# Patient Record
Sex: Female | Born: 1955
Health system: Southern US, Community
[De-identification: ages and names within clinical notes are randomized; demographics above are authoritative.]

## PROBLEM LIST (undated history)

## (undated) DIAGNOSIS — E079 Disorder of thyroid, unspecified: Secondary | ICD-10-CM

## (undated) DIAGNOSIS — D649 Anemia, unspecified: Secondary | ICD-10-CM

## (undated) DIAGNOSIS — Z9889 Other specified postprocedural states: Secondary | ICD-10-CM

## (undated) DIAGNOSIS — I493 Ventricular premature depolarization: Secondary | ICD-10-CM

## (undated) DIAGNOSIS — M1990 Unspecified osteoarthritis, unspecified site: Secondary | ICD-10-CM

## (undated) DIAGNOSIS — R112 Nausea with vomiting, unspecified: Secondary | ICD-10-CM

## (undated) DIAGNOSIS — M797 Fibromyalgia: Secondary | ICD-10-CM

## (undated) DIAGNOSIS — L309 Dermatitis, unspecified: Secondary | ICD-10-CM

## (undated) DIAGNOSIS — J45909 Unspecified asthma, uncomplicated: Secondary | ICD-10-CM

## (undated) DIAGNOSIS — E039 Hypothyroidism, unspecified: Secondary | ICD-10-CM

## (undated) DIAGNOSIS — C801 Malignant (primary) neoplasm, unspecified: Secondary | ICD-10-CM

## (undated) DIAGNOSIS — M810 Age-related osteoporosis without current pathological fracture: Secondary | ICD-10-CM

## (undated) DIAGNOSIS — G709 Myoneural disorder, unspecified: Secondary | ICD-10-CM

## (undated) DIAGNOSIS — E785 Hyperlipidemia, unspecified: Secondary | ICD-10-CM

## (undated) HISTORY — PX: APPENDECTOMY: SHX54

## (undated) HISTORY — DX: Unspecified asthma, uncomplicated: J45.909

## (undated) HISTORY — DX: Anemia, unspecified: D64.9

## (undated) HISTORY — DX: Unspecified osteoarthritis, unspecified site: M19.90

## (undated) HISTORY — PX: FOOT SURGERY: SHX648

## (undated) HISTORY — DX: Age-related osteoporosis without current pathological fracture: M81.0

## (undated) HISTORY — PX: BREAST SURGERY: SHX581

## (undated) HISTORY — DX: Myoneural disorder, unspecified: G70.9

## (undated) HISTORY — DX: Disorder of thyroid, unspecified: E07.9

## (undated) HISTORY — DX: Malignant (primary) neoplasm, unspecified: C80.1

## (undated) HISTORY — PX: OTHER SURGICAL HISTORY: SHX169

## (undated) HISTORY — DX: Hyperlipidemia, unspecified: E78.5

## (undated) HISTORY — DX: Dermatitis, unspecified: L30.9

## (undated) HISTORY — DX: Ventricular premature depolarization: I49.3

---

## 1988-02-03 DIAGNOSIS — C801 Malignant (primary) neoplasm, unspecified: Secondary | ICD-10-CM

## 1988-02-03 HISTORY — PX: BREAST SURGERY: SHX581

## 1988-02-03 HISTORY — DX: Malignant (primary) neoplasm, unspecified: C80.1

## 1997-11-14 ENCOUNTER — Other Ambulatory Visit: Admission: RE | Admit: 1997-11-14 | Discharge: 1997-11-14 | Payer: Self-pay | Admitting: Obstetrics and Gynecology

## 1998-10-28 ENCOUNTER — Other Ambulatory Visit: Admission: RE | Admit: 1998-10-28 | Discharge: 1998-10-28 | Payer: Self-pay | Admitting: Obstetrics and Gynecology

## 1999-03-24 ENCOUNTER — Ambulatory Visit (HOSPITAL_COMMUNITY): Admission: RE | Admit: 1999-03-24 | Discharge: 1999-03-24 | Payer: Self-pay | Admitting: Gastroenterology

## 2000-12-07 ENCOUNTER — Other Ambulatory Visit: Admission: RE | Admit: 2000-12-07 | Discharge: 2000-12-07 | Payer: Self-pay | Admitting: Obstetrics and Gynecology

## 2001-04-18 ENCOUNTER — Encounter: Admission: RE | Admit: 2001-04-18 | Discharge: 2001-04-18 | Payer: Self-pay | Admitting: Oncology

## 2001-10-28 ENCOUNTER — Ambulatory Visit (HOSPITAL_COMMUNITY): Admission: RE | Admit: 2001-10-28 | Discharge: 2001-10-28 | Payer: Self-pay | Admitting: Surgery

## 2001-10-28 ENCOUNTER — Encounter: Payer: Self-pay | Admitting: Surgery

## 2002-03-20 ENCOUNTER — Other Ambulatory Visit: Admission: RE | Admit: 2002-03-20 | Discharge: 2002-03-20 | Payer: Self-pay | Admitting: Obstetrics and Gynecology

## 2002-04-19 ENCOUNTER — Encounter: Admission: RE | Admit: 2002-04-19 | Discharge: 2002-04-19 | Payer: Self-pay | Admitting: Oncology

## 2002-11-21 ENCOUNTER — Encounter: Admission: RE | Admit: 2002-11-21 | Discharge: 2002-11-21 | Payer: Self-pay | Admitting: Obstetrics and Gynecology

## 2002-11-21 ENCOUNTER — Encounter: Payer: Self-pay | Admitting: Obstetrics and Gynecology

## 2003-04-18 ENCOUNTER — Encounter: Admission: RE | Admit: 2003-04-18 | Discharge: 2003-04-18 | Payer: Self-pay | Admitting: Oncology

## 2003-05-24 ENCOUNTER — Other Ambulatory Visit: Admission: RE | Admit: 2003-05-24 | Discharge: 2003-05-24 | Payer: Self-pay | Admitting: Obstetrics and Gynecology

## 2003-10-24 ENCOUNTER — Ambulatory Visit (HOSPITAL_COMMUNITY): Admission: RE | Admit: 2003-10-24 | Discharge: 2003-10-24 | Payer: Self-pay | Admitting: Family Medicine

## 2003-11-16 ENCOUNTER — Ambulatory Visit (HOSPITAL_COMMUNITY): Admission: RE | Admit: 2003-11-16 | Discharge: 2003-11-16 | Payer: Self-pay | Admitting: Family Medicine

## 2003-12-12 ENCOUNTER — Ambulatory Visit: Payer: Self-pay | Admitting: Internal Medicine

## 2004-03-14 ENCOUNTER — Encounter: Admission: RE | Admit: 2004-03-14 | Discharge: 2004-03-14 | Payer: Self-pay | Admitting: Obstetrics and Gynecology

## 2004-03-14 ENCOUNTER — Encounter: Admission: RE | Admit: 2004-03-14 | Discharge: 2004-03-14 | Payer: Self-pay | Admitting: Specialist

## 2005-02-03 ENCOUNTER — Encounter: Admission: RE | Admit: 2005-02-03 | Discharge: 2005-02-03 | Payer: Self-pay | Admitting: Oncology

## 2005-02-03 ENCOUNTER — Ambulatory Visit (HOSPITAL_COMMUNITY): Payer: Self-pay | Admitting: Oncology

## 2005-09-23 ENCOUNTER — Encounter: Admission: RE | Admit: 2005-09-23 | Discharge: 2005-09-23 | Payer: Self-pay | Admitting: Orthopedic Surgery

## 2006-01-28 ENCOUNTER — Encounter: Admission: RE | Admit: 2006-01-28 | Discharge: 2006-01-28 | Payer: Self-pay | Admitting: Rheumatology

## 2006-07-20 ENCOUNTER — Encounter: Admission: RE | Admit: 2006-07-20 | Discharge: 2006-07-20 | Payer: Self-pay | Admitting: Family Medicine

## 2006-07-28 ENCOUNTER — Encounter: Payer: Self-pay | Admitting: Obstetrics and Gynecology

## 2006-07-28 ENCOUNTER — Encounter (INDEPENDENT_AMBULATORY_CARE_PROVIDER_SITE_OTHER): Payer: Self-pay | Admitting: Surgery

## 2006-07-28 ENCOUNTER — Inpatient Hospital Stay (HOSPITAL_COMMUNITY): Admission: EM | Admit: 2006-07-28 | Discharge: 2006-07-30 | Payer: Self-pay | Admitting: Emergency Medicine

## 2007-04-02 ENCOUNTER — Encounter: Admission: RE | Admit: 2007-04-02 | Discharge: 2007-04-02 | Payer: Self-pay | Admitting: Orthopedic Surgery

## 2008-01-04 ENCOUNTER — Encounter: Admission: RE | Admit: 2008-01-04 | Discharge: 2008-01-04 | Payer: Self-pay | Admitting: Orthopedic Surgery

## 2010-02-23 ENCOUNTER — Encounter: Payer: Self-pay | Admitting: Rheumatology

## 2010-02-23 ENCOUNTER — Encounter: Payer: Self-pay | Admitting: Surgery

## 2010-06-17 NOTE — Op Note (Signed)
[name], [name]              ACCOUNT NO.:  [id number]   MEDICAL RECORD NO.:  [id number]          PATIENT TYPE:  INP   LOCATION:  5729                         FACILITY:  [facility]   PHYSICIAN:  Thornton Park. Daphine Deutscher, MD  DATE OF BIRTH:  January 13, 1956   DATE OF PROCEDURE:  07/28/2006  DATE OF DISCHARGE:                               OPERATIVE REPORT   PREOPERATIVE DIAGNOSIS:  Acute appendicitis   POSTOPERATIVE DIAGNOSIS:  Acute retrocecal appendicitis with perforation  in tip.   SURGEON:  Thornton Park. Daphine Deutscher, M.D.   ANESTHESIA:  General endotracheal.   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  I pulled this up on the screen in room  16 where we were operating and could see a fecalith and rim enhancement  of a thickened wall.  In retrospect, this was a retrocecal appendix.  I  entered the abdomen using Hassan technique through the umbilicus which  previously had scar from a laparoscopy.  An oblique 11 was placed  laterally, avoiding the tattoo in the left lower quadrant.  In the site  of the right upper quadrant trocar, there was a little mole that had  some irregularities so I went ahead and excised a little ellipse and  used that as my site for my 5 mm trocar.  The abdomen was entered and  insufflated.  In the cecal region, there was a bulging yellow density  extra retroperitoneal.  I found the base of the appendix with some  difficulty because of the degree of inflammation but went ahead and did  that and mobilized it and finally got an angled stapler across the base  and fired that.  I then elevated this, identifying the mesentery which I  transected with the harmonic scalpel.  As I teased it away, you could  see pus dripping out of the tip and I used the suction to control that  and aspirated so that it did not spill.  Once detached and freed up, I  placed it in a bag and brought it out through the umbilicus.  I  irrigated the base very well and did not see any evidence of any further  bleeding.  I used a couple of liters of saline to irrigate and removed  the irrigant from the pelvis as well as from around the liver.  I then  repaired the umbilical defect with two sutures of 0 zero Vicryl using  the 30 degree scope from the left lower quadrant port and repaired with  two sutures of 0 Vicryl.  I then surveyed the abdomen.  I deflated the  abdomen, removed the ports, and closed all wounds with 4-0 Vicryl  subcutaneously and subcuticularly.  Benzoin and Steri-Strips used the  skin.  The patient seemed to tolerate the procedure well  and was taken  to the recovery room in satisfactory condition.  She will be admitted  for observation.      Thornton Park Daphine Deutscher, MD  Electronically Signed  MBM/MEDQ  D:  07/28/2006  T:  07/29/2006  Job:  [id number]

## 2010-06-17 NOTE — H&P (Signed)
NAME:  Michelle Gray, Michelle Gray NO.:  [id number]   MEDICAL RECORD NO.:  [id number]          PATIENT TYPE:  INP   LOCATION:  2550                         FACILITY:  [facility]   PHYSICIAN:  Daphine Deutscher, M.D.           DATE OF BIRTH:  04/29/1955   DATE OF ADMISSION:  07/28/2006  DATE OF DISCHARGE:                              HISTORY & PHYSICAL   CHIEF COMPLAINT/REASON FOR ADMISSION:  Michelle Gray is a 55 year old female  patient complaining of right lower quadrant pain.   HISTORY OF PRESENT ILLNESS:  She presented to [facility] with the  abrupt onset of right lower quadrant pain, constant with colicky  features.  No fever.  No nausea and vomiting.  She had some Darvocet,  which she took that had no relief in her pain.  She had 2 bowel  movements this morning, which were normal.  She presented to the ER.  Evaluation there revealed a normal white count and a normal neutrophil  count, but because of her clinical presentation consistent with  appendicitis, a CT of the abdomen and pelvis was ordered and this was  consistent with acute appendicitis without evidence of abscess.  The  patient was subsequently transferred to [facility] ER with acceptance by Dr.  Daphine Deutscher for a diagnosis of appendicitis.   PAST MEDICAL HISTORY:  Right breast cancer.   PAST SURGICAL HISTORY:  A diagnostic laparoscopy 25 years ago and then a  right mastectomy with a subsequent implant 18 years ago, Dr. Daphine Deutscher did  the mastectomy.   FAMILY HISTORY:  Noncontributory.   SOCIAL HISTORY:  No tobacco.  Occasional social alcohol.  She works as a  Scientific laboratory technician.   DRUG ALLERGIES:  COMPAZINE, THIS CAUSED EXTRAPYRAMIDAL SYMPTOMS AND THIS  WAS NOTICED WHILE PATIENT WAS RECEIVING HIGH DOSES OF COMPAZINE DURING  HER CHEMOTHERAPY.   CURRENT MEDICATIONS:  Include:  1. Darvocet 1 every 6 hours as needed.  2. Ambien 10 mg as needed at bedtime.  3. Celebrex 200 mg daily as needed for arthritis  pain.  4. Multivitamins daily.   REVIEW OF SYSTEMS:  As per the history of present illness.   PHYSICAL EXAMINATION:  GENERAL:  She is a pleasant female patient,  currently complaining of significant right lower quadrant abdominal  pain.  VITAL SIGNS:  Temp 97.8.  BP 108/63.  Pulse 76.  Respirations 20.  NEURO:  Alert and oriented x3.  Moving all extremities x4 with no focal  deficits.  HEENT:  Head normocephalic.  Sclerae noninjected.  NECK:  Supple.  No adenopathy.  CHEST:  Bilateral lung fields clear to auscultation.  Respiratory effort  is nonlabored.  She is on room air.  CARDIAC:  S1, S2 without rubs, murmurs, thrills or gallops.  Pulse is  regular.  BREASTS EXAM:  Deferred.  ABDOMEN:  Soft, slightly distended.  Nontender.  Emergency room exam  stated that abdomen is soft, slightly distended, bowel sounds are  present.  She is tender in the right lower quadrant.  Both McBurney's  point with noted guarding and minimal rebounding.  EXTREMITIES:  Symmetrical in appearance without edema, cyanosis or  clubbing.  Pulses are palpable.   LAB AND X-RAY:  White count 9700, neutrophils 74%, hemoglobin 13.6,  platelets 272,000.  CT of abdomen and pelvis reveals acute appendicitis  without evidence of abscess/   IMPRESSION:  Acute appendicitis.   PLAN:  1. Admit to [facility].  2. Operative intervention this afternoon when operating room suite and      surgeon available.  Hopefully to follow the patient current in      progress per Dr. Daphine Deutscher.  She will undergo a laparoscopic      appendectomy, possible open procedure if indicated.  3. We will keep her n.p.o., begin IV fluid hydration, morphine for      pain, Zofran for nausea.  Begin empiric Zosyn antibiotic therapy.  4. Because the patient's age we will get a preop EKG and chest x-ray      and if she has not had a BMET done, we will check one of those.  5. Any additional recommendations per Dr. Daphine Deutscher.  Risks and benefits       have been further explained by Dr. Daphine Deutscher upon his arrival.      Revonda Standard L. Rolene Course.    ______________________________  Daphine Deutscher, M.D.    ALE/MEDQ  D:  07/28/2006  T:  07/28/2006  Job:  [id number]

## 2010-06-20 NOTE — Procedures (Signed)
[place]  Patient:    Michelle Gray, Michelle Gray                     MRN: [id number] Proc. Date: 03/24/99 Adm. Date:  [id number] Attending:  Rich Brave CC:         Elvina Sidle, M.D.             Charlaine Dalton. Sherene Sires, M.D. [facility]             Eric S. Mariel Sleet, M.D.                           Procedure Report  PROCEDURE PERFORMED:  Colonoscopy with biopsies.  ENDOSCOPIST:  Florencia Reasons, M.D.  INDICATIONS:  The patient is a 55 year old with family history of colon polyps nd personal history of breast cancer, who has had occasional rectal bleeding with bowel movements.  FINDINGS:  Diminutive cecal polyp.  DESCRIPTION OF PROCEDURE:  The nature, purpose and risks of the procedure were familiar to the patient from prior examinations and she provided written consent. This procedure was performed immediately following her upper endoscopy and total sedation for the two procedures was fentanyl 90 mcg and Versed 8 mg IV without arrhythmias or desaturation.  Digital exam and perianal inspection were unremarkable.  The Olympus adjustable  tension pediatric video colonoscope was advanced very easily around the colon to the cecum, using the tension device on the scope and a little bit of external abdominal compression to facilitate reaching the cecum.  Pullback was then performed.  The cecum was identified by clear visualization of the appendiceal orifice.  The quality of the prep was excellent and it is felt that all areas were adequately seen.  There was a 2 to 3 mm sessile polyp removed by a couple of cold biopsies in the  cecal region but no other polyps were seen and there was no evidence of cancer,  colitis, vascular malformations or diverticular disease.  Retroflexion in the rectum was attempted but could not readily be accomplished due to what appeared to be overlying valves of [place].  Antegrade viewing of the distal rectum  and anal canal, however, was unremarkable, with just mild to moderate hemorrhoidal columns being present.  The patient tolerated the procedure well.  There were no apparent complications.  IMPRESSION:  Diminutive cecal polyp, pathology pending.  PLAN:  Consider follow-up colonoscopy in five years. DD:  03/24/99 TD:  03/24/99 Job: [id number]

## 2010-06-20 NOTE — Procedures (Signed)
[place]  Patient:    Michelle Gray, Michelle Gray                     MRN: [id number] Proc. Date: 03/24/99 Adm. Date:  [id number] Attending:  Rich Brave CC:         Charlaine Dalton. Sherene Sires, M.D. [facility]             Elvina Sidle, M.D., [facility]             Ladona Horns. Mariel Sleet, M.D.                           Procedure Report  PROCEDURE:  Upper endoscopy.  INDICATION:  [age]-year-old with presumed laryngopharyngeal reflux as a complication of GERD, manifested by recurrent throat clearing.  No heartburn.  FINDINGS:  Essentially normal exam.  Intermittent hiatal hernia.  PROCEDURE:  The nature, purpose, and risks of the procedure had been discussed ith the patient, who provided written consent.  Sedation was fentanyl 90 mcg and versed 8 mg IV, without arrhythmias or desaturation.  The Olympus small-caliber adult videoendoscope was passed easily under direct vision.  The vocal cords looked normal.  I did not see any obvious laryngeal inflammatory changes grossly.  The  esophagus was easily entered, and had entirely normal mucosa, specifically, without evidence of reflux esophagitis, Barretts esophagus, varices, impaction, or neoplasia.  No gastroesophageal reflux was noted during the exam.  There was a spontaneously-reducing 1-2 cm hiatal hernia intermittently present during her exam.  The stomach was entered.  It contained no significant residual and had normal mucosa without evidence of gastritis, erosions, ulcers, polyps, or masses, including a ______ of the proximal stomach, which showed just a slight gap around the scope consistent with a small hiatal hernia.  The pylorus, duodenal bulb and sac, and duodenum looked normal.  The scope was removed from the patient.  No biopsies were obtained.  The patient tolerated the procedure well.  There were no apparent complications.  IMPRESSION:  Small hiatal hernia, otherwise  normal endoscopy, without obvious markers of reflux disease.  PLAN:  Follow-up per Dr. Sherene Sires for possible laryngopharyngeal reflux (LPR). DD:  03/24/99 TD:  03/24/99 Job: [id number]

## 2010-06-20 NOTE — Discharge Summary (Signed)
Michelle Gray, Michelle Gray              ACCOUNT NO.:  [id number]   MEDICAL RECORD NO.:  [id number]          PATIENT TYPE:  INP   LOCATION:  5729                         FACILITY:  [facility]   PHYSICIAN:  Thornton Park. Daphine Deutscher, MD  DATE OF BIRTH:  November 08, 1955   DATE OF ADMISSION:  07/28/2006  DATE OF DISCHARGE:  07/30/2006                               DISCHARGE SUMMARY   CHIEF COMPLAINT/REASON FOR ADMISSION:  [name] is a 50-year female  patient who developed right lower quadrant pain who presented initially  to [facility].  Pain has been constant with colicky features.  In  the ER at [facility], her white count was normal.  She had a  normal neutrophil count, but a CT scan revealed acute appendicitis  without evidence of abscess.  The patient was transferred over to [facility] and accepted by Dr. Daphine Deutscher. On clinical exam, the patient  was afebrile, vital signs were stable.  Her abdomen was soft, slightly  distended but nontender except for tenderness in the right lower  quadrant.  The patient was admitted with a diagnosis of acute  appendicitis.   HOSPITAL COURSE:  The patient was admitted and taken to the OR.  She had  been placed on empiric Zosyn preoperatively, and this was continued in  the postoperative period.  She subsequently underwent laparoscopic  appendectomy for perforated appendicitis.  Review of the OP notes and  the intraoperative radiographs reveal a small perforation at the base of  the appendix.   By postoperative day #2, the patient was febrile.  Vital signs were  stable.  Her most recent white count 24 hours before had been 13,100.  She was tolerating a solid diet.  Her pain was controlled, and the  patient reported that she wanted to go home.  Bowel sounds were present,  and she was only tender over the incision sites without any drainage.  She was deemed appropriate for discharge home on antibiotics but  recommended that she follow with Dr. Daphine Deutscher in  1 week.   DISCHARGE DIAGNOSIS:  Acute perforated appendicitis status post  laparoscopic appendectomy.   DISCHARGE MEDICATIONS:  1. Ambien 10 mg at hour of sleep.  2. Celebrex; hold while taking any ibuprofen.  3. Asthma inhaler.  4. Other medications:  Augmentin 875 mg b.i.d. for 7 days.  5. Refill on the albuterol metered-dose inhaler.  6. Ibuprofen 800 mg t.i.d. p.r.n. pain with food.  7. Darvocet-N 100 1-2 tablets every 4-6 hours as needed for pain.  8. Protonix or Prilosec over-the-counter while taking ibuprofen.   ADDITIONAL INSTRUCTIONS:  Increase activity slowly.  May walk up steps.  Shower for the next 2 weeks.  No lifting greater than 15 pounds for the  next 2 weeks.  No driving while taking any Percocet or Darvocet.  Return  to work no sooner than 2 weeks; note attached.  Diet:  No restrictions.  Wound care:  Allow Steri-Strips to fall off.  Followup:  You need to  call Dr. Ermalene Searing office to be seen in the next 7-10 days.   Additional instructions:  You  are to call the surgeon if you have fever  by mouth greater than 101 degrees Fahrenheit, new or increased belly  pain, nausea, vomiting, diarrhea or redness or drainage from your  wounds.      Allison L. Gwyneth Sprout Daphine Deutscher, MD  Electronically Signed    ALE/MEDQ  D:  10/12/2006  T:  10/13/2006  Job:  [id number]

## 2010-11-19 LAB — URINALYSIS, ROUTINE W REFLEX MICROSCOPIC
Hgb urine dipstick: NEGATIVE
Ketones, ur: NEGATIVE
Nitrite: NEGATIVE
Protein, ur: NEGATIVE
Specific Gravity, Urine: <1.005 — ABNORMAL LOW
pH: 5.5

## 2010-11-19 LAB — CBC
HCT: 40
Hemoglobin: 12.2
Hemoglobin: 13.6
MCHC: 34
Platelets: 272
RDW: 12.7
WBC: 9.7

## 2010-11-19 LAB — DIFFERENTIAL
Eosinophils Absolute: 0
Monocytes Absolute: 1.1 — ABNORMAL HIGH
Neutrophils Relative %: 74

## 2010-12-11 ENCOUNTER — Encounter: Payer: Self-pay | Admitting: Sports Medicine

## 2010-12-11 ENCOUNTER — Ambulatory Visit (INDEPENDENT_AMBULATORY_CARE_PROVIDER_SITE_OTHER): Payer: BC Managed Care – PPO | Admitting: Sports Medicine

## 2010-12-11 VITALS — BP 113/78 | HR 76 | Ht 67.5 in | Wt 170.0 lb

## 2010-12-11 DIAGNOSIS — M722 Plantar fascial fibromatosis: Secondary | ICD-10-CM

## 2010-12-11 DIAGNOSIS — M76899 Other specified enthesopathies of unspecified lower limb, excluding foot: Secondary | ICD-10-CM

## 2010-12-11 DIAGNOSIS — M7060 Trochanteric bursitis, unspecified hip: Secondary | ICD-10-CM

## 2010-12-11 DIAGNOSIS — G5700 Lesion of sciatic nerve, unspecified lower limb: Secondary | ICD-10-CM

## 2010-12-11 NOTE — Patient Instructions (Signed)
Please try increasing your gabapentin to 300 mg in the morning and 600 mg qhs for the next 3 days, then increase to 300 mg in the morning, 300 mg midday, and 600 mg qhs.  Try arch straps and using your orthotics   Start exercises for plantar fasciitis and hip  Please follow up in 1 month

## 2010-12-11 NOTE — Progress Notes (Signed)
Subjective:    Patient ID: Michelle Gray, female    DOB: 1956-02-03, 55 y.o.   MRN: [id number]  HPI  Ms. Fassler is a pleasant 55 yo female patient complaining of B/l plantar fascitis and L hip pain. She has a long Hx of B/L plantar fascitis for at least 7 year. She has had different modalities of treatment ,stretches, wave shock therapy, several cortisone injection in both feet, orthotics, surgical right gastroc release. Her pain is at the plantar aspect of the heel, B/L worse in the right heel, on and off, worse at evening time after she has been on her feet the whole day. She has tried different types of shoes and she uses the ones that are more comfortable for her. She is mobic and tylenol.  Also complaining of L hip pain for at least 2 years, she denies any injuries to her hip, she has seen a chiropractor in th past that helped her with her hip pain. Her pain is located in the posterior hip, in her left gluteal area, deep pain, dull, 3/10, on and off, worse with activities, no radiated, no numbness or tingling distally. No LBP. Also lateral hip pain in the great trochanter area this is a more recent pain.  There is no problem list on file for this patient.  No current outpatient prescriptions on file prior to visit.   Allergies  Allergen Reactions  . Compazine      Review of Systems  Constitutional: Negative for chills, diaphoresis and fatigue.  Musculoskeletal: Positive for gait problem. Negative for myalgias, back pain and joint swelling.  Skin: Negative for color change, pallor, rash and wound.  Neurological: Negative for weakness and numbness.       Objective:   Physical Exam  Constitutional: She is oriented to person, place, and time. She appears well-developed and well-nourished.       BP 113/78  Pulse 76  Ht 5' 7.5" (1.715 m)  Wt 170 lb (77.111 kg)  BMI 26.23 kg/m2   Pulmonary/Chest: Effort normal.  Musculoskeletal:       Low back with intact skin. No swelling, no  hematomas. FROM for flexion, extension, rotation and lateralization.   No Tenderness to palpation on SI joint area B/L. Faber test  Negative for SI joint pain. Straight leg raise negative B/L. Strength 5/5 for hip flexion and extension, 5/5 for knee flexion and extension, 5/5 for ankle plantar and dorsal flexion B/L. DTR patellar and achilles II/IV B/L Sensation intact distally B/L. No leg discrepancy.  Left hip with FROM . TTP in the posterior deep gluteal area. TTP on the trochanteric bursa. No limping. Sensation intact distally  No pain with resisted abduction.   Neurological: She is alert and oriented to person, place, and time.  Skin: Skin is warm. No rash noted. No erythema. No pallor.  Psychiatric: She has a normal mood and affect.     MSK U/S: with a swelling of PF B/L 0.6 cm B/L, bone spur in the right, calcification in the left.      Assessment & Plan:   1. Plantar fasciitis   2. Pyriformis syndrome   3. Trochanteric bursitis    Start gabapentin to 300 mg in the morning and 600 mg qhs for the next 3 days, then increase to 300 mg in the morning, 300 mg midday, and 600 mg qhs. Start arch straps and using your orthotics.  Start exercises for plantar fasciitis and hip. Ice massage in the plantar fascia. Deep  massage in the right gluteal area. Abductor strengthening exercises. We will can try trochanteric cortisone injection on her next visit if her symptoms do not improve.  Please follow up in 1 month

## 2011-01-12 ENCOUNTER — Ambulatory Visit (INDEPENDENT_AMBULATORY_CARE_PROVIDER_SITE_OTHER): Payer: BC Managed Care – PPO | Admitting: Sports Medicine

## 2011-01-12 ENCOUNTER — Other Ambulatory Visit: Payer: Self-pay | Admitting: *Deleted

## 2011-01-12 VITALS — BP 121/70

## 2011-01-12 DIAGNOSIS — M722 Plantar fascial fibromatosis: Secondary | ICD-10-CM | POA: Insufficient documentation

## 2011-01-12 DIAGNOSIS — M25559 Pain in unspecified hip: Secondary | ICD-10-CM

## 2011-01-12 DIAGNOSIS — M25552 Pain in left hip: Secondary | ICD-10-CM | POA: Insufficient documentation

## 2011-01-12 NOTE — Progress Notes (Signed)
  Subjective:    Patient ID: Michelle Gray, female    DOB: [birth year], 55 y.o.   MRN: [id number]  HPI  Pt presents to clinic for f/u of L hip pain which is 15-20% improved, and and bilat PF which is about the same.  On gabapentin and working on increasing to 600 mg during the day and 600 qhs.  Seeing improvement after doing suggested hip exercises.  Bilat PF- has not improved with HEP, icing, arch strap.  She gets relief with Birkie sandals that have a built in orthotic  We suggested a trial of orthotics in work shoes if not getting enough relief She does find arch strap helps Review of Systems     Objective:   Physical Exam  Lt hip exam: Lt hip in lying position- 70 deg ER, 30 deg IR Nml flexion lt hip  Hip flexion strong Abduction strong Hip rotation strong TTP over piriformis and greater troch TTP over upper glut medius  Rt great toe motion good No nodules TTP at PF insertion on medial side  Not tender over os calsus   Lt great toe motion good No nodules TTP at PF insertion on medial side Not tender over os calsus         Assessment & Plan:

## 2011-01-12 NOTE — Assessment & Plan Note (Signed)
Tough chronic problem Keep up stretches and exercises Wear new orthotics as much as tolerated  Patient was fitted for a standard, cushioned, semi-rigid orthotic.  The orthotic was heated and the patient stood on the orthotic blank positioned on the orthotic stand. The patient was positioned in subtalar neutral position and 10 degrees of ankle dorsiflexion in a weight bearing stance. After molding, a stable Fast-Tech EVA base was applied to the orthotic blank.   The blank was ground to a stable position for weight bearing. Size: 8 blue swirl base: Blue EVA posting: left first ray additional orthotic padding: none Time 40 mins  Reck P 3 mos

## 2011-01-12 NOTE — Assessment & Plan Note (Signed)
Keep up exercises  Try to cont on higher dose of gabapentin and we can increase if pain does not respond Walking gait improved on orthotic but watch turnout of RT foot

## 2011-06-18 ENCOUNTER — Encounter (INDEPENDENT_AMBULATORY_CARE_PROVIDER_SITE_OTHER): Payer: Self-pay

## 2011-10-15 ENCOUNTER — Other Ambulatory Visit: Payer: Self-pay | Admitting: Gastroenterology

## 2012-04-14 ENCOUNTER — Encounter (INDEPENDENT_AMBULATORY_CARE_PROVIDER_SITE_OTHER): Payer: Self-pay

## 2012-06-23 ENCOUNTER — Other Ambulatory Visit: Payer: Self-pay | Admitting: Dermatology

## 2013-04-17 ENCOUNTER — Telehealth: Payer: Self-pay | Admitting: Genetic Counselor

## 2013-04-17 NOTE — Telephone Encounter (Signed)
Patient left VM message that she wanted to cancel appointment.

## 2013-04-19 ENCOUNTER — Encounter: Payer: BC Managed Care – PPO | Admitting: Genetic Counselor

## 2013-04-19 ENCOUNTER — Other Ambulatory Visit: Payer: BC Managed Care – PPO

## 2013-05-24 ENCOUNTER — Encounter (INDEPENDENT_AMBULATORY_CARE_PROVIDER_SITE_OTHER): Payer: Self-pay

## 2013-05-24 ENCOUNTER — Telehealth: Payer: Self-pay | Admitting: Genetic Counselor

## 2013-05-24 NOTE — Telephone Encounter (Signed)
PER PATIENT WILL CALL BACK TO SCHEDULE GENETIC APPT

## 2013-05-24 NOTE — Telephone Encounter (Signed)
LEFT MESSAGE FOR PATIENT TO RETURN CALL TO SCHEDULE GENETIC APPT.  °

## 2013-07-19 ENCOUNTER — Other Ambulatory Visit: Payer: Self-pay | Admitting: Dermatology

## 2013-08-03 ENCOUNTER — Ambulatory Visit (INDEPENDENT_AMBULATORY_CARE_PROVIDER_SITE_OTHER): Payer: BC Managed Care – PPO | Admitting: Internal Medicine

## 2013-08-03 VITALS — BP 119/75 | HR 128 | Temp 98.8°F | Resp 18 | Ht 67.5 in | Wt 172.0 lb

## 2013-08-03 DIAGNOSIS — R509 Fever, unspecified: Secondary | ICD-10-CM

## 2013-08-03 LAB — POCT INFLUENZA A/B
INFLUENZA A, POC: NEGATIVE
Influenza B, POC: NEGATIVE

## 2013-08-03 MED ORDER — AZITHROMYCIN 250 MG PO TABS: ORAL_TABLET | ORAL | Status: DC

## 2013-08-03 MED ORDER — PREDNISONE 20 MG PO TABS: ORAL_TABLET | ORAL | Status: DC

## 2013-08-03 NOTE — Progress Notes (Signed)
   Subjective:    Patient ID: Loraine Grip, female    DOB: October 09, 1955, 58 y.o.   MRN: [id number]  Fever  Associated symptoms include coughing and headaches. Pertinent negatives include no abdominal pain, chest pain, ear pain, nausea or sore throat.  Cough Associated symptoms include a fever and headaches. Pertinent negatives include no chest pain, ear pain or sore throat.  Headache  Associated symptoms include coughing and a fever. Pertinent negatives include no abdominal pain, back pain, ear pain, nausea or sore throat.   Chief Complaint  Patient presents with  . Fever    x2 days   . Nasal Congestion  . Cough  . Headache  . Generalized Body Aches   She has underlying asthma and has started wheezing with this illness Fever to 102 She only has albuterol and has needed this  Review of Systems  Constitutional: Positive for fever. Negative for appetite change.  HENT: Negative for ear pain, mouth sores and sore throat.   Respiratory: Positive for cough.   Cardiovascular: Negative for chest pain and palpitations.  Gastrointestinal: Negative for nausea and abdominal pain.  Genitourinary: Negative for dysuria.  Musculoskeletal: Negative for back pain.  Skin:       No history of tick bite  Neurological: Positive for headaches.       Objective:   Physical Exam BP 119/75  Pulse 128  Temp(Src) 98.8 F (37.1 C) (Oral)  Resp 18  Ht 5' 7.5" (1.715 m)  Wt 172 lb (78.019 kg)  BMI 26.53 kg/m2  SpO2 95% Conjunctiva clear/TMs clear/nares with slight clear discharge Throat not injected No nodes Chest with wheezing on forced expiration bilaterally/no rales or rhonchi   Flu Swab negative    Assessment & Plan:   lower respiratory infection with reactive airway  Meds ordered this encounter  Medications  . azithromycin (ZITHROMAX) 250 MG tablet    Sig: As packaged    Dispense:  6 tablet    Refill:  0  . predniSONE (DELTASONE) 20 MG tablet    Sig: 3/3/2/2/1/1 single daily  dose for 6 days    Dispense:  12 tablet    Refill:  0   She has questionable association of glaucoma changes with Advair or Symbicort which resolved after discontinuing the inhaler/she will followup with ophthalmology while on steroids

## 2013-08-06 ENCOUNTER — Ambulatory Visit (HOSPITAL_COMMUNITY): Payer: BC Managed Care – PPO

## 2013-08-06 ENCOUNTER — Ambulatory Visit (INDEPENDENT_AMBULATORY_CARE_PROVIDER_SITE_OTHER): Payer: BC Managed Care – PPO

## 2013-08-06 ENCOUNTER — Ambulatory Visit (INDEPENDENT_AMBULATORY_CARE_PROVIDER_SITE_OTHER): Payer: BC Managed Care – PPO | Admitting: Internal Medicine

## 2013-08-06 VITALS — BP 128/72 | HR 114 | Temp 98.5°F | Resp 20 | Ht 67.0 in | Wt 174.4 lb

## 2013-08-06 DIAGNOSIS — J45909 Unspecified asthma, uncomplicated: Secondary | ICD-10-CM | POA: Insufficient documentation

## 2013-08-06 DIAGNOSIS — R071 Chest pain on breathing: Secondary | ICD-10-CM

## 2013-08-06 DIAGNOSIS — E785 Hyperlipidemia, unspecified: Secondary | ICD-10-CM

## 2013-08-06 DIAGNOSIS — R0602 Shortness of breath: Secondary | ICD-10-CM

## 2013-08-06 DIAGNOSIS — E039 Hypothyroidism, unspecified: Secondary | ICD-10-CM | POA: Insufficient documentation

## 2013-08-06 DIAGNOSIS — G4700 Insomnia, unspecified: Secondary | ICD-10-CM

## 2013-08-06 DIAGNOSIS — J4520 Mild intermittent asthma, uncomplicated: Secondary | ICD-10-CM

## 2013-08-06 LAB — CBC WITH DIFFERENTIAL/PLATELET
Basophils Absolute: 0 10*3/uL (ref 0.0–0.1)
Basophils Relative: 0 % (ref 0–1)
EOS ABS: 0 10*3/uL (ref 0.0–0.7)
EOS PCT: 0 % (ref 0–5)
HCT: 37.7 % (ref 36.0–46.0)
HEMOGLOBIN: 13.4 g/dL (ref 12.0–15.0)
Lymphocytes Relative: 8 % — ABNORMAL LOW (ref 12–46)
Lymphs Abs: 1.3 10*3/uL (ref 0.7–4.0)
MCH: 33.9 pg (ref 26.0–34.0)
MCHC: 35.5 g/dL (ref 30.0–36.0)
MCV: 95.4 fL (ref 78.0–100.0)
MONOS PCT: 11 % (ref 3–12)
Monocytes Absolute: 1.8 10*3/uL — ABNORMAL HIGH (ref 0.1–1.0)
NEUTROS PCT: 81 % — AB (ref 43–77)
Neutro Abs: 13.6 10*3/uL — ABNORMAL HIGH (ref 1.7–7.7)
PLATELETS: 391 10*3/uL (ref 150–400)
RBC: 3.95 MIL/uL (ref 3.87–5.11)
RDW: 13 % (ref 11.5–15.5)
WBC: 16.8 10*3/uL — ABNORMAL HIGH (ref 4.0–10.5)

## 2013-08-06 LAB — D-DIMER, QUANTITATIVE (NOT AT ARMC): D-Dimer, Quant: 0.46 ug/mL-FEU (ref 0.00–0.48)

## 2013-08-06 NOTE — Progress Notes (Signed)
Subjective:    Patient ID: Michelle Gray, female    DOB: October 28, 1955, 58 y.o.   MRN: [id number]  HPI This chart was scribed for Tami Lin, MD by Irene Pap, ED Scribe. This patient was seen in room 1 and the patient's care was started at 12:42 PM.  HPI Comments: Michelle Gray is a 58 y.o. female who presents to the [facility] complaining of cough and constant chest pain onset one day ago. Has previously been seen for the cough, but deep chest pain and tightness started last night.  She states that she would wake up due to the chest pain and start coughing after she rolls over in her sleep The pain is worsened by deep breathing and cough Reports some sputum with cough Last used Symbicort at 7, albuterol at 8 Reports SOB in last 24 hours Reports low grade fever at 99.8 F this morning Denies diaphoresis after sleep Denies SOB when walking Can tell when there's active wheezing after the inhalers wear off Reports some tachycardia, which she believes may be from albuterol Denies history of heart problems and HTN Has a history of GERD for which she has taken Nexium Reports having an EKG last fall that came back negative   Patient Active Problem List   Diagnosis Date Noted  . Unspecified hypothyroidism 08/06/2013  . Other and unspecified hyperlipidemia 08/06/2013  . Insomnia 08/06/2013  . Intrinsic asthma 08/06/2013  . Hip pain, left 01/12/2011  . Plantar fasciitis 01/12/2011     Past Medical History  Diagnosis Date  . Anemia   . Asthma   . Arthritis   . Cancer   . Hyperlipidemia   . Neuromuscular disorder   . Thyroid disease    Past Surgical History  Procedure Laterality Date  . Appendectomy    . Breast surgery     Allergies  Allergen Reactions  . Compazine    Prior to Admission medications   Medication Sig Start Date End Date Taking? Authorizing Provider  albuterol (PROVENTIL HFA;VENTOLIN HFA) 108 (90 BASE) MCG/ACT inhaler  Inhale 2 puffs into the lungs every 6 (six) hours as needed for wheezing or shortness of breath.   Yes Historical Provider, MD  atorvastatin (LIPITOR) 10 MG tablet Take 10 mg by mouth daily. 01/09/11  Yes Historical Provider, MD  azithromycin (ZITHROMAX) 250 MG tablet As packaged 08/03/13  Yes Leandrew Koyanagi, MD  Biotin 5000 MCG CAPS Take 5,000 mcg by mouth daily.   Yes Historical Provider, MD  Coenzyme Q10 (COQ10 PO) Take 1 tablet by mouth daily.     Yes Historical Provider, MD  folic acid (FOLVITE) 093 MCG tablet Take 400 mcg by mouth daily.   Yes Historical Provider, MD  gabapentin (NEURONTIN) 300 MG capsule Take 600 mg by mouth At bedtime. 11/15/10  Yes Historical Provider, MD  Glucosamine 500 MG TABS Take 1,500 mg by mouth daily.     Yes Historical Provider, MD  levothyroxine (SYNTHROID, LEVOTHROID) 50 MCG tablet Take 50 mcg by mouth every other day.     Yes Historical Provider, MD  levothyroxine (SYNTHROID, LEVOTHROID) 75 MCG tablet Take 75 mcg by mouth every other day.     Yes Historical Provider, MD  Magnesium 500 MG CAPS Take 500 mg by mouth daily.     Yes Historical Provider, MD  meloxicam (MOBIC) 15 MG tablet Take 15 mg by mouth At bedtime. 11/15/10  Yes Historical Provider, MD  methocarbamol (ROBAXIN) 500 MG tablet Take 500  mg by mouth as needed. 09/04/10  Yes Historical Provider, MD  Misc Natural Products (TART CHERRY ADVANCED PO) Take by mouth.   Yes Historical Provider, MD  montelukast (SINGULAIR) 10 MG tablet Take 10 mg by mouth at bedtime.   Yes Historical Provider, MD  Omega-3 Fatty Acids (OMEGA 3 PO) Take 300 mg by mouth daily.     Yes Historical Provider, MD  predniSONE (DELTASONE) 20 MG tablet 3/3/2/2/1/1 single daily dose for 6 days 08/03/13  Yes Leandrew Koyanagi, MD  rosuvastatin (CRESTOR) 10 MG tablet Take 10 mg by mouth 2 (two) times a week.   Yes Historical Provider, MD  zolpidem (AMBIEN) 10 MG tablet Take 10 mg by mouth At bedtime. 11/24/10  Yes Historical Provider, MD    History   Social History  . Marital Status: Single    Spouse Name: N/A    Number of Children: N/A  . Years of Education: N/A   Occupational History  . Not on file.   Social History Main Topics  . Smoking status: Never Smoker   . Smokeless tobacco: Never Used  . Alcohol Use: 1.5 - 2.0 oz/week    3-4 drink(s) per week  . Drug Use: No  . Sexual Activity: Not on file   Other Topics Concern  . Not on file   Social History Narrative  . No narrative on file      Review of Systems  Respiratory: Positive for cough, shortness of breath and wheezing.   Cardiovascular: Positive for chest pain.       Objective:   Physical Exam  Constitutional: She appears well-developed and well-nourished. No distress.  Neck: No thyromegaly present.  Cardiovascular: Normal rate, regular rhythm and normal heart sounds.  Exam reveals no gallop.   No murmur heard. Pulmonary/Chest: Breath sounds normal. She has no wheezes. She has no rales. She exhibits no tenderness.  Appears apprehensive when taking a deep breath and deep breathing produces deep chest pain anteriorly.   Musculoskeletal: She exhibits no edema.  Lymphadenopathy:    She has no cervical adenopathy.  BP 128/72  Pulse 114  Temp(Src) 98.5 F (36.9 C) (Oral)  Resp 20  Ht 5\' 7"  (1.702 m)  Wt 174 lb 6.4 oz (79.107 kg)  BMI 27.31 kg/m2  SpO2 98% Rate 75 on exam and 79 on EKG EKG-WNL   [facility] reading (PRIMARY) by  Dr. Moosa Bueche=R sided infiltr obscures R Ht Border=RML infiltr  Clips from node dissection 25 yrs ago  ?LLL sl infiltr as well       Assessment & Plan:  I have completed the patient encounter in its entirety as documented by the scribe, with editing by me where necessary. Jewell Haught P. Laney Pastor, M.D. Chest pain on breathing =pleuritic pain due to infection  Intrinsic asthma, mild intermittent, uncomplicated-w/ exacerbation due to illn  SOB (shortness of breath) - secondary   Will do cbc/d-dimer to assess  further Continue meds/add ibuprof    addend2130hrs D-dimer neg WBC 16,000- Called pt--no chg in plan/call me tues if not better

## 2016-01-03 ENCOUNTER — Ambulatory Visit: Payer: Self-pay | Admitting: Rheumatology

## 2016-02-06 DIAGNOSIS — L821 Other seborrheic keratosis: Secondary | ICD-10-CM | POA: Diagnosis not present

## 2016-02-06 DIAGNOSIS — D2262 Melanocytic nevi of left upper limb, including shoulder: Secondary | ICD-10-CM | POA: Diagnosis not present

## 2016-02-06 DIAGNOSIS — Z85828 Personal history of other malignant neoplasm of skin: Secondary | ICD-10-CM | POA: Diagnosis not present

## 2016-02-21 ENCOUNTER — Telehealth: Payer: Self-pay | Admitting: Rheumatology

## 2016-02-21 ENCOUNTER — Ambulatory Visit (INDEPENDENT_AMBULATORY_CARE_PROVIDER_SITE_OTHER): Payer: 59 | Admitting: Rheumatology

## 2016-02-21 ENCOUNTER — Encounter: Payer: Self-pay | Admitting: Rheumatology

## 2016-02-21 VITALS — BP 108/60 | HR 76 | Resp 12 | Ht 67.5 in | Wt 149.0 lb

## 2016-02-21 DIAGNOSIS — M797 Fibromyalgia: Secondary | ICD-10-CM

## 2016-02-21 DIAGNOSIS — M25561 Pain in right knee: Secondary | ICD-10-CM | POA: Diagnosis not present

## 2016-02-21 DIAGNOSIS — M19041 Primary osteoarthritis, right hand: Secondary | ICD-10-CM

## 2016-02-21 DIAGNOSIS — E785 Hyperlipidemia, unspecified: Secondary | ICD-10-CM | POA: Insufficient documentation

## 2016-02-21 DIAGNOSIS — M19042 Primary osteoarthritis, left hand: Secondary | ICD-10-CM

## 2016-02-21 DIAGNOSIS — F5101 Primary insomnia: Secondary | ICD-10-CM

## 2016-02-21 DIAGNOSIS — M25512 Pain in left shoulder: Secondary | ICD-10-CM | POA: Diagnosis not present

## 2016-02-21 DIAGNOSIS — J45909 Unspecified asthma, uncomplicated: Secondary | ICD-10-CM

## 2016-02-21 DIAGNOSIS — M810 Age-related osteoporosis without current pathological fracture: Secondary | ICD-10-CM | POA: Diagnosis not present

## 2016-02-21 DIAGNOSIS — Z8639 Personal history of other endocrine, nutritional and metabolic disease: Secondary | ICD-10-CM

## 2016-02-21 DIAGNOSIS — Z853 Personal history of malignant neoplasm of breast: Secondary | ICD-10-CM

## 2016-02-21 DIAGNOSIS — G8929 Other chronic pain: Secondary | ICD-10-CM

## 2016-02-21 DIAGNOSIS — M722 Plantar fascial fibromatosis: Secondary | ICD-10-CM

## 2016-02-21 MED ORDER — GABAPENTIN 300 MG PO CAPS: 600.0000 mg | ORAL_CAPSULE | Freq: Every day | ORAL | 1 refills | Status: DC

## 2016-02-21 MED ORDER — MELOXICAM 15 MG PO TABS: 15.0000 mg | ORAL_TABLET | Freq: Every day | ORAL | 1 refills | Status: DC

## 2016-02-21 MED ORDER — ZOLPIDEM TARTRATE 10 MG PO TABS: 10.0000 mg | ORAL_TABLET | Freq: Every evening | ORAL | 1 refills | Status: DC | PRN

## 2016-02-21 MED ORDER — METHOCARBAMOL 500 MG PO TABS: 500.0000 mg | ORAL_TABLET | Freq: Two times a day (BID) | ORAL | 1 refills | Status: DC | PRN

## 2016-02-21 NOTE — Telephone Encounter (Signed)
I have called in for her to [facility]

## 2016-02-21 NOTE — Progress Notes (Signed)
Office Visit Note  Patient: Michelle Gray             Date of Birth: 1955/04/23           MRN: [id number]             PCP: Cari Caraway, MD Referring: Cari Caraway, MD Visit Date: 02/21/2016 Occupation: @GUAROCC @    Subjective: left shoulder pain   History of Present Illness: Michelle Gray is a 61 y.o. female with history of fibromyalgia syndrome and osteoarthritis. She states she well off the stairs in October. After that she acquired left shoulder joint discomfort and had a cortisone injection by Dr. Lynann Bologna which is not better so far. She's been also having some discomfort in her right knee joint. She had a prednisone Dosepak which did not help her she is planning to get a cortisone injection to her right knee. The plantar fasciitis is the same. She's been diagnosed with osteoporosis recently and has a started taking Fosamax for that.  Activities of Daily Living:  Patient reports morning stiffness for 5 minutes.   Patient Reports nocturnal pain.  Difficulty dressing/grooming: Denies Difficulty climbing stairs: Reports Difficulty getting out of chair: Reports Difficulty using hands for taps, buttons, cutlery, and/or writing: Denies   Review of Systems  Constitutional: Negative for fatigue, night sweats, weight gain, weight loss and weakness.  HENT: Positive for mouth dryness and nose dryness. Negative for mouth sores, trouble swallowing and trouble swallowing.   Eyes: Positive for dryness. Negative for pain, redness and visual disturbance.  Respiratory: Negative for cough, shortness of breath and difficulty breathing.   Cardiovascular: Negative for chest pain, palpitations, hypertension, irregular heartbeat and swelling in legs/feet.  Gastrointestinal: Negative for blood in stool, constipation and diarrhea.  Endocrine: Negative for increased urination.  Genitourinary: Negative for vaginal dryness.  Musculoskeletal: Positive for arthralgias, joint pain, myalgias, morning  stiffness and myalgias. Negative for joint swelling, muscle weakness and muscle tenderness.  Skin: Negative for color change, rash, hair loss, skin tightness, ulcers and sensitivity to sunlight.  Allergic/Immunologic: Negative for susceptible to infections.  Neurological: Negative for dizziness, memory loss and night sweats.  Hematological: Negative for swollen glands.  Psychiatric/Behavioral: Positive for sleep disturbance. Negative for depressed mood. The patient is not nervous/anxious.     PMFS History:  Patient Active Problem List   Diagnosis Date Noted  . Primary osteoarthritis of both hands 02/21/2016  . Fibromyalgia 02/21/2016  . History of hypothyroidism 02/21/2016  . Dyslipidemia 02/21/2016  . History of breast cancer 02/21/2016  . Age-related osteoporosis without current pathological fracture 02/21/2016  . Unspecified hypothyroidism 08/06/2013  . Other and unspecified hyperlipidemia 08/06/2013  . Insomnia 08/06/2013  . Intrinsic asthma 08/06/2013  . Hip pain, left 01/12/2011  . Plantar fasciitis 01/12/2011    Past Medical History:  Diagnosis Date  . Anemia   . Arthritis   . Asthma   . Cancer ([place])   . Hyperlipidemia   . Neuromuscular disorder ([place])   . Thyroid disease     Family History  Problem Relation Age of Onset  . Cancer Mother   . Hyperlipidemia Mother   . Hypertension Mother   . Stroke Mother   . Hyperlipidemia Father   . Hypertension Brother    Past Surgical History:  Procedure Laterality Date  . APPENDECTOMY    . BREAST SURGERY     Social History   Social History Narrative  . No narrative on file     Objective: Vital Signs: BP  108/60 (BP Location: Left Arm, Patient Position: Sitting, Cuff Size: Normal)   Pulse 76   Resp 12   Ht 5' 7.5" (1.715 m)   Wt 149 lb (67.6 kg)   BMI 22.99 kg/m    Physical Exam  Constitutional: She is oriented to person, place, and time. She appears well-developed and well-nourished.  HENT:  Head:  Normocephalic and atraumatic.  Eyes: Conjunctivae and EOM are normal.  Neck: Normal range of motion.  Cardiovascular: Normal rate, regular rhythm, normal heart sounds and intact distal pulses.   Pulmonary/Chest: Effort normal and breath sounds normal.  Abdominal: Soft. Bowel sounds are normal.  Lymphadenopathy:    She has no cervical adenopathy.  Neurological: She is alert and oriented to person, place, and time.  Skin: Skin is warm and dry. Capillary refill takes less than 2 seconds.  Psychiatric: She has a normal mood and affect. Her behavior is normal.  Nursing note and vitals reviewed.    Musculoskeletal Exam: C-spine and thoracic lumbar spine good range of motion. She had discomfort with external rotation of her left shoulder. Elbow joints wrist joint MCPs PIPs DIPs with good range of motion with no synovitis. Hip joints knee joints ankles MTPs PIPs with good range of motion with no synovitis. She complains of some discomfort in her right knee. No synovitis was noted on exam. Fibromyalgia tender points were then out of 18 positive.  CDAI Exam: No CDAI exam completed.    Investigation: No additional findings.   Imaging: No results found.  Speciality Comments: No specialty comments available.    Procedures:  No procedures performed Allergies: Compazine   Assessment / Plan:     Visit Diagnoses: Fibromyalgia: She does have some generalized pain and discomfort from fibromyalgia. I will refill Neurontin and Robaxin which is been helpful.  Plantar fasciitis: Doing better with proper fitting shoes  Primary insomnia: She does have chronic insomnia which is better with Ambien.  Primary osteoarthritis of both hands: She is some discomfort in her bilateral hands.  Left shoulder joint tendinopathy: She had inadequate response to cortisone injection. She had some exercises from her previous physical therapy of advised her to do those exercises at home.  Right knee joint  discomfort: She gives history of some instability. She's been seeing Dr. Lynann Bologna for this. I've given her some knee joint exercises to try. She also takes Mobic for which she requests refills I will refill that today. She will get some labs to her PCPs office which include CBC and CMP to monitor for long-term medication use.  History of hypothyroidism  Dyslipidemia  Intrinsic asthma  History of breast cancer - 1990, status post right mastectomy and chemotherapy  Age-related osteoporosis without current pathological fracture - Fosamax by Dr. Benjie Karvonen    Orders: No orders of the defined types were placed in this encounter.  Meds ordered this encounter  Medications  . zolpidem (AMBIEN) 10 MG tablet    Sig: Take 1 tablet (10 mg total) by mouth at bedtime as needed for sleep.    Dispense:  90 tablet    Refill:  1  . methocarbamol (ROBAXIN) 500 MG tablet    Sig: Take 1 tablet (500 mg total) by mouth 2 (two) times daily as needed.    Dispense:  180 tablet    Refill:  1  . meloxicam (MOBIC) 15 MG tablet    Sig: Take 1 tablet (15 mg total) by mouth daily.    Dispense:  90 tablet  Refill:  1  . gabapentin (NEURONTIN) 300 MG capsule    Sig: Take 2 capsules (600 mg total) by mouth at bedtime.    Dispense:  180 capsule    Refill:  1    Face-to-face time spent with patient was 30 minutes. 50% of time was spent in counseling and coordination of care.  Follow-Up Instructions: Return in about 6 months (around 08/20/2016) for fibromyalgia, Osteoarthritis.   Bo Merino, MD  Note - This record has been created using Editor, commissioning.  Chart creation errors have been sought, but may not always  have been located. Such creation errors do not reflect on  the standard of medical care.

## 2016-02-21 NOTE — Patient Instructions (Signed)
Knee Exercises Ask your health care provider which exercises are safe for you. Do exercises exactly as told by your health care provider and adjust them as directed. It is normal to feel mild stretching, pulling, tightness, or discomfort as you do these exercises, but you should stop right away if you feel sudden pain or your pain gets worse.Do not begin these exercises until told by your health care provider. STRETCHING AND RANGE OF MOTION EXERCISES  These exercises warm up your muscles and joints and improve the movement and flexibility of your knee. These exercises also help to relieve pain, numbness, and tingling. Exercise A: Knee Extension, Prone 1. Lie on your abdomen on a bed. 2. Place your left / right knee just beyond the edge of the surface so your knee is not on the bed. You can put a towel under your left / right thigh just above your knee for comfort. 3. Relax your leg muscles and allow gravity to straighten your knee. You should feel a stretch behind your left / right knee. 4. Hold this position for __________ seconds. 5. Scoot up so your knee is supported between repetitions. Repeat __________ times. Complete this stretch __________ times a day. Exercise B: Knee Flexion, Active  1. Lie on your back with both knees straight. If this causes back discomfort, bend your left / right knee so your foot is flat on the floor. 2. Slowly slide your left / right heel back toward your buttocks until you feel a gentle stretch in the front of your knee or thigh. 3. Hold this position for __________ seconds. 4. Slowly slide your left / right heel back to the starting position. Repeat __________ times. Complete this exercise __________ times a day. Exercise C: Quadriceps, Prone  1. Lie on your abdomen on a firm surface, such as a bed or padded floor. 2. Bend your left / right knee and hold your ankle. If you cannot reach your ankle or pant leg, loop a belt around your foot and grab the belt  instead. 3. Gently pull your heel toward your buttocks. Your knee should not slide out to the side. You should feel a stretch in the front of your thigh and knee. 4. Hold this position for __________ seconds. Repeat __________ times. Complete this stretch __________ times a day. Exercise D: Hamstring, Supine 1. Lie on your back. 2. Loop a belt or towel over the ball of your left / right foot. The ball of your foot is on the walking surface, right under your toes. 3. Straighten your left / right knee and slowly pull on the belt to raise your leg until you feel a gentle stretch behind your knee.  Do not let your left / right knee bend while you do this.  Keep your other leg flat on the floor. 4. Hold this position for __________ seconds. Repeat __________ times. Complete this stretch __________ times a day. STRENGTHENING EXERCISES  These exercises build strength and endurance in your knee. Endurance is the ability to use your muscles for a long time, even after they get tired. Exercise E: Quadriceps, Isometric  1. Lie on your back with your left / right leg extended and your other knee bent. Put a rolled towel or small pillow under your knee if told by your health care provider. 2. Slowly tense the muscles in the front of your left / right thigh. You should see your kneecap slide up toward your hip or see increased dimpling just above the  knee. This motion will push the back of the knee toward the floor. 3. For __________ seconds, keep the muscle as tight as you can without increasing your pain. 4. Relax the muscles slowly and completely. Repeat __________ times. Complete this exercise __________ times a day. Exercise F: Straight Leg Raises - Quadriceps 1. Lie on your back with your left / right leg extended and your other knee bent. 2. Tense the muscles in the front of your left / right thigh. You should see your kneecap slide up or see increased dimpling just above the knee. Your thigh may  even shake a bit. 3. Keep these muscles tight as you raise your leg 4-6 inches (10-15 cm) off the floor. Do not let your knee bend. 4. Hold this position for __________ seconds. 5. Keep these muscles tense as you lower your leg. 6. Relax your muscles slowly and completely after each repetition. Repeat __________ times. Complete this exercise __________ times a day. Exercise G: Hamstring, Isometric 1. Lie on your back on a firm surface. 2. Bend your left / right knee approximately __________ degrees. 3. Dig your left / right heel into the surface as if you are trying to pull it toward your buttocks. Tighten the muscles in the back of your thighs to dig as hard as you can without increasing any pain. 4. Hold this position for __________ seconds. 5. Release the tension gradually and allow your muscles to relax completely for __________ seconds after each repetition. Repeat __________ times. Complete this exercise __________ times a day. Exercise H: Hamstring Curls  If told by your health care provider, do this exercise while wearing ankle weights. Begin with __________ weights. Then increase the weight by 1 lb (0.5 kg) increments. Do not wear ankle weights that are more than __________. 1. Lie on your abdomen with your legs straight. 2. Bend your left / right knee as far as you can without feeling pain. Keep your hips flat against the floor. 3. Hold this position for __________ seconds. 4. Slowly lower your leg to the starting position. Repeat __________ times. Complete this exercise __________ times a day. Exercise I: Squats (Quadriceps) 1. Stand in front of a table, with your feet and knees pointing straight ahead. You may rest your hands on the table for balance but not for support. 2. Slowly bend your knees and lower your hips like you are going to sit in a chair.  Keep your weight over your heels, not over your toes.  Keep your lower legs upright so they are parallel with the table  legs.  Do not let your hips go lower than your knees.  Do not bend lower than told by your health care provider.  If your knee pain increases, do not bend as low. 3. Hold the squat position for __________ seconds. 4. Slowly push with your legs to return to standing. Do not use your hands to pull yourself to standing. Repeat __________ times. Complete this exercise __________ times a day. Exercise J: Wall Slides (Quadriceps)  1. Lean your back against a smooth wall or door while you walk your feet out 18-24 inches (46-61 cm) from it. 2. Place your feet hip-width apart. 3. Slowly slide down the wall or door until your knees bend __________ degrees. Keep your knees over your heels, not over your toes. Keep your knees in line with your hips. 4. Hold for __________ seconds. Repeat __________ times. Complete this exercise __________ times a day. Exercise K: Straight Leg Raises -  Hip Abductors 1. Lie on your side with your left / right leg in the top position. Lie so your head, shoulder, knee, and hip line up. You may bend your bottom knee to help you keep your balance. 2. Roll your hips slightly forward so your hips are stacked directly over each other and your left / right knee is facing forward. 3. Leading with your heel, lift your top leg 4-6 inches (10-15 cm). You should feel the muscles in your outer hip lifting.  Do not let your foot drift forward.  Do not let your knee roll toward the ceiling. 4. Hold this position for __________ seconds. 5. Slowly return your leg to the starting position. 6. Let your muscles relax completely after each repetition. Repeat __________ times. Complete this exercise __________ times a day. Exercise L: Straight Leg Raises - Hip Extensors 1. Lie on your abdomen on a firm surface. You can put a pillow under your hips if that is more comfortable. 2. Tense the muscles in your buttocks and lift your left / right leg about 4-6 inches (10-15 cm). Keep your knee  straight as you lift your leg. 3. Hold this position for __________ seconds. 4. Slowly lower your leg to the starting position. 5. Let your leg relax completely after each repetition. Repeat __________ times. Complete this exercise __________ times a day. This information is not intended to replace advice given to you by your health care provider. Make sure you discuss any questions you have with your health care provider. Document Released: 12/03/2004 Document Revised: 10/14/2015 Document Reviewed: 11/25/2014 [employer] Interactive Patient Education  2017 [employer] Shoulder Exercises Ask your health care provider which exercises are safe for you. Do exercises exactly as told by your health care provider and adjust them as directed. It is normal to feel mild stretching, pulling, tightness, or discomfort as you do these exercises, but you should stop right away if you feel sudden pain or your pain gets worse.Do not begin these exercises until told by your health care provider. RANGE OF MOTION EXERCISES  These exercises warm up your muscles and joints and improve the movement and flexibility of your shoulder. These exercises also help to relieve pain, numbness, and tingling. These exercises involve stretching your injured shoulder directly. Exercise A: Pendulum  1. Stand near a wall or a surface that you can hold onto for balance. 2. Bend at the waist and let your left / right arm hang straight down. Use your other arm to support you. Keep your back straight and do not lock your knees. 3. Relax your left / right arm and shoulder muscles, and move your hips and your trunk so your left / right arm swings freely. Your arm should swing because of the motion of your body, not because you are using your arm or shoulder muscles. 4. Keep moving your body so your arm swings in the following directions, as told by your health care provider:  Side to side.  Forward and backward.  In clockwise and  counterclockwise circles. 5. Continue each motion for __________ seconds, or for as long as told by your health care provider. 6. Slowly return to the starting position. Repeat __________ times. Complete this exercise __________ times a day. Exercise B:Flexion, Standing  1. Stand and hold a broomstick, a cane, or a similar object. Place your hands a little more than shoulder-width apart on the object. Your left / right hand should be palm-up, and your other hand should be palm-down.  2. Keep your elbow straight and keep your shoulder muscles relaxed. Push the stick down with your healthy arm to raise your left / right arm in front of your body, and then over your head until you feel a stretch in your shoulder.  Avoid shrugging your shoulder while you raise your arm. Keep your shoulder blade tucked down toward the middle of your back. 3. Hold for __________ seconds. 4. Slowly return to the starting position. Repeat __________ times. Complete this exercise __________ times a day. Exercise C: Abduction, Standing 1. Stand and hold a broomstick, a cane, or a similar object. Place your hands a little more than shoulder-width apart on the object. Your left / right hand should be palm-up, and your other hand should be palm-down. 2. While keeping your elbow straight and your shoulder muscles relaxed, push the stick across your body toward your left / right side. Raise your left / right arm to the side of your body and then over your head until you feel a stretch in your shoulder.  Do not raise your arm above shoulder height, unless your health care provider tells you to do that.  Avoid shrugging your shoulder while you raise your arm. Keep your shoulder blade tucked down toward the middle of your back. 3. Hold for __________ seconds. 4. Slowly return to the starting position. Repeat __________ times. Complete this exercise __________ times a day. Exercise D:Internal Rotation  5. Place your left /  right hand behind your back, palm-up. 6. Use your other hand to dangle an exercise band, a towel, or a similar object over your shoulder. Grasp the band with your left / right hand so you are holding onto both ends. 7. Gently pull up on the band until you feel a stretch in the front of your left / right shoulder.  Avoid shrugging your shoulder while you raise your arm. Keep your shoulder blade tucked down toward the middle of your back. 8. Hold for __________ seconds. 9. Release the stretch by letting go of the band and lowering your hands. Repeat __________ times. Complete this exercise __________ times a day. STRETCHING EXERCISES  These exercises warm up your muscles and joints and improve the movement and flexibility of your shoulder. These exercises also help to relieve pain, numbness, and tingling. These exercises are done using your healthy shoulder to help stretch the muscles of your injured shoulder. Exercise E: Warehouse manager (External Rotation and Abduction)  1. Stand in a doorway with one of your feet slightly in front of the other. This is called a staggered stance. If you cannot reach your forearms to the door frame, stand facing a corner of a room. 2. Choose one of the following positions as told by your health care provider:  Place your hands and forearms on the door frame above your head.  Place your hands and forearms on the door frame at the height of your head.  Place your hands on the door frame at the height of your elbows. 3. Slowly move your weight onto your front foot until you feel a stretch across your chest and in the front of your shoulders. Keep your head and chest upright and keep your abdominal muscles tight. 4. Hold for __________ seconds. 5. To release the stretch, shift your weight to your back foot. Repeat __________ times. Complete this stretch __________ times a day. Exercise F:Extension, Standing 1. Stand and hold a broomstick, a cane, or a similar  object behind your back.  Your hands should be a little wider than shoulder-width apart.  Your palms should face away from your back. 2. Keeping your elbows straight and keeping your shoulder muscles relaxed, move the stick away from your body until you feel a stretch in your shoulder.  Avoid shrugging your shoulders while you move the stick. Keep your shoulder blade tucked down toward the middle of your back. 3. Hold for __________ seconds. 4. Slowly return to the starting position. Repeat __________ times. Complete this exercise __________ times a day. STRENGTHENING EXERCISES  These exercises build strength and endurance in your shoulder. Endurance is the ability to use your muscles for a long time, even after they get tired. Exercise G:External Rotation  6. Sit in a stable chair without armrests. 7. Secure an exercise band at elbow height on your left / right side. 8. Place a soft object, such as a folded towel or a small pillow, between your left / right upper arm and your body to move your elbow a few inches away (about 10 cm) from your side. 9. Hold the end of the band so it is tight and there is no slack. 10. Keeping your elbow pressed against the soft object, move your left / right forearm out, away from your abdomen. Keep your body steady so only your forearm moves. 11. Hold for __________ seconds. 12. Slowly return to the starting position. Repeat __________ times. Complete this exercise __________ times a day. Exercise H:Shoulder Abduction  1. Sit in a stable chair without armrests, or stand. 2. Hold a __________ weight in your left / right hand, or hold an exercise band with both hands. 3. Start with your arms straight down and your left / right palm facing in, toward your body. 4. Slowly lift your left / right hand out to your side. Do not lift your hand above shoulder height unless your health care provider tells you that this is safe.  Keep your arms straight.  Avoid  shrugging your shoulder while you do this movement. Keep your shoulder blade tucked down toward the middle of your back. 5. Hold for __________ seconds. 6. Slowly lower your arm, and return to the starting position. Repeat __________ times. Complete this exercise __________ times a day. Exercise I:Shoulder Extension 1. Sit in a stable chair without armrests, or stand. 2. Secure an exercise band to a stable object in front of you where it is at shoulder height. 3. Hold one end of the exercise band in each hand. Your palms should face each other. 4. Straighten your elbows and lift your hands up to shoulder height. 5. Step back, away from the secured end of the exercise band, until the band is tight and there is no slack. 6. Squeeze your shoulder blades together as you pull your hands down to the sides of your thighs. Stop when your hands are straight down by your sides. Do not let your hands go behind your body. 7. Hold for __________ seconds. 8. Slowly return to the starting position. Repeat __________ times. Complete this exercise __________ times a day. Exercise J:Standing Shoulder Row 5. Sit in a stable chair without armrests, or stand. 6. Secure an exercise band to a stable object in front of you so it is at waist height. 7. Hold one end of the exercise band in each hand. Your palms should be in a thumbs-up position. 8. Bend each of your elbows to an "L" shape (about 90 degrees) and keep your upper arms at your sides.  9. Step back until the band is tight and there is no slack. 10. Slowly pull your elbows back behind you. 11. Hold for __________ seconds. 12. Slowly return to the starting position. Repeat __________ times. Complete this exercise __________ times a day. Exercise K:Shoulder Press-Ups  7. Sit in a stable chair that has armrests. Sit upright, with your feet flat on the floor. 8. Put your hands on the armrests so your elbows are bent and your fingers are pointing forward.  Your hands should be about even with the sides of your body. 9. Push down on the armrests and use your arms to lift yourself off of the chair. Straighten your elbows and lift yourself up as much as you comfortably can.  Move your shoulder blades down, and avoid letting your shoulders move up toward your ears.  Keep your feet on the ground. As you get stronger, your feet should support less of your body weight as you lift yourself up. 10. Hold for __________ seconds. 11. Slowly lower yourself back into the chair. Repeat __________ times. Complete this exercise __________ times a day. Exercise L: Wall Push-Ups  6. Stand so you are facing a stable wall. Your feet should be about one arm-length away from the wall. 7. Lean forward and place your palms on the wall at shoulder height. 8. Keep your feet flat on the floor as you bend your elbows and lean forward toward the wall. 9. Hold for __________ seconds. 10. Straighten your elbows to push yourself back to the starting position. Repeat __________ times. Complete this exercise __________ times a day. This information is not intended to replace advice given to you by your health care provider. Make sure you discuss any questions you have with your health care provider. Document Released: 12/03/2004 Document Revised: 10/14/2015 Document Reviewed: 09/30/2014 Elsevier Interactive Patient Education  2017 [employer].

## 2016-02-21 NOTE — Telephone Encounter (Signed)
Patient would like to ensure all of the prescriptions from this morning were sent to [facility] (90 day supply of each). She received a call from [facility] stating the Ambien was ready for pick up and it was supposed to be sent to [facility] along with the other 3. Please resend.

## 2016-02-25 DIAGNOSIS — E7800 Pure hypercholesterolemia, unspecified: Secondary | ICD-10-CM | POA: Diagnosis not present

## 2016-02-25 DIAGNOSIS — Z79899 Other long term (current) drug therapy: Secondary | ICD-10-CM | POA: Diagnosis not present

## 2016-03-09 DIAGNOSIS — E782 Mixed hyperlipidemia: Secondary | ICD-10-CM | POA: Diagnosis not present

## 2016-03-09 DIAGNOSIS — E039 Hypothyroidism, unspecified: Secondary | ICD-10-CM | POA: Diagnosis not present

## 2016-03-09 DIAGNOSIS — Z23 Encounter for immunization: Secondary | ICD-10-CM | POA: Diagnosis not present

## 2016-03-09 DIAGNOSIS — J45909 Unspecified asthma, uncomplicated: Secondary | ICD-10-CM | POA: Diagnosis not present

## 2016-05-13 DIAGNOSIS — Z853 Personal history of malignant neoplasm of breast: Secondary | ICD-10-CM | POA: Diagnosis not present

## 2016-05-13 DIAGNOSIS — Z1231 Encounter for screening mammogram for malignant neoplasm of breast: Secondary | ICD-10-CM | POA: Diagnosis not present

## 2016-07-20 DIAGNOSIS — Z79899 Other long term (current) drug therapy: Secondary | ICD-10-CM | POA: Diagnosis not present

## 2016-07-20 DIAGNOSIS — Z1321 Encounter for screening for nutritional disorder: Secondary | ICD-10-CM | POA: Diagnosis not present

## 2016-07-20 DIAGNOSIS — E7800 Pure hypercholesterolemia, unspecified: Secondary | ICD-10-CM | POA: Diagnosis not present

## 2016-08-18 NOTE — Progress Notes (Signed)
Office Visit Note  Patient: Michelle Gray             Date of Birth: Jun 04, 1955           MRN: [id number]             PCP: Cari Caraway, MD Referring: Cari Caraway, MD Visit Date: 08/20/2016 Occupation: @GUAROCC @    Subjective:  Fibromyalgia (Doing good, follow up)   History of Present Illness: Michelle Gray is a 61 y.o. female with history of fibromyalgia and osteoarthritis. She states she has had a very stressful last few months. She lost her mother in March and her father had a stroke who is recovering from that. She continues to have some problems with plantar fasciitis she's been doing his stretches and wearing proper fitting shoes. Her left shoulder is doing better. Her right knee joint is improved also. She continues to have discomfort in her hands especially her bilateral thumbs. Which is related to her work.  Activities of Daily Living:  Patient reports morning stiffness for 2 minutes.   Patient Reports nocturnal pain.  Difficulty dressing/grooming: Denies Difficulty climbing stairs: Reports Difficulty getting out of chair: Denies Difficulty using hands for taps, buttons, cutlery, and/or writing: Denies   Review of Systems  Constitutional: Positive for fatigue. Negative for night sweats, weight gain, weight loss and weakness.  HENT: Positive for mouth dryness. Negative for mouth sores, trouble swallowing, trouble swallowing and nose dryness.   Eyes: Positive for dryness. Negative for pain, redness and visual disturbance.  Respiratory: Positive for shortness of breath. Negative for cough and difficulty breathing.        H/o asthma  Cardiovascular: Negative for chest pain, palpitations, hypertension, irregular heartbeat and swelling in legs/feet.  Gastrointestinal: Negative for blood in stool, constipation and diarrhea.  Endocrine: Negative for increased urination.  Genitourinary: Negative for vaginal dryness.  Musculoskeletal: Positive for arthralgias, joint  pain, myalgias, morning stiffness and myalgias. Negative for joint swelling, muscle weakness and muscle tenderness.  Skin: Negative for color change, rash, hair loss, skin tightness, ulcers and sensitivity to sunlight.  Allergic/Immunologic: Negative for susceptible to infections.  Neurological: Negative for dizziness, memory loss and night sweats.  Hematological: Negative for swollen glands.  Psychiatric/Behavioral: Positive for sleep disturbance. Negative for depressed mood. The patient is not nervous/anxious.     PMFS History:  Patient Active Problem List   Diagnosis Date Noted  . Primary osteoarthritis of both hands 02/21/2016  . Fibromyalgia 02/21/2016  . History of hypothyroidism 02/21/2016  . Dyslipidemia 02/21/2016  . History of breast cancer 02/21/2016  . Age-related osteoporosis without current pathological fracture 02/21/2016  . Unspecified hypothyroidism 08/06/2013  . Other and unspecified hyperlipidemia 08/06/2013  . Insomnia 08/06/2013  . Intrinsic asthma 08/06/2013  . Hip pain, left 01/12/2011  . Plantar fasciitis 01/12/2011    Past Medical History:  Diagnosis Date  . Anemia   . Arthritis   . Asthma   . Cancer ([place])   . Hyperlipidemia   . Neuromuscular disorder ([place])   . Osteoporosis   . Thyroid disease     Family History  Problem Relation Age of Onset  . Cancer Mother   . Hyperlipidemia Mother   . Hypertension Mother   . Stroke Mother   . Dementia Mother   . Hyperlipidemia Father   . Stroke Father   . Hypertension Brother    Past Surgical History:  Procedure Laterality Date  . APPENDECTOMY    . BREAST SURGERY    .  FOOT SURGERY    . LAPROSCOPIC    . WISDOM TEETH REMOVAL     Social History   Social History Narrative  . No narrative on file     Objective: Vital Signs: BP (!) 105/57 (BP Location: Left Arm, Patient Position: Sitting, Cuff Size: Normal)   Pulse 73   Resp 14   Ht 5' 7.5" (1.715 m)   Wt 149 lb (67.6 kg)   BMI 22.99 kg/m     Physical Exam  Constitutional: She is oriented to person, place, and time. She appears well-developed and well-nourished.  HENT:  Head: Normocephalic and atraumatic.  Eyes: Conjunctivae and EOM are normal.  Neck: Normal range of motion.  Cardiovascular: Normal rate, regular rhythm, normal heart sounds and intact distal pulses.   Pulmonary/Chest: Effort normal and breath sounds normal.  Abdominal: Soft. Bowel sounds are normal.  Lymphadenopathy:    She has no cervical adenopathy.  Neurological: She is alert and oriented to person, place, and time.  Skin: Skin is warm and dry. Capillary refill takes less than 2 seconds.  Psychiatric: She has a normal mood and affect. Her behavior is normal.  Nursing note and vitals reviewed.    Musculoskeletal Exam: C-spine and thoracic lumbar spine good range of motion. Shoulder joints elbow joints wrist joints are good range of motion. She has some some DIP thickening with discomfort in her bilateral CMC joints. Hip joints knee joints ankles MTPs PIPs DIPs with good range of motion. She is some tenderness over bilateral trochanteric bursa area she also has tenderness over plantar fascia consistent with plantar fasciitis.  CDAI Exam: No CDAI exam completed.    Investigation: No additional findings.  07/20/2016 CMP normal, TSH normal, CBC normal, vitamin D 71.2, lipid panel LDL 107 Imaging: No results found.  Speciality Comments: No specialty comments available.    Procedures:  No procedures performed Allergies: Compazine   Assessment / Plan:     Visit Diagnoses: Fibromyalgia: She continues to have some generalized pain although she did not have any tender points.  Primary insomnia: She does have chronic insomnia which is controlled with Ambien and Neurontin combination.  Other fatigue: She's been having increased fatigue due to increased stress lately. She lost her mother in March and her father had a stroke.   Plantar fasciitis:  Plantar fasciitis continues to be a problem for which exercise which are constricted office. Her pain symptoms are better with the Mobic and we will continue Mobic right now. She got her labs with her today her labs have been stable.  Trochanteric bursitis of both hips: She continues to have some problems with trochanteric bursitis which causes nocturnal pain. ITB and exercise were demonstrated in the office today.  Shoulder tendonitis, left: Symptoms have improved.  Primary osteoarthritis of both hands: She does have increased discomfort which is partly work-related.  Chronic pain of right knee: Doing better  Age-related osteoporosis without current pathological fracture. She has a new diagnosis of osteoporosis and started on Boniva after trying Fosamax. She had GI side effects from Fosamax. - Boniva by Dr. Benjie Karvonen.  Dyslipidemia: She is on a statin drugs.  History of breast cancer  History of hypothyroidism  History of hyperlipidemia    Orders: No orders of the defined types were placed in this encounter.  Meds ordered this encounter  Medications  . zolpidem (AMBIEN) 10 MG tablet    Sig: Take 1 tablet (10 mg total) by mouth at bedtime as needed for sleep.  Dispense:  90 tablet    Refill:  1  . meloxicam (MOBIC) 15 MG tablet    Sig: Take 1 tablet (15 mg total) by mouth daily.    Dispense:  90 tablet    Refill:  1  . gabapentin (NEURONTIN) 300 MG capsule    Sig: Take 2 capsules (600 mg total) by mouth at bedtime.    Dispense:  180 capsule    Refill:  1    .  Follow-Up Instructions: Return in about 6 months (around 02/20/2017) for FMS.   Bo Merino, MD  Note - This record has been created using Editor, commissioning.  Chart creation errors have been sought, but may not always  have been located. Such creation errors do not reflect on  the standard of medical care.

## 2016-08-19 DIAGNOSIS — R51 Headache: Secondary | ICD-10-CM | POA: Diagnosis not present

## 2016-08-20 ENCOUNTER — Ambulatory Visit (INDEPENDENT_AMBULATORY_CARE_PROVIDER_SITE_OTHER): Payer: 59 | Admitting: Rheumatology

## 2016-08-20 ENCOUNTER — Encounter: Payer: Self-pay | Admitting: Rheumatology

## 2016-08-20 VITALS — BP 105/57 | HR 73 | Resp 14 | Ht 67.5 in | Wt 149.0 lb

## 2016-08-20 DIAGNOSIS — Z8639 Personal history of other endocrine, nutritional and metabolic disease: Secondary | ICD-10-CM | POA: Diagnosis not present

## 2016-08-20 DIAGNOSIS — M810 Age-related osteoporosis without current pathological fracture: Secondary | ICD-10-CM

## 2016-08-20 DIAGNOSIS — Z853 Personal history of malignant neoplasm of breast: Secondary | ICD-10-CM

## 2016-08-20 DIAGNOSIS — E785 Hyperlipidemia, unspecified: Secondary | ICD-10-CM

## 2016-08-20 DIAGNOSIS — F5101 Primary insomnia: Secondary | ICD-10-CM | POA: Diagnosis not present

## 2016-08-20 DIAGNOSIS — M7061 Trochanteric bursitis, right hip: Secondary | ICD-10-CM

## 2016-08-20 DIAGNOSIS — M25561 Pain in right knee: Secondary | ICD-10-CM | POA: Diagnosis not present

## 2016-08-20 DIAGNOSIS — M7062 Trochanteric bursitis, left hip: Secondary | ICD-10-CM

## 2016-08-20 DIAGNOSIS — G8929 Other chronic pain: Secondary | ICD-10-CM

## 2016-08-20 DIAGNOSIS — M7582 Other shoulder lesions, left shoulder: Secondary | ICD-10-CM

## 2016-08-20 DIAGNOSIS — M722 Plantar fascial fibromatosis: Secondary | ICD-10-CM | POA: Diagnosis not present

## 2016-08-20 DIAGNOSIS — M19042 Primary osteoarthritis, left hand: Secondary | ICD-10-CM

## 2016-08-20 DIAGNOSIS — M797 Fibromyalgia: Secondary | ICD-10-CM

## 2016-08-20 DIAGNOSIS — R5383 Other fatigue: Secondary | ICD-10-CM

## 2016-08-20 DIAGNOSIS — M19041 Primary osteoarthritis, right hand: Secondary | ICD-10-CM | POA: Diagnosis not present

## 2016-08-20 DIAGNOSIS — M778 Other enthesopathies, not elsewhere classified: Secondary | ICD-10-CM

## 2016-08-20 MED ORDER — MELOXICAM 15 MG PO TABS: 15.0000 mg | ORAL_TABLET | Freq: Every day | ORAL | 1 refills | Status: DC

## 2016-08-20 MED ORDER — GABAPENTIN 300 MG PO CAPS
600.0000 mg | ORAL_CAPSULE | Freq: Every day | ORAL | 1 refills | Status: DC
Start: 2016-08-20 — End: 2017-04-26

## 2016-08-20 MED ORDER — ZOLPIDEM TARTRATE 10 MG PO TABS: 10.0000 mg | ORAL_TABLET | Freq: Every evening | ORAL | 1 refills | Status: DC | PRN

## 2016-09-25 DIAGNOSIS — Z23 Encounter for immunization: Secondary | ICD-10-CM | POA: Diagnosis not present

## 2016-09-25 DIAGNOSIS — J45909 Unspecified asthma, uncomplicated: Secondary | ICD-10-CM | POA: Diagnosis not present

## 2016-09-25 DIAGNOSIS — E782 Mixed hyperlipidemia: Secondary | ICD-10-CM | POA: Diagnosis not present

## 2016-09-25 DIAGNOSIS — E039 Hypothyroidism, unspecified: Secondary | ICD-10-CM | POA: Diagnosis not present

## 2016-09-29 ENCOUNTER — Telehealth: Payer: Self-pay | Admitting: *Deleted

## 2016-09-29 NOTE — Telephone Encounter (Signed)
Labs received from [place] [facility] drawn on 07/20/16  CBC/CMP WNL  Vitamin D 71.2

## 2016-11-12 DIAGNOSIS — N6324 Unspecified lump in the left breast, lower inner quadrant: Secondary | ICD-10-CM | POA: Diagnosis not present

## 2016-11-12 DIAGNOSIS — Z853 Personal history of malignant neoplasm of breast: Secondary | ICD-10-CM | POA: Diagnosis not present

## 2016-11-12 DIAGNOSIS — N6322 Unspecified lump in the left breast, upper inner quadrant: Secondary | ICD-10-CM | POA: Diagnosis not present

## 2017-02-01 ENCOUNTER — Other Ambulatory Visit: Payer: Self-pay | Admitting: Rheumatology

## 2017-02-01 NOTE — Telephone Encounter (Addendum)
Last Visit: 08/20/16 Next Visit: 02/23/17 Labs: 02/03/17 Potassium elevated   Okay to refill per Dr. Estanislado Pandy

## 2017-02-03 DIAGNOSIS — E7800 Pure hypercholesterolemia, unspecified: Secondary | ICD-10-CM | POA: Diagnosis not present

## 2017-02-03 DIAGNOSIS — Z79899 Other long term (current) drug therapy: Secondary | ICD-10-CM | POA: Diagnosis not present

## 2017-02-03 DIAGNOSIS — E039 Hypothyroidism, unspecified: Secondary | ICD-10-CM | POA: Diagnosis not present

## 2017-02-03 DIAGNOSIS — Z131 Encounter for screening for diabetes mellitus: Secondary | ICD-10-CM | POA: Diagnosis not present

## 2017-02-03 DIAGNOSIS — Z130 Encounter for screening for diseases of the blood and blood-forming organs and certain disorders involving the immune mechanism: Secondary | ICD-10-CM | POA: Diagnosis not present

## 2017-02-05 ENCOUNTER — Telehealth: Payer: Self-pay | Admitting: Rheumatology

## 2017-02-05 DIAGNOSIS — Z01419 Encounter for gynecological examination (general) (routine) without abnormal findings: Secondary | ICD-10-CM | POA: Diagnosis not present

## 2017-02-05 DIAGNOSIS — Z6823 Body mass index (BMI) 23.0-23.9, adult: Secondary | ICD-10-CM | POA: Diagnosis not present

## 2017-02-05 NOTE — Telephone Encounter (Signed)
Labs received and left message to advise patient of lab results and that prescription has been sent to pharmacy.

## 2017-02-05 NOTE — Telephone Encounter (Signed)
Patient left a message to let us know that she completed her labs.  She would like a prescription refill of Mobic.  Her pharmacy is [facility].  CB#  [phone]

## 2017-02-09 NOTE — Progress Notes (Signed)
Office Visit Note  Patient: Michelle Gray             Date of Birth: [birth year]           MRN: [id number]             PCP: Cari Caraway, MD Referring: Cari Caraway, MD Visit Date: 02/23/2017 Occupation: @GUAROCC @    Subjective:  Fibromyalgia (doing good )   History of Present Illness: Michelle Gray is a 62 y.o. female with history of fibromyalgia, osteoarthritis. She states she continues to have some generalized discomfort from fibromyalgia but is not increased. She still has lot of discomfort in her feet from plantar fasciitis. She's been taking Mobic for relief. She has some stiffness in her hands from underlying osteoarthritis and she has discomfort in her hands. She also has some lower back discomfort. She does have some discomfort in her right knee specialty with kneeling. She denies any joint swelling.  Activities of Daily Living:  Patient reports morning stiffness for 0  minute.   Patient Denies nocturnal pain.  Difficulty dressing/grooming: Denies Difficulty climbing stairs: Denies Difficulty getting out of chair: Denies Difficulty using hands for taps, buttons, cutlery, and/or writing: Reports   Review of Systems  Constitutional: Negative for fatigue, night sweats, weight gain, weight loss and weakness.  HENT: Positive for mouth dryness. Negative for mouth sores, trouble swallowing, trouble swallowing and nose dryness.   Eyes: Positive for dryness. Negative for pain, redness and visual disturbance.  Respiratory: Negative for cough, shortness of breath and difficulty breathing.   Cardiovascular: Negative for chest pain, palpitations, hypertension, irregular heartbeat and swelling in legs/feet.  Gastrointestinal: Negative for blood in stool, constipation and diarrhea.  Endocrine: Negative for increased urination.  Genitourinary: Negative for vaginal dryness.  Musculoskeletal: Positive for arthralgias, joint pain, myalgias, morning stiffness and myalgias. Negative  for joint swelling, muscle weakness and muscle tenderness.  Skin: Negative for color change, rash, hair loss, skin tightness, ulcers and sensitivity to sunlight.  Allergic/Immunologic: Negative for susceptible to infections.  Neurological: Negative for dizziness, memory loss and night sweats.  Hematological: Negative for swollen glands.  Psychiatric/Behavioral: Positive for sleep disturbance. Negative for depressed mood. The patient is not nervous/anxious.     PMFS History:  Patient Active Problem List   Diagnosis Date Noted  . Primary osteoarthritis of both hands 02/21/2016  . Fibromyalgia 02/21/2016  . History of hypothyroidism 02/21/2016  . Dyslipidemia 02/21/2016  . History of breast cancer 02/21/2016  . Age-related osteoporosis without current pathological fracture 02/21/2016  . Unspecified hypothyroidism 08/06/2013  . Other and unspecified hyperlipidemia 08/06/2013  . Insomnia 08/06/2013  . Intrinsic asthma 08/06/2013  . Hip pain, left 01/12/2011  . Plantar fasciitis 01/12/2011    Past Medical History:  Diagnosis Date  . Anemia   . Arthritis   . Asthma   . Cancer ([place])   . Hyperlipidemia   . Neuromuscular disorder ([place])   . Osteoporosis   . Thyroid disease     Family History  Problem Relation Age of Onset  . Cancer Mother   . Hyperlipidemia Mother   . Hypertension Mother   . Stroke Mother   . Dementia Mother   . Hyperlipidemia Father   . Stroke Father   . Hypertension Brother    Past Surgical History:  Procedure Laterality Date  . APPENDECTOMY    . BREAST SURGERY    . FOOT SURGERY    . LAPROSCOPIC    . WISDOM TEETH REMOVAL  Social History   Social History Narrative  . Not on file     Objective: Vital Signs: BP 103/62 (BP Location: Left Arm, Patient Position: Sitting, Cuff Size: Normal)   Pulse 74   Resp 15   Ht 5' 7.5" (1.715 m)   Wt 147 lb (66.7 kg)   BMI 22.68 kg/m    Physical Exam  Constitutional: She is oriented to person, place, and  time. She appears well-developed and well-nourished.  HENT:  Head: Normocephalic and atraumatic.  Eyes: Conjunctivae and EOM are normal.  Neck: Normal range of motion.  Cardiovascular: Normal rate, regular rhythm, normal heart sounds and intact distal pulses.  Pulmonary/Chest: Effort normal and breath sounds normal.  Abdominal: Soft. Bowel sounds are normal.  Lymphadenopathy:    She has no cervical adenopathy.  Neurological: She is alert and oriented to person, place, and time.  Skin: Skin is warm and dry. Capillary refill takes less than 2 seconds.  Psychiatric: She has a normal mood and affect. Her behavior is normal.  Nursing note and vitals reviewed.    Musculoskeletal Exam: C-spine and thoracic lumbar spine good range of motion. Shoulder joints elbow joints wrist joints are good range of motion. She has mild DIP joint changes in her hands. She has thickening of bilateral first MCP joint due to osteoarthritis. Mild subluxation of left first MCP was noted. Hip joints are good range of motion. She has tenderness over bilateral trochanteric area. She had no crepitus warmth or swelling in her knee joints. She has some osteoarthritic changes in her feet discomfort in the left first MTP joint.  CDAI Exam: No CDAI exam completed.    Investigation: No additional findings.   Imaging: No results found.  Speciality Comments: No specialty comments available.    Procedures:  No procedures performed Allergies: Compazine   Assessment / Plan:     Visit Diagnoses: Fibromyalgia: She continues to have some generalized pain and discomfort. She is trying regular exercise and good sleep hygiene.  Primary insomnia: She continues to have insomnia and is controlled with Ambien. We discussed decreasing dose of Ambien to 5 mg. She states she's tried it in the past and it did not work. She'll try to future again.  Other fatigue: Related to chronic insomnia.  Primary osteoarthritis of both hands:  Joint protection and muscle strengthening was discussed.  DDD (degenerative disc disease), lumbar - L5-S1 disc disease on MRI 2008, facet joint arthropathy. She has chronic back pain. Core strengthening exercises were demonstrated and discussed in office today.  Plantar fasciitis - Her pain symptoms are better with the Mobic and we will continue Mobic right now. Proper fitting shoes with arch support were discussed. She had labs in January 2019 which were reviewed. Her CBC and CMP was within normal limits.  Age-related osteoporosis without current pathological fracture -  She has a new diagnosis of osteoporosis and started on Boniva after trying Fosamax. She had GI side effects from Fosamax. - Boniva by Dr. Benjie Karvonen  Other medical problems are listed as follows:  History of breast cancer  Dyslipidemia - She is on a statin drugs.  History of hypothyroidism  History of hyperlipidemia    Orders: No orders of the defined types were placed in this encounter.  Meds ordered this encounter  Medications  . zolpidem (AMBIEN) 10 MG tablet    Sig: Take 1 tablet (10 mg total) by mouth at bedtime.    Dispense:  90 tablet    Refill:  1  Face-to-face time spent with patient was 30  minutes.  greater than50% of time was spent in counseling and coordination of care.  Follow-Up Instructions: Return in about 6 months (around 08/23/2017) for FMS, DDD, , Osteoarthritis.   Bo Merino, MD  Note - This record has been created using Editor, commissioning.  Chart creation errors have been sought, but may not always  have been located. Such creation errors do not reflect on  the standard of medical care.

## 2017-02-23 ENCOUNTER — Encounter: Payer: Self-pay | Admitting: Rheumatology

## 2017-02-23 ENCOUNTER — Ambulatory Visit: Payer: 59 | Admitting: Rheumatology

## 2017-02-23 VITALS — BP 103/62 | HR 74 | Resp 15 | Ht 67.5 in | Wt 147.0 lb

## 2017-02-23 DIAGNOSIS — M19042 Primary osteoarthritis, left hand: Secondary | ICD-10-CM | POA: Diagnosis not present

## 2017-02-23 DIAGNOSIS — M19041 Primary osteoarthritis, right hand: Secondary | ICD-10-CM

## 2017-02-23 DIAGNOSIS — M5136 Other intervertebral disc degeneration, lumbar region: Secondary | ICD-10-CM | POA: Diagnosis not present

## 2017-02-23 DIAGNOSIS — M810 Age-related osteoporosis without current pathological fracture: Secondary | ICD-10-CM

## 2017-02-23 DIAGNOSIS — Z8639 Personal history of other endocrine, nutritional and metabolic disease: Secondary | ICD-10-CM

## 2017-02-23 DIAGNOSIS — M797 Fibromyalgia: Secondary | ICD-10-CM

## 2017-02-23 DIAGNOSIS — Z853 Personal history of malignant neoplasm of breast: Secondary | ICD-10-CM

## 2017-02-23 DIAGNOSIS — R5383 Other fatigue: Secondary | ICD-10-CM

## 2017-02-23 DIAGNOSIS — M722 Plantar fascial fibromatosis: Secondary | ICD-10-CM

## 2017-02-23 DIAGNOSIS — E785 Hyperlipidemia, unspecified: Secondary | ICD-10-CM | POA: Diagnosis not present

## 2017-02-23 DIAGNOSIS — F5101 Primary insomnia: Secondary | ICD-10-CM

## 2017-02-23 DIAGNOSIS — M51369 Other intervertebral disc degeneration, lumbar region without mention of lumbar back pain or lower extremity pain: Secondary | ICD-10-CM

## 2017-02-23 MED ORDER — ZOLPIDEM TARTRATE 10 MG PO TABS: 10.0000 mg | ORAL_TABLET | Freq: Every day | ORAL | 1 refills | Status: DC

## 2017-03-25 DIAGNOSIS — D2262 Melanocytic nevi of left upper limb, including shoulder: Secondary | ICD-10-CM | POA: Diagnosis not present

## 2017-03-25 DIAGNOSIS — Z85828 Personal history of other malignant neoplasm of skin: Secondary | ICD-10-CM | POA: Diagnosis not present

## 2017-03-25 DIAGNOSIS — D225 Melanocytic nevi of trunk: Secondary | ICD-10-CM | POA: Diagnosis not present

## 2017-03-25 DIAGNOSIS — D485 Neoplasm of uncertain behavior of skin: Secondary | ICD-10-CM | POA: Diagnosis not present

## 2017-03-25 DIAGNOSIS — D2261 Melanocytic nevi of right upper limb, including shoulder: Secondary | ICD-10-CM | POA: Diagnosis not present

## 2017-03-29 DIAGNOSIS — E039 Hypothyroidism, unspecified: Secondary | ICD-10-CM | POA: Diagnosis not present

## 2017-03-29 DIAGNOSIS — E782 Mixed hyperlipidemia: Secondary | ICD-10-CM | POA: Diagnosis not present

## 2017-03-29 DIAGNOSIS — J45909 Unspecified asthma, uncomplicated: Secondary | ICD-10-CM | POA: Diagnosis not present

## 2017-04-13 DIAGNOSIS — H40013 Open angle with borderline findings, low risk, bilateral: Secondary | ICD-10-CM | POA: Diagnosis not present

## 2017-04-23 DIAGNOSIS — I788 Other diseases of capillaries: Secondary | ICD-10-CM | POA: Diagnosis not present

## 2017-04-26 ENCOUNTER — Other Ambulatory Visit: Payer: Self-pay | Admitting: Rheumatology

## 2017-04-26 ENCOUNTER — Telehealth: Payer: Self-pay | Admitting: Rheumatology

## 2017-04-26 MED ORDER — MELOXICAM 15 MG PO TABS: 15.0000 mg | ORAL_TABLET | Freq: Every day | ORAL | 0 refills | Status: DC

## 2017-04-26 NOTE — Telephone Encounter (Signed)
Last Visit: 02/23/17 Next Visit: 08/26/17 Labs: 02/03/17 WNL  Okay to refill per Dr. Estanislado Pandy

## 2017-04-26 NOTE — Telephone Encounter (Signed)
Last Visit: 02/23/17 Next Visit: 08/26/17  Okay to refill per Dr. Deveshwar  

## 2017-04-26 NOTE — Telephone Encounter (Signed)
Patient calling requesting a refill on Mobic, and Gabapentin for 90 day supply to [facility] in [place].

## 2017-07-05 ENCOUNTER — Encounter: Payer: Self-pay | Admitting: Rheumatology

## 2017-07-05 DIAGNOSIS — E039 Hypothyroidism, unspecified: Secondary | ICD-10-CM | POA: Diagnosis not present

## 2017-07-05 DIAGNOSIS — E7800 Pure hypercholesterolemia, unspecified: Secondary | ICD-10-CM | POA: Diagnosis not present

## 2017-07-05 DIAGNOSIS — Z130 Encounter for screening for diseases of the blood and blood-forming organs and certain disorders involving the immune mechanism: Secondary | ICD-10-CM | POA: Diagnosis not present

## 2017-07-05 DIAGNOSIS — Z79899 Other long term (current) drug therapy: Secondary | ICD-10-CM | POA: Diagnosis not present

## 2017-08-03 ENCOUNTER — Other Ambulatory Visit: Payer: Self-pay | Admitting: Rheumatology

## 2017-08-04 ENCOUNTER — Telehealth: Payer: Self-pay | Admitting: Rheumatology

## 2017-08-04 NOTE — Telephone Encounter (Signed)
Patient called requesting prescription refill of Neurontin to be sent to [facility] in [place].  Patient is requesting a 3 month supply due to being less expensive to order.

## 2017-08-04 NOTE — Telephone Encounter (Signed)
Patient advised prescription has been sent to the pharmacy.  

## 2017-08-04 NOTE — Telephone Encounter (Signed)
Last Visit: 02/23/17 Next Visit: 08/26/17  Okay to refill per Dr. Estanislado Pandy

## 2017-08-13 NOTE — Progress Notes (Signed)
Office Visit Note  Patient: Michelle Gray             Date of Birth: 04/08/1955           MRN: [id number]             PCP: Cari Caraway, MD Referring: Cari Caraway, MD Visit Date: 08/26/2017 Occupation: @GUAROCC @  Subjective:  Right elbow pain.   History of Present Illness: Michelle Gray is a 62 y.o. female with history of fibromyalgia osteoarthritis and osteoporosis.  She states she has been lifting weights and has been having some discomfort over the right epicondyle area.  She continues to have some discomfort with plantar fasciitis.  The lower back pain persist.  Activities of Daily Living:  Patient reports morning stiffness for a couple of  minutes.   Patient Reports nocturnal pain.  Difficulty dressing/grooming: Denies Difficulty climbing stairs: Denies Difficulty getting out of chair: Denies Difficulty using hands for taps, buttons, cutlery, and/or writing: Reports  Review of Systems  Constitutional: Positive for fatigue. Negative for night sweats, weight gain and weight loss.  HENT: Positive for mouth dryness. Negative for mouth sores, trouble swallowing, trouble swallowing and nose dryness.   Eyes: Negative for pain, redness, visual disturbance and dryness.  Respiratory: Negative for cough, shortness of breath and difficulty breathing.   Cardiovascular: Negative for chest pain, palpitations, hypertension, irregular heartbeat and swelling in legs/feet.  Gastrointestinal: Negative for abdominal pain, blood in stool, constipation and diarrhea.  Endocrine: Negative for increased urination.  Genitourinary: Negative for pelvic pain and vaginal dryness.  Musculoskeletal: Positive for arthralgias and joint pain. Negative for joint swelling, myalgias, muscle weakness, morning stiffness, muscle tenderness and myalgias.  Skin: Negative for color change, rash, hair loss, skin tightness, ulcers and sensitivity to sunlight.  Allergic/Immunologic: Negative for susceptible to  infections.  Neurological: Negative for dizziness, light-headedness, headaches, memory loss, night sweats and weakness.  Hematological: Negative for bruising/bleeding tendency and swollen glands.  Psychiatric/Behavioral: Negative for depressed mood, confusion and sleep disturbance. The patient is not nervous/anxious.     PMFS History:  Patient Active Problem List   Diagnosis Date Noted  . Primary osteoarthritis of both hands 02/21/2016  . Fibromyalgia 02/21/2016  . History of hypothyroidism 02/21/2016  . Dyslipidemia 02/21/2016  . History of breast cancer 02/21/2016  . Age-related osteoporosis without current pathological fracture 02/21/2016  . Unspecified hypothyroidism 08/06/2013  . Other and unspecified hyperlipidemia 08/06/2013  . Insomnia 08/06/2013  . Intrinsic asthma 08/06/2013  . Hip pain, left 01/12/2011  . Plantar fasciitis 01/12/2011    Past Medical History:  Diagnosis Date  . Anemia   . Arthritis   . Asthma   . Cancer ([place])   . Hyperlipidemia   . Neuromuscular disorder ([place])   . Osteoporosis   . Thyroid disease     Family History  Problem Relation Age of Onset  . Cancer Mother   . Hyperlipidemia Mother   . Hypertension Mother   . Stroke Mother   . Dementia Mother   . Hyperlipidemia Father   . Stroke Father   . Hypertension Brother    Past Surgical History:  Procedure Laterality Date  . APPENDECTOMY    . BREAST SURGERY    . FOOT SURGERY    . LAPROSCOPIC    . WISDOM TEETH REMOVAL     Social History   Social History Narrative  . Not on file    Objective: Vital Signs: BP 109/71 (BP Location: Left Arm, Patient Position: Sitting,  Cuff Size: Normal)   Pulse 82   Resp 12   Ht 5' 7.5" (1.715 m)   Wt 141 lb (64 kg)   BMI 21.76 kg/m    Physical Exam  Constitutional: She is oriented to person, place, and time. She appears well-developed and well-nourished.  HENT:  Head: Normocephalic and atraumatic.  Eyes: Conjunctivae and EOM are normal.  Neck:  Normal range of motion.  Cardiovascular: Normal rate, regular rhythm, normal heart sounds and intact distal pulses.  Pulmonary/Chest: Effort normal and breath sounds normal.  Abdominal: Soft. Bowel sounds are normal.  Lymphadenopathy:    She has no cervical adenopathy.  Neurological: She is alert and oriented to person, place, and time.  Skin: Skin is warm and dry. Capillary refill takes less than 2 seconds.  Psychiatric: She has a normal mood and affect. Her behavior is normal.  Nursing note and vitals reviewed.    Musculoskeletal Exam: C-spine thoracic lumbar spine good range of motion.  She has some discomfort range of motion of the lumbar spine.  Shoulder joints elbow joints wrist joint MCPs PIPs DIPs were in good range of motion.  She has tenderness on palpation of her right lateral epicondyle.  Hip joints knee joints ankles MTPs PIPs were in good range of motion.  CDAI Exam: No CDAI exam completed.   Investigation: No additional findings.  Imaging: No results found.  Recent Labs: Lab Results  Component Value Date   WBC 16.8 (H) 08/06/2013   HGB 13.4 08/06/2013   PLT 391 08/06/2013    Speciality Comments: No specialty comments available.  Procedures:  No procedures performed Allergies: Compazine   Assessment / Plan:     Visit Diagnoses: Fibromyalgia-her fibromyalgia symptoms are fairly well controlled currently with medications and exercise.  Other fatigue-she continues to have some fatigue.  Primary insomnia-she has been on Ambien for insomnia.  Lateral epicondylitis of right elbow-she has been having increased discomfort over the right lateral epicondyle.  It is related to weight bearing exercises she has been doing.  A prescription for Voltaren gel was called in which she can use topically.  Side effects were reviewed.  Right forearm exercises were given.  Primary osteoarthritis of both hands-joint protection muscle strengthening was discussed.  DDD  (degenerative disc disease), lumbar - L5-S1 disc disease on MRI 2008, facet joint arthropathy.  She has chronic pain in her lower back.  Chronic NSAID use-patient states that she had CBC and CMP recently which was within normal limits.  Age-related osteoporosis without current pathological fracture - She has a new diagnosis of osteoporosis and started on Boniva after trying Fosamax. She had GI side effects from Fosamax and Boniva by Dr. Benjie Karvonen  Plantar fasciitis-she does have chronic discomfort in her feet from plantar fasciitis.  History of hyperlipidemia  Dyslipidemia - She is on a statin drugs.  History of breast cancer  History of hypothyroidism   Orders: No orders of the defined types were placed in this encounter.  Meds ordered this encounter  Medications  . diclofenac sodium (VOLTAREN) 1 % GEL    Sig: Apply 3 gm to 3 large joints up to 3 times a day.Dispense 3 tubes with 3 refills.    Dispense:  3 Tube    Refill:  0  . zolpidem (AMBIEN) 10 MG tablet    Sig: Take 1 tablet (10 mg total) by mouth at bedtime.    Dispense:  90 tablet    Refill:  1  . meloxicam (MOBIC) 15 MG  tablet    Sig: Take 1 tablet (15 mg total) by mouth daily.    Dispense:  90 tablet    Refill:  1    Face-to-face time spent with patient was 30 minutes. Greater than 50% of time was spent in counseling and coordination of care.  Follow-Up Instructions: Return in about 6 months (around 02/26/2018) for FMS, Osteoarthritis, Osteoporosis.   Bo Merino, MD  Note - This record has been created using Editor, commissioning.  Chart creation errors have been sought, but may not always  have been located. Such creation errors do not reflect on  the standard of medical care.

## 2017-08-26 ENCOUNTER — Ambulatory Visit: Payer: 59 | Admitting: Rheumatology

## 2017-08-26 ENCOUNTER — Encounter: Payer: Self-pay | Admitting: Rheumatology

## 2017-08-26 VITALS — BP 109/71 | HR 82 | Resp 12 | Ht 67.5 in | Wt 141.0 lb

## 2017-08-26 DIAGNOSIS — F5101 Primary insomnia: Secondary | ICD-10-CM

## 2017-08-26 DIAGNOSIS — M7711 Lateral epicondylitis, right elbow: Secondary | ICD-10-CM

## 2017-08-26 DIAGNOSIS — Z853 Personal history of malignant neoplasm of breast: Secondary | ICD-10-CM

## 2017-08-26 DIAGNOSIS — R5383 Other fatigue: Secondary | ICD-10-CM | POA: Diagnosis not present

## 2017-08-26 DIAGNOSIS — M810 Age-related osteoporosis without current pathological fracture: Secondary | ICD-10-CM

## 2017-08-26 DIAGNOSIS — M797 Fibromyalgia: Secondary | ICD-10-CM

## 2017-08-26 DIAGNOSIS — M5136 Other intervertebral disc degeneration, lumbar region: Secondary | ICD-10-CM

## 2017-08-26 DIAGNOSIS — M722 Plantar fascial fibromatosis: Secondary | ICD-10-CM

## 2017-08-26 DIAGNOSIS — M19041 Primary osteoarthritis, right hand: Secondary | ICD-10-CM

## 2017-08-26 DIAGNOSIS — M51369 Other intervertebral disc degeneration, lumbar region without mention of lumbar back pain or lower extremity pain: Secondary | ICD-10-CM

## 2017-08-26 DIAGNOSIS — E785 Hyperlipidemia, unspecified: Secondary | ICD-10-CM

## 2017-08-26 DIAGNOSIS — Z8639 Personal history of other endocrine, nutritional and metabolic disease: Secondary | ICD-10-CM

## 2017-08-26 DIAGNOSIS — M19042 Primary osteoarthritis, left hand: Secondary | ICD-10-CM

## 2017-08-26 MED ORDER — DICLOFENAC SODIUM 1 % TD GEL: TRANSDERMAL | 0 refills | Status: DC

## 2017-08-26 MED ORDER — MELOXICAM 15 MG PO TABS: 15.0000 mg | ORAL_TABLET | Freq: Every day | ORAL | 1 refills | Status: DC

## 2017-08-26 MED ORDER — ZOLPIDEM TARTRATE 10 MG PO TABS: 10.0000 mg | ORAL_TABLET | Freq: Every day | ORAL | 1 refills | Status: DC

## 2017-08-26 NOTE — Patient Instructions (Signed)
Elbow and Forearm Exercises Ask your health care provider which exercises are safe for you. Do exercises exactly as told by your health care provider and adjust them as directed. It is normal to feel mild stretching, pulling, tightness, or discomfort as you do these exercises, but you should stop right away if you feel sudden pain or your pain gets worse.Do not begin these exercises until told by your health care provider. RANGE OF MOTION EXERCISES These exercises warm up your muscles and joints and improve the movement and flexibility of your injured elbow and forearm. These exercises also help to relieve pain, numbness, and tingling.These exercises are done using the muscles in your injured elbow and forearm. Exercise A: Elbow Flexion, Active 1. Hold your left / right arm at your side, and bend your elbow as far as you can using your left / right arm muscles. 2. Hold this position for __________ seconds. 3. Slowly return to the starting position. Repeat __________ times. Complete this exercise __________ times a day. Exercise B: Elbow Extension, Active 1. Hold your left / right arm at your side, and straighten your elbow as much as you can using your left / right arm muscles. 2. Hold this position for __________ seconds. 3. Slowly return to the starting position. Repeat __________ times. Complete this exercise __________ times a day. Exercise C: Forearm Rotation, Supination, Active 1. Stand or sit with your elbows at your sides. 2. Bend your left / right elbow to an "L" shape (90 degrees). 3. Turn your palm upward until you feel a gentle stretch on the inside of your forearm. 4. Hold this position for __________ seconds. 5. Slowly release and return to the starting position. Repeat __________ times. Complete this exercise __________ times a day. Exercise D: Forearm Rotation, Pronation, Active 1. Stand or sit with your elbows at your side. 2. Bend your left / right elbow to an "L" shape (90  degrees). 3. Turn your left / right palm downward until you feel a gentle stretch on the top of your forearm. 4. Hold this position for __________ seconds. 5. Slowly release and return to the starting position. Repeat__________ times. Complete this exercise __________ times a day. STRETCHING EXERCISES These exercises warm up your muscles and joints and improve the movement and flexibility of your injured elbow and forearm. These exercises also help to relieve pain, numbness, and tingling.These exercises are done using your healthy elbow and forearm to help stretch the muscles in your injured elbow and forearm. Exercise E: Elbow Flexion, Active-Assisted  1. Hold your left / right arm at your side, and bend your elbow as much as you can using your left / right arm muscles. 2. Use your other hand to bend your left / right elbow farther. To do this, gently push up on your forearm until you feel a gentle stretch on the back of your elbow. 3. Hold this position for __________ seconds. 4. Slowly return to the starting position. Repeat __________ times. Complete this exercise __________ times a day. Exercise F: Elbow Extension, Active-Assisted  1. Hold your left / right arm at your side, and straighten your elbow as much as you can using your left / right arm muscles. 2. Use your other hand to straighten the left / right elbow farther. To do this, gently push down on your forearm until you feel a gentle stretch on the inside of your elbow. 3. Hold this position for __________ seconds. 4. Slowly return to the starting position. Repeat __________  times. Complete this exercise __________ times a day. Exercise G: Forearm Rotation, Supination, Active-Assisted  1. Sit with your left / right elbow bent in an "L" shape (90 degrees) with your forearm resting on a table. 2. Keeping your upper body and shoulder still, rotate your forearm so your left / right palm faces upward. 3. Use your other hand to help  rotate your forearm further until you feel a gentle to moderate stretch. 4. Hold this position for __________ seconds. 5. Slowly release the stretch and return to the starting position. Repeat __________ times. Complete this exercise __________ times a day. Exercise H: Forearm Rotation, Pronation, Active-Assisted  1. Sit with your left / right elbow bent in an "L" shape (90 degrees) with your forearm resting on a table. 2. Keeping your upper body and shoulder still, rotate your forearm so your palm faces the tabletop. 3. Use your other hand to help rotate your forearm further until you feel a gentle to moderate stretch. 4. Hold this position for __________ seconds. 5. Slowly release the stretch and return to the starting position. Repeat __________ times. Complete this exercise __________ times a day. Exercise I: Elbow Flexion, Supine, Passive 1. Lie on your back. 2. Extend your left / right arm up in the air, bracing it with your other hand. 3. Let your left / right your hand slowly lower toward your shoulder, while your elbow stays pointed toward the ceiling. You should feel a gentle stretch along the back of your upper arm and elbow. 4. If instructed by your health care provider, you may increase the intensity of your stretch by adding a small wrist weight or hand weight. 5. Hold this position for __________ seconds. 6. Slowly return to the starting position. Repeat __________ times. Complete this exercise __________ times a day. Exercise J: Elbow Extension, Supine, Passive  1. Lie on your back. Make sure that you are in a comfortable position that lets you relax your arm muscles. 2. Place a folded towel under your left / right upper arm so your elbow and shoulder are at the same height. Straighten your left / right arm so your elbow does not rest on the bed or towel. 3. Let the weight of your hand stretch your elbow. Keep your arm and chest muscles relaxed. You should feel a stretch on  the inside of your elbow. 4. If told by your health care provider, you may increase the intensity of your stretch by adding a small wrist weight or hand weight. 5. Hold this position for__________ seconds. 6. Slowly release the stretch. Repeat __________ times. Complete this exercise __________ times a day. STRENGTHENING EXERCISES These exercises build strength and endurance in your elbow and forearm. Endurance is the ability to use your muscles for a long time, even after they get tired. Exercise K: Elbow Flexion, Isometric  1. Stand or sit up straight. 2. Bend your left / right elbow in an "L" shape (90 degrees) and turn your palm up so your forearm is at the height of your waist. 3. Place your other hand on top of your forearm. Gently push down as your left / right arm resists. Push as hard as you can with both arms without causing any pain or movement at your left / right elbow. 4. Hold this position for __________ seconds. 5. Slowly release the tension in both arms. Let your muscles relax completely before repeating. Repeat __________ times. Complete this exercise __________ times a day. Exercise L: Elbow Extensors, Isometric  1. Stand or sit up straight. 2. Place your left / right arm so your palm faces your abdomen and it is at the height of your waist. 3. Place your other hand on the underside of your forearm. Gently push up as your left / right arm resists. Push as hard as you can with both arms, without causing any pain or movement at your left / right elbow. 4. Hold this position for __________ seconds. 5. Slowly release the tension in both arms. Let your muscles relax completely before repeating. Repeat __________ times. Complete this exercise __________ times a day. Exercise M: Elbow Flexion With Forearm Palm Up  1. Sit upright on a firm chair without armrests, or stand. 2. Place your left / right arm at your side with your palm facing forward. 3. Holding a __________weight  or gripping a rubber exercise band or tubing, bend your elbow to bring your hand toward your shoulder. 4. Hold this position for __________ seconds. 5. Slowly return to the starting position. Repeat __________times. Complete this exercise __________times a day. Exercise N: Elbow Extension  1. Sit on a firm chair without armrests, or stand. 2. Keeping your upper arms at your sides, bring both hands up toward your left / right shoulder while you grip a rubber exercise band or tubing. Your left / right hand should be just below the other hand. 3. Straighten your left / right elbow. 4. Hold this position for __________ seconds. 5. Control the resistance of the band or tubing as your hand returns to your side. Repeat __________times. Complete this exercise __________times a day. Exercise O: Forearm Rotation, Supination  1. Sit with your left / right forearm supported on a table. Keep your elbow at waist height. 2. Rest your hand over the edge of the table with your palm facing down. 3. Gently hold a lightweight hammer. 4. Without moving your elbow, slowly rotate your forearm to turn your palm and hand upward to a "thumbs-up" position. 5. Hold this position for __________ seconds. 6. Slowly return to the starting position. Repeat __________times. Complete this exercise __________times a day. Exercise P: Forearm Rotation, Pronation  1. Sit with your left / right forearm supported on a table. Keep your elbow below shoulder height. 2. Rest your hand over the edge of the table with your palm facing up. 3. Gently hold a lightweight hammer. 4. Without moving your elbow, slowly rotate your forearm to turn your palm and hand upward to a "thumbs-up" position. 5. Hold this position for __________seconds. 6. Slowly return to the starting position. Repeat __________times. Complete this exercise __________times a day. This information is not intended to replace advice given to you by your health care  provider. Make sure you discuss any questions you have with your health care provider. Document Released: 12/03/2004 Document Revised: 05/30/2015 Document Reviewed: 10/14/2014 Elsevier Interactive Patient Education  [employer].

## 2017-09-21 ENCOUNTER — Encounter: Payer: Self-pay | Admitting: Rheumatology

## 2017-09-21 DIAGNOSIS — M810 Age-related osteoporosis without current pathological fracture: Secondary | ICD-10-CM | POA: Diagnosis not present

## 2017-09-21 DIAGNOSIS — M8588 Other specified disorders of bone density and structure, other site: Secondary | ICD-10-CM | POA: Diagnosis not present

## 2017-09-22 ENCOUNTER — Telehealth: Payer: Self-pay | Admitting: *Deleted

## 2017-09-22 NOTE — Telephone Encounter (Signed)
Bone Density Scan shows Osteopenia  T-score -1.7. Patient on Boniva. Per Dr. Estanislado Pandy patient may discontinue. Repeat Dexa in 2 years.  Patient advised of results and recommendations. Patient states she has already discontinued Boniva.

## 2017-09-28 DIAGNOSIS — E039 Hypothyroidism, unspecified: Secondary | ICD-10-CM | POA: Diagnosis not present

## 2017-09-28 DIAGNOSIS — E782 Mixed hyperlipidemia: Secondary | ICD-10-CM | POA: Diagnosis not present

## 2017-09-28 DIAGNOSIS — J45909 Unspecified asthma, uncomplicated: Secondary | ICD-10-CM | POA: Diagnosis not present

## 2017-10-13 DIAGNOSIS — S93492A Sprain of other ligament of left ankle, initial encounter: Secondary | ICD-10-CM | POA: Diagnosis not present

## 2017-10-13 DIAGNOSIS — M7711 Lateral epicondylitis, right elbow: Secondary | ICD-10-CM | POA: Diagnosis not present

## 2017-11-02 ENCOUNTER — Telehealth: Payer: Self-pay | Admitting: Rheumatology

## 2017-11-02 MED ORDER — GABAPENTIN 300 MG PO CAPS: 600.0000 mg | ORAL_CAPSULE | Freq: Every day | ORAL | 0 refills | Status: DC

## 2017-11-02 NOTE — Telephone Encounter (Signed)
Patient called requesting prescription refill of Neurontin (3 month supply) to be sent to [facility] on [address].

## 2017-11-02 NOTE — Telephone Encounter (Signed)
Last Visit: 08/26/17 Next Visit: 03/01/18  Okay to refill per Dr. Estanislado Pandy

## 2017-11-18 ENCOUNTER — Encounter: Payer: Self-pay | Admitting: Rheumatology

## 2017-11-18 DIAGNOSIS — Z853 Personal history of malignant neoplasm of breast: Secondary | ICD-10-CM | POA: Diagnosis not present

## 2017-11-18 DIAGNOSIS — Z1231 Encounter for screening mammogram for malignant neoplasm of breast: Secondary | ICD-10-CM | POA: Diagnosis not present

## 2018-02-04 ENCOUNTER — Telehealth: Payer: Self-pay | Admitting: Rheumatology

## 2018-02-04 MED ORDER — GABAPENTIN 300 MG PO CAPS: 600.0000 mg | ORAL_CAPSULE | Freq: Every day | ORAL | 0 refills | Status: DC

## 2018-02-04 MED ORDER — ZOLPIDEM TARTRATE 10 MG PO TABS: 10.0000 mg | ORAL_TABLET | Freq: Every day | ORAL | 1 refills | Status: DC

## 2018-02-04 NOTE — Telephone Encounter (Addendum)
Last Visit: 08/26/17 Next Visit: 03/23/18 Labs: 07/05/17 potassium 5.8  Patient will update labs 02/07/18.  Okay to refill Ambien and Gabapentin?   Patient would like for [place] to wait until we have her updated labs to send in Mobic prescription as it is cheaper for a 90 day supply than 30 day.

## 2018-02-04 NOTE — Telephone Encounter (Signed)
Patient requesting a refill of Mobic, Ambien, and Gabapentin sent to [facility] in [place].

## 2018-02-05 ENCOUNTER — Other Ambulatory Visit: Payer: Self-pay | Admitting: Rheumatology

## 2018-02-07 NOTE — Telephone Encounter (Signed)
Last Visit: 08/26/17 Next Visit: 03/23/18  Okay to refill per Dr. Estanislado Pandy

## 2018-02-09 ENCOUNTER — Other Ambulatory Visit: Payer: Self-pay | Admitting: Rheumatology

## 2018-02-09 DIAGNOSIS — E7800 Pure hypercholesterolemia, unspecified: Secondary | ICD-10-CM | POA: Diagnosis not present

## 2018-02-09 DIAGNOSIS — Z79899 Other long term (current) drug therapy: Secondary | ICD-10-CM | POA: Diagnosis not present

## 2018-02-09 DIAGNOSIS — Z130 Encounter for screening for diseases of the blood and blood-forming organs and certain disorders involving the immune mechanism: Secondary | ICD-10-CM | POA: Diagnosis not present

## 2018-02-09 DIAGNOSIS — M8580 Other specified disorders of bone density and structure, unspecified site: Secondary | ICD-10-CM | POA: Diagnosis not present

## 2018-02-09 DIAGNOSIS — E039 Hypothyroidism, unspecified: Secondary | ICD-10-CM | POA: Diagnosis not present

## 2018-02-09 NOTE — Telephone Encounter (Signed)
Refilled on 02/04/18

## 2018-02-11 ENCOUNTER — Telehealth: Payer: Self-pay | Admitting: Rheumatology

## 2018-02-11 MED ORDER — ZOLPIDEM TARTRATE 10 MG PO TABS: 10.0000 mg | ORAL_TABLET | Freq: Every day | ORAL | 1 refills | Status: DC

## 2018-02-11 NOTE — Telephone Encounter (Signed)
Last Visit: 08/26/17 Next Visit: 03/23/18  Can you resend the Ambien to [facility], it was sent to [facility] instead. THanks1

## 2018-02-11 NOTE — Telephone Encounter (Signed)
Patient requested a refill on Zolpadem because she did not know it was sent in. Rx was sent to incorrect pharmacy. Please resend to [facility] in [place].

## 2018-02-22 ENCOUNTER — Other Ambulatory Visit: Payer: Self-pay | Admitting: *Deleted

## 2018-02-22 MED ORDER — MELOXICAM 15 MG PO TABS: 15.0000 mg | ORAL_TABLET | Freq: Every day | ORAL | 1 refills | Status: DC

## 2018-02-22 NOTE — Telephone Encounter (Signed)
Refill for Mobic  Last Visit: 08/26/17 Next Visit: 03/23/18 Labs: 02/09/18 cbc/cmp WNL  Okay to refill per Dr. Estanislado Pandy

## 2018-03-01 ENCOUNTER — Ambulatory Visit: Payer: 59 | Admitting: Physician Assistant

## 2018-03-09 DIAGNOSIS — R350 Frequency of micturition: Secondary | ICD-10-CM | POA: Diagnosis not present

## 2018-03-09 NOTE — Progress Notes (Signed)
Office Visit Note  Patient: Michelle Gray             Date of Birth: 02-05-1955           MRN: [id number]             PCP: Cari Caraway, MD Referring: Cari Caraway, MD Visit Date: 03/23/2018 Occupation: @GUAROCC @  Subjective:  Pain in right elbow and trochanteric area.   History of Present Illness: Michelle Gray is a 63 y.o. female with history of fibromyalgia and osteoarthritis.  According to [facility] overall her symptoms are stable.  She states she had some right lateral epicondylitis which responded to diclofenac gel.  She has been having discomfort over medial epicondylitis of the right elbow now.  She also complains of some bilateral trochanteric bursitis.  Her plantar fasciitis is improved.  She continues to have some discomfort in the lower back.  Her insomnia is manageable with Ambien.  Gabapentin has been helpful with the neurologist.  She states she had repeat bone density which was in the osteopenia range.  She is currently not taking any bisphosphonates.  Activities of Daily Living:  Patient reports morning stiffness for several hours.   Patient Reports nocturnal pain.  Difficulty dressing/grooming: Denies Difficulty climbing stairs: Denies Difficulty getting out of chair: Denies Difficulty using hands for taps, buttons, cutlery, and/or writing: Reports  Review of Systems  Constitutional: Positive for fatigue. Negative for night sweats, weight gain and weight loss.  HENT: Positive for mouth dryness. Negative for mouth sores, trouble swallowing, trouble swallowing and nose dryness.   Eyes: Positive for dryness. Negative for pain, redness, itching and visual disturbance.  Respiratory: Positive for wheezing. Negative for cough, shortness of breath and difficulty breathing.        Due to asthma   Cardiovascular: Negative for chest pain, palpitations, hypertension, irregular heartbeat and swelling in legs/feet.  Gastrointestinal: Negative for abdominal pain, blood in  stool, constipation and diarrhea.  Endocrine: Negative for increased urination.  Genitourinary: Negative for painful urination, nocturia, pelvic pain and vaginal dryness.  Musculoskeletal: Positive for arthralgias, joint pain and morning stiffness. Negative for joint swelling, myalgias, muscle weakness, muscle tenderness and myalgias.  Skin: Negative for color change, rash, hair loss, redness, skin tightness, ulcers and sensitivity to sunlight.  Allergic/Immunologic: Negative for susceptible to infections.  Neurological: Negative for dizziness, light-headedness, headaches, memory loss, night sweats and weakness.  Hematological: Negative for swollen glands.  Psychiatric/Behavioral: Positive for sleep disturbance. Negative for depressed mood and confusion. The patient is not nervous/anxious.     PMFS History:  Patient Active Problem List   Diagnosis Date Noted  . Primary osteoarthritis of both hands 02/21/2016  . Fibromyalgia 02/21/2016  . History of hypothyroidism 02/21/2016  . Dyslipidemia 02/21/2016  . History of breast cancer 02/21/2016  . Age-related osteoporosis without current pathological fracture 02/21/2016  . Unspecified hypothyroidism 08/06/2013  . Other and unspecified hyperlipidemia 08/06/2013  . Insomnia 08/06/2013  . Intrinsic asthma 08/06/2013  . Hip pain, left 01/12/2011  . Plantar fasciitis 01/12/2011    Past Medical History:  Diagnosis Date  . Anemia   . Arthritis   . Asthma   . Cancer ([place])   . Hyperlipidemia   . Neuromuscular disorder ([place])   . Osteoporosis   . Thyroid disease     Family History  Problem Relation Age of Onset  . Cancer Mother   . Hyperlipidemia Mother   . Hypertension Mother   . Stroke Mother   . Dementia  Mother   . Hyperlipidemia Father   . Stroke Father   . Prostate cancer Father   . Hypertension Brother    Past Surgical History:  Procedure Laterality Date  . APPENDECTOMY    . BREAST SURGERY    . FOOT SURGERY    .  LAPROSCOPIC    . WISDOM TEETH REMOVAL     Social History   Social History Narrative  . Not on file    There is no immunization history on file for this patient.   Objective: Vital Signs: BP 121/66 (BP Location: Left Arm, Patient Position: Sitting, Cuff Size: Normal)   Pulse 68   Resp 12   Ht 5' 7.5" (1.715 m)   Wt 143 lb 12.8 oz (65.2 kg)   BMI 22.19 kg/m    Physical Exam Vitals signs and nursing note reviewed.  Constitutional:      Appearance: She is well-developed.  HENT:     Head: Normocephalic and atraumatic.  Eyes:     Conjunctiva/sclera: Conjunctivae normal.  Neck:     Musculoskeletal: Normal range of motion.  Cardiovascular:     Rate and Rhythm: Normal rate and regular rhythm.     Heart sounds: Normal heart sounds.  Pulmonary:     Effort: Pulmonary effort is normal.     Breath sounds: Normal breath sounds.  Abdominal:     General: Bowel sounds are normal.     Palpations: Abdomen is soft.  Lymphadenopathy:     Cervical: No cervical adenopathy.  Skin:    General: Skin is warm and dry.     Capillary Refill: Capillary refill takes less than 2 seconds.  Neurological:     Mental Status: She is alert and oriented to person, place, and time.  Psychiatric:        Behavior: Behavior normal.      Musculoskeletal Exam: Spine thoracic, lumbar spine good range of motion.  Shoulder joints elbow joints wrist joint MCPs.  Good range of motion.  She has mild DIP thickening in her right hand consistent with osteoarthritis.  Hip joints knee joints ankles MTPs PIPs been good range of motion.  She had tenderness over bilateral trochanteric bursa.  She also had tenderness of her medial epicondyle area.  CDAI Exam: CDAI Score: Not documented Patient Global Assessment: Not documented; Provider Global Assessment: Not documented Swollen: Not documented; Tender: Not documented Joint Exam   Not documented   There is currently no information documented on the homunculus. Go to  the Rheumatology activity and complete the homunculus joint exam.  Investigation: No additional findings.  Imaging: No results found.  Recent Labs: Lab Results  Component Value Date   WBC 16.8 (H) 08/06/2013   HGB 13.4 08/06/2013   PLT 391 08/06/2013  February 10, 2018 CBC normal, CMP normal, vitamin D 58.9, LDL 157, HDL 98, TSH normal   Speciality Comments: No specialty comments available.  Procedures:  No procedures performed Allergies: Compazine   Assessment / Plan:     Visit Diagnoses: Fibromyalgia-her symptoms are stable.  She has intermittent pain and discomfort.  Other fatigue-related to insomnia.  Primary insomnia-her sleep is manageable with Ambien.  Medial epicondylitis of right elbow-we discussed use of diclofenac gel.  Trochanteric bursitis of both hips-she has been doing stretching exercises which has been helpful.  Primary osteoarthritis of both hands-she has mild DIP thickening.  DDD (degenerative disc disease), lumbar - L5-S1 disc disease on MRI 2008, facet joint arthropathy.  She has been taking meloxicam which she  is tolerating well.  She brought some labs with her which were within normal limits.  Osteopenia of multiple sites -according to patient the most recent bone density was in the osteopenia range.  She had GI intolerance to bisphosphonates in the past.  Plantar fasciitis-she has intermittent plantar fasciitis which is not active currently.  Other medical problems are listed as follows:  History of hyperlipidemia  History of breast cancer  History of hypothyroidism   Orders: No orders of the defined types were placed in this encounter.  No orders of the defined types were placed in this encounter.     Follow-Up Instructions: Return in about 6 months (around 09/21/2018).   Bo Merino, MD  Note - This record has been created using Editor, commissioning.  Chart creation errors have been sought, but may not always  have been located.  Such creation errors do not reflect on  the standard of medical care.

## 2018-03-23 ENCOUNTER — Encounter: Payer: Self-pay | Admitting: Physician Assistant

## 2018-03-23 ENCOUNTER — Ambulatory Visit: Payer: 59 | Admitting: Rheumatology

## 2018-03-23 VITALS — BP 121/66 | HR 68 | Resp 12 | Ht 67.5 in | Wt 143.8 lb

## 2018-03-23 DIAGNOSIS — M7701 Medial epicondylitis, right elbow: Secondary | ICD-10-CM

## 2018-03-23 DIAGNOSIS — M8589 Other specified disorders of bone density and structure, multiple sites: Secondary | ICD-10-CM

## 2018-03-23 DIAGNOSIS — M797 Fibromyalgia: Secondary | ICD-10-CM | POA: Diagnosis not present

## 2018-03-23 DIAGNOSIS — F5101 Primary insomnia: Secondary | ICD-10-CM | POA: Diagnosis not present

## 2018-03-23 DIAGNOSIS — M5136 Other intervertebral disc degeneration, lumbar region: Secondary | ICD-10-CM

## 2018-03-23 DIAGNOSIS — R5383 Other fatigue: Secondary | ICD-10-CM

## 2018-03-23 DIAGNOSIS — M19042 Primary osteoarthritis, left hand: Secondary | ICD-10-CM

## 2018-03-23 DIAGNOSIS — M722 Plantar fascial fibromatosis: Secondary | ICD-10-CM

## 2018-03-23 DIAGNOSIS — M7061 Trochanteric bursitis, right hip: Secondary | ICD-10-CM

## 2018-03-23 DIAGNOSIS — M7062 Trochanteric bursitis, left hip: Secondary | ICD-10-CM

## 2018-03-23 DIAGNOSIS — M19041 Primary osteoarthritis, right hand: Secondary | ICD-10-CM

## 2018-03-23 DIAGNOSIS — Z8639 Personal history of other endocrine, nutritional and metabolic disease: Secondary | ICD-10-CM

## 2018-03-23 DIAGNOSIS — Z853 Personal history of malignant neoplasm of breast: Secondary | ICD-10-CM

## 2018-04-04 ENCOUNTER — Telehealth: Payer: Self-pay | Admitting: Rheumatology

## 2018-04-04 DIAGNOSIS — J45909 Unspecified asthma, uncomplicated: Secondary | ICD-10-CM | POA: Diagnosis not present

## 2018-04-04 DIAGNOSIS — E039 Hypothyroidism, unspecified: Secondary | ICD-10-CM | POA: Diagnosis not present

## 2018-04-04 DIAGNOSIS — E782 Mixed hyperlipidemia: Secondary | ICD-10-CM | POA: Diagnosis not present

## 2018-04-04 NOTE — Telephone Encounter (Signed)
Patient advised it is too early for refills. Patient verbalized understanding.

## 2018-04-04 NOTE — Telephone Encounter (Signed)
Patient called requesting prescription refills of Gabapentin, Mobic, and Ambien (although it might be too soon for the Ambien) to be sent to [facility] on [address].

## 2018-04-18 ENCOUNTER — Telehealth: Payer: Self-pay | Admitting: Rheumatology

## 2018-04-18 MED ORDER — MELOXICAM 15 MG PO TABS: 15.0000 mg | ORAL_TABLET | Freq: Every day | ORAL | 1 refills | Status: DC

## 2018-04-18 MED ORDER — GABAPENTIN 300 MG PO CAPS: ORAL_CAPSULE | ORAL | 0 refills | Status: DC

## 2018-04-18 NOTE — Telephone Encounter (Signed)
Patient called requesting prescription refill of Mobic and Neurontin to be sent to [place] at [facility].  Patient states she will be going out of town to help with her dad's radiation treatment.

## 2018-04-18 NOTE — Telephone Encounter (Addendum)
Last Visit: 03/23/18 Next visit: 09/21/18 Labs: 02/09/18 cbc/cmp WNL  Spoke with [facility] and they do not have refills of Mobic on file for patient.   Okay to refill per Dr. Estanislado Pandy

## 2018-05-25 DIAGNOSIS — D2262 Melanocytic nevi of left upper limb, including shoulder: Secondary | ICD-10-CM | POA: Diagnosis not present

## 2018-05-25 DIAGNOSIS — D2261 Melanocytic nevi of right upper limb, including shoulder: Secondary | ICD-10-CM | POA: Diagnosis not present

## 2018-05-25 DIAGNOSIS — L820 Inflamed seborrheic keratosis: Secondary | ICD-10-CM | POA: Diagnosis not present

## 2018-05-25 DIAGNOSIS — Z85828 Personal history of other malignant neoplasm of skin: Secondary | ICD-10-CM | POA: Diagnosis not present

## 2018-07-26 ENCOUNTER — Telehealth: Payer: Self-pay | Admitting: Rheumatology

## 2018-07-26 MED ORDER — ZOLPIDEM TARTRATE 10 MG PO TABS: 10.0000 mg | ORAL_TABLET | Freq: Every day | ORAL | 0 refills | Status: DC

## 2018-07-26 MED ORDER — GABAPENTIN 300 MG PO CAPS: ORAL_CAPSULE | ORAL | 0 refills | Status: DC

## 2018-07-26 NOTE — Telephone Encounter (Signed)
Patient requested prescription refill of Ambien and Neurontin 3 month supply to be sent to [facility].

## 2018-07-26 NOTE — Telephone Encounter (Signed)
Last Visit: 03/23/18 Next Visit: 09/21/18  Okay to refill Ambien and Gabapentin?

## 2018-09-07 NOTE — Progress Notes (Signed)
Office Visit Note  Patient: Michelle Gray             Date of Birth: May 14, 1955           MRN: [id number]             PCP: Cari Caraway, MD Referring: Cari Caraway, MD Visit Date: 09/21/2018 Occupation: @GUAROCC @  Subjective:  Fatigue and joint stiffness.   History of Present Illness: Michelle Gray is a 63 y.o. female with history of fibromyalgia, osteoarthritis and degenerative disc disease.  She states she is doing fairly well.  She has been through a lot of stress in the last few months due to her father's health.  She states she continues to have some trochanteric bursitis and piriformis syndrome.  She has not had any recent flare of plantar fasciitis.  She continues to have some lower back pain and some nocturnal pain in her lower back.  She has been exercising on regular basis.  She denies any joint swelling.  She states she is not able to sleep much despite taking gabapentin and Ambien at nighttime.  She takes Robaxin on as needed basis.  She has been taking meloxicam on a regular basis for joint pain and stiffness.  Activities of Daily Living:  Patient reports morning stiffness for 2 minutes.   Patient Reports nocturnal pain.  Difficulty dressing/grooming: Denies Difficulty climbing stairs: Denies Difficulty getting out of chair: Denies Difficulty using hands for taps, buttons, cutlery, and/or writing: Denies  Review of Systems  Constitutional: Positive for fatigue. Negative for night sweats, weight gain and weight loss.  HENT: Negative for mouth sores, trouble swallowing, trouble swallowing, mouth dryness and nose dryness.   Eyes: Negative for pain, redness, visual disturbance and dryness.  Respiratory: Negative for cough, shortness of breath and difficulty breathing.   Cardiovascular: Negative for chest pain, palpitations, hypertension, irregular heartbeat and swelling in legs/feet.  Gastrointestinal: Negative for blood in stool, constipation and diarrhea.   Endocrine: Negative for increased urination.  Genitourinary: Negative for vaginal dryness.  Musculoskeletal: Positive for myalgias, morning stiffness and myalgias. Negative for arthralgias, joint pain, joint swelling, muscle weakness and muscle tenderness.  Skin: Negative for color change, rash, hair loss, skin tightness, ulcers and sensitivity to sunlight.  Allergic/Immunologic: Negative for susceptible to infections.  Neurological: Negative for dizziness, memory loss, night sweats and weakness.  Hematological: Negative for swollen glands.  Psychiatric/Behavioral: Positive for sleep disturbance. Negative for depressed mood. The patient is nervous/anxious.     PMFS History:  Patient Active Problem List   Diagnosis Date Noted  . Primary osteoarthritis of both hands 02/21/2016  . Fibromyalgia 02/21/2016  . History of hypothyroidism 02/21/2016  . Dyslipidemia 02/21/2016  . History of breast cancer 02/21/2016  . Age-related osteoporosis without current pathological fracture 02/21/2016  . Unspecified hypothyroidism 08/06/2013  . Other and unspecified hyperlipidemia 08/06/2013  . Insomnia 08/06/2013  . Intrinsic asthma 08/06/2013  . Hip pain, left 01/12/2011  . Plantar fasciitis 01/12/2011    Past Medical History:  Diagnosis Date  . Anemia   . Arthritis   . Asthma   . Cancer ([place])   . Hyperlipidemia   . Neuromuscular disorder ([place])   . Osteoporosis   . Thyroid disease     Family History  Problem Relation Age of Onset  . Cancer Mother   . Hyperlipidemia Mother   . Hypertension Mother   . Stroke Mother   . Dementia Mother   . Hyperlipidemia Father   . Stroke  Father   . Prostate cancer Father   . Cancer Father 31       Prostate  . Hypertension Brother    Past Surgical History:  Procedure Laterality Date  . APPENDECTOMY    . BREAST SURGERY    . FOOT SURGERY    . LAPROSCOPIC    . WISDOM TEETH REMOVAL     Social History   Social History Narrative  . Not on file     There is no immunization history on file for this patient.   Objective: Vital Signs: BP 129/73 (BP Location: Left Arm, Patient Position: Sitting, Cuff Size: Small)   Pulse 70   Resp 12   Ht 5\' 7"  (1.702 m)   Wt 147 lb 12.8 oz (67 kg)   BMI 23.15 kg/m    Physical Exam Vitals signs and nursing note reviewed.  Constitutional:      Appearance: She is well-developed.  HENT:     Head: Normocephalic and atraumatic.  Eyes:     Conjunctiva/sclera: Conjunctivae normal.  Neck:     Musculoskeletal: Normal range of motion.  Cardiovascular:     Rate and Rhythm: Normal rate and regular rhythm.     Heart sounds: Normal heart sounds.  Pulmonary:     Effort: Pulmonary effort is normal.     Breath sounds: Normal breath sounds.  Abdominal:     General: Bowel sounds are normal.     Palpations: Abdomen is soft.  Lymphadenopathy:     Cervical: No cervical adenopathy.  Skin:    General: Skin is warm and dry.     Capillary Refill: Capillary refill takes less than 2 seconds.  Neurological:     Mental Status: She is alert and oriented to person, place, and time.  Psychiatric:        Behavior: Behavior normal.      Musculoskeletal Exam: She had good range of motion of cervical spine.  She has a stiffness and discomfort with lumbar spine range of motion although her range of motion was excellent.  She had no SI joint tenderness.  She had tenderness over piriformis area and bilateral trochanteric bursa.  Shoulder joints, elbow joints, wrist joints, MCPs PIPs and DIPs with good range of motion with no synovitis.  Hip joints, knee joints, ankles, MTPs and PIPs with good range of motion with no synovitis.  CDAI Exam: CDAI Score: - Patient Global: -; Provider Global: - Swollen: -; Tender: - Joint Exam   No joint exam has been documented for this visit   There is currently no information documented on the homunculus. Go to the Rheumatology activity and complete the homunculus joint exam.   Investigation: No additional findings.  Imaging: No results found.  Recent Labs: Lab Results  Component Value Date   WBC 16.8 (H) 08/06/2013   HGB 13.4 08/06/2013   PLT 391 08/06/2013  September 19, 2018 labs from [facility] CBC normal, CMP normal, TSH normal, LDL 102  Speciality Comments: No specialty comments available.  Procedures:  No procedures performed Allergies: Compazine   Assessment / Plan:     Visit Diagnoses: Fibromyalgia -she continues to have some generalized pain and discomfort.  She has positive tender points.    Primary insomnia -she has been experiencing some insomnia despite being on gabapentin and Ambien.  She needs refills which will be sent today.  Other fatigue -due to increased workload, insomnia and fibromyalgia.  Medial epicondylitis of right elbow -improved.  Trochanteric bursitis of both hips -she  had bilateral trochanteric bursitis.  Stretching exercises were discussed.  Primary osteoarthritis of both hands -not having much discomfort.  DDD (degenerative disc disease), lumbar - L5-S1 disc disease on MRI 2008, facet joint arthropathy. meloxicam 50 mg p.o. daily has been helpful.  Her most recent labs done at her office were within normal limits.  Osteopenia of multiple sites - according to patient the most recent bone density was in the osteopenia range.  She had GI intolerance to bisphosphonates in the past.  Plantar fasciitis -she has intermittent flares only.  History of hypothyroidism   History of hyperlipidemia   History of breast cancer   Orders: No orders of the defined types were placed in this encounter.  Meds ordered this encounter  Medications  . meloxicam (MOBIC) 15 MG tablet    Sig: Take 1 tablet (15 mg total) by mouth daily.    Dispense:  90 tablet    Refill:  0     Follow-Up Instructions: Return in about 6 months (around 03/24/2019) for Osteoarthritis.   Bo Merino, MD  Note - This record has been created  using Editor, commissioning.  Chart creation errors have been sought, but may not always  have been located. Such creation errors do not reflect on  the standard of medical care.

## 2018-09-21 ENCOUNTER — Encounter: Payer: Self-pay | Admitting: Rheumatology

## 2018-09-21 ENCOUNTER — Encounter: Payer: Self-pay | Admitting: *Deleted

## 2018-09-21 ENCOUNTER — Other Ambulatory Visit: Payer: Self-pay

## 2018-09-21 ENCOUNTER — Ambulatory Visit: Payer: BC Managed Care – PPO | Admitting: Rheumatology

## 2018-09-21 VITALS — BP 129/73 | HR 70 | Resp 12 | Ht 67.0 in | Wt 147.8 lb

## 2018-09-21 DIAGNOSIS — M7701 Medial epicondylitis, right elbow: Secondary | ICD-10-CM | POA: Diagnosis not present

## 2018-09-21 DIAGNOSIS — Z8639 Personal history of other endocrine, nutritional and metabolic disease: Secondary | ICD-10-CM

## 2018-09-21 DIAGNOSIS — R5383 Other fatigue: Secondary | ICD-10-CM

## 2018-09-21 DIAGNOSIS — M8589 Other specified disorders of bone density and structure, multiple sites: Secondary | ICD-10-CM

## 2018-09-21 DIAGNOSIS — M5136 Other intervertebral disc degeneration, lumbar region: Secondary | ICD-10-CM

## 2018-09-21 DIAGNOSIS — F5101 Primary insomnia: Secondary | ICD-10-CM

## 2018-09-21 DIAGNOSIS — Z853 Personal history of malignant neoplasm of breast: Secondary | ICD-10-CM

## 2018-09-21 DIAGNOSIS — M7061 Trochanteric bursitis, right hip: Secondary | ICD-10-CM

## 2018-09-21 DIAGNOSIS — M19041 Primary osteoarthritis, right hand: Secondary | ICD-10-CM

## 2018-09-21 DIAGNOSIS — M722 Plantar fascial fibromatosis: Secondary | ICD-10-CM

## 2018-09-21 DIAGNOSIS — M7062 Trochanteric bursitis, left hip: Secondary | ICD-10-CM

## 2018-09-21 DIAGNOSIS — M797 Fibromyalgia: Secondary | ICD-10-CM

## 2018-09-21 DIAGNOSIS — M19042 Primary osteoarthritis, left hand: Secondary | ICD-10-CM

## 2018-09-21 MED ORDER — MELOXICAM 15 MG PO TABS: 15.0000 mg | ORAL_TABLET | Freq: Every day | ORAL | 0 refills | Status: DC

## 2018-10-26 ENCOUNTER — Other Ambulatory Visit: Payer: Self-pay | Admitting: Rheumatology

## 2018-10-26 NOTE — Telephone Encounter (Signed)
Patient request refill on Ambien 10mg  90 day supply, Gabapentin 90 day supply, Robaxin #90 to [facility] in [place].

## 2018-10-26 NOTE — Telephone Encounter (Signed)
Ok to refill Gabapentin and Ambien.    Robaxin has not been refilled in 2 years.  Is she still taking Robaxin? We do not recommend long-term use of robaxin

## 2018-10-26 NOTE — Telephone Encounter (Signed)
Last Visit: 09/21/18 Next Visit: 03/22/19  Okay to refill Ambien, gabapentin, Robaxin?

## 2018-10-27 MED ORDER — METHOCARBAMOL 500 MG PO TABS: 500.0000 mg | ORAL_TABLET | Freq: Two times a day (BID) | ORAL | 0 refills | Status: DC | PRN

## 2018-10-27 MED ORDER — ZOLPIDEM TARTRATE 10 MG PO TABS: 10.0000 mg | ORAL_TABLET | Freq: Every day | ORAL | 0 refills | Status: DC

## 2018-10-27 MED ORDER — GABAPENTIN 300 MG PO CAPS: ORAL_CAPSULE | ORAL | 0 refills | Status: DC

## 2018-10-27 NOTE — Telephone Encounter (Signed)
Patient states she does still use the Robaxin but only on a PRN basis. Patient states she hurt her back last week which has caused her to need it. Patient has requested the 90 days supply of it because it cheaper than a 30 day supply.

## 2018-10-27 NOTE — Telephone Encounter (Signed)
Ok to send in 33 supply with no refills.

## 2018-11-15 ENCOUNTER — Encounter: Payer: Self-pay | Admitting: Rheumatology

## 2018-11-29 ENCOUNTER — Other Ambulatory Visit: Payer: Self-pay

## 2018-11-29 DIAGNOSIS — Z20822 Contact with and (suspected) exposure to covid-19: Secondary | ICD-10-CM

## 2018-12-02 ENCOUNTER — Other Ambulatory Visit: Payer: Self-pay

## 2018-12-02 DIAGNOSIS — Z20822 Contact with and (suspected) exposure to covid-19: Secondary | ICD-10-CM

## 2018-12-02 LAB — NOVEL CORONAVIRUS, NAA

## 2018-12-05 LAB — NOVEL CORONAVIRUS, NAA: SARS-CoV-2, NAA: NOT DETECTED

## 2018-12-08 ENCOUNTER — Other Ambulatory Visit: Payer: Self-pay

## 2018-12-08 ENCOUNTER — Encounter: Payer: Self-pay | Admitting: Cardiology

## 2018-12-08 ENCOUNTER — Ambulatory Visit: Payer: BC Managed Care – PPO | Admitting: Cardiology

## 2018-12-08 VITALS — BP 122/77 | HR 68 | Ht 67.0 in | Wt 145.0 lb

## 2018-12-08 DIAGNOSIS — R072 Precordial pain: Secondary | ICD-10-CM

## 2018-12-08 DIAGNOSIS — E785 Hyperlipidemia, unspecified: Secondary | ICD-10-CM | POA: Diagnosis not present

## 2018-12-08 DIAGNOSIS — R002 Palpitations: Secondary | ICD-10-CM

## 2018-12-08 MED ORDER — DILTIAZEM HCL 60 MG PO TABS: ORAL_TABLET | ORAL | 0 refills | Status: DC

## 2018-12-08 NOTE — Patient Instructions (Addendum)
Medication Instructions:  Continue same medications   Lab Work: Bmet to be done 1 week before Coronary CT   Lab order enclosed   Testing/Procedures:  Schedule Echo  Schedule 30 day Event Monitor  Schedule Coronary CT   Scheduler will call back with appointment    Follow instructions below  Follow-Up: At [facility], you and your health needs are our priority.  As part of our continuing mission to provide you with exceptional heart care, we have created designated Provider Care Teams.  These Care Teams include your primary Cardiologist (physician) and Advanced Practice Providers (APPs -  Physician Assistants and Nurse Practitioners) who all work together to provide you with the care you need, when you need it.  Your next appointment:  3 months   The format for your next appointment:  Office   Provider:  Dr.Schumann   Your cardiac CT will be scheduled at one of the below locations:   [facility] [address] [place], [place] [address] [phone]  [place] [address] [place], [place] [address] [phone]  If scheduled at [facility], please arrive at the [facility] main entrance of [facility] 30-45 minutes prior to test start time. Proceed to the [facility] Radiology Department (first floor) to check-in and test prep.  If scheduled at [facility], please arrive 15 mins early for check-in and test prep.  Please follow these instructions carefully (unless otherwise directed):   On the Night Before the Test: . Be sure to Drink plenty of water. . Do not consume any caffeinated/decaffeinated beverages or chocolate 12 hours prior to your test. . Do not take any antihistamines 12 hours prior to your test.   On the Day of the Test: . Drink plenty of water. Do not drink any water within one hour of the test. . Do not eat any food 4 hours prior to the  test. . You may take your regular medications prior to the test.  . Take Diltiazem 60 mg. two hours prior to test. . FEMALES- please wear underwire-free bra if available          After the Test: . Drink plenty of water. . After receiving IV contrast, you may experience a mild flushed feeling. This is normal. . On occasion, you may experience a mild rash up to 24 hours after the test. This is not dangerous. If this occurs, you can take Benadryl 25 mg and increase your fluid intake. . If you experience trouble breathing, this can be serious. If it is severe call [phone] IMMEDIATELY. If it is mild, please call our office.   Once we have confirmed authorization from your insurance company, we will call you to set up a date and time for your test.   For non-scheduling related questions, please contact the cardiac imaging nurse navigator should you have any questions/concerns: Marchia Bond, RN Navigator Cardiac Imaging [facility] [phone] Office

## 2018-12-08 NOTE — Progress Notes (Signed)
Cardiology Office Note:    Date:  12/08/2018   ID:  Michelle Gray, DOB 1955/05/13, MRN [id number]  PCP:  Cari Caraway, MD  Cardiologist:  No primary care provider on file.  Electrophysiologist:  None   Referring MD: Cari Caraway, MD   Chief Complaint  Patient presents with  . Chest Pain  . Palpitations    History of Present Illness:    Michelle Gray is a 63 y.o. female with a hx of fibromyalgia, asthma, hyperlipidemia, breast cancer, hypothyroidism who is referred by Dr. Addison Lank for initial evaluation of palpitations and chest pain.  She reports that she has had intermittent palpitations over the last 2 weeks.  States that during the episode she does not feel that her heart is racing but feels that her heart beat is irregular.  Has had 5 episodes, the longest lasted about an hour and a half.  No lightheadedness or syncope during episodes.  She also reports that she has been having chest pressure over the last 2 weeks.  Describes as pressure in center of her chest.  Has not noted relationship with exertion.  She has no smoking history.  Mother had atrial fibrillation.  No other family history of heart disease.  TSH was 0.93 on 10/23.   Past Medical History:  Diagnosis Date  . Anemia   . Arthritis   . Asthma   . Cancer ([place])   . Hyperlipidemia   . Neuromuscular disorder ([place])   . Osteoporosis   . Thyroid disease     Past Surgical History:  Procedure Laterality Date  . APPENDECTOMY    . BREAST SURGERY    . FOOT SURGERY    . LAPROSCOPIC    . WISDOM TEETH REMOVAL      Current Medications: Current Meds  Medication Sig  . 5-HTP CAPS Take 2 tablets by mouth at bedtime.  Marland Kitchen albuterol (PROVENTIL HFA;VENTOLIN HFA) 108 (90 BASE) MCG/ACT inhaler Inhale 2 puffs into the lungs every 6 (six) hours as needed for wheezing or shortness of breath.  . Biotin 5000 MCG CAPS Take 5,000 mcg by mouth daily.  . Cholecalciferol (VITAMIN D3) 5000 units CAPS Take 10,000 Units by mouth  once a week.  . folic acid (FOLVITE) Q000111Q MCG tablet Take 400 mcg by mouth daily.  Marland Kitchen gabapentin (NEURONTIN) 300 MG capsule TAKE TWO CAPSULES BY MOUTH DAILY AT BEDTIME  . Glucosamine 500 MG TABS Take 1,500 mg by mouth daily.    Marland Kitchen levothyroxine (SYNTHROID, LEVOTHROID) 50 MCG tablet Take 50 mcg by mouth every other day.    . levothyroxine (SYNTHROID, LEVOTHROID) 75 MCG tablet Take 75 mcg by mouth every other day.    . Magnesium 500 MG CAPS Take 500 mg by mouth daily.    . meloxicam (MOBIC) 15 MG tablet Take 1 tablet (15 mg total) by mouth daily.  . methocarbamol (ROBAXIN) 500 MG tablet Take 1 tablet (500 mg total) by mouth 2 (two) times daily as needed.  . Misc Natural Products (TART CHERRY ADVANCED PO) Take by mouth.  . rosuvastatin (CRESTOR) 10 MG tablet Take 20 mg by mouth daily.   . TURMERIC PO Take 1 tablet by mouth daily.  Merril Abbe 10 MCG TABS vaginal tablet INSERT 1 TABLET VAGINALLY TWO TIMES A WEEK  . zolpidem (AMBIEN) 10 MG tablet Take 1 tablet (10 mg total) by mouth at bedtime.  . [DISCONTINUED] SYMBICORT 80-4.5 MCG/ACT inhaler Inhale 2 puffs into the lungs 2 (two) times daily.  Allergies:   Compazine   Social History   Socioeconomic History  . Marital status: Married    Spouse name: Not on file  . Number of children: Not on file  . Years of education: Not on file  . Highest education level: Not on file  Occupational History  . Not on file  Social Needs  . Financial resource strain: Not on file  . Food insecurity    Worry: Not on file    Inability: Not on file  . Transportation needs    Medical: Not on file    Non-medical: Not on file  Tobacco Use  . Smoking status: Never Smoker  . Smokeless tobacco: Never Used  Substance and Sexual Activity  . Alcohol use: Yes    Comment: occ  . Drug use: Never  . Sexual activity: Not on file  Lifestyle  . Physical activity    Days per week: Not on file    Minutes per session: Not on file  . Stress: Not on file   Relationships  . Social Herbalist on phone: Not on file    Gets together: Not on file    Attends religious service: Not on file    Active member of club or organization: Not on file    Attends meetings of clubs or organizations: Not on file    Relationship status: Not on file  Other Topics Concern  . Not on file  Social History Narrative  . Not on file     Family History: The patient's family history includes Cancer in her mother; Cancer (age of onset: 84) in her father; Dementia in her mother; Hyperlipidemia in her father and mother; Hypertension in her brother and mother; Prostate cancer in her father; Stroke in her father and mother.  ROS:   Please see the history of present illness.    All other systems reviewed and are negative.  EKGs/Labs/Other Studies Reviewed:    The following studies were reviewed today:   EKG:  EKG is ordered today.  The ekg ordered today demonstrates normal sinus rhythm, first-degree AV block, left axis deviation, Q waves in V1-3  Recent Labs: No results found for requested labs within last 8760 hours.  Recent Lipid Panel No results found for: CHOL, TRIG, HDL, CHOLHDL, VLDL, LDLCALC, LDLDIRECT  Physical Exam:    VS:  BP 122/77   Pulse 68   Ht 5\' 7"  (1.702 m)   Wt 145 lb (65.8 kg)   SpO2 98%   BMI 22.71 kg/m     Wt Readings from Last 3 Encounters:  12/08/18 145 lb (65.8 kg)  09/21/18 147 lb 12.8 oz (67 kg)  03/23/18 143 lb 12.8 oz (65.2 kg)     GEN:  Well nourished, well developed in no acute distress HEENT: Normal NECK: No JVD; No carotid bruits LYMPHATICS: No lymphadenopathy CARDIAC: RRR, no murmurs, rubs, gallops RESPIRATORY:  Expiratory wheezing ABDOMEN: Soft, non-tender, non-distended MUSCULOSKELETAL:  No edema; No deformity  SKIN: Warm and dry NEUROLOGIC:  Alert and oriented x 3 PSYCHIATRIC:  Normal affect   ASSESSMENT:    1. Palpitations   2. Precordial pain   3. Hyperlipidemia, unspecified hyperlipidemia  type    PLAN:     Chest pain: Atypical in description, as describes substernal chest pressure but not related to exertion.  Given age and risk factors (hyperlipidemia), would classify as intermediate risk for obstructive coronary disease and warrants further evaluation -Coronary CTA.. Given asthma history with wheezing on  exam today, will avoid metoprolol and give diltiazem 60 mg prior to scan -TTE  Palpitations: Description concerning for arrhythmia.  Will check 30-day event monitor  Hyperlipidemia: On Crestor 20 mg daily.  LDL 102 on 09/19/18  RTC in 3 months   Medication Adjustments/Labs and Tests Ordered: Current medicines are reviewed at length with the patient today.  Concerns regarding medicines are outlined above.  Orders Placed This Encounter  Procedures  . CT CORONARY MORPH W/CTA COR W/SCORE W/CA W/CM &/OR WO/CM  . CT CORONARY FRACTIONAL FLOW RESERVE DATA PREP  . CT CORONARY FRACTIONAL FLOW RESERVE FLUID ANALYSIS  . Basic metabolic panel  . Cardiac event monitor  . EKG 12-Lead  . ECHOCARDIOGRAM COMPLETE   Meds ordered this encounter  Medications  . diltiazem (CARDIZEM) 60 MG tablet    Sig: Take 60 mg 2 hours before Coronary CT    Dispense:  1 tablet    Refill:  0    There are no Patient Instructions on file for this visit.   Signed, Donato Heinz, MD  12/08/2018 10:53 AM    [place]

## 2018-12-15 ENCOUNTER — Telehealth: Payer: Self-pay | Admitting: Cardiology

## 2018-12-15 NOTE — Telephone Encounter (Signed)
Patient calling to check up on the status of her heart monitor. Please Advise.

## 2018-12-15 NOTE — Telephone Encounter (Signed)
[employer] to ship a 30 day cardiac event monitor to her home.  Instructions reviewed briefly as they are included in the monitor kit.

## 2018-12-20 ENCOUNTER — Other Ambulatory Visit: Payer: Self-pay

## 2018-12-20 ENCOUNTER — Ambulatory Visit (HOSPITAL_COMMUNITY): Payer: BC Managed Care – PPO | Attending: Cardiology

## 2018-12-20 DIAGNOSIS — R072 Precordial pain: Secondary | ICD-10-CM

## 2018-12-22 ENCOUNTER — Other Ambulatory Visit: Payer: Self-pay

## 2018-12-22 ENCOUNTER — Ambulatory Visit (INDEPENDENT_AMBULATORY_CARE_PROVIDER_SITE_OTHER): Payer: BC Managed Care – PPO | Admitting: Internal Medicine

## 2018-12-22 ENCOUNTER — Encounter: Payer: Self-pay | Admitting: Internal Medicine

## 2018-12-22 DIAGNOSIS — J45991 Cough variant asthma: Secondary | ICD-10-CM | POA: Diagnosis not present

## 2018-12-22 MED ORDER — PANTOPRAZOLE SODIUM 40 MG PO TBEC: 40.0000 mg | DELAYED_RELEASE_TABLET | Freq: Every day | ORAL | 2 refills | Status: DC

## 2018-12-22 MED ORDER — FAMOTIDINE 20 MG PO TABS: ORAL_TABLET | ORAL | 11 refills | Status: DC

## 2018-12-22 NOTE — Progress Notes (Signed)
Michelle Gray, female    DOB: Jul 06, 1955,     MRN: [id number]   Brief patient profile:  72 yowf never smoker/NP for [facility]  with onset asthma around 1990 eval here around 2005 on prn saba rarely needed but esp in extremes of heat /cold but  not spring / fall and no assoc cough or need for maint  then around 2016 developed chest tightness and wheezing more of a chronic pattern improved p rx symb 80 2bid improved and rare saba then early 2020 noted more doe so around late Oct 2020 changed symb 160 and some better but still uncomfortable with deep breath so self referred back to pulmonary 12/22/2018 .     History of Present Illness  12/22/2018  Pulmonary/ 1st office eval/[facility]  Chief Complaint  Patient presents with  . Pulmonary Consult    Self referral- asthma- trouble taking a deep breath and wheezing. She is using her albuterol inhaler daily.   Dyspnea:  Ex bike not using and limited by fibromyalgia/ plantar fasciitis/ ok with yard work and steps / some chest discomfort midline with deep breath but not with ex Cough: not a lot but finds herself throat clearing x 30 years  " just her like her dad" no mucus production and only happens while awake  Sleep: no resp problem SABA use: p symb 160 x 2 6 am  Typically feels needs saba w/in a few hours  No nasal symptoms   No obvious day to day or daytime variability or assoc excess/ purulent sputum or mucus plugs or hemoptysis   or overt sinus or hb symptoms.   Sleeping  without nocturnal  or early am exacerbation  of respiratory  c/o's or need for noct saba. Also denies any obvious fluctuation of symptoms with weather or environmental changes or other aggravating or alleviating factors except as outlined above   No unusual exposure hx or h/o childhood pna/ asthma or knowledge of premature birth.  Current Allergies, Complete Past Medical History, Past Surgical History, Family History, and Social History were reviewed in [facility] record.  ROS  The following are not active complaints unless bolded Hoarseness, sore throat, dysphagia, dental problems, itching, sneezing,  nasal congestion or discharge of excess mucus or purulent secretions, ear ache,   fever, chills, sweats, unintended wt loss or wt gain, classically pleuritic or exertional cp,  orthopnea pnd or arm/hand swelling  or leg swelling, presyncope, palpitations, abdominal pain, anorexia, nausea, vomiting, diarrhea  or change in bowel habits or change in bladder habits, change in stools or change in urine, dysuria, hematuria,  rash, arthralgias/plantar fasciitis/ ? Fibromyalgia per rheum visual complaints, headache, numbness, weakness or ataxia or problems with walking or coordination,  change in mood or  memory.           Past Medical History:  Diagnosis Date  . Anemia   . Arthritis   . Asthma   . Cancer ([place])   . Hyperlipidemia   . Neuromuscular disorder ([place])   . Osteoporosis   . Thyroid disease     Outpatient Medications Prior to Visit  Medication Sig Dispense Refill  . 5-HTP CAPS Take 2 tablets by mouth at bedtime.    Marland Kitchen albuterol (PROVENTIL HFA;VENTOLIN HFA) 108 (90 BASE) MCG/ACT inhaler Inhale 2 puffs into the lungs every 6 (six) hours as needed for wheezing or shortness of breath.    . Biotin 5000 MCG CAPS Take 5,000 mcg by mouth daily.    Marland Kitchen  Cholecalciferol (VITAMIN D3) 5000 units CAPS Take 10,000 Units by mouth once a week.    . folic acid (FOLVITE) [id number] MCG tablet Take 400 mcg by mouth daily.    Marland Kitchen gabapentin (NEURONTIN) 300 MG capsule TAKE TWO CAPSULES BY MOUTH DAILY AT BEDTIME 180 capsule 0  . Glucosamine 500 MG TABS Take 1,500 mg by mouth daily.      Marland Kitchen levothyroxine (SYNTHROID, LEVOTHROID) 50 MCG tablet Take 50 mcg by mouth every other day.      . levothyroxine (SYNTHROID, LEVOTHROID) 75 MCG tablet Take 75 mcg by mouth every other day.      . Magnesium 500 MG CAPS Take 500 mg by mouth daily.      . meloxicam (MOBIC) 15 MG  tablet Take 1 tablet (15 mg total) by mouth daily. 90 tablet 0  . methocarbamol (ROBAXIN) 500 MG tablet Take 1 tablet (500 mg total) by mouth 2 (two) times daily as needed. 180 tablet 0  . Misc Natural Products (TART CHERRY ADVANCED PO) Take by mouth.    . rosuvastatin (CRESTOR) 10 MG tablet Take 20 mg by mouth daily.     . SYMBICORT 160-4.5 MCG/ACT inhaler SMARTSIG:2 Puff(s) By Mouth Twice Daily    . TURMERIC PO Take 1 tablet by mouth daily.    Merril Abbe 10 MCG TABS vaginal tablet INSERT 1 TABLET VAGINALLY TWO TIMES A WEEK  4  . zolpidem (AMBIEN) 10 MG tablet Take 1 tablet (10 mg total) by mouth at bedtime. 90 tablet 0  . diltiazem (CARDIZEM) 60 MG tablet Take 60 mg 2 hours before Coronary CT (Patient not taking: Reported on 12/22/2018) 1 tablet 0  . folic acid (FOLVITE) [id number] MCG tablet Take 400 mcg by mouth daily.     No facility-administered medications prior to visit.      Objective:     BP 122/74 (BP Location: Left Arm, Cuff Size: Normal)   Pulse 68   Temp 97.7 F (36.5 C) (Temporal)   Ht 5\' 7"  (1.702 m)   Wt 145 lb 12.8 oz (66.1 kg)   SpO2 99% Comment: on RA  BMI 22.84 kg/m   SpO2: 99 %(on RA)   Pleasant amb wf nad     HEENT : pt wearing mask not removed for exam due to covid -19 concerns.    NECK :  without JVD/Nodes/TM/ nl carotid upstrokes bilaterally   LUNGS: no acc muscle use,  Nl contour chest with a few insp squeaks bilaterally without cough on insp or exp maneuvers   CV:  RRR  no s3 or murmur or increase in P2, and no edema   ABD:  soft and nontender with nl inspiratory excursion in the supine position. No bruits or organomegaly appreciated, bowel sounds nl  MS:  Nl gait/ ext warm without deformities, calf tenderness, cyanosis or clubbing No obvious joint restrictions   SKIN: warm and dry without lesions    NEURO:  alert, approp, nl sensorium with  no motor or cerebellar deficits apparent.     cxr 2 weeks prior to OV  wnl per [facility] not available at  ov      Assessment   Cough variant asthma with ? component upper airway cough syndrome ? Onset 1990s - initial pulmonary eval 2005 [facility] (paper chart requested) - worse since 2018 rx symb 80 2bid and increased to 160 2bid  Oct 2020 but still over using saba  - spacer added 12/22/2018 as has component of uacs   DDX of  difficult airways management almost all start with A and  include Adherence, Ace Inhibitors, Acid Reflux, Active Sinus Disease, Alpha 1 Antitripsin deficiency, Anxiety masquerading as Airways dz,  ABPA,  Allergy(esp in young), Aspiration (esp in elderly), Adverse effects of meds,  Active smoking or vaping, A bunch of PE's (a small clot burden can't cause this syndrome unless there is already severe underlying pulm or vascular dz with poor reserve) plus two Bs  = Bronchiectasis and Beta blocker use..and one C= CHF   Adherence is always the initial "prime suspect" and is a multilayered concern that requires a "trust but verify" approach in every patient - starting with knowing how to use medications, especially inhalers, correctly, keeping up with refills and understanding the fundamental difference between maintenance and prns vs those medications only taken for a very short course and then stopped and not refilled.  - - The proper method of use, as well as anticipated side effects, of a metered-dose inhaler are discussed and demonstrated to the patient. Improved effectiveness after extensive coaching during this visit to a level of approximately 100 % via spacer   ? Adverse drug effects > the high dose ICS may be now aggravating longstanding UACS - Upper airway cough syndrome (previously labeled PNDS),  is so named because it's frequently impossible to sort out how much is  CR/sinusitis with freq throat clearing (which can be related to primary GERD)   vs  causing  secondary (" extra esophageal")  GERD from wide swings in gastric pressure that occur with throat clearing, often  promoting  self use of mint and menthol lozenges that reduce the lower esophageal sphincter tone and exacerbate the problem further in a cyclical fashion.   These are the same pts (now being labeled as having "irritable larynx syndrome"  suggested by throat clearing daytime x 30 y just like her dad) who not infrequently have a history of having failed to tolerate ace inhibitors,  dry powder inhalers (or any high dose ics)  or biphosphonates or report having atypical/extraesophageal reflux symptoms that don't respond to standard doses of PPI  and are easily confused as having aecopd or asthma flares by even experienced allergists/ pulmonologists (myself included).    ? Acid (or non-acid) GERD > always difficult to exclude as up to 75% of pts in some series report no assoc GI/ Heartburn symptoms> rec max (24h)  acid suppression and diet restrictions/ reviewed and instructions given in writing.   ? Allergy/ABPA  > consider further w/u on return if not improved   ? Anxiety > usually at the bottom of this list of usual suspects but may interfere with adherence and also interpretation of response or lack thereof to symptom management which can be quite subjective.   Beta blockers> not in use    >>> regroup in 4 weeks - great candidate for spirometry before and after plus FENO if can work it out    Total time devoted to counseling  > 50 % of initial 45 min office visit:  reviewed case with pt/I performed device teaching  using a teach back technique which also  extended face to face time for this visit (see above)  discussion of options/alternatives/ personally creating written customized instructions  in presence of pt  then going over those specific  Instructions directly with the pt including how to use all of the meds but in particular covering each new medication in detail and the difference between the maintenance= "automatic" meds and the prns  using an action plan format for the latter (If this problem/symptom  => do that organization reading Left to right).  Please see AVS from this visit for a full list of these instructions which I personally wrote for this pt and  are unique to this visit.      Christinia Gully, MD 12/22/2018

## 2018-12-22 NOTE — Patient Instructions (Addendum)
Plan A = Automatic = Always=    symbicort 160 Take 2 puffs first thing in am and then another 2 puffs about 12 hours later.   Work on inhaler technique:  relax and gently blow all the way out then take a nice smooth deep breath back in, triggering the inhaler a second before you start breathing in thu the spacer.  Hold for up to 5 seconds if you can. Blow out thru nose. Rinse and gargle with water when done     Plan B = Backup (to supplement plan A, not to replace it) Only use your albuterol inhaler as a rescue medication to be used if you can't catch your breath by resting or doing a relaxed purse lip breathing pattern.  - The less you use it, the better it will work when you need it. - Ok to use the inhaler up to 2 puffs  every 4 hours if you must but call for appointment if use goes up over your usual need - Don't leave home without it !!  (think of it like the spare tire for your car)     Pantoprazole (protonix) 40 mg   Take  30-60 min before first meal of the day and Pepcid (famotidine)  20 mg one @  After supper  until return to office - this is the best way to tell whether stomach acid is contributing to your problem.    GERD (REFLUX)  is an extremely common cause of respiratory symptoms just like yours , many times with no obvious heartburn at all.    It can be treated with medication, but also with lifestyle changes including elevation of the head of your bed (ideally with 6 -8inch blocks under the headboard of your bed),  Smoking cessation, avoidance of late meals, excessive alcohol, and avoid fatty foods, chocolate, peppermint, colas, red wine, and acidic juices such as orange juice.  NO MINT OR MENTHOL PRODUCTS SO NO COUGH DROPS  USE SUGARLESS CANDY INSTEAD (Jolley ranchers or Stover's or Life Savers) or even ice chips will also do - the key is to swallow to prevent all throat clearing. NO OIL BASED VITAMINS - use powdered substitutes.  Avoid fish oil when coughing.   Please  schedule a follow up office visit in 4 weeks, sooner if needed

## 2018-12-23 ENCOUNTER — Encounter: Payer: Self-pay | Admitting: Internal Medicine

## 2018-12-23 DIAGNOSIS — J45991 Cough variant asthma: Secondary | ICD-10-CM | POA: Insufficient documentation

## 2018-12-23 NOTE — Assessment & Plan Note (Addendum)
? Onset 1990s - initial pulmonary eval 2005 [facility] (paper chart requested) - worse since 2018 rx symb 80 2bid and increased to 160 2bid  Oct 2020 but still over using saba  - spacer added 12/22/2018 as has component of uacs   DDX of  difficult airways management almost all start with A and  include Adherence, Ace Inhibitors, Acid Reflux, Active Sinus Disease, Alpha 1 Antitripsin deficiency, Anxiety masquerading as Airways dz,  ABPA,  Allergy(esp in young), Aspiration (esp in elderly), Adverse effects of meds,  Active smoking or vaping, A bunch of PE's (a small clot burden can't cause this syndrome unless there is already severe underlying pulm or vascular dz with poor reserve) plus two Bs  = Bronchiectasis and Beta blocker use..and one C= CHF   Adherence is always the initial "prime suspect" and is a multilayered concern that requires a "trust but verify" approach in every patient - starting with knowing how to use medications, especially inhalers, correctly, keeping up with refills and understanding the fundamental difference between maintenance and prns vs those medications only taken for a very short course and then stopped and not refilled.  - - The proper method of use, as well as anticipated side effects, of a metered-dose inhaler are discussed and demonstrated to the patient. Improved effectiveness after extensive coaching during this visit to a level of approximately 100 % via spacer   ? Adverse drug effects > the high dose ICS may be now aggravating longstanding UACS - Upper airway cough syndrome (previously labeled PNDS),  is so named because it's frequently impossible to sort out how much is  CR/sinusitis with freq throat clearing (which can be related to primary GERD)   vs  causing  secondary (" extra esophageal")  GERD from wide swings in gastric pressure that occur with throat clearing, often  promoting self use of mint and menthol lozenges that reduce the lower esophageal sphincter tone and  exacerbate the problem further in a cyclical fashion.   These are the same pts (now being labeled as having "irritable larynx syndrome"  suggested by throat clearing daytime x 30 y just like her dad) who not infrequently have a history of having failed to tolerate ace inhibitors,  dry powder inhalers (or any high dose ics)  or biphosphonates or report having atypical/extraesophageal reflux symptoms that don't respond to standard doses of PPI  and are easily confused as having aecopd or asthma flares by even experienced allergists/ pulmonologists (myself included).    ? Acid (or non-acid) GERD > always difficult to exclude as up to 75% of pts in some series report no assoc GI/ Heartburn symptoms> rec max (24h)  acid suppression and diet restrictions/ reviewed and instructions given in writing.   ? Allergy/ABPA  > consider further w/u on return if not improved   ? Anxiety > usually at the bottom of this list of usual suspects but may interfere with adherence and also interpretation of response or lack thereof to symptom management which can be quite subjective.   Beta blockers> not in use    >>> regroup in 4 weeks - great candidate for spirometry before and after plus FENO if can work it out    Total time devoted to counseling  > 50 % of initial 60 min office visit:  reviewed case with pt/I performed device teaching  using a teach back technique which also  extended face to face time for this visit (see above)  discussion of options/alternatives/ personally creating  written customized instructions  in presence of pt  then going over those specific  Instructions directly with the pt including how to use all of the meds but in particular covering each new medication in detail and the difference between the maintenance= "automatic" meds and the prns using an action plan format for the latter (If this problem/symptom => do that organization reading Left to right).  Please see AVS from this visit for a full  list of these instructions which I personally wrote for this pt and  are unique to this visit.

## 2018-12-24 ENCOUNTER — Ambulatory Visit (INDEPENDENT_AMBULATORY_CARE_PROVIDER_SITE_OTHER): Payer: BC Managed Care – PPO

## 2018-12-24 DIAGNOSIS — R072 Precordial pain: Secondary | ICD-10-CM

## 2018-12-24 DIAGNOSIS — R002 Palpitations: Secondary | ICD-10-CM

## 2019-01-10 ENCOUNTER — Telehealth: Payer: Self-pay | Admitting: Cardiology

## 2019-01-10 NOTE — Telephone Encounter (Signed)
Spoke with pt who states she has had intermittent palpitations for the past 12 hours. Denies any additional symptoms; states HR irregular. Confirmed last BP 127/77 and HR was 81. Pt last seen by Dr. Gardiner Rhyme 11/5. Coronary CTA and 30-day event monitor ordered ([employer]). Pt states she contacted heart monitor company earlier today and was informed that report available for Dr. Gardiner Rhyme to view. Pt wanted to f/u with Dr. Gardiner Rhyme. Triage nurse printed latest [employer] reports for 12/8, 12/7, 12/5 and provided to Dr. Gardiner Rhyme.  reports reviewed by Dr. Gardiner Rhyme who notes PACs and recommended that pt proceed with upcoming coronary CTA scheduled 12/11 which will provide more info.  Pt contacted and updated. Pt verbalized understanding

## 2019-01-10 NOTE — Telephone Encounter (Signed)
°  Patient c/o Palpitations:  High priority if patient c/o lightheadedness, shortness of breath, or chest pain  1) How long have you had palpitations/irregular HR/ Afib? Are you having the symptoms now? For the past 12 hours  2) Are you currently experiencing lightheadedness, SOB or CP? no  3) Do you have a history of afib (atrial fibrillation) or irregular heart rhythm? no  4) Have you checked your BP or HR? (document readings if available): Patient does not check it regularly. Patient took a blood pressure reading while on the phone.   127/77 HR 81  5) Are you experiencing any other symptoms? Irregular HR  Patient is wearing a 30 day monitor and having some irregular rhythms. She called the monitor company this morning , and the monitor company told her that if the patient hit the button on her monitor, then the information would be downloaded and sent to the doctor early instead of waiting for the full results to come back. This is the first time that the patient was having symptoms while wearing the monitor. She just wanted to know if Dr. Gardiner Rhyme could take a look at those readings and make sense of it

## 2019-01-12 ENCOUNTER — Telehealth (HOSPITAL_COMMUNITY): Payer: Self-pay | Admitting: Emergency Medicine

## 2019-01-12 NOTE — Telephone Encounter (Signed)
Reaching out to patient to offer assistance regarding upcoming cardiac imaging study; pt verbalizes understanding of appt date/time, parking situation and where to check in, pre-test NPO status and medications ordered, and verified current allergies; name and call back number provided for further questions should they arise Mishel Sans RN Navigator Cardiac Imaging [facility] [phone] office [phone] cell 

## 2019-01-13 ENCOUNTER — Ambulatory Visit (HOSPITAL_COMMUNITY)
Admission: RE | Admit: 2019-01-13 | Discharge: 2019-01-13 | Disposition: A | Discharge status: Home / Self Care | Payer: BC Managed Care – PPO | Source: Ambulatory Visit | Attending: Cardiology | Admitting: Cardiology

## 2019-01-13 ENCOUNTER — Other Ambulatory Visit: Payer: Self-pay

## 2019-01-13 DIAGNOSIS — R072 Precordial pain: Secondary | ICD-10-CM | POA: Insufficient documentation

## 2019-01-13 MED ORDER — NITROGLYCERIN 0.4 MG SL SUBL
0.8000 mg | SUBLINGUAL_TABLET | Freq: Once | SUBLINGUAL | Status: AC
  Administered 2019-01-13: 0.8 mg via SUBLINGUAL

## 2019-01-13 MED ORDER — IOHEXOL 350 MG/ML SOLN
80.0000 mL | Freq: Once | INTRAVENOUS | Status: AC | PRN
  Administered 2019-01-13: 80 mL via INTRAVENOUS

## 2019-01-13 MED ORDER — NITROGLYCERIN 0.4 MG SL SUBL
SUBLINGUAL_TABLET | SUBLINGUAL | Status: AC
  Filled 2019-01-13: qty 2

## 2019-01-13 NOTE — Progress Notes (Signed)
CT scan completed. Tolerated well. D/C home walking, awake and alert. In no distress. 

## 2019-01-16 ENCOUNTER — Other Ambulatory Visit: Payer: Self-pay

## 2019-01-16 MED ORDER — ROSUVASTATIN CALCIUM 40 MG PO TABS: 40.0000 mg | ORAL_TABLET | Freq: Every day | ORAL | Status: DC

## 2019-01-18 ENCOUNTER — Telehealth: Payer: Self-pay | Admitting: Rheumatology

## 2019-01-18 MED ORDER — ZOLPIDEM TARTRATE 10 MG PO TABS: 10.0000 mg | ORAL_TABLET | Freq: Every day | ORAL | 0 refills | Status: DC

## 2019-01-18 MED ORDER — MELOXICAM 15 MG PO TABS: 15.0000 mg | ORAL_TABLET | Freq: Every day | ORAL | 0 refills | Status: DC

## 2019-01-18 MED ORDER — GABAPENTIN 300 MG PO CAPS: ORAL_CAPSULE | ORAL | 0 refills | Status: DC

## 2019-01-18 NOTE — Telephone Encounter (Signed)
Last Visit:09/21/2018 Next Visit: 03/22/2019  Last fill of ambien: 10/27/2018 (noted on new rx "do not fill until 01/26/2019").   Okay to refill ambien, gabapentin and mobic?

## 2019-01-18 NOTE — Telephone Encounter (Signed)
Patient needs refill on Ambien 10mg , 90 day supply (pt will be traveling when med is due for refill on 12/24. Please send in so can be picked when patient gets back in town.), Gabapentin  90 day supply, and Mobic 90 day supply sent to [facility].

## 2019-01-19 ENCOUNTER — Other Ambulatory Visit: Payer: Self-pay

## 2019-01-19 ENCOUNTER — Ambulatory Visit (INDEPENDENT_AMBULATORY_CARE_PROVIDER_SITE_OTHER): Payer: BC Managed Care – PPO | Admitting: Internal Medicine

## 2019-01-19 ENCOUNTER — Encounter: Payer: Self-pay | Admitting: Internal Medicine

## 2019-01-19 DIAGNOSIS — R0600 Dyspnea, unspecified: Secondary | ICD-10-CM | POA: Diagnosis not present

## 2019-01-19 DIAGNOSIS — R05 Cough: Secondary | ICD-10-CM

## 2019-01-19 DIAGNOSIS — Z7951 Long term (current) use of inhaled steroids: Secondary | ICD-10-CM

## 2019-01-19 DIAGNOSIS — J45991 Cough variant asthma: Secondary | ICD-10-CM

## 2019-01-19 MED ORDER — PANTOPRAZOLE SODIUM 40 MG PO TBEC: 40.0000 mg | DELAYED_RELEASE_TABLET | Freq: Every day | ORAL | 2 refills | Status: DC

## 2019-01-19 MED ORDER — FAMOTIDINE 20 MG PO TABS: ORAL_TABLET | ORAL | 2 refills | Status: DC

## 2019-01-19 MED ORDER — SYMBICORT 160-4.5 MCG/ACT IN AERO: INHALATION_SPRAY | RESPIRATORY_TRACT | 5 refills | Status: DC

## 2019-01-19 NOTE — Progress Notes (Signed)
Michelle Gray, female    DOB: [birth year],     MRN: [id number]   Brief patient profile:  15 yowf never smoker/NP for [facility]  with onset asthma around 1990 eval here around 2005 on prn saba rarely needed but esp in extremes of heat /cold but  not spring / fall and no assoc cough or need for maint  then around 2016 developed chest tightness and wheezing more of a chronic pattern improved p rx symb 80 2bid improved and rare saba then early 2020 noted more doe so around late Oct 2020 changed symb 160 and some better but still uncomfortable with deep breath so self referred back to pulmonary 12/22/2018 .     History of Present Illness  12/22/2018  Pulmonary/ 1st office eval/[facility]  Chief Complaint  Patient presents with  . Pulmonary Consult    Self referral- asthma- trouble taking a deep breath and wheezing. She is using her albuterol inhaler daily.   Dyspnea:  Ex bike not using and limited by fibromyalgia/ plantar fasciitis/ ok with yard work and steps / some chest discomfort midline with deep breath but not with ex Cough: not a lot but finds herself throat clearing x 30 years  " just her like her dad" no mucus production and only happens while awake  Sleep: no resp problem SABA use: p symb 160 x 2 6 am  Typically feels needs saba w/in a few hours  No nasal symptoms  rec Plan A = Automatic = Always=    symbicort 160 Take 2 puffs first thing in am and then another 2 puffs about 12 hours later.  Work on inhaler technique:    Plan B = Backup (to supplement plan A, not to replace it) Only use your albuterol inhaler as a rescue medication  Pantoprazole (protonix) 40 mg   Take  30-60 min before first meal of the day and Pepcid (famotidine)  20 mg one @  After supper  until return to office - this is the best way to tell whether stomach acid is contributing to your problem.   GERD diet      Virtual Visit via Telephone Note 01/19/2019   I connected with Michelle Gray on 01/19/19 at   8:30 AM EST by telephone and verified that I am speaking with the correct person using two identifiers.   I discussed the limitations, risks, security and privacy concerns of performing an evaluation and management service by telephone and the availability of in person appointments. I also discussed with the patient that there may be a patient responsible charge related to this service. The patient expressed understanding and agreed to proceed.   History of Present Illness: maint on symb 160 / am ppi  Dyspnea:  Limited aerobics / Not limited by breathing from desired activities   Cough: still some throat clearing when misses dose of h2 in pm/ also daytime x "25 years" Sleeping: no resp symptoms on flat bed/ one pillow  SABA use: none  02: none    No obvious day to day or daytime variability or assoc excess/ purulent sputum or mucus plugs or hemoptysis or cp or chest tightness, subjective wheeze or overt sinus or hb symptoms.    Also denies any obvious fluctuation of symptoms with weather or environmental changes or other aggravating or alleviating factors except as outlined above.   Meds reviewed/ med reconciliation completed         Observations/Objective: Sounds great on the phone, nl  voice texture   Assessment and Plan: See problem list for active a/p's   Follow Up Instructions: See avs for instructions unique to this ov which includes revised/ updated med list     I discussed the assessment and treatment plan with the patient. The patient was provided an opportunity to ask questions and all were answered. The patient agreed with the plan and demonstrated an understanding of the instructions.   The patient was advised to call back or seek an in-person evaluation if the symptoms worsen or if the condition fails to improve as anticipated.  I provided 23 minutes of non-face-to-face time during this encounter.   Christinia Gully, MD

## 2019-01-19 NOTE — Assessment & Plan Note (Signed)
?  Onset 1990s - initial pulmonary eval 2005 [facility] (paper chart requested) - worse since 2018 rx symb 80 2bid and increased to 160 2bid  Oct 2020 - spacer added 12/22/2018 as has component of uacs > improved 01/19/2019 but not resolved - 01/19/2019  rec titrate gabapentin to max of 300 qid to see if helps  All goals of chronic asthma control met including optimal function and elimination of symptoms with minimal need for rescue therapy.  Contingencies discussed in full including contacting this office immediately if not controlling the symptoms using the rule of two's.      Cough may be equal part uacs = Upper airway cough syndrome (previously labeled PNDS),  is so named because it's frequently impossible to sort out how much is  CR/sinusitis with freq throat clearing (which can be related to primary GERD)   vs  causing  secondary (" extra esophageal")  GERD from wide swings in gastric pressure that occur with throat clearing, often  promoting self use of mint and menthol lozenges that reduce the lower esophageal sphincter tone and exacerbate the problem further in a cyclical fashion.   These are the same pts (now being labeled as having "irritable larynx syndrome" by some cough centers) who not infrequently have a history of having failed to tolerate ace inhibitors,  dry powder inhalers or biphosphonates or report having atypical/extraesophageal reflux symptoms that don't respond to standard doses of PPI  and are easily confused as having aecopd or asthma flares by even experienced allergists/ pulmonologists (myself included) > gabapentin titration should address this.    >>>> f/u in 3 m to regroup with no other changes in interim unless needed    Each maintenance medication was reviewed in detail including most importantly the difference between maintenance and as needed and under what circumstances the prns are to be used.  Please see AVS for specific  Instructions which are unique to this visit  and I personally typed out  which were reviewed in detail over the phone with the patient and a copy provided via MyChart

## 2019-01-19 NOTE — Patient Instructions (Signed)
No change in respiratory symptoms   Add gabapentin 300 mg every morning to see if helps the daytime    Please schedule a follow up visit in 6 months but call sooner if needed

## 2019-01-20 ENCOUNTER — Telehealth: Payer: Self-pay | Admitting: Cardiology

## 2019-01-20 ENCOUNTER — Telehealth: Payer: Self-pay | Admitting: Rheumatology

## 2019-01-20 ENCOUNTER — Other Ambulatory Visit: Payer: Self-pay | Admitting: Physician Assistant

## 2019-01-20 NOTE — Telephone Encounter (Signed)
New message     Patient calling to report irregular feeling in Heart beat, wearing a heart monitor. No pain No other symptoms

## 2019-01-20 NOTE — Telephone Encounter (Signed)
Faxed

## 2019-01-20 NOTE — Telephone Encounter (Signed)
I spoke to the patient who said that she is presently wearing a monitor, which ends on 12/20.  She was notified of PACs on 12/8, by Dr Gardiner Rhyme.    This morning, she has experienced a "different feeling" of heartbeat and was wondering if a transmission was sent by [employer] to the [facility] office, for which she would need to be notified about.  She has no symptoms of CP, SOB or dizziness.  I told her that I would forward and she would be notified, if advisable.  She verbalized understanding

## 2019-01-20 NOTE — Telephone Encounter (Signed)
Patient called stating her prescription of Zolpidem was sent to the wrong pharmacy.  Patient is requesting the prescription be sent to [place] at [facility].  Patient states she realizes that she will not be able to pick up her prescription until 01/26/19.

## 2019-01-20 NOTE — Telephone Encounter (Signed)
Will follow-up monitor results

## 2019-01-23 ENCOUNTER — Other Ambulatory Visit: Payer: BC Managed Care – PPO

## 2019-01-23 ENCOUNTER — Ambulatory Visit (HOSPITAL_COMMUNITY): Payer: BC Managed Care – PPO

## 2019-01-23 NOTE — Telephone Encounter (Signed)
Spoke with pt who report monitor ended on Friday and has placed it in the mail for review. Pt states she had more extra beats today but denies SOB, CP or feeling dizzy.   Nurse informed pt that MD will review monitor once we receive it  and will call with any recommendations. Pt voiced understanding to call office if symptoms worsen.

## 2019-01-23 NOTE — Telephone Encounter (Addendum)
Follow up    Patient calling to report " extra beating" today and yesterday lasting about 2 minutes. Patient mailed monitor back yesterday, however wants advice on irregular feeling

## 2019-02-06 ENCOUNTER — Other Ambulatory Visit: Payer: BC Managed Care – PPO

## 2019-02-10 ENCOUNTER — Ambulatory Visit: Payer: BC Managed Care – PPO | Attending: Internal Medicine

## 2019-02-10 DIAGNOSIS — Z20822 Contact with and (suspected) exposure to covid-19: Secondary | ICD-10-CM

## 2019-02-12 LAB — NOVEL CORONAVIRUS, NAA: SARS-CoV-2, NAA: NOT DETECTED

## 2019-02-13 ENCOUNTER — Telehealth: Payer: Self-pay | Admitting: Cardiology

## 2019-02-13 NOTE — Telephone Encounter (Signed)
Follow Up: ° ° °Pt wants to know if her Monitor result is ready please? °

## 2019-02-17 ENCOUNTER — Other Ambulatory Visit: Payer: Self-pay

## 2019-03-10 ENCOUNTER — Other Ambulatory Visit: Payer: Self-pay

## 2019-03-10 ENCOUNTER — Ambulatory Visit: Payer: BC Managed Care – PPO | Admitting: Cardiology

## 2019-03-10 ENCOUNTER — Encounter: Payer: Self-pay | Admitting: Cardiology

## 2019-03-10 VITALS — BP 111/71 | HR 83 | Temp 97.1°F | Ht 67.0 in | Wt 146.0 lb

## 2019-03-10 DIAGNOSIS — R079 Chest pain, unspecified: Secondary | ICD-10-CM

## 2019-03-10 DIAGNOSIS — R002 Palpitations: Secondary | ICD-10-CM

## 2019-03-10 DIAGNOSIS — E785 Hyperlipidemia, unspecified: Secondary | ICD-10-CM

## 2019-03-10 DIAGNOSIS — I2510 Atherosclerotic heart disease of native coronary artery without angina pectoris: Secondary | ICD-10-CM | POA: Diagnosis not present

## 2019-03-10 NOTE — Patient Instructions (Signed)
Medication Instructions:  Resume rosuvastatin (Crestor) 40 mg daily  *If you need a refill on your cardiac medications before your next appointment, please call your pharmacy*  Lab Work: NONE  Testing/Procedures: NONE  Follow-Up: At [facility], you and your health needs are our priority.  As part of our continuing mission to provide you with exceptional heart care, we have created designated Provider Care Teams.  These Care Teams include your primary Cardiologist (physician) and Advanced Practice Providers (APPs -  Physician Assistants and Nurse Practitioners) who all work together to provide you with the care you need, when you need it.  Your next appointment:   3 month(s)  The format for your next appointment:   Either In Person or Virtual  Provider:   Oswaldo Milian, MD

## 2019-03-10 NOTE — Progress Notes (Signed)
Cardiology Office Note:    Date:  03/19/2019   ID:  Michelle Gray, DOB [birth year], MRN [id number]  PCP:  Cari Caraway, MD  Cardiologist:  No primary care provider on file.  Electrophysiologist:  None   Referring MD: Cari Caraway, MD   Chief Complaint  Patient presents with  . Chest Pain    History of Present Illness:    Michelle Gray is a 64 y.o. female with a hx of fibromyalgia, asthma, hyperlipidemia, breast cancer, hypothyroidism who returns for follow-up.  She was initially seen on 12/08/2018, she was referred by Dr. Addison Lank for evaluation of palpitations and chest pain.  She states that during episodes of palpitations she does not feel that her heart is racing but feels that her heart beat is irregular.  No lightheadedness or syncope during episodes.  She also reports that she had been having chest pressure.  Describes as pressure in center of her chest.  Has not noted relationship with exertion.  She has no smoking history.  Mother had atrial fibrillation.  No other family history of heart disease.  TSH was 0.93 on 10/23.  TTE was done on 12/20/2018, which showed normal LV systolic function, normal RV function, no significant valvular disease.  Cardiac monitor showed no significant arrhythmias, with the patient triggered events corresponding to sinus rhythm plus or minus PACs.  Coronary CTA was done on 01/14/2019, which showed nonobstructive CAD with calcified plaque in the proximal LAD causing minimal (0-24%) stenosis and noncalcified plaque in the proximal RCA causing minimal (0-24%) stenosis.  Calcium score was 32 (73rd percentile for age/gender).  Rosuvastatin was increased to 40 mg daily.   She reports that she stopped her rosuvastatin for about a week as was having myalgias, though this was also around the time she received her Covid vaccination.  Since her last clinic visit, she reports that she has been doing well.  Reports dyspnea has improved.  Denies any further chest  pain.  Reports that she has not been having palpitations since she wore her monitor.  Symptoms on monitor corresponded to PACs.  She reports that she was drinking more alcohol prior to Christmas, now down to 1 glass of wine per week.  Drinks three fourths of a coffee thermos each morning.    Past Medical History:  Diagnosis Date  . Anemia   . Arthritis   . Asthma   . Cancer ([place])   . Hyperlipidemia   . Neuromuscular disorder ([place])   . Osteoporosis   . Thyroid disease     Past Surgical History:  Procedure Laterality Date  . APPENDECTOMY    . BREAST SURGERY    . FOOT SURGERY    . LAPROSCOPIC    . WISDOM TEETH REMOVAL      Current Medications: Current Meds  Medication Sig  . 5-HTP CAPS Take 2 tablets by mouth at bedtime.  Marland Kitchen albuterol (PROVENTIL HFA;VENTOLIN HFA) 108 (90 BASE) MCG/ACT inhaler Inhale 2 puffs into the lungs every 6 (six) hours as needed for wheezing or shortness of breath.  . Biotin 5000 MCG CAPS Take 5,000 mcg by mouth daily.  . Cholecalciferol (VITAMIN D3) 5000 units CAPS Take 10,000 Units by mouth once a week.  . famotidine (PEPCID) 20 MG tablet One after supper  . gabapentin (NEURONTIN) 300 MG capsule TAKE TWO CAPSULES BY MOUTH DAILY AT BEDTIME  . Glucosamine 500 MG TABS Take 1,500 mg by mouth daily.    Marland Kitchen levothyroxine (SYNTHROID, LEVOTHROID) 50 MCG tablet Take  50 mcg by mouth every other day.    . levothyroxine (SYNTHROID, LEVOTHROID) 75 MCG tablet Take 75 mcg by mouth every other day.    . Magnesium 500 MG CAPS Take 500 mg by mouth daily.    . meloxicam (MOBIC) 15 MG tablet Take 1 tablet (15 mg total) by mouth daily.  . methocarbamol (ROBAXIN) 500 MG tablet Take 1 tablet (500 mg total) by mouth 2 (two) times daily as needed.  . Misc Natural Products (TART CHERRY ADVANCED PO) Take by mouth.  . pantoprazole (PROTONIX) 40 MG tablet Take 1 tablet (40 mg total) by mouth daily. Take 30-60 min before first meal of the day  . SYMBICORT 160-4.5 MCG/ACT inhaler Take 2  puffs first thing in am and then another 2 puffs about 12 hours later.  . TURMERIC PO Take 1 tablet by mouth daily.  Merril Abbe 10 MCG TABS vaginal tablet INSERT 1 TABLET VAGINALLY TWO TIMES A WEEK  . zolpidem (AMBIEN) 10 MG tablet Take 1 tablet (10 mg total) by mouth at bedtime.  . [DISCONTINUED] rosuvastatin (CRESTOR) 40 MG tablet Take 1 tablet (40 mg total) by mouth daily.     Allergies:   Compazine   Social History   Socioeconomic History  . Marital status: Married    Spouse name: Not on file  . Number of children: Not on file  . Years of education: Not on file  . Highest education level: Not on file  Occupational History  . Not on file  Tobacco Use  . Smoking status: Never Smoker  . Smokeless tobacco: Never Used  Substance and Sexual Activity  . Alcohol use: Yes    Comment: occ  . Drug use: Never  . Sexual activity: Not on file  Other Topics Concern  . Not on file  Social History Narrative  . Not on file   Social Determinants of Health   Financial Resource Strain:   . Difficulty of Paying Living Expenses: Not on file  Food Insecurity:   . Worried About Charity fundraiser in the Last Year: Not on file  . Ran Out of Food in the Last Year: Not on file  Transportation Needs:   . Lack of Transportation (Medical): Not on file  . Lack of Transportation (Non-Medical): Not on file  Physical Activity:   . Days of Exercise per Week: Not on file  . Minutes of Exercise per Session: Not on file  Stress:   . Feeling of Stress : Not on file  Social Connections:   . Frequency of Communication with Friends and Family: Not on file  . Frequency of Social Gatherings with Friends and Family: Not on file  . Attends Religious Services: Not on file  . Active Member of Clubs or Organizations: Not on file  . Attends Archivist Meetings: Not on file  . Marital Status: Not on file     Family History: The patient's family history includes Cancer in her mother; Cancer (age of  onset: 50) in her father; Dementia in her mother; Hyperlipidemia in her father and mother; Hypertension in her brother and mother; Prostate cancer in her father; Stroke in her father and mother.  ROS:   Please see the history of present illness.    All other systems reviewed and are negative.  EKGs/Labs/Other Studies Reviewed:    The following studies were reviewed today:   EKG:  EKG is ordered today.  The ekg ordered today demonstrates normal sinus rhythm, first-degree AV  block, left axis deviation, Q waves in V1-3  Recent Labs: No results found for requested labs within last 8760 hours.  Recent Lipid Panel No results found for: CHOL, TRIG, HDL, CHOLHDL, VLDL, LDLCALC, LDLDIRECT   TTE 12/20/18: 1. Left ventricular ejection fraction, by visual estimation, is 60 to  65%. The left ventricle has normal function. There is no left ventricular  hypertrophy. Normal diastolic function.  2. Global right ventricle has normal systolic function.The right  ventricular size is normal. No increase in right ventricular wall  thickness.  3. Left atrial size was normal.  4. Right atrial size was normal.  5. Moderate thickening of the mitral valve leaflet(s).  6. The mitral valve is normal in structure. Trace mitral valve  regurgitation.  7. The tricuspid valve is normal in structure. Tricuspid valve  regurgitation is trivial.  8. The aortic valve was not well visualized. Aortic valve regurgitation  is not visualized. No evidence of aortic valve sclerosis or stenosis.  9. The pulmonic valve was not well visualized. Pulmonic valve  regurgitation is not visualized.  10. Mildly elevated pulmonary artery systolic pressure.  11. The inferior vena cava is normal in size with greater than 50%  respiratory variability, suggesting right atrial pressure of 3 mmHg.  12. The tricuspid regurgitant velocity is 2.31 m/s, and with an assumed  right atrial pressure of 10 mmHg, the estimated right  ventricular systolic  pressure is mildly elevated at 31.3 mmHg.   CTA 01/14/19: 1. Coronary calcium score of 32. This was 50 percentile for age and sex matched control. 2.  Normal coronary origin with right dominance. 3. Nonobstructive CAD, with calcified plaque in the proximal LAD causing minimal (0-24%) stenosis and noncalcified plaque in the proximal RCA causing minimal (0-24%) stenosis  CAD-RADS 1. Minimal non-obstructive CAD (0-24%). Consider non-atherosclerotic causes of chest pain. Consider preventive therapy and risk factor modification.  Noncardiac: IMPRESSION: 1.  Aortic Atherosclerosis (ICD10-I70.0).  Cardiac monitor 02/16/19:  No significant arrhythmias  Patient triggered events corresponded to sinus rhythm +/- PACs   Predominant rhythm is sinus rhythm. Range is 58-142 bpm with average of 78 bpm. No atrial fibrillation, sustained ventricular tachycardia, significant pause, or high degree AV block. Total ectopy <1%. 20 patient triggered events, corresponding to sinus rhythm  +/- PACs  No significant abnormalities.  Physical Exam:    VS:  BP 111/71   Pulse 83   Temp (!) 97.1 F (36.2 C)   Ht 5\' 7"  (1.702 m)   Wt 146 lb (66.2 kg)   SpO2 99%   BMI 22.87 kg/m     Wt Readings from Last 3 Encounters:  03/10/19 146 lb (66.2 kg)  12/22/18 145 lb 12.8 oz (66.1 kg)  12/08/18 145 lb (65.8 kg)     GEN:  Well nourished, well developed in no acute distress HEENT: Normal NECK: No JVD; No carotid bruits LYMPHATICS: No lymphadenopathy CARDIAC: RRR, no murmurs, rubs, gallops RESPIRATORY:  Expiratory wheezing ABDOMEN: Soft, non-tender, non-distended MUSCULOSKELETAL:  No edema; No deformity  SKIN: Warm and dry NEUROLOGIC:  Alert and oriented x 3 PSYCHIATRIC:  Normal affect   ASSESSMENT:    1. Coronary artery disease involving native coronary artery of native heart without angina pectoris   2. Chest pain of uncertain etiology   3. Palpitations   4. Hyperlipidemia,  unspecified hyperlipidemia type    PLAN:     Coronary artery disease: Coronary CTA on 01/14/2019  showed nonobstructive CAD with calcified plaque in the proximal LAD causing minimal (  0-24%) stenosis and noncalcified plaque in the proximal RCA causing minimal (0-24%) stenosis.  Calcium score was 32 (73rd percentile for age/gender) -Rosuvastatin increased to 40 mg daily, goal LDL less than 70  Chest pain: Atypical in description, as describes substernal chest pressure but not related to exertion.  Coronary CTA showed nonobstructive CAD.  Reports chest pain has resolved.  Palpitations: Cardiac monitor showed symptoms corresponding to PACs.  Reports no recent palpitations.  No indication for treatment at this time  Hyperlipidemia: LDL 132 on 03/08/2019.  Rosuvastatin increased to 40 mg daily as above   RTC in 3 months   Medication Adjustments/Labs and Tests Ordered: Current medicines are reviewed at length with the patient today.  Concerns regarding medicines are outlined above.  No orders of the defined types were placed in this encounter.  No orders of the defined types were placed in this encounter.   Patient Instructions  Medication Instructions:  Resume rosuvastatin (Crestor) 40 mg daily  *If you need a refill on your cardiac medications before your next appointment, please call your pharmacy*  Lab Work: NONE  Testing/Procedures: NONE  Follow-Up: At [facility], you and your health needs are our priority.  As part of our continuing mission to provide you with exceptional heart care, we have created designated Provider Care Teams.  These Care Teams include your primary Cardiologist (physician) and Advanced Practice Providers (APPs -  Physician Assistants and Nurse Practitioners) who all work together to provide you with the care you need, when you need it.  Your next appointment:   3 month(s)  The format for your next appointment:   Either In Person or  Virtual  Provider:   Oswaldo Milian, MD      Signed, Michelle Heinz, MD  03/19/2019 3:04 PM    [place]

## 2019-03-13 ENCOUNTER — Other Ambulatory Visit: Payer: Self-pay | Admitting: Cardiology

## 2019-03-13 NOTE — Telephone Encounter (Signed)
This is Dr. Schumann's pt 

## 2019-03-13 NOTE — Telephone Encounter (Signed)
*  STAT* If patient is at the pharmacy, call can be transferred to refill team.   1. Which medications need to be refilled? (please list name of each medication and dose if known) Crestor 40 mg  2. Which pharmacy/location (including street and city if local pharmacy) is medication to be sent to? [facility]  3. Do they need a 30 day or 90 day supply? 90 days

## 2019-03-14 MED ORDER — ROSUVASTATIN CALCIUM 40 MG PO TABS: 40.0000 mg | ORAL_TABLET | Freq: Every day | ORAL | 3 refills | Status: DC

## 2019-03-14 NOTE — Telephone Encounter (Signed)
Rx has been sent to the pharmacy electronically. ° °

## 2019-03-16 NOTE — Progress Notes (Signed)
Office Visit Note  Patient: Michelle Gray             Date of Birth: 05-05-1955           MRN: [id number]             PCP: Cari Caraway, MD Referring: Cari Caraway, MD Visit Date: 03/22/2019 Occupation: @GUAROCC @  Subjective:  Joint stiffness.Marland Kitchen   History of Present Illness: Michelle Gray is a 64 y.o. female with a past medical history of fibromyalgia, osteoarthritis, and degenerative disc disease. Since her last visit, she began experiencing episodes of palpitations starting in late October. During this time she presented to her PCP and had a full cardiac workup. She was diagnosed with PACs at this time and was offered Diltiazem. She declined starting this medication due to significant improvement in her symptoms. She states that she experiences palpitations only occasionally now. Patient states she was under a lot of stress from work and family life during this time. She continues to experience trochanteric bursitis and piriformis syndrome. She also complains of achilles tendinitis that occurs with exercise. She endorses generalized joint pain, but overall states that her joints are doing well. She denies joint swelling or inflammation at this time. Endorses raynaud's phenomenon in bilateral hands. Patient states that she exercises often. She endorses difficulty sleeping. She currently takes vitamin D and calcium supplementation.   Activities of Daily Living:  Patient reports morning stiffness for 0 minutes.   Patient Reports nocturnal pain in lower back.  Difficulty dressing/grooming: Denies Difficulty climbing stairs: Denies Difficulty getting out of chair:  Denies Difficulty using hands for taps, buttons, cutlery, and/or writing: Reports with writing and opening jars.  Review of Systems  Constitutional: Positive for fatigue. Negative for appetite change, weight gain and weight loss.  HENT: Positive for mouth dryness. Negative for mouth sores.   Eyes: Positive for dryness.  Negative for pain.  Respiratory: Negative for shortness of breath and difficulty breathing.   Cardiovascular: Positive for palpitations (occasional). Negative for chest pain and swelling in legs/feet.  Gastrointestinal: Negative for abdominal pain, constipation and diarrhea.  Endocrine: Positive for cold intolerance.  Genitourinary: Negative for decreased urine output and difficulty urinating.  Musculoskeletal: Positive for arthralgias and joint pain. Negative for joint swelling and morning stiffness.  Skin: Positive for color change (Raynaud's phenomenon).  Neurological: Negative for dizziness, numbness and weakness.  Psychiatric/Behavioral: Negative for confusion. The patient is not nervous/anxious.     PMFS History:  Patient Active Problem List   Diagnosis Date Noted  . Cough variant asthma with ? component upper airway cough syndrome 12/23/2018  . Primary osteoarthritis of both hands 02/21/2016  . Fibromyalgia 02/21/2016  . History of hypothyroidism 02/21/2016  . Dyslipidemia 02/21/2016  . History of breast cancer 02/21/2016  . Age-related osteoporosis without current pathological fracture 02/21/2016  . Unspecified hypothyroidism 08/06/2013  . Other and unspecified hyperlipidemia 08/06/2013  . Insomnia 08/06/2013  . Intrinsic asthma 08/06/2013  . Hip pain, left 01/12/2011  . Plantar fasciitis 01/12/2011    Past Medical History:  Diagnosis Date  . Anemia   . Arthritis   . Asthma   . Cancer ([place])   . Hyperlipidemia   . Neuromuscular disorder ([place])   . Osteoporosis   . Thyroid disease     Family History  Problem Relation Age of Onset  . Cancer Mother   . Hyperlipidemia Mother   . Hypertension Mother   . Stroke Mother   . Dementia Mother   .  Hyperlipidemia Father   . Stroke Father   . Prostate cancer Father   . Cancer Father 25       Prostate  . Hypertension Brother    Past Surgical History:  Procedure Laterality Date  . APPENDECTOMY    . BREAST SURGERY    .  FOOT SURGERY    . LAPROSCOPIC    . WISDOM TEETH REMOVAL     Social History   Social History Narrative  . Not on file   Immunization History  Administered Date(s) Administered  . Influenza-Unspecified 10/04/2018     Objective: Vital Signs: BP 102/64 (BP Location: Left Arm, Patient Position: Sitting, Cuff Size: Small)   Pulse 69   Resp 12   Ht 5\' 7"  (1.702 m)   Wt 146 lb 9.6 oz (66.5 kg)   BMI 22.96 kg/m    Physical Exam Vitals and nursing note reviewed.  Constitutional:      Appearance: She is well-developed.  HENT:     Head: Normocephalic and atraumatic.  Eyes:     Conjunctiva/sclera: Conjunctivae normal.  Cardiovascular:     Rate and Rhythm: Normal rate and regular rhythm.     Heart sounds: Normal heart sounds.  Pulmonary:     Effort: Pulmonary effort is normal.     Breath sounds: Normal breath sounds.  Abdominal:     General: Bowel sounds are normal.     Palpations: Abdomen is soft.  Musculoskeletal:     Cervical back: Normal range of motion.  Lymphadenopathy:     Cervical: No cervical adenopathy.  Skin:    General: Skin is warm and dry.     Capillary Refill: Capillary refill takes 2 to 3 seconds. No nailbed capillary changes were noted.  No sclerodactyly was noted. Neurological:     Mental Status: She is alert and oriented to person, place, and time.  Psychiatric:        Behavior: Behavior normal.      Musculoskeletal Exam: C-spine thoracic and lumbar spine were in good range of motion.  Shoulder joints elbow joints wrist joints MCPs PIPs DIPs in good range of motion with no synovitis.  Hip joints knee joints ankles MTPs PIPs with good range of motion with no synovitis.  She had mild tenderness over right medial epicondyle and bilateral trochanteric bursa.  CDAI Exam: CDAI Score: -- Patient Global: --; Provider Global: -- Swollen: --; Tender: -- Joint Exam 03/22/2019   No joint exam has been documented for this visit   There is currently no  information documented on the homunculus. Go to the Rheumatology activity and complete the homunculus joint exam.  Investigation: No additional findings.  Imaging: No results found.  Recent Labs: Lab Results  Component Value Date   WBC 16.8 (H) 08/06/2013   HGB 13.4 08/06/2013   PLT 391 08/06/2013   March 06, 2019 labs done at [facility] showed CBC normal, CMP normal, LDL 132, TSH normal  Speciality Comments: No specialty comments available.  Procedures:  No procedures performed Allergies: Compazine   Assessment / Plan:     Visit Diagnoses: Fibromyalgia-patient continues to have some generalized pain and discomfort which is tolerable.  Other fatigue-she has chronic fatigue.  Primary insomnia -better with combination of gabapentin, and ambien  Raynaud's disease without gangrene-she gives history of mild Raynaud's symptoms.  She has no other clinical features of autoimmune disease.  Warm clothing and warmer core temperature was discussed.  Medial epicondylitis of right elbow-she has recurrent issue with the right  medial epicondylitis.  She had mild tenderness on examination today.  I believe is related to her computer use.  Trochanteric bursitis of both hips-she continues to have some discomfort in trochanteric area.  She has been doing the stretching exercises which is helpful.  Primary osteoarthritis of both hands-she has mild osteoarthritic changes.  Plantar fasciitis-doing better currently.  DDD (degenerative disc disease), lumbar - L5-S1 disc disease on MRI 2008, facet joint arthropathy. meloxicam 50 mg p.o. daily has been helpful.  Osteopenia of multiple sites - according to patient the most recent bone density was in the osteopenia range.  She had GI intolerance to bisphosphonates in the past.  Other medical problems are listed as follows:  History of hypothyroidism  History of hyperlipidemia  History of breast cancer  Orders: No orders of the defined  types were placed in this encounter.  No orders of the defined types were placed in this encounter.     Follow-Up Instructions: Return in about 6 months (around 09/19/2019) for Osteoarthritis,FMS.   Bo Merino, MD  Note - This record has been created using Editor, commissioning.  Chart creation errors have been sought, but may not always  have been located. Such creation errors do not reflect on  the standard of medical care.

## 2019-03-20 ENCOUNTER — Telehealth: Payer: Self-pay | Admitting: Internal Medicine

## 2019-03-20 NOTE — Telephone Encounter (Signed)
Called and spoke to pt. Informed her we do not have any Symbicort coupons at this time. Pt verbalized understanding and denied any further questions or concerns at this time.

## 2019-03-22 ENCOUNTER — Ambulatory Visit: Payer: BC Managed Care – PPO | Admitting: Rheumatology

## 2019-03-22 ENCOUNTER — Other Ambulatory Visit: Payer: Self-pay

## 2019-03-22 ENCOUNTER — Encounter: Payer: Self-pay | Admitting: Rheumatology

## 2019-03-22 VITALS — BP 102/64 | HR 69 | Resp 12 | Ht 67.0 in | Wt 146.6 lb

## 2019-03-22 DIAGNOSIS — M7061 Trochanteric bursitis, right hip: Secondary | ICD-10-CM

## 2019-03-22 DIAGNOSIS — M797 Fibromyalgia: Secondary | ICD-10-CM

## 2019-03-22 DIAGNOSIS — M51369 Other intervertebral disc degeneration, lumbar region without mention of lumbar back pain or lower extremity pain: Secondary | ICD-10-CM

## 2019-03-22 DIAGNOSIS — M5136 Other intervertebral disc degeneration, lumbar region: Secondary | ICD-10-CM

## 2019-03-22 DIAGNOSIS — I7300 Raynaud's syndrome without gangrene: Secondary | ICD-10-CM | POA: Diagnosis not present

## 2019-03-22 DIAGNOSIS — M19042 Primary osteoarthritis, left hand: Secondary | ICD-10-CM

## 2019-03-22 DIAGNOSIS — F5101 Primary insomnia: Secondary | ICD-10-CM

## 2019-03-22 DIAGNOSIS — Z853 Personal history of malignant neoplasm of breast: Secondary | ICD-10-CM

## 2019-03-22 DIAGNOSIS — M722 Plantar fascial fibromatosis: Secondary | ICD-10-CM

## 2019-03-22 DIAGNOSIS — M7701 Medial epicondylitis, right elbow: Secondary | ICD-10-CM

## 2019-03-22 DIAGNOSIS — R5383 Other fatigue: Secondary | ICD-10-CM | POA: Diagnosis not present

## 2019-03-22 DIAGNOSIS — M19041 Primary osteoarthritis, right hand: Secondary | ICD-10-CM

## 2019-03-22 DIAGNOSIS — Z8639 Personal history of other endocrine, nutritional and metabolic disease: Secondary | ICD-10-CM

## 2019-03-22 DIAGNOSIS — M8589 Other specified disorders of bone density and structure, multiple sites: Secondary | ICD-10-CM

## 2019-03-22 DIAGNOSIS — M7062 Trochanteric bursitis, left hip: Secondary | ICD-10-CM

## 2019-03-27 ENCOUNTER — Telehealth: Payer: Self-pay | Admitting: Cardiology

## 2019-03-27 MED ORDER — ROSUVASTATIN CALCIUM 20 MG PO TABS: 20.0000 mg | ORAL_TABLET | Freq: Every day | ORAL | 3 refills | Status: DC

## 2019-03-27 MED ORDER — EZETIMIBE 10 MG PO TABS: 10.0000 mg | ORAL_TABLET | Freq: Every day | ORAL | 3 refills | Status: DC

## 2019-03-27 NOTE — Addendum Note (Signed)
Addended by: Patria Mane A on: 03/27/2019 04:23 PM   Modules accepted: Orders

## 2019-03-27 NOTE — Telephone Encounter (Signed)
Patient states that per her last conversation with Dr. Gardiner Rhyme, she would like to stay on the Crestor 20mg  and add Zetia. She will need a prescription sent in to [facility] for a 90 day supply once approved. She also wants to make Dr. Gardiner Rhyme aware that on MyChart she is unable to send Dr. Gardiner Rhyme a direct message, it only gives her the option to message two physicians.

## 2019-03-27 NOTE — Telephone Encounter (Signed)
Returned call to patient-she states at [place] with Dr. Gardiner Rhyme they discussed increasing crestor to 40 mg daily or staying at 20mg  and adding zetia.   She had some muscle aches and pains with the 40 mg but also had the covid vaccine at the same time so was unsure what to contribute symptoms to.  She was going to try the 40 mg again.  She has visit with rheumatologist yesterday who recommended staying on 20mg  and adding zetia.   Patient requesting rx for these sent to [facility] of Dr. Gardiner Rhyme ok with this.   Also states she is unable to send message to Dr. Gardiner Rhyme via [place].    Advised this is an ongoing issue that I am currently working on, apologized for the inconvenience.   Advised will send message via Mychart so she can respond to communicate with Dr. Gardiner Rhyme until this is resolved.  Patient aware.

## 2019-03-27 NOTE — Telephone Encounter (Signed)
Yes that is fine Gerald Stabs

## 2019-03-27 NOTE — Addendum Note (Signed)
Addended by: Patria Mane A on: 03/27/2019 05:36 PM   Modules accepted: Orders

## 2019-03-28 ENCOUNTER — Encounter: Payer: Self-pay | Admitting: *Deleted

## 2019-03-28 ENCOUNTER — Other Ambulatory Visit: Payer: Self-pay

## 2019-03-28 MED ORDER — ROSUVASTATIN CALCIUM 20 MG PO TABS: 20.0000 mg | ORAL_TABLET | Freq: Every day | ORAL | 3 refills | Status: DC

## 2019-03-28 MED ORDER — EZETIMIBE 10 MG PO TABS: 10.0000 mg | ORAL_TABLET | Freq: Every day | ORAL | 3 refills | Status: DC

## 2019-03-28 NOTE — Addendum Note (Signed)
Addended by: Patria Mane A on: 03/28/2019 10:23 AM   Modules accepted: Orders

## 2019-03-28 NOTE — Telephone Encounter (Signed)
Rx called into pharmacy (unable to Eprescribe)-patient message sent to make aware.

## 2019-03-28 NOTE — Addendum Note (Signed)
Addended by: Patria Mane A on: 03/28/2019 10:55 AM   Modules accepted: Orders

## 2019-05-01 ENCOUNTER — Telehealth: Payer: Self-pay | Admitting: Rheumatology

## 2019-05-01 MED ORDER — GABAPENTIN 300 MG PO CAPS: ORAL_CAPSULE | ORAL | 0 refills | Status: DC

## 2019-05-01 MED ORDER — ZOLPIDEM TARTRATE 10 MG PO TABS: 10.0000 mg | ORAL_TABLET | Freq: Every day | ORAL | 0 refills | Status: DC

## 2019-05-01 MED ORDER — MELOXICAM 15 MG PO TABS: 15.0000 mg | ORAL_TABLET | Freq: Every day | ORAL | 0 refills | Status: DC

## 2019-05-01 NOTE — Telephone Encounter (Signed)
Reviewed CMP from 03/07/19.  LFTs and creatinine/GFR WNL.   Ok to refill Mobic and gabapentin.

## 2019-05-01 NOTE — Telephone Encounter (Signed)
Patient requests a refill on Gabapentin, Ambien, and Mobic for 3 month supply sent to [facility] [address]. This is a new pharmacy. Patient no longer using [facility].

## 2019-05-01 NOTE — Telephone Encounter (Signed)
Last Visit: 03/22/19 Next Visit: 09/20/19  Okay to refill Ambien, Mobic and Gabapentin?

## 2019-05-29 ENCOUNTER — Telehealth: Payer: Self-pay | Admitting: Rheumatology

## 2019-05-29 NOTE — Telephone Encounter (Signed)
I called patient and advised we could schedule a sooner office visit or refer her to [facility] for evaluation. Patient states she now has an appointment with her PCP this morning.

## 2019-05-29 NOTE — Telephone Encounter (Signed)
She can schedule a sooner office visit with [place] or we can refer her to a provider at [facility] for further evaluation.

## 2019-05-29 NOTE — Telephone Encounter (Signed)
Patient having lower back pain, numbness on top of lt foot, and some hip pain. Pain has been going on for a couple of weeks now with NKI. Patient did not know whom to see for this. Please call to advise.

## 2019-06-15 ENCOUNTER — Ambulatory Visit: Payer: BC Managed Care – PPO | Admitting: Cardiology

## 2019-07-31 ENCOUNTER — Other Ambulatory Visit: Payer: Self-pay | Admitting: Rheumatology

## 2019-07-31 MED ORDER — GABAPENTIN 300 MG PO CAPS: ORAL_CAPSULE | ORAL | 0 refills | Status: DC

## 2019-07-31 MED ORDER — ZOLPIDEM TARTRATE 10 MG PO TABS: 10.0000 mg | ORAL_TABLET | Freq: Every day | ORAL | 0 refills | Status: DC

## 2019-07-31 NOTE — Telephone Encounter (Signed)
Please schedule an office visit to discuss changing the dose of gabapentin.   Ok to refill gabapentin at current dose in the meantime.   I will send in the refill of ambien.

## 2019-07-31 NOTE — Telephone Encounter (Signed)
Patient advised she will need an appointment to discuss increasing Gabapentin. Patient has an appointment on 09/20/2019. Patient would like to keep that appointment as she is out of town. Noted the appointment to discuss gabapentin increase. Patient advised prescription has been sent to the pharmacy for Ambien and the current dose of Gabapentin.

## 2019-07-31 NOTE — Telephone Encounter (Signed)
Patient requests a refill on #1.Ambien 10 mg 3 month supply, and #2.Gabapentin 3 times per day (recommended by Pulmonologist) for 3 month supply. Please send to [facility] on [address].

## 2019-07-31 NOTE — Telephone Encounter (Signed)
Last Visit: 03/22/19 Next Visit: 09/20/19  Last Fill: 05/01/2019  Current prescription for Gabapentin 300 mg TAKE TWO CAPSULES BY MOUTH DAILY AT BEDTIME Patient requesting an increase on Gabapentin   Please advise okay to refill Ambien and Gabapentin and with which directions.

## 2019-08-08 ENCOUNTER — Other Ambulatory Visit: Payer: Self-pay | Admitting: *Deleted

## 2019-08-08 MED ORDER — MELOXICAM 15 MG PO TABS: 15.0000 mg | ORAL_TABLET | Freq: Every day | ORAL | 0 refills | Status: DC

## 2019-08-08 NOTE — Telephone Encounter (Signed)
Refill request received via fax  Last Visit: 03/22/19 Next Visit: 09/20/19 Labs: 03/17/2019 WNL  Okay to refill per Dr. Estanislado Pandy

## 2019-08-21 ENCOUNTER — Other Ambulatory Visit: Payer: Self-pay | Admitting: Internal Medicine

## 2019-09-06 ENCOUNTER — Telehealth: Payer: Self-pay | Admitting: *Deleted

## 2019-09-06 NOTE — Telephone Encounter (Signed)
Labs received from Dr. Benjie Karvonen  Reviewed by Hazel Sams, PA-C  Drawn on 09/04/2019  CBC/CMP/Lipid Panel/TSH  Total Cholesterol 212 LDL Chol Calc 124  All other labs WNL  Patient on Meloxicam 15 mg daily, Gabapentin 300 mg two capsules by mouth daily at bedtime and Ambien 10 mg at bedtime.

## 2019-09-08 NOTE — Progress Notes (Signed)
Office Visit Note  Patient: Michelle Gray             Date of Birth: [birth year]           MRN: [id number]             PCP: Cari Caraway, MD Referring: Cari Caraway, MD Visit Date: 09/20/2019 Occupation: @GUAROCC @  Subjective:  Other (right hand pain )   History of Present Illness: Michelle Gray is a 64 y.o. female with history of osteoarthritis, degenerative disc disease and fibromyalgia.  She states about 2 weeks ago she started having lower back pain and she was having difficulty sitting for a long time.  She was seen by her PCP and went to physical therapy.  She was also evaluated by Dr. Maxie Better and had MRI of her lower back.  Which showed mostly showed facet joint arthropathy.  She states while doing physical therapy her symptoms got worse with increased lower back pain.  She stopped going to the physical therapy and she has been doing some stretches at home which has been helpful.  She has been experiencing some discomfort in her right hand especially over the right second MCP joint.  She has had discomfort in her left jaw in the past and was seen by Dr. Lynann Bologna few years back.  She has been using topical Voltaren gel which has been helpful.  Activities of Daily Living:  Patient reports morning stiffness for  A few  minutes.   Patient Reports nocturnal pain.  Difficulty dressing/grooming: Denies Difficulty climbing stairs: Denies Difficulty getting out of chair: Denies Difficulty using hands for taps, buttons, cutlery, and/or writing: Reports  Review of Systems  Constitutional: Positive for fatigue.  HENT: Positive for mouth dryness. Negative for mouth sores and nose dryness.   Eyes: Positive for dryness.  Respiratory: Negative for shortness of breath and difficulty breathing.   Cardiovascular: Negative for chest pain and palpitations.  Gastrointestinal: Negative for blood in stool, constipation and diarrhea.  Endocrine: Negative for increased urination.  Genitourinary:  Negative for difficulty urinating.  Musculoskeletal: Positive for arthralgias, joint pain, myalgias, morning stiffness and myalgias. Negative for joint swelling and muscle tenderness.  Skin: Negative for color change, rash and redness.  Allergic/Immunologic: Negative for susceptible to infections.  Neurological: Negative for dizziness, numbness, headaches, memory loss and weakness.  Hematological: Positive for bruising/bleeding tendency.  Psychiatric/Behavioral: Negative for confusion.    PMFS History:  Patient Active Problem List   Diagnosis Date Noted   Cough variant asthma with ? component upper airway cough syndrome 12/23/2018   Primary osteoarthritis of both hands 02/21/2016   Fibromyalgia 02/21/2016   History of hypothyroidism 02/21/2016   Dyslipidemia 02/21/2016   History of breast cancer 02/21/2016   Age-related osteoporosis without current pathological fracture 02/21/2016   Unspecified hypothyroidism 08/06/2013   Other and unspecified hyperlipidemia 08/06/2013   Insomnia 08/06/2013   Intrinsic asthma 08/06/2013   Hip pain, left 01/12/2011   Plantar fasciitis 01/12/2011    Past Medical History:  Diagnosis Date   Anemia    Arthritis    Asthma    Cancer ([place])    Hyperlipidemia    Neuromuscular disorder ([place])    Osteoporosis    Thyroid disease     Family History  Problem Relation Age of Onset   Cancer Mother    Hyperlipidemia Mother    Hypertension Mother    Stroke Mother    Dementia Mother    Hyperlipidemia Father    Stroke Father  Prostate cancer Father    Cancer Father 83       Prostate   Hypertension Brother    Past Surgical History:  Procedure Laterality Date   APPENDECTOMY     BREAST SURGERY     FOOT SURGERY     LAPROSCOPIC     WISDOM TEETH REMOVAL     Social History   Social History Narrative   Not on file   Immunization History  Administered Date(s) Administered   Influenza-Unspecified 10/04/2018      Objective: Vital Signs: BP 103/65 (BP Location: Left Arm, Patient Position: Sitting, Cuff Size: Small)    Pulse 78    Resp 13    Ht 5' 7.25" (1.708 m)    Wt 151 lb 9.6 oz (68.8 kg)    BMI 23.57 kg/m    Physical Exam Vitals and nursing note reviewed.  Constitutional:      Appearance: She is well-developed.  HENT:     Head: Normocephalic and atraumatic.  Eyes:     Conjunctiva/sclera: Conjunctivae normal.  Cardiovascular:     Rate and Rhythm: Normal rate and regular rhythm.     Heart sounds: Normal heart sounds.  Pulmonary:     Effort: Pulmonary effort is normal.     Breath sounds: Normal breath sounds.  Abdominal:     General: Bowel sounds are normal.     Palpations: Abdomen is soft.  Musculoskeletal:     Cervical back: Normal range of motion.  Lymphadenopathy:     Cervical: No cervical adenopathy.  Skin:    General: Skin is warm and dry.     Capillary Refill: Capillary refill takes less than 2 seconds.  Neurological:     Mental Status: She is alert and oriented to person, place, and time.  Psychiatric:        Behavior: Behavior normal.      Musculoskeletal Exam: C-spine was in good range of motion.  She has some discomfort range of motion of her lumbar spine.  Shoulder joints, elbow joints, wrist joints with good range of motion.  She has mild tenderness in the webspace between the index and the middle finger.  No synovitis was noted.  Hip joints, knee joints, ankles were in good range of motion.  She had tenderness over bilateral trochanteric bursa and her plantar fascia.  CDAI Exam: CDAI Score: -- Patient Global: --; Provider Global: -- Swollen: --; Tender: -- Joint Exam 09/20/2019   No joint exam has been documented for this visit   There is currently no information documented on the homunculus. Go to the Rheumatology activity and complete the homunculus joint exam.  Investigation: No additional findings.  Imaging: No results found.  Recent Labs: Lab  Results  Component Value Date   WBC 16.8 (H) 08/06/2013   HGB 13.4 08/06/2013   PLT 391 08/06/2013   MRI done at [facility] on August 17, 2019 showed moderate to prominent moderate multilevel facet joint arthropathy with a small multilevel facet effusions. No significant foraminal canal or spinal canal stenosis was noted.  Mild to moderate disc bulges were noted. Speciality Comments: No specialty comments available.  Procedures:  No procedures performed Allergies: Compazine   Assessment / Plan:     Visit Diagnoses: Fibromyalgia -she continues to have some generalized pain and discomfort.  She has been on meloxicam, gabapentin, ambien, robaxin combination which controls her symptoms.  We had detailed discussion regarding side effects of all these medications.  She will try to taper these medications gradually.  She takes Robaxin only on as needed basis.  Other fatigue-due to fibromyalgia and chronic insomnia.  Primary insomnia-she is on Ambien 10 mg p.o. nightly.  She will try to reduce it to 5 mg p.o. nightly.  She is planning to retire in 1 year.  Raynaud's disease without gangrene-currently not active.  Trochanteric bursitis of both hips-stretching exercises were advised.  Pain in right hand -she complains of some discomfort between her right index and middle finger webspace.  She does Mickel Baas pelvic examinations and uses speculum as a provider.  That could be putting extra stress on her hand.  I will obtain additional labs to look for any inflammatory process.  Plan: Sedimentation rate, Rheumatoid factor, Cyclic citrul peptide antibody, IgG, Uric acid, ANA  Primary osteoarthritis of both hands-joint protection was discussed.  Plantar fasciitis-she has chronic plantar fasciitis.  Proper fitting shoes were discussed.  DDD (degenerative disc disease), lumbar - L5-S1 disc disease on MRI 2008, facet joint arthropathy.  She had recent MRI which was reviewed with her.  She has multilevel  spondylosis and facet joint arthropathy.  Meloxicam 50 mg p.o. daily has been helpful.  Several exercises which were demonstrated in the office today.  Osteopenia of multiple sites - according to patient the most recent bone density was in the osteopenia range.  She had GI intolerance to bisphosphonates in the past.  History of hypothyroidism  History of breast cancer  History of hyperlipidemia  Orders: Orders Placed This Encounter  Procedures   Sedimentation rate   Rheumatoid factor   Cyclic citrul peptide antibody, IgG   Uric acid   ANA   Meds ordered this encounter  Medications   methocarbamol (ROBAXIN) 500 MG tablet    Sig: 1 tablet p.o. nightly as needed    Dispense:  30 tablet    Refill:  0      Follow-Up Instructions: Return in about 6 months (around 03/22/2020) for Osteoarthritis, DDD, FMS.   Bo Merino, MD  Note - This record has been created using Editor, commissioning.  Chart creation errors have been sought, but may not always  have been located. Such creation errors do not reflect on  the standard of medical care.

## 2019-09-15 NOTE — Progress Notes (Signed)
Cardiology Office Note:    Date:  09/18/2019   ID:  Michelle Gray, DOB 04/21/1955, MRN [id number]  PCP:  Cari Caraway, MD  Cardiologist:  Donato Heinz, MD  Electrophysiologist:  None   Referring MD: Cari Caraway, MD   Chief Complaint  Patient presents with  . Coronary Artery Disease    History of Present Illness:    Michelle Gray is a 64 y.o. female with a hx of fibromyalgia, asthma, hyperlipidemia, breast cancer, hypothyroidism who returns for follow-up.  She was initially seen on 12/08/2018, she was referred by Dr. Addison Lank for evaluation of palpitations and chest pain.  She states that during episodes of palpitations she does not feel that her heart is racing but feels that her heart beat is irregular.  No lightheadedness or syncope during episodes.  She also reports that she had been having chest pressure.  Describes as pressure in center of her chest.  Has not noted relationship with exertion.  She has no smoking history.  Mother had atrial fibrillation.  No other family history of heart disease.  TSH was 0.93 on 10/23.  TTE was done on 12/20/2018, which showed normal LV systolic function, normal RV function, no significant valvular disease.  Cardiac monitor showed no significant arrhythmias, with the patient triggered events corresponding to sinus rhythm plus or minus PACs.  Coronary CTA was done on 01/14/2019, which showed nonobstructive CAD with calcified plaque in the proximal LAD causing minimal (0-24%) stenosis and noncalcified plaque in the proximal RCA causing minimal (0-24%) stenosis.  Calcium score was 32 (73rd percentile for age/gender).  Rosuvastatin was increased to 40 mg daily.   Since last clinic visit, she reports that she has been doing well.  Chest pain and palpitations have resolved.  She has not been exercising due to her plantar fasciitis.  Reports that she developed myalgias on rosuvastatin 40 mg daily and dose was decreased to 20 mg daily.  Zetia  was added but she states that she also had myalgias with Zetia.  She stopped taking her statin altogether but has been back on rosuvastatin 20 mg daily for the last 4 to 5 months.  Labs on 09/04/2019 showed LDL 124.    Past Medical History:  Diagnosis Date  . Anemia   . Arthritis   . Asthma   . Cancer ([place])   . Hyperlipidemia   . Neuromuscular disorder ([place])   . Osteoporosis   . Thyroid disease     Past Surgical History:  Procedure Laterality Date  . APPENDECTOMY    . BREAST SURGERY    . FOOT SURGERY    . LAPROSCOPIC    . WISDOM TEETH REMOVAL      Current Medications: Current Meds  Medication Sig  . 5-HTP CAPS Take 2 tablets by mouth at bedtime.  Marland Kitchen albuterol (PROVENTIL HFA;VENTOLIN HFA) 108 (90 BASE) MCG/ACT inhaler Inhale 2 puffs into the lungs every 6 (six) hours as needed for wheezing or shortness of breath.  . Biotin 5000 MCG CAPS Take 5,000 mcg by mouth daily.  . Cholecalciferol (VITAMIN D3) 5000 units CAPS Take 10,000 Units by mouth once a week.  . gabapentin (NEURONTIN) 300 MG capsule TAKE TWO CAPSULES BY MOUTH DAILY AT BEDTIME  . Glucosamine 500 MG TABS Take 1,500 mg by mouth daily.    Marland Kitchen levothyroxine (SYNTHROID, LEVOTHROID) 50 MCG tablet Take 50 mcg by mouth every other day.    . levothyroxine (SYNTHROID, LEVOTHROID) 75 MCG tablet Take 75 mcg by mouth  every other day.    . Magnesium 500 MG CAPS Take 500 mg by mouth daily.    . meloxicam (MOBIC) 15 MG tablet Take 1 tablet (15 mg total) by mouth daily.  . methocarbamol (ROBAXIN) 500 MG tablet Take 1 tablet (500 mg total) by mouth 2 (two) times daily as needed.  . Misc Natural Products (TART CHERRY ADVANCED PO) Take by mouth.  . rosuvastatin (CRESTOR) 20 MG tablet Take 1 tablet (20 mg total) by mouth daily.  . SYMBICORT 160-4.5 MCG/ACT inhaler INHALE 2 PUFFS BY MOUTH IN THE MORNING AND THEN ANOTHER 2 PUFFS ABOUT 12 HOURS LATER  . TURMERIC PO Take 1 tablet by mouth daily.  Marland Kitchen zolpidem (AMBIEN) 10 MG tablet Take 1 tablet  (10 mg total) by mouth at bedtime.     Allergies:   Compazine   Social History   Socioeconomic History  . Marital status: Married    Spouse name: Not on file  . Number of children: Not on file  . Years of education: Not on file  . Highest education level: Not on file  Occupational History  . Not on file  Tobacco Use  . Smoking status: Never Smoker  . Smokeless tobacco: Never Used  Vaping Use  . Vaping Use: Never used  Substance and Sexual Activity  . Alcohol use: Yes    Comment: occ  . Drug use: Never  . Sexual activity: Not on file  Other Topics Concern  . Not on file  Social History Narrative  . Not on file   Social Determinants of Health   Financial Resource Strain:   . Difficulty of Paying Living Expenses:   Food Insecurity:   . Worried About Charity fundraiser in the Last Year:   . Arboriculturist in the Last Year:   Transportation Needs:   . Film/video editor (Medical):   Marland Kitchen Lack of Transportation (Non-Medical):   Physical Activity:   . Days of Exercise per Week:   . Minutes of Exercise per Session:   Stress:   . Feeling of Stress :   Social Connections:   . Frequency of Communication with Friends and Family:   . Frequency of Social Gatherings with Friends and Family:   . Attends Religious Services:   . Active Member of Clubs or Organizations:   . Attends Archivist Meetings:   Marland Kitchen Marital Status:      Family History: The patient's family history includes Cancer in her mother; Cancer (age of onset: 63) in her father; Dementia in her mother; Hyperlipidemia in her father and mother; Hypertension in her brother and mother; Prostate cancer in her father; Stroke in her father and mother.  ROS:   Please see the history of present illness.    All other systems reviewed and are negative.  EKGs/Labs/Other Studies Reviewed:    The following studies were reviewed today:   EKG:  EKG is ordered today.  The ekg ordered today demonstrates normal  sinus rhythm, first-degree AV block, left axis deviation, rate 80  Recent Labs: No results found for requested labs within last 8760 hours.  Recent Lipid Panel No results found for: CHOL, TRIG, HDL, CHOLHDL, VLDL, LDLCALC, LDLDIRECT   TTE 12/20/18: 1. Left ventricular ejection fraction, by visual estimation, is 60 to  65%. The left ventricle has normal function. There is no left ventricular  hypertrophy. Normal diastolic function.  2. Global right ventricle has normal systolic function.The right  ventricular size is normal.  No increase in right ventricular wall  thickness.  3. Left atrial size was normal.  4. Right atrial size was normal.  5. Moderate thickening of the mitral valve leaflet(s).  6. The mitral valve is normal in structure. Trace mitral valve  regurgitation.  7. The tricuspid valve is normal in structure. Tricuspid valve  regurgitation is trivial.  8. The aortic valve was not well visualized. Aortic valve regurgitation  is not visualized. No evidence of aortic valve sclerosis or stenosis.  9. The pulmonic valve was not well visualized. Pulmonic valve  regurgitation is not visualized.  10. Mildly elevated pulmonary artery systolic pressure.  11. The inferior vena cava is normal in size with greater than 50%  respiratory variability, suggesting right atrial pressure of 3 mmHg.  12. The tricuspid regurgitant velocity is 2.31 m/s, and with an assumed  right atrial pressure of 10 mmHg, the estimated right ventricular systolic  pressure is mildly elevated at 31.3 mmHg.   CTA 01/14/19: 1. Coronary calcium score of 32. This was 94 percentile for age and sex matched control. 2.  Normal coronary origin with right dominance. 3. Nonobstructive CAD, with calcified plaque in the proximal LAD causing minimal (0-24%) stenosis and noncalcified plaque in the proximal RCA causing minimal (0-24%) stenosis  CAD-RADS 1. Minimal non-obstructive CAD (0-24%).  Consider non-atherosclerotic causes of chest pain. Consider preventive therapy and risk factor modification.  Noncardiac: IMPRESSION: 1.  Aortic Atherosclerosis (ICD10-I70.0).  Cardiac monitor 02/16/19:  No significant arrhythmias  Patient triggered events corresponded to sinus rhythm +/- PACs   Predominant rhythm is sinus rhythm. Range is 58-142 bpm with average of 78 bpm. No atrial fibrillation, sustained ventricular tachycardia, significant pause, or high degree AV block. Total ectopy <1%. 20 patient triggered events, corresponding to sinus rhythm  +/- PACs  No significant abnormalities.  Physical Exam:    VS:  BP 130/76   Pulse 80   Ht 5\' 7"  (1.702 m)   Wt 153 lb 12.8 oz (69.8 kg)   SpO2 99%   BMI 24.09 kg/m     Wt Readings from Last 3 Encounters:  09/18/19 153 lb 12.8 oz (69.8 kg)  03/22/19 146 lb 9.6 oz (66.5 kg)  03/10/19 146 lb (66.2 kg)     GEN:  Well nourished, well developed in no acute distress HEENT: Normal NECK: No JVD; No carotid bruits LYMPHATICS: No lymphadenopathy CARDIAC: RRR, no murmurs, rubs, gallops RESPIRATORY:  Expiratory wheezing ABDOMEN: Soft, non-tender, non-distended MUSCULOSKELETAL:  No edema; No deformity  SKIN: Warm and dry NEUROLOGIC:  Alert and oriented x 3 PSYCHIATRIC:  Normal affect   ASSESSMENT:    1. Coronary artery disease involving native coronary artery of native heart without angina pectoris   2. Hyperlipidemia, unspecified hyperlipidemia type   3. Chest pain of uncertain etiology   4. Palpitations    PLAN:     Coronary artery disease: Coronary CTA on 01/14/2019  showed nonobstructive CAD with calcified plaque in the proximal LAD causing minimal (0-24%) stenosis and noncalcified plaque in the proximal RCA causing minimal (0-24%) stenosis.  Calcium score was 32 (73rd percentile for age/gender) -Rosuvastatin increased to 40 mg daily, developed myalgias, so was decreased to 20 mg daily and Zetia 10 mg daily was added.  She  stopped taking Zetia as he thought it was causing myalgias, though unclear cause of her myalgias as she started Zetia right after she received her COVID-19 vaccine.  Recommend trial of Zetia 10 mg daily again  Chest pain: Atypical in description,  as describes substernal chest pressure but not related to exertion.  Coronary CTA showed nonobstructive CAD.  Reports chest pain has resolved.  Palpitations: Cardiac monitor showed symptoms corresponding to PACs.  Reports no recent palpitations.  No indication for treatment at this time  Hyperlipidemia: LDL 132 on 03/08/2019.  Rosuvastatin increased to 40 mg daily but developed myalgias so was decreased back to 20 mg daily and Zetia 10 mg daily was added. She stopped taking Zetia as he thought it was causing myalgias, though unclear cause of her myalgias and she started Zetia right after she received her COVID-19 vaccine.  LDL 124 on 09/04/2019.  Recommend trial of Zetia 10 mg daily again.  Recheck lipid panel in 3 months   RTC in 6 months   Medication Adjustments/Labs and Tests Ordered: Current medicines are reviewed at length with the patient today.  Concerns regarding medicines are outlined above.  Orders Placed This Encounter  Procedures  . Lipid panel  . EKG 12-Lead   Meds ordered this encounter  Medications  . ezetimibe (ZETIA) 10 MG tablet    Sig: Take 1 tablet (10 mg total) by mouth daily.    Dispense:  90 tablet    Refill:  3    Patient Instructions  Medication Instructions:  RESTART Zetia 10 mg daily  *If you need a refill on your cardiac medications before your next appointment, please call your pharmacy*   Lab Work: FASTING LAB in 3 MONTHS (November)-I will mail you the lab orders closer to date as a reminder  If you have labs (blood work) drawn today and your tests are completely normal, you will receive your results only by: Marland Kitchen MyChart Message (if you have MyChart) OR . A paper copy in the mail If you have any lab test that  is abnormal or we need to change your treatment, we will call you to review the results.  Follow-Up: At [facility], you and your health needs are our priority.  As part of our continuing mission to provide you with exceptional heart care, we have created designated Provider Care Teams.  These Care Teams include your primary Cardiologist (physician) and Advanced Practice Providers (APPs -  Physician Assistants and Nurse Practitioners) who all work together to provide you with the care you need, when you need it.  We recommend signing up for the patient portal called "MyChart".  Sign up information is provided on this After Visit Summary.  MyChart is used to connect with patients for Virtual Visits (Telemedicine).  Patients are able to view lab/test results, encounter notes, upcoming appointments, etc.  Non-urgent messages can be sent to your provider as well.   To learn more about what you can do with MyChart, go to [email or web address].    Your next appointment:   6 month(s)  The format for your next appointment:   In Person  Provider:   Oswaldo Milian, MD         Signed, Donato Heinz, MD  09/18/2019 2:57 PM    [place]

## 2019-09-18 ENCOUNTER — Encounter: Payer: Self-pay | Admitting: Cardiology

## 2019-09-18 ENCOUNTER — Ambulatory Visit: Payer: BC Managed Care – PPO | Admitting: Cardiology

## 2019-09-18 ENCOUNTER — Other Ambulatory Visit: Payer: Self-pay

## 2019-09-18 VITALS — BP 130/76 | HR 80 | Ht 67.0 in | Wt 153.8 lb

## 2019-09-18 DIAGNOSIS — E785 Hyperlipidemia, unspecified: Secondary | ICD-10-CM | POA: Diagnosis not present

## 2019-09-18 DIAGNOSIS — I2510 Atherosclerotic heart disease of native coronary artery without angina pectoris: Secondary | ICD-10-CM | POA: Diagnosis not present

## 2019-09-18 DIAGNOSIS — R002 Palpitations: Secondary | ICD-10-CM

## 2019-09-18 DIAGNOSIS — R079 Chest pain, unspecified: Secondary | ICD-10-CM | POA: Diagnosis not present

## 2019-09-18 MED ORDER — EZETIMIBE 10 MG PO TABS: 10.0000 mg | ORAL_TABLET | Freq: Every day | ORAL | 3 refills | Status: DC

## 2019-09-18 NOTE — Patient Instructions (Signed)
Medication Instructions:  RESTART Zetia 10 mg daily  *If you need a refill on your cardiac medications before your next appointment, please call your pharmacy*   Lab Work: FASTING LAB in 3 MONTHS (November)-I will mail you the lab orders closer to date as a reminder  If you have labs (blood work) drawn today and your tests are completely normal, you will receive your results only by: Marland Kitchen MyChart Message (if you have MyChart) OR . A paper copy in the mail If you have any lab test that is abnormal or we need to change your treatment, we will call you to review the results.  Follow-Up: At [facility], you and your health needs are our priority.  As part of our continuing mission to provide you with exceptional heart care, we have created designated Provider Care Teams.  These Care Teams include your primary Cardiologist (physician) and Advanced Practice Providers (APPs -  Physician Assistants and Nurse Practitioners) who all work together to provide you with the care you need, when you need it.  We recommend signing up for the patient portal called "MyChart".  Sign up information is provided on this After Visit Summary.  MyChart is used to connect with patients for Virtual Visits (Telemedicine).  Patients are able to view lab/test results, encounter notes, upcoming appointments, etc.  Non-urgent messages can be sent to your provider as well.   To learn more about what you can do with MyChart, go to [email or web address].    Your next appointment:   6 month(s)  The format for your next appointment:   In Person  Provider:   Oswaldo Milian, MD

## 2019-09-20 ENCOUNTER — Encounter: Payer: Self-pay | Admitting: Rheumatology

## 2019-09-20 ENCOUNTER — Other Ambulatory Visit: Payer: Self-pay

## 2019-09-20 ENCOUNTER — Ambulatory Visit: Payer: BC Managed Care – PPO | Admitting: Rheumatology

## 2019-09-20 VITALS — BP 103/65 | HR 78 | Resp 13 | Ht 67.25 in | Wt 151.6 lb

## 2019-09-20 DIAGNOSIS — M7062 Trochanteric bursitis, left hip: Secondary | ICD-10-CM

## 2019-09-20 DIAGNOSIS — M5136 Other intervertebral disc degeneration, lumbar region: Secondary | ICD-10-CM

## 2019-09-20 DIAGNOSIS — Z8639 Personal history of other endocrine, nutritional and metabolic disease: Secondary | ICD-10-CM

## 2019-09-20 DIAGNOSIS — I7300 Raynaud's syndrome without gangrene: Secondary | ICD-10-CM | POA: Diagnosis not present

## 2019-09-20 DIAGNOSIS — M722 Plantar fascial fibromatosis: Secondary | ICD-10-CM

## 2019-09-20 DIAGNOSIS — M79641 Pain in right hand: Secondary | ICD-10-CM

## 2019-09-20 DIAGNOSIS — M8589 Other specified disorders of bone density and structure, multiple sites: Secondary | ICD-10-CM

## 2019-09-20 DIAGNOSIS — M7061 Trochanteric bursitis, right hip: Secondary | ICD-10-CM

## 2019-09-20 DIAGNOSIS — M797 Fibromyalgia: Secondary | ICD-10-CM

## 2019-09-20 DIAGNOSIS — M19042 Primary osteoarthritis, left hand: Secondary | ICD-10-CM

## 2019-09-20 DIAGNOSIS — Z853 Personal history of malignant neoplasm of breast: Secondary | ICD-10-CM

## 2019-09-20 DIAGNOSIS — F5101 Primary insomnia: Secondary | ICD-10-CM | POA: Diagnosis not present

## 2019-09-20 DIAGNOSIS — R5383 Other fatigue: Secondary | ICD-10-CM

## 2019-09-20 DIAGNOSIS — M19041 Primary osteoarthritis, right hand: Secondary | ICD-10-CM

## 2019-09-20 MED ORDER — METHOCARBAMOL 500 MG PO TABS: ORAL_TABLET | ORAL | 0 refills | Status: DC

## 2019-09-22 LAB — ANTI-NUCLEAR AB-TITER (ANA TITER): ANA Titer 1: 1:40 {titer} — ABNORMAL HIGH

## 2019-09-22 LAB — SEDIMENTATION RATE: Sed Rate: 2 mm/h (ref 0–30)

## 2019-09-22 LAB — RHEUMATOID FACTOR: Rheumatoid fact SerPl-aCnc: <14 IU/mL (ref <14)

## 2019-09-22 LAB — ANA: Anti Nuclear Antibody (ANA): POSITIVE — AB

## 2019-09-22 LAB — CYCLIC CITRUL PEPTIDE ANTIBODY, IGG: Cyclic Citrullin Peptide Ab: <16 UNITS

## 2019-09-22 LAB — URIC ACID: Uric Acid, Serum: 4.2 mg/dL (ref 2.5–7.0)

## 2019-09-25 ENCOUNTER — Other Ambulatory Visit: Payer: Self-pay | Admitting: Internal Medicine

## 2019-10-03 ENCOUNTER — Ambulatory Visit: Payer: BC Managed Care – PPO | Admitting: Internal Medicine

## 2019-10-03 ENCOUNTER — Other Ambulatory Visit: Payer: Self-pay

## 2019-10-03 ENCOUNTER — Encounter: Payer: Self-pay | Admitting: Internal Medicine

## 2019-10-03 DIAGNOSIS — J45991 Cough variant asthma: Secondary | ICD-10-CM

## 2019-10-03 NOTE — Patient Instructions (Signed)
No change in medications   Please schedule a follow up visit in 12  months but call sooner if needed  

## 2019-10-03 NOTE — Progress Notes (Signed)
Michelle Gray, female    DOB: 05/21/1955,     MRN: [id number]   Brief patient profile:  64yowf never smoker/NP for [facility]  with onset asthma around 1990 eval here around 2005 on prn saba rarely needed but esp in extremes of heat /cold but  not spring / fall and no assoc cough or need for maint  then around 2016 developed chest tightness and wheezing more of a chronic pattern improved p rx symb 80 2bid improved and rare saba then early 2020 noted more doe so around late Oct 2020 changed symb 160 and some better but still uncomfortable with deep breath so self referred back to pulmonary 12/22/2018 .     History of Present Illness  12/22/2018  Pulmonary/ 1st office eval/Michelle Gray  Chief Complaint  Patient presents with  . Pulmonary Consult    Self referral- asthma- trouble taking a deep breath and wheezing. She is using her albuterol inhaler daily.   Dyspnea:  Ex bike not using and limited by fibromyalgia/ plantar fasciitis/ ok with yard work and steps / some chest discomfort midline with deep breath but not with ex Cough: not a lot but finds herself throat clearing x 30 years  " just her like her dad" no mucus production and only happens while awake  Sleep: no resp problem SABA use: p symb 160 x 2 6 am  Typically feels needs saba w/in a few hours  No nasal symptoms  rec Plan A = Automatic = Always=    symbicort 160 Take 2 puffs first thing in am and then another 2 puffs about 12 hours later.  Work on inhaler technique: Plan B = Backup (to supplement plan A, not to replace it) Only use your albuterol inhaler as a rescue medication  Pantoprazole (protonix) 40 mg   Take  30-60 min before first meal of the day and Pepcid (famotidine)  20 mg one @  After supper   GERD diet  televisit  01/19/19 rec: Add gabapentin 300 mg every morning to see if helps the daytime   10/03/2019  f/u ov/Michelle Gray re:  Chief Complaint  Patient presents with  . Follow-up    Denies any problems  Dyspnea:   Limited some by feet but not by breathing Cough: none Sleeping: fine flat SABA use: none 02: none    No obvious day to day or daytime variability or assoc excess/ purulent sputum or mucus plugs or hemoptysis or cp or chest tightness, subjective wheeze or overt sinus or hb symptoms.   Sleeping  without nocturnal  or early am exacerbation  of respiratory  c/o's or need for noct saba. Also denies any obvious fluctuation of symptoms with weather or environmental changes or other aggravating or alleviating factors except as outlined above   No unusual exposure hx or h/o childhood pna/ asthma or knowledge of premature birth.  Current Allergies, Complete Past Medical History, Past Surgical History, Family History, and Social History were reviewed in [employer] record.  ROS  The following are not active complaints unless bolded Hoarseness, sore throat, dysphagia, dental problems, itching, sneezing,  nasal congestion or discharge of excess mucus or purulent secretions, ear ache,   fever, chills, sweats, unintended wt loss or wt gain, classically pleuritic or exertional cp,  orthopnea pnd or arm/hand swelling  or leg swelling, presyncope, palpitations, abdominal pain, anorexia, nausea, vomiting, diarrhea  or change in bowel habits or change in bladder habits, change in stools or change in urine,  dysuria, hematuria,  rash, arthralgias, visual complaints, headache, numbness, weakness or ataxia or problems with walking or coordination,  change in mood or  memory.        Current Meds  Medication Sig  . 5-HTP CAPS Take 2 tablets by mouth at bedtime.  Marland Kitchen albuterol (PROVENTIL HFA;VENTOLIN HFA) 108 (90 BASE) MCG/ACT inhaler Inhale 2 puffs into the lungs every 6 (six) hours as needed for wheezing or shortness of breath.  . Biotin 5000 MCG CAPS Take 5,000 mcg by mouth daily.  . Cholecalciferol (VITAMIN D3) 5000 units CAPS Take 10,000 Units by mouth once a week.  . Coenzyme Q10 (COQ-10)  100 MG CAPS   . desonide (DESOWEN) 0.05 % ointment desonide 0.05 % topical ointment  APPLY TOPICALLY TO THE SKIN TWICE DAILY  . ezetimibe (ZETIA) 10 MG tablet Take 1 tablet (10 mg total) by mouth daily.  Marland Kitchen gabapentin (NEURONTIN) 300 MG capsule TAKE TWO CAPSULES BY MOUTH DAILY AT BEDTIME  . Glucosamine 500 MG TABS Take 1,500 mg by mouth daily.    Marland Kitchen levothyroxine (SYNTHROID, LEVOTHROID) 50 MCG tablet Take 50 mcg by mouth every other day.    . levothyroxine (SYNTHROID, LEVOTHROID) 75 MCG tablet Take 75 mcg by mouth every other day.    . Magnesium 500 MG CAPS Take 500 mg by mouth daily.    . meloxicam (MOBIC) 15 MG tablet Take 1 tablet (15 mg total) by mouth daily.  . methocarbamol (ROBAXIN) 500 MG tablet 1 tablet p.o. nightly as needed  . Misc Natural Products (TART CHERRY ADVANCED PO) Take by mouth.  . rosuvastatin (CRESTOR) 20 MG tablet Take 1 tablet (20 mg total) by mouth daily.  . SYMBICORT 160-4.5 MCG/ACT inhaler INHALE 2 PUFFS BY MOUTH IN THE MORNING AND THEN ANOTHER 2 PUFFS ABOUT 12 HOURS LATER  . TURMERIC PO Take 1 tablet by mouth daily.  Marland Kitchen zolpidem (AMBIEN) 10 MG tablet Take 1 tablet (10 mg total) by mouth at bedtime.               Past Medical History:  Diagnosis Date  . Anemia   . Arthritis   . Asthma   . Cancer ([place])   . Hyperlipidemia   . Neuromuscular disorder ([place])   . Osteoporosis   . Thyroid disease         Objective:       Wt Readings from Last 3 Encounters:  10/03/19 150 lb 6.4 oz (68.2 kg)  09/20/19 151 lb 9.6 oz (68.8 kg)  09/18/19 153 lb 12.8 oz (69.8 kg)     Vital signs reviewed - Note on arrival 10/03/2019  02 sats  99% on RA       amb wm nad   HEENT : pt wearing mask not removed for exam due to covid -19 concerns.    NECK :  without JVD/Nodes/TM/ nl carotid upstrokes bilaterally   LUNGS: no acc muscle use,  Nl contour chest which is clear to A and P bilaterally without cough on insp or exp maneuvers   CV:  RRR  no s3 or murmur or  increase in P2, and no edema   ABD:  soft and nontender with nl inspiratory excursion in the supine position. No bruits or organomegaly appreciated, bowel sounds nl  MS:  Nl gait/ ext warm without deformities, calf tenderness, cyanosis or clubbing No obvious joint restrictions   SKIN: warm and dry without lesions    NEURO:  alert, approp, nl sensorium with  no motor or  cerebellar deficits apparent.               Assessment

## 2019-10-03 NOTE — Assessment & Plan Note (Addendum)
?  Onset 1990s - initial pulmonary eval 2005 [facility] (paper chart requested) - worse since 2018 rx symb 80 2bid and increased to 160 2bid  Oct 2020 - spacer added 12/22/2018 as has component of uacs > improved 01/19/2019 but not resolved - 01/19/2019  rec titrate gabapentin to max of 300 qid to see if helps> never needed  All goals of chronic asthma control met including optimal function and elimination of symptoms with minimal need for rescue therapy.  Contingencies discussed in full including contacting this office immediately if not controlling the symptoms using the rule of two's.     Advised would get covid booster when available   F/u can be yearly - call sooner prn           Each maintenance medication was reviewed in detail including emphasizing most importantly the difference between maintenance and prns and under what circumstances the prns are to be triggered using an action plan format where appropriate.  Total time for H and P, chart review, counseling, reviewing device and generating customized AVS unique to this office visit / charting = 20 min

## 2019-10-27 ENCOUNTER — Telehealth: Payer: Self-pay | Admitting: Internal Medicine

## 2019-10-27 MED ORDER — BUDESONIDE-FORMOTEROL FUMARATE 160-4.5 MCG/ACT IN AERO: 2.0000 | INHALATION_SPRAY | Freq: Two times a day (BID) | RESPIRATORY_TRACT | 6 refills | Status: DC

## 2019-10-27 NOTE — Telephone Encounter (Signed)
Rx for generic version of Symbicort  Has been sent to preferred pharmacy for pt. Called and spoke with pt letting her know this had been done and she verbalized understanding. Nothing further needed.

## 2019-10-30 ENCOUNTER — Other Ambulatory Visit: Payer: Self-pay | Admitting: Rheumatology

## 2019-10-30 MED ORDER — ZOLPIDEM TARTRATE 10 MG PO TABS
10.0000 mg | ORAL_TABLET | Freq: Every day | ORAL | 0 refills | Status: DC
Start: 2019-10-30 — End: 2020-01-16

## 2019-10-30 MED ORDER — MELOXICAM 15 MG PO TABS
15.0000 mg | ORAL_TABLET | Freq: Every day | ORAL | 0 refills | Status: DC
Start: 2019-10-30 — End: 2020-01-16

## 2019-10-30 MED ORDER — GABAPENTIN 300 MG PO CAPS
ORAL_CAPSULE | ORAL | 0 refills | Status: DC
Start: 2019-10-30 — End: 2020-01-16

## 2019-10-30 NOTE — Telephone Encounter (Signed)
Last Visit: 09/20/2019 °Next Visit: 03/20/2020 °Labs: 09/04/2019 CBC/CMP WNL °  °Okay to refill Ambien, Gabapentin and Mobic?  °

## 2019-10-30 NOTE — Telephone Encounter (Signed)
Please advise patient not to take any Xanax while she is on Ambien or gabapentin.

## 2019-10-30 NOTE — Telephone Encounter (Signed)
Patient request refill on #1. Ambien 10 mg, #2. Gabapentin 2/day, #3. Mobic 15 mg sent to [facility] on [address]. Patient requests 90 day supply on all.

## 2019-10-30 NOTE — Telephone Encounter (Signed)
Patient advised Xanax while she is on Ambien or gabapentin. Patient states she has not taken the Xanax. Patient states she will try to cute the Ambien in half and try 5 mg as recommended per Dr. Estanislado Pandy.

## 2019-12-01 ENCOUNTER — Encounter: Payer: Self-pay | Admitting: Rheumatology

## 2019-12-01 ENCOUNTER — Other Ambulatory Visit: Payer: Self-pay | Admitting: Internal Medicine

## 2019-12-05 ENCOUNTER — Telehealth: Payer: Self-pay | Admitting: *Deleted

## 2019-12-05 NOTE — Telephone Encounter (Signed)
Patient states Dr. Benjie Karvonen has discussed the results with her.

## 2019-12-05 NOTE — Telephone Encounter (Signed)
Received DEXA results from [facility].  Date of Scan: 12/01/2019 Lowest T-score and site measured: -2.0 Left Femoral Neck Significant changes in BMD and site measured (5% and above): -10% Right Total Femur, -9% AP total Spine   Current Regimen: Taking Vitamin D  Recommendation: Needs appointment to discuss.  Attempted to contact the patient and left message for patient to call the office.

## 2019-12-29 ENCOUNTER — Telehealth: Payer: Self-pay | Admitting: Internal Medicine

## 2019-12-29 NOTE — Telephone Encounter (Signed)
By our records this RX was sent 12/01/19 with brand name. I asked patient if she has called her pharmacy to see if they had it she said no. She will call and let us know if they do not have the RX for 11 refills and we can refill it for her.   We will await her call

## 2020-01-16 ENCOUNTER — Telehealth: Payer: Self-pay

## 2020-01-16 MED ORDER — ZOLPIDEM TARTRATE 10 MG PO TABS
10.0000 mg | ORAL_TABLET | Freq: Every day | ORAL | 0 refills | Status: DC
Start: 2020-01-16 — End: 2020-04-15

## 2020-01-16 MED ORDER — GABAPENTIN 300 MG PO CAPS
ORAL_CAPSULE | ORAL | 0 refills | Status: DC
Start: 2020-01-16 — End: 2020-04-15

## 2020-01-16 MED ORDER — MELOXICAM 15 MG PO TABS
15.0000 mg | ORAL_TABLET | Freq: Every day | ORAL | 0 refills | Status: DC
Start: 2020-01-16 — End: 2020-04-15

## 2020-01-16 NOTE — Telephone Encounter (Signed)
Last Visit: 09/20/2019 Next Visit: 03/20/2020 Labs: 09/04/2019 CBC/CMP WNL  Okay to refill Ambien, Gabapentin and Mobic?

## 2020-01-16 NOTE — Telephone Encounter (Signed)
Patient called requesting prescription refills of the following 3 medications. Zolpidem 10 mg Neurontin 300 mg  Mobic 15 mg Patient is requesting a 3 month supply to be sent to [facility] at [facility].

## 2020-03-06 NOTE — Progress Notes (Signed)
Office Visit Note  Patient: Michelle Gray             Date of Birth: 09/11/1955           MRN: [id number]             PCP: Cari Caraway, MD Referring: Cari Caraway, MD Visit Date: 03/20/2020 Occupation: @GUAROCC @  Subjective:  Medication monitoring.   History of Present Illness: Michelle Gray is a 65 y.o. female with history of fibromyalgia and osteoarthritis.  She states she continues to have some discomfort in her lower back.  She also has discomfort in the left trochanteric bursa off and on.  She continues to have plantar fasciitis.  She has been doing the stretching which has been helpful.  She states she was started on Zetia due to hyperlipidemia.  Her cholesterol is normal now.  She noticed that her ALT is mildly elevated.  She has reduce meloxicam to 7.5 mg p.o. daily.  She states she also tried reducing gabapentin but it caused increased pain from herpetic neuralgia and she had to increase the dose of gabapentin.  Activities of Daily Living:  Patient reports morning stiffness for 5 minutes.   Patient denies nocturnal pain.  Difficulty dressing/grooming: Denies Difficulty climbing stairs: Denies Difficulty getting out of chair: Denies Difficulty using hands for taps, buttons, cutlery, and/or writing: Reports  Review of Systems  Constitutional: Negative for fatigue, night sweats, weight gain and weight loss.  HENT: Negative for mouth sores, trouble swallowing, trouble swallowing, mouth dryness and nose dryness.   Eyes: Negative for pain, redness, visual disturbance and dryness.  Respiratory: Negative for cough, shortness of breath and difficulty breathing.   Cardiovascular: Negative for chest pain, palpitations, hypertension, irregular heartbeat and swelling in legs/feet.  Gastrointestinal: Negative for blood in stool, constipation and diarrhea.  Endocrine: Negative for increased urination.  Genitourinary: Negative for vaginal dryness.  Musculoskeletal: Positive for  arthralgias, joint pain and morning stiffness. Negative for joint swelling, myalgias, muscle weakness, muscle tenderness and myalgias.  Skin: Positive for color change. Negative for rash, hair loss, skin tightness, ulcers and sensitivity to sunlight.  Allergic/Immunologic: Negative for susceptible to infections.  Neurological: Negative for dizziness, memory loss, night sweats and weakness.  Hematological: Negative for swollen glands.  Psychiatric/Behavioral: Negative for depressed mood and sleep disturbance. The patient is not nervous/anxious.     PMFS History:  Patient Active Problem List   Diagnosis Date Noted  . Cough variant asthma with ? component upper airway cough syndrome 12/23/2018  . Primary osteoarthritis of both hands 02/21/2016  . Fibromyalgia 02/21/2016  . History of hypothyroidism 02/21/2016  . Dyslipidemia 02/21/2016  . History of breast cancer 02/21/2016  . Age-related osteoporosis without current pathological fracture 02/21/2016  . Unspecified hypothyroidism 08/06/2013  . Other and unspecified hyperlipidemia 08/06/2013  . Insomnia 08/06/2013  . Intrinsic asthma 08/06/2013  . Hip pain, left 01/12/2011  . Plantar fasciitis 01/12/2011    Past Medical History:  Diagnosis Date  . Anemia   . Arthritis   . Asthma   . Cancer ([place])   . Hyperlipidemia   . Neuromuscular disorder ([place])   . Osteoporosis   . Thyroid disease     Family History  Problem Relation Age of Onset  . Cancer Mother   . Hyperlipidemia Mother   . Hypertension Mother   . Stroke Mother   . Dementia Mother   . Hyperlipidemia Father   . Stroke Father   . Prostate cancer Father   .  Cancer Father 21       Prostate  . Hypertension Brother    Past Surgical History:  Procedure Laterality Date  . APPENDECTOMY    . BREAST SURGERY    . FOOT SURGERY    . LAPROSCOPIC    . WISDOM TEETH REMOVAL     Social History   Social History Narrative  . Not on file   Immunization History  Administered  Date(s) Administered  . Influenza-Unspecified 10/04/2018  . PFIZER(Purple Top)SARS-COV-2 Vaccination 01/31/2019, 02/21/2019     Objective: Vital Signs: BP 113/76 (BP Location: Left Arm, Patient Position: Sitting, Cuff Size: Normal)   Pulse 68   Ht 5' 7.25" (1.708 m)   Wt 153 lb (69.4 kg)   BMI 23.79 kg/m    Physical Exam Vitals and nursing note reviewed.  Constitutional:      Appearance: She is well-developed and well-nourished.  HENT:     Head: Normocephalic and atraumatic.  Eyes:     Extraocular Movements: EOM normal.     Conjunctiva/sclera: Conjunctivae normal.  Cardiovascular:     Rate and Rhythm: Normal rate and regular rhythm.     Pulses: Intact distal pulses.     Heart sounds: Normal heart sounds.  Pulmonary:     Effort: Pulmonary effort is normal.     Breath sounds: Normal breath sounds.  Abdominal:     General: Bowel sounds are normal.     Palpations: Abdomen is soft.  Musculoskeletal:     Cervical back: Normal range of motion.  Lymphadenopathy:     Cervical: No cervical adenopathy.  Skin:    General: Skin is warm and dry.     Capillary Refill: Capillary refill takes less than 2 seconds.  Neurological:     Mental Status: She is alert and oriented to person, place, and time.  Psychiatric:        Mood and Affect: Mood and affect normal.        Behavior: Behavior normal.      Musculoskeletal Exam: C-spine was in good range of motion.  She has some discomfort range of motion of her lumbar spine.  Shoulder joints, elbow joints, wrist joints, MCPs PIPs and DIPs with good range of motion with no synovitis.  Hip joints knee joints, ankles with good range of motion.  She had no tenderness over MTPs.  She has some tenderness over left trochanteric bursa.  CDAI Exam: CDAI Score: -- Patient Global: --; Provider Global: -- Swollen: --; Tender: -- Joint Exam 03/20/2020   No joint exam has been documented for this visit   There is currently no information documented  on the homunculus. Go to the Rheumatology activity and complete the homunculus joint exam.  Investigation: No additional findings.  Imaging: No results found.  Recent Labs: Lab Results  Component Value Date   WBC 16.8 (H) 08/06/2013   HGB 13.4 08/06/2013   PLT 391 08/06/2013  Showed CBC with differential normal March 11, 2020 labs from Dr. Trellis Paganini office WBC 4.9, hemoglobin 12.8, platelet 284, CMP with GFR normal except ALT 33 normal 0-32, TSH normal, LDL 57 Speciality Comments: No specialty comments available.  Procedures:  No procedures performed Allergies: Compazine   Assessment / Plan:     Visit Diagnoses: Fibromyalgia -she has generalized pain and some discomfort.  Although her symptoms are quite well managed on combination therapy.  She has been taking meloxicam 15 mg tablet, half tablet p.o. daily which she recently reduced.  She is on, gabapentin 300 mg  capsule 2 capsules at bedtime.  She tried to decrease the dose with developed nocturnal pain due to herpetic neuralgia.  She is on ambien 10 mg p.o. nightly for insomnia.  She continues to take robaxin 500 mg p.o. nightly for muscle spasms.  Primary insomnia - Ambien 10 mg p.o. nightly.  She does unable to sleep on a lower dose of Ambien.  She will try to taper it gradually.  Other fatigue-related to fibromyalgia and insomnia.  Raynaud's disease without gangrene-mildly active only when exposed to the colder temperatures.  Trochanteric bursitis of both hips-her right trochanteric bursitis has resolved.  She has some tenderness over left trochanteric bursa.  She has been doing stretching exercises.  Primary osteoarthritis of both hands-she had no discomfort on palpation.  Although she has discomfort when she is writing for prolonged time.  Plantar fasciitis-she has been having off-and-on discomfort.  She plans to get some shoes with orthotics.  DDD (degenerative disc disease), lumbar - She has multilevel spondylosis and  facet joint arthropathy.  She suffers from chronic pain.  Some of the lower back exercises were demonstrated in the office today.  Osteopenia of multiple sites - DEXA 12/01/19 T-score: -2.0, BMD: 0.622. She had GI intolerance to bisphosphonates in the past.  She will have repeat DEXA in 2023 .  History of hypothyroidism  History of hyperlipidemia-her LDL is much improved on Zetia.  History of breast cancer  Orders: No orders of the defined types were placed in this encounter.  No orders of the defined types were placed in this encounter.    Follow-Up Instructions: Return in about 6 months (around 09/17/2020) for FMS,OA.   Bo Merino, MD  Note - This record has been created using Editor, commissioning.  Chart creation errors have been sought, but may not always  have been located. Such creation errors do not reflect on  the standard of medical care.

## 2020-03-20 ENCOUNTER — Ambulatory Visit: Payer: BC Managed Care – PPO | Admitting: Rheumatology

## 2020-03-20 ENCOUNTER — Other Ambulatory Visit: Payer: Self-pay

## 2020-03-20 ENCOUNTER — Encounter: Payer: Self-pay | Admitting: Rheumatology

## 2020-03-20 VITALS — BP 113/76 | HR 68 | Ht 67.25 in | Wt 153.0 lb

## 2020-03-20 DIAGNOSIS — M797 Fibromyalgia: Secondary | ICD-10-CM | POA: Diagnosis not present

## 2020-03-20 DIAGNOSIS — F5101 Primary insomnia: Secondary | ICD-10-CM

## 2020-03-20 DIAGNOSIS — Z8639 Personal history of other endocrine, nutritional and metabolic disease: Secondary | ICD-10-CM

## 2020-03-20 DIAGNOSIS — M19041 Primary osteoarthritis, right hand: Secondary | ICD-10-CM

## 2020-03-20 DIAGNOSIS — I7300 Raynaud's syndrome without gangrene: Secondary | ICD-10-CM | POA: Diagnosis not present

## 2020-03-20 DIAGNOSIS — R5383 Other fatigue: Secondary | ICD-10-CM

## 2020-03-20 DIAGNOSIS — M5136 Other intervertebral disc degeneration, lumbar region: Secondary | ICD-10-CM

## 2020-03-20 DIAGNOSIS — M8589 Other specified disorders of bone density and structure, multiple sites: Secondary | ICD-10-CM

## 2020-03-20 DIAGNOSIS — M7061 Trochanteric bursitis, right hip: Secondary | ICD-10-CM

## 2020-03-20 DIAGNOSIS — M7062 Trochanteric bursitis, left hip: Secondary | ICD-10-CM

## 2020-03-20 DIAGNOSIS — M19042 Primary osteoarthritis, left hand: Secondary | ICD-10-CM

## 2020-03-20 DIAGNOSIS — Z853 Personal history of malignant neoplasm of breast: Secondary | ICD-10-CM

## 2020-03-20 DIAGNOSIS — M722 Plantar fascial fibromatosis: Secondary | ICD-10-CM

## 2020-03-21 ENCOUNTER — Ambulatory Visit (INDEPENDENT_AMBULATORY_CARE_PROVIDER_SITE_OTHER): Payer: BC Managed Care – PPO | Admitting: Cardiology

## 2020-03-21 ENCOUNTER — Encounter: Payer: Self-pay | Admitting: Cardiology

## 2020-03-21 VITALS — BP 90/60 | HR 74 | Ht 67.0 in | Wt 151.8 lb

## 2020-03-21 DIAGNOSIS — R002 Palpitations: Secondary | ICD-10-CM | POA: Diagnosis not present

## 2020-03-21 DIAGNOSIS — R079 Chest pain, unspecified: Secondary | ICD-10-CM

## 2020-03-21 DIAGNOSIS — I2510 Atherosclerotic heart disease of native coronary artery without angina pectoris: Secondary | ICD-10-CM

## 2020-03-21 DIAGNOSIS — E785 Hyperlipidemia, unspecified: Secondary | ICD-10-CM | POA: Diagnosis not present

## 2020-03-21 MED ORDER — ROSUVASTATIN CALCIUM 20 MG PO TABS
20.0000 mg | ORAL_TABLET | Freq: Every day | ORAL | 3 refills | Status: DC
Start: 2020-03-21 — End: 2021-04-09

## 2020-03-21 MED ORDER — EZETIMIBE 10 MG PO TABS: 10.0000 mg | ORAL_TABLET | Freq: Every day | ORAL | 3 refills | Status: DC

## 2020-03-21 NOTE — Progress Notes (Signed)
Cardiology Office Note:    Date:  03/21/2020   ID:  Michelle Gray, DOB 1955-12-06, MRN [id number]  PCP:  Cari Caraway, MD  Cardiologist:  Donato Heinz, MD  Electrophysiologist:  None   Referring MD: Cari Caraway, MD   Chief Complaint  Patient presents with  . Palpitations    History of Present Illness:    Michelle Gray is a 65 y.o. female with a hx of fibromyalgia, asthma, hyperlipidemia, breast cancer, hypothyroidism who returns for follow-up.  She was initially seen on 12/08/2018, she was referred by Dr. Addison Lank for evaluation of palpitations and chest pain.  She states that during episodes of palpitations she does not feel that her heart is racing but feels that her heart beat is irregular.  No lightheadedness or syncope during episodes.  She also reports that she had been having chest pressure.  Describes as pressure in center of her chest.  Has not noted relationship with exertion.  She has no smoking history.  Mother had atrial fibrillation.  No other family history of heart disease.  TSH was 0.93 on 10/23.  TTE was done on 12/20/2018, which showed normal LV systolic function, normal RV function, no significant valvular disease.  Cardiac monitor showed no significant arrhythmias, with the patient triggered events corresponding to sinus rhythm plus or minus PACs.  Coronary CTA was done on 01/14/2019, which showed nonobstructive CAD with calcified plaque in the proximal LAD causing minimal (0-24%) stenosis and noncalcified plaque in the proximal RCA causing minimal (0-24%) stenosis.  Calcium score was 32 (73rd percentile for age/gender).  Rosuvastatin was increased to 40 mg daily.   Since last clinic visit, she reports that she has been doing well.  States that in December she was having a lot of stress at work and was having significant palpitations.  Reports palpitations have significantly improved, has not had any in the last month.  LDL 57 on 03/11/2020.  Denies any  chest pain, dyspnea, lightheadedness, syncope, lower extremity edema.  Has not been exercising.    Past Medical History:  Diagnosis Date  . Anemia   . Arthritis   . Asthma   . Cancer ([place])   . Hyperlipidemia   . Neuromuscular disorder ([place])   . Osteoporosis   . Thyroid disease     Past Surgical History:  Procedure Laterality Date  . APPENDECTOMY    . BREAST SURGERY    . FOOT SURGERY    . LAPROSCOPIC    . WISDOM TEETH REMOVAL      Current Medications: Current Meds  Medication Sig  . 5-HTP CAPS Take 2 tablets by mouth at bedtime.  Marland Kitchen albuterol (PROVENTIL HFA;VENTOLIN HFA) 108 (90 BASE) MCG/ACT inhaler Inhale 2 puffs into the lungs every 6 (six) hours as needed for wheezing or shortness of breath.  . Biotin 5000 MCG CAPS Take 5,000 mcg by mouth daily.  . Cholecalciferol (VITAMIN D3) 5000 units CAPS Take 10,000 Units by mouth once a week.  . Coenzyme Q10 (COQ-10) 100 MG CAPS   . gabapentin (NEURONTIN) 300 MG capsule TAKE TWO CAPSULES BY MOUTH DAILY AT BEDTIME  . Glucosamine 500 MG TABS Take 1,500 mg by mouth daily.  Marland Kitchen levothyroxine (SYNTHROID, LEVOTHROID) 50 MCG tablet Take 50 mcg by mouth every other day.  . levothyroxine (SYNTHROID, LEVOTHROID) 75 MCG tablet Take 75 mcg by mouth every other day.  . Magnesium 500 MG CAPS Take 500 mg by mouth daily.  . meloxicam (MOBIC) 15 MG tablet Take 1  tablet (15 mg total) by mouth daily.  . methocarbamol (ROBAXIN) 500 MG tablet 1 tablet p.o. nightly as needed  . Misc Natural Products (TART CHERRY ADVANCED PO) Take by mouth.  . SYMBICORT 160-4.5 MCG/ACT inhaler INHALE 2 PUFFS INTO THE LUNGS BY MOUTH IN THE MORNING AND THEN ANOTHER 2 PUFFS 12 HOURS LATER  . TURMERIC PO Take 1 tablet by mouth daily.  Marland Kitchen zolpidem (AMBIEN) 10 MG tablet Take 1 tablet (10 mg total) by mouth at bedtime.  . [DISCONTINUED] Glucosamine 750 MG TABS 2 CAPSULES  . [DISCONTINUED] Magnesium 500 MG TABS Take 500 mg by mouth 2 (two) times daily.  . [DISCONTINUED]  rosuvastatin (CRESTOR) 20 MG tablet Take 1 tablet (20 mg total) by mouth daily.     Allergies:   Compazine   Social History   Socioeconomic History  . Marital status: Married    Spouse name: Not on file  . Number of children: Not on file  . Years of education: Not on file  . Highest education level: Not on file  Occupational History  . Not on file  Tobacco Use  . Smoking status: Never Smoker  . Smokeless tobacco: Never Used  Vaping Use  . Vaping Use: Never used  Substance and Sexual Activity  . Alcohol use: Yes    Comment: occ  . Drug use: Never  . Sexual activity: Not on file  Other Topics Concern  . Not on file  Social History Narrative  . Not on file   Social Determinants of Health   Financial Resource Strain: Not on file  Food Insecurity: Not on file  Transportation Needs: Not on file  Physical Activity: Not on file  Stress: Not on file  Social Connections: Not on file     Family History: The patient's family history includes Cancer in her mother; Cancer (age of onset: 63) in her father; Dementia in her mother; Hyperlipidemia in her father and mother; Hypertension in her brother and mother; Prostate cancer in her father; Stroke in her father and mother.  ROS:   Please see the history of present illness.    All other systems reviewed and are negative.  EKGs/Labs/Other Studies Reviewed:    The following studies were reviewed today:   EKG:  EKG is ordered today.  The ekg ordered today demonstrates normal sinus rhythm, first-degree AV block, left axis deviation, rate 74  Recent Labs: No results found for requested labs within last 8760 hours.  Recent Lipid Panel No results found for: CHOL, TRIG, HDL, CHOLHDL, VLDL, LDLCALC, LDLDIRECT   TTE 12/20/18: 1. Left ventricular ejection fraction, by visual estimation, is 60 to  65%. The left ventricle has normal function. There is no left ventricular  hypertrophy. Normal diastolic function.  2. Global right  ventricle has normal systolic function.The right  ventricular size is normal. No increase in right ventricular wall  thickness.  3. Left atrial size was normal.  4. Right atrial size was normal.  5. Moderate thickening of the mitral valve leaflet(s).  6. The mitral valve is normal in structure. Trace mitral valve  regurgitation.  7. The tricuspid valve is normal in structure. Tricuspid valve  regurgitation is trivial.  8. The aortic valve was not well visualized. Aortic valve regurgitation  is not visualized. No evidence of aortic valve sclerosis or stenosis.  9. The pulmonic valve was not well visualized. Pulmonic valve  regurgitation is not visualized.  10. Mildly elevated pulmonary artery systolic pressure.  11. The inferior vena cava  is normal in size with greater than 50%  respiratory variability, suggesting right atrial pressure of 3 mmHg.  12. The tricuspid regurgitant velocity is 2.31 m/s, and with an assumed  right atrial pressure of 10 mmHg, the estimated right ventricular systolic  pressure is mildly elevated at 31.3 mmHg.   CTA 01/14/19: 1. Coronary calcium score of 32. This was 7 percentile for age and sex matched control. 2.  Normal coronary origin with right dominance. 3. Nonobstructive CAD, with calcified plaque in the proximal LAD causing minimal (0-24%) stenosis and noncalcified plaque in the proximal RCA causing minimal (0-24%) stenosis  CAD-RADS 1. Minimal non-obstructive CAD (0-24%). Consider non-atherosclerotic causes of chest pain. Consider preventive therapy and risk factor modification.  Noncardiac: IMPRESSION: 1.  Aortic Atherosclerosis (ICD10-I70.0).  Cardiac monitor 02/16/19:  No significant arrhythmias  Patient triggered events corresponded to sinus rhythm +/- PACs   Predominant rhythm is sinus rhythm. Range is 58-142 bpm with average of 78 bpm. No atrial fibrillation, sustained ventricular tachycardia, significant pause, or high degree  AV block. Total ectopy <1%. 20 patient triggered events, corresponding to sinus rhythm  +/- PACs  No significant abnormalities.  Physical Exam:    VS:  BP 90/60   Pulse 74   Ht 5\' 7"  (1.702 m)   Wt 151 lb 12.8 oz (68.9 kg)   BMI 23.78 kg/m     Wt Readings from Last 3 Encounters:  03/21/20 151 lb 12.8 oz (68.9 kg)  03/20/20 153 lb (69.4 kg)  10/03/19 150 lb 6.4 oz (68.2 kg)     GEN:  Well nourished, well developed in no acute distress HEENT: Normal NECK: No JVD; No carotid bruits CARDIAC: RRR, no murmurs, rubs, gallops RESPIRATORY:  Expiratory wheezing ABDOMEN: Soft, non-tender, non-distended MUSCULOSKELETAL:  No edema; No deformity  SKIN: Warm and dry NEUROLOGIC:  Alert and oriented x 3 PSYCHIATRIC:  Normal affect   ASSESSMENT:    1. Coronary artery disease involving native coronary artery of native heart without angina pectoris   2. Hyperlipidemia, unspecified hyperlipidemia type   3. Chest pain of uncertain etiology   4. Palpitations    PLAN:     Coronary artery disease: Coronary CTA on 01/14/2019  showed nonobstructive CAD with calcified plaque in the proximal LAD causing minimal (0-24%) stenosis and noncalcified plaque in the proximal RCA causing minimal (0-24%) stenosis.  Calcium score was 32 (73rd percentile for age/gender) -Rosuvastatin increased to 40 mg daily, developed myalgias, so was decreased to 20 mg daily and Zetia 10 mg daily was added.  LDL 57 on 03/11/2020, at goal less than 70.  Continue rosuvastatin and Zetia.  Chest pain: Atypical in description, as describes substernal chest pressure but not related to exertion.  Coronary CTA showed nonobstructive CAD.  Reports chest pain has resolved.  Palpitations: Cardiac monitor showed symptoms corresponding to PACs.  Reports no recent palpitations.  Suspect driven by stress.  No indication for treatment at this time  Hyperlipidemia: Continue rosuvastatin 20 mg daily and Zetia 10 mg daily, LDL 57 on  03/11/2020.  RTC in 1 year   Medication Adjustments/Labs and Tests Ordered: Current medicines are reviewed at length with the patient today.  Concerns regarding medicines are outlined above.  Orders Placed This Encounter  Procedures  . EKG 12-Lead   Meds ordered this encounter  Medications  . ezetimibe (ZETIA) 10 MG tablet    Sig: Take 1 tablet (10 mg total) by mouth daily.    Dispense:  90 tablet    Refill:  3  . rosuvastatin (CRESTOR) 20 MG tablet    Sig: Take 1 tablet (20 mg total) by mouth daily.    Dispense:  90 tablet    Refill:  3    Dose change    Patient Instructions  Medication Instructions:  Your physician recommends that you continue on your current medications as directed. Please refer to the Current Medication list given to you today.  *If you need a refill on your cardiac medications before your next appointment, please call your pharmacy*  Follow-Up: At [facility], you and your health needs are our priority.  As part of our continuing mission to provide you with exceptional heart care, we have created designated Provider Care Teams.  These Care Teams include your primary Cardiologist (physician) and Advanced Practice Providers (APPs -  Physician Assistants and Nurse Practitioners) who all work together to provide you with the care you need, when you need it.  We recommend signing up for the patient portal called "MyChart".  Sign up information is provided on this After Visit Summary.  MyChart is used to connect with patients for Virtual Visits (Telemedicine).  Patients are able to view lab/test results, encounter notes, upcoming appointments, etc.  Non-urgent messages can be sent to your provider as well.   To learn more about what you can do with MyChart, go to [email or web address].    Your next appointment:   12 month(s)  The format for your next appointment:   In Person  Provider:   Oswaldo Milian, MD      Signed, Donato Heinz,  MD  03/21/2020 8:44 AM    [place]

## 2020-03-21 NOTE — Patient Instructions (Signed)

## 2020-04-15 ENCOUNTER — Other Ambulatory Visit: Payer: Self-pay | Admitting: Rheumatology

## 2020-04-15 MED ORDER — ZOLPIDEM TARTRATE 10 MG PO TABS: 10.0000 mg | ORAL_TABLET | Freq: Every day | ORAL | 0 refills | Status: DC

## 2020-04-15 MED ORDER — MELOXICAM 15 MG PO TABS: 15.0000 mg | ORAL_TABLET | Freq: Every day | ORAL | 0 refills | Status: DC

## 2020-04-15 MED ORDER — GABAPENTIN 300 MG PO CAPS: ORAL_CAPSULE | ORAL | 0 refills | Status: DC

## 2020-04-15 NOTE — Telephone Encounter (Signed)
Patient needs refill on Meloxicam. Neuorotin, and Ambien 10 mg. Patient requesting 90 day supply on all with a refill , if possible. Please send all to [facility] at [place].

## 2020-04-15 NOTE — Telephone Encounter (Signed)
Next Visit: 09/19/2020  Last Visit: 03/20/2020  Last Fill: 01/16/2020  Dx:  Fibromyalgia and Primary insomnia  Current Dose per office note on 03/20/2020, ambien 10 mg p.o. nightly for insomnia, gabapentin 300 mg capsule 2 capsules at bedtime and   Okay to refill Neurontin, Meloxicam and  Ambien?

## 2020-05-14 ENCOUNTER — Ambulatory Visit (INDEPENDENT_AMBULATORY_CARE_PROVIDER_SITE_OTHER): Payer: BC Managed Care – PPO | Admitting: Otolaryngology

## 2020-07-19 ENCOUNTER — Other Ambulatory Visit: Payer: Self-pay

## 2020-07-19 MED ORDER — ZOLPIDEM TARTRATE 10 MG PO TABS: 10.0000 mg | ORAL_TABLET | Freq: Every day | ORAL | 0 refills | Status: DC

## 2020-07-19 MED ORDER — METHOCARBAMOL 750 MG PO TABS: 750.0000 mg | ORAL_TABLET | Freq: Every evening | ORAL | 0 refills | Status: DC | PRN

## 2020-07-19 MED ORDER — MELOXICAM 15 MG PO TABS: 15.0000 mg | ORAL_TABLET | Freq: Every day | ORAL | 0 refills | Status: DC

## 2020-07-19 MED ORDER — GABAPENTIN 300 MG PO CAPS: ORAL_CAPSULE | ORAL | 0 refills | Status: DC

## 2020-07-19 NOTE — Addendum Note (Signed)
Addended by: Carole Binning on: 07/19/2020 10:25 AM   Modules accepted: Orders

## 2020-07-19 NOTE — Telephone Encounter (Signed)
Patient called requesting prescription refills (90 day supply) of Gabapentin, Zolpidem, and Mobic to be sent to [facility] at [facility].  Patient is also requesting prescription refill of Robaxin 750 mg to be sent to [facility] at [facility].  Patient states the 500 mg didn't help and needs something stronger.

## 2020-07-19 NOTE — Telephone Encounter (Deleted)
Patient calling stating refills were sent into incorrect pharmacy. Patient needs refills sent to [facility] on [address].

## 2020-07-19 NOTE — Addendum Note (Signed)
Addended by: Carole Binning on: 07/19/2020 01:28 PM   Modules accepted: Orders

## 2020-07-19 NOTE — Telephone Encounter (Signed)
Patient calling because refills sent to incorrect pharmacy. Patient requested refills to be sent to [facility] on [address].

## 2020-07-19 NOTE — Telephone Encounter (Signed)
Next Visit: 09/19/2020   Last Visit: 03/20/2020   Last Fill: 04/15/2020  Dx:  Fibromyalgia and Primary insomnia   Current Dose per office note on 03/20/2020, ambien 10 mg p.o. nightly for insomnia, gabapentin 300 mg capsule 2 capsules at bedtime and   Labs: 03/11/2020 ALT 33    Okay to refill Neurontin, Meloxicam and  Ambien?   Patient is requesting Methocarbamol 750 mg instead of 500 mg. Patient states didn't help and needs something stronger. Please advise.

## 2020-07-26 ENCOUNTER — Other Ambulatory Visit: Payer: Self-pay | Admitting: Rheumatology

## 2020-07-31 ENCOUNTER — Telehealth: Payer: Self-pay | Admitting: Internal Medicine

## 2020-07-31 MED ORDER — PANTOPRAZOLE SODIUM 40 MG PO TBEC: 40.0000 mg | DELAYED_RELEASE_TABLET | Freq: Every day | ORAL | 1 refills | Status: DC

## 2020-07-31 NOTE — Telephone Encounter (Signed)
Refill has been sent to [facility] per pts request.  I have LM on VM to make her aware. Nothing further is needed.

## 2020-09-05 NOTE — Progress Notes (Signed)
Office Visit Note  Patient: Michelle Gray             Date of Birth: 13-Dec-1955           MRN: [id number]             PCP: Cari Caraway, MD Referring: Cari Caraway, MD Visit Date: 09/19/2020 Occupation: '@GUAROCC'$ @  Subjective:  Medication management   History of Present Illness: Michelle Gray is a 65 y.o. female with a history of fibromyalgia, osteoarthritis and chronic insomnia.  She states that she has been traveling recently and has been experiencing increased pain.  She states holding a speculum hurts her hands especially her CMC joints.  She has been also having medial epicondylitis in her left elbow and lateral epicondylitis in her right elbow joint.  Her left trochanteric bursa has been painful.  She has not noticed any joint swelling.  She continues to have some lower back discomfort.  She has been doing stretching exercises which helped.  She has been taking methocarbamol on a as needed basis she takes gabapentin and meloxicam on a regular basis for pain relief.  She also takes Ambien 10 mg at bedtime with helps her to sleep.  Activities of Daily Living:  Patient reports morning stiffness for 10-15 minutes.   Patient Reports nocturnal pain.  Difficulty dressing/grooming: Denies Difficulty climbing stairs: Denies Difficulty getting out of chair: Denies Difficulty using hands for taps, buttons, cutlery, and/or writing: Reports  Review of Systems  Constitutional:  Positive for fatigue.  HENT:  Positive for mouth dryness and nose dryness. Negative for mouth sores.   Eyes:  Positive for dryness. Negative for pain and itching.  Respiratory:  Negative for shortness of breath and difficulty breathing.   Cardiovascular:  Negative for chest pain and palpitations.  Gastrointestinal:  Negative for blood in stool, constipation and diarrhea.  Endocrine: Negative for increased urination.  Genitourinary:  Negative for difficulty urinating.  Musculoskeletal:  Positive for joint  pain, joint pain, myalgias, morning stiffness, muscle tenderness and myalgias. Negative for joint swelling.  Skin:  Negative for color change, rash and redness.  Allergic/Immunologic: Negative for susceptible to infections.  Neurological:  Negative for dizziness, numbness, headaches, memory loss and weakness.  Hematological:  Negative for bruising/bleeding tendency.  Psychiatric/Behavioral:  Negative for confusion.    PMFS History:  Patient Active Problem List   Diagnosis Date Noted  . Cough variant asthma with ? component upper airway cough syndrome 12/23/2018  . Primary osteoarthritis of both hands 02/21/2016  . Fibromyalgia 02/21/2016  . History of hypothyroidism 02/21/2016  . Dyslipidemia 02/21/2016  . History of breast cancer 02/21/2016  . Age-related osteoporosis without current pathological fracture 02/21/2016  . Unspecified hypothyroidism 08/06/2013  . Other and unspecified hyperlipidemia 08/06/2013  . Insomnia 08/06/2013  . Intrinsic asthma 08/06/2013  . Hip pain, left 01/12/2011  . Plantar fasciitis 01/12/2011    Past Medical History:  Diagnosis Date  . Anemia   . Arthritis   . Asthma   . Cancer ([place])   . Hyperlipidemia   . Neuromuscular disorder ([place])   . Osteoporosis   . Thyroid disease     Family History  Problem Relation Age of Onset  . Cancer Mother   . Hyperlipidemia Mother   . Hypertension Mother   . Stroke Mother   . Dementia Mother   . Hyperlipidemia Father   . Stroke Father   . Prostate cancer Father   . Cancer Father 40  Prostate  . Hypertension Brother    Past Surgical History:  Procedure Laterality Date  . APPENDECTOMY    . BREAST SURGERY    . FOOT SURGERY    . LAPROSCOPIC    . WISDOM TEETH REMOVAL     Social History   Social History Narrative  . Not on file   Immunization History  Administered Date(s) Administered  . Influenza-Unspecified 10/04/2018  . PFIZER(Purple Top)SARS-COV-2 Vaccination 01/31/2019, 02/21/2019,  11/04/2019, 05/18/2020     Objective: Vital Signs: BP 123/73 (BP Location: Left Arm, Patient Position: Sitting, Cuff Size: Normal)   Pulse 80   Ht 5' 7.5" (1.715 m)   Wt 158 lb 9.6 oz (71.9 kg)   BMI 24.47 kg/m    Physical Exam Vitals and nursing note reviewed.  Constitutional:      Appearance: She is well-developed.  HENT:     Head: Normocephalic and atraumatic.  Eyes:     Conjunctiva/sclera: Conjunctivae normal.  Cardiovascular:     Rate and Rhythm: Normal rate and regular rhythm.     Heart sounds: Normal heart sounds.  Pulmonary:     Effort: Pulmonary effort is normal.     Breath sounds: Normal breath sounds.  Abdominal:     General: Bowel sounds are normal.     Palpations: Abdomen is soft.  Musculoskeletal:     Cervical back: Normal range of motion.  Lymphadenopathy:     Cervical: No cervical adenopathy.  Skin:    General: Skin is warm and dry.     Capillary Refill: Capillary refill takes less than 2 seconds.  Neurological:     Mental Status: She is alert and oriented to person, place, and time.  Psychiatric:        Behavior: Behavior normal.     Musculoskeletal Exam: C-spine thoracic and lumbar spine were in good range of motion.  Shoulder joints, elbow joints, wrist joints, MCPs PIPs and DIPs with good range of motion with no synovitis.  Hip joints, knee joints, ankles, MTPs and PIPs with good range of motion with no synovitis.  She had tenderness over right lateral epicondyle and left medial epicondyle region.  She also had tenderness over left trochanteric bursa.  CDAI Exam: CDAI Score: -- Patient Global: --; Provider Global: -- Swollen: --; Tender: -- Joint Exam 09/19/2020   No joint exam has been documented for this visit   There is currently no information documented on the homunculus. Go to the Rheumatology activity and complete the homunculus joint exam.  Investigation: No additional findings.  Imaging: No results found.  Recent Labs: Lab  Results  Component Value Date   WBC 16.8 (H) 08/06/2013   HGB 13.4 08/06/2013   PLT 391 08/06/2013   September 16, 2020 CBC WBC 6.2, hemoglobin 13.8, platelets 315, BMP GFR 78, LDL 69, TSH 0.63  Speciality Comments: No specialty comments available.  Procedures:  No procedures performed Allergies: Compazine   Assessment / Plan:     Visit Diagnoses: Fibromyalgia -she continues to have some generalized pain and discomfort from fibromyalgia.  She has been taking meloxicam 15 mg tablet, gabapentin 300 mg capsule 2 capsules at bedtime, robaxin '750mg'$  by mouth at bedtime as needed.  The combination has been working well for her.  She has been also doing some stretching exercises.  She brought her most recent labs from August 2 which include CBC and BMP which were normal.  She states that she  also had CMP which was normal and she did not have  a copy with her today.  Other fatigue-related to fibromyalgia.  Primary insomnia -she states taking Ambien 10 mg p.o. nightly helps her sleep through the night.  She will try to taper it in the future.   Raynaud's disease without gangrene-currently not active.  Lateral epicondylitis, right elbow-she had tenderness over the right lateral epicondyle region.  Use of Salonpas was discussed.  Medial epicondylitis, left-forearm exercises and topical analgesic were discussed.  Primary osteoarthritis of both hands-she continues to have some discomfort in her hands especially over her [place] joints due to her work.  Trochanteric bursitis of both hips-she had left trochanteric bursitis.  Stretching exercises were demonstrated in the office.  She does have a handout at home.  Plantar fasciitis-she is off-and-on discomfort in her plantar fascia.  Currently not having much symptoms.  DDD (degenerative disc disease), lumbar - She has multilevel spondylosis and facet joint arthropathy.  She continues to have lower back pain.  She states the pain gets better when she does  stretching exercises on a regular basis.  Osteopenia of multiple sites - DEXA 12/01/19 T-score: -2.0, BMD: 0.622. She had GI intolerance to bisphosphonates in the past.  She will have repeat DEXA in 2023 .  History of breast cancer  History of hyperlipidemia  History of hypothyroidism  Orders: No orders of the defined types were placed in this encounter.  No orders of the defined types were placed in this encounter.    Follow-Up Instructions: Return in about 6 months (around 03/22/2021) for FMS.   Bo Merino, MD  Note - This record has been created using Editor, commissioning.  Chart creation errors have been sought, but may not always  have been located. Such creation errors do not reflect on  the standard of medical care.

## 2020-09-19 ENCOUNTER — Other Ambulatory Visit: Payer: Self-pay

## 2020-09-19 ENCOUNTER — Encounter: Payer: Self-pay | Admitting: Rheumatology

## 2020-09-19 ENCOUNTER — Ambulatory Visit: Payer: BC Managed Care – PPO | Admitting: Rheumatology

## 2020-09-19 VITALS — BP 123/73 | HR 80 | Ht 67.5 in | Wt 158.6 lb

## 2020-09-19 DIAGNOSIS — M7702 Medial epicondylitis, left elbow: Secondary | ICD-10-CM

## 2020-09-19 DIAGNOSIS — M797 Fibromyalgia: Secondary | ICD-10-CM

## 2020-09-19 DIAGNOSIS — I7300 Raynaud's syndrome without gangrene: Secondary | ICD-10-CM

## 2020-09-19 DIAGNOSIS — M19042 Primary osteoarthritis, left hand: Secondary | ICD-10-CM

## 2020-09-19 DIAGNOSIS — M7062 Trochanteric bursitis, left hip: Secondary | ICD-10-CM

## 2020-09-19 DIAGNOSIS — Z8639 Personal history of other endocrine, nutritional and metabolic disease: Secondary | ICD-10-CM

## 2020-09-19 DIAGNOSIS — M7061 Trochanteric bursitis, right hip: Secondary | ICD-10-CM

## 2020-09-19 DIAGNOSIS — M8589 Other specified disorders of bone density and structure, multiple sites: Secondary | ICD-10-CM

## 2020-09-19 DIAGNOSIS — M19041 Primary osteoarthritis, right hand: Secondary | ICD-10-CM

## 2020-09-19 DIAGNOSIS — Z853 Personal history of malignant neoplasm of breast: Secondary | ICD-10-CM

## 2020-09-19 DIAGNOSIS — M5136 Other intervertebral disc degeneration, lumbar region: Secondary | ICD-10-CM

## 2020-09-19 DIAGNOSIS — R5383 Other fatigue: Secondary | ICD-10-CM

## 2020-09-19 DIAGNOSIS — F5101 Primary insomnia: Secondary | ICD-10-CM | POA: Diagnosis not present

## 2020-09-19 DIAGNOSIS — M7711 Lateral epicondylitis, right elbow: Secondary | ICD-10-CM

## 2020-09-19 DIAGNOSIS — M722 Plantar fascial fibromatosis: Secondary | ICD-10-CM

## 2020-10-02 ENCOUNTER — Ambulatory Visit: Payer: BC Managed Care – PPO | Admitting: Internal Medicine

## 2020-10-14 ENCOUNTER — Other Ambulatory Visit: Payer: Self-pay

## 2020-10-14 ENCOUNTER — Ambulatory Visit: Payer: BC Managed Care – PPO | Admitting: Internal Medicine

## 2020-10-14 ENCOUNTER — Encounter: Payer: Self-pay | Admitting: Internal Medicine

## 2020-10-14 DIAGNOSIS — J45991 Cough variant asthma: Secondary | ICD-10-CM | POA: Diagnosis not present

## 2020-10-14 MED ORDER — SYMBICORT 160-4.5 MCG/ACT IN AERO: INHALATION_SPRAY | RESPIRATORY_TRACT | 11 refills | Status: DC

## 2020-10-14 NOTE — Patient Instructions (Signed)
Restart protonix 40 mg Take 30-60 min before first meal of the day x at least a month to see what benefit if any it has on the cough  Please schedule a follow up visit in 12  months but call sooner if needed

## 2020-10-14 NOTE — Progress Notes (Signed)
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     History of Present Illness  12/22/2018  Pulmonary/ 1st office eval/Michelle Gray  Chief Complaint  Patient presents with   Pulmonary Consult    Self referral- asthma- trouble taking a deep breath and wheezing. She is using her albuterol inhaler daily.   Dyspnea:  Ex bike not using and limited by fibromyalgia/ plantar fasciitis/ ok with yard work and steps / some chest discomfort midline with deep breath but not with ex Cough: not a lot but finds herself throat clearing x 30 years  " just her like her dad" no mucus production and only happens while awake  Sleep: no resp problem SABA use: p symb 160 x 2 6 am  Typically feels needs saba w/in a few hours  No nasal symptoms  rec Plan A = Automatic = Always=    symbicort 160 Take 2 puffs first thing in am and then another 2 puffs about 12 hours later.  Work on inhaler technique: Plan B = Backup (to supplement plan A, not to replace it) Only use your albuterol inhaler as a rescue medication  Pantoprazole (protonix) 40 mg   Take  30-60 min before first meal of the day and Pepcid (famotidine)  20 mg one @  After supper   GERD diet  televisit  01/19/19 rec: Add gabapentin 300 mg every morning to see if helps the daytime   10/03/2019  f/u ov/Michelle Gray re:  Chief Complaint  Patient presents with   Follow-up    Denies any problems  Dyspnea:   Limited some by feet but not by breathing Cough: none Sleeping: fine flat SABA use: none 02: none  Rec No change rx   10/14/2020  f/u ov/Michelle Gray re: asthma   maint on symbicort 160   Chief Complaint  Patient presents with   Follow-up    Increased cough for the past month-occurs when she takes a deep breath. She has occ chest tightness.    Dyspnea:  no aerobics Cough: on deep insp, no ex/ dry x 4 weeks  Sleeping: slt elevation sleep number  SABA use: rarely / eg uri  02: none  Covid status:   vax x 4, never infected    No obvious day to day or daytime variability or assoc excess/ purulent sputum or mucus plugs or hemoptysis or cp or chest tightness, subjective wheeze or overt sinus or hb symptoms.   Sleeping  without nocturnal  or early am exacerbation  of respiratory  c/o's or need for noct saba. Also denies any obvious fluctuation of symptoms with weather or environmental changes or other aggravating or alleviating factors except as outlined above   No unusual exposure hx or h/o childhood pna/ asthma or knowledge of premature birth.  Current Allergies, Complete Past Medical History, Past Surgical History, Family History, and  Social History were reviewed in [employer] record.  ROS  The following are not active complaints unless bolded Hoarseness, sore throat, dysphagia, dental problems, itching, sneezing,  nasal congestion or discharge of excess mucus or purulent secretions, ear ache,   fever, chills, sweats, unintended wt loss or wt gain, classically pleuritic or exertional cp,  orthopnea pnd or arm/hand swelling  or leg swelling, presyncope, palpitations, abdominal pain, anorexia, nausea, vomiting, diarrhea  or change in bowel habits or change in bladder habits, change in stools or change in urine, dysuria, hematuria,  rash, arthralgias, visual complaints, headache, numbness, weakness or ataxia or problems with walking or coordination,  change in mood or   memory.        Current Meds  Medication Sig   5-HTP CAPS Take 2 tablets by mouth at bedtime.   albuterol (PROVENTIL HFA;VENTOLIN HFA) 108 (90 BASE) MCG/ACT inhaler Inhale 2 puffs into the lungs every 6 (six) hours as needed for wheezing or shortness of breath.   Biotin 5000 MCG CAPS Take 5,000 mcg by mouth daily.   Cholecalciferol (VITAMIN D3) 5000 units CAPS Take 10,000 Units by mouth once a week.   Coenzyme Q10 (COQ-10) 100 MG CAPS    ezetimibe (ZETIA) 10 MG tablet Take 1 tablet (10 mg total) by mouth daily.   gabapentin (NEURONTIN) 300 MG capsule TAKE TWO CAPSULES BY MOUTH DAILY AT BEDTIME   Glucosamine 500 MG TABS Take 1,500 mg by mouth daily.   levothyroxine (SYNTHROID, LEVOTHROID) 50 MCG tablet Take 50 mcg by mouth every other day.   levothyroxine (SYNTHROID, LEVOTHROID) 75 MCG tablet Take 75 mcg by mouth every other day.   Magnesium 500 MG CAPS Take 500 mg by mouth daily.   meloxicam (MOBIC) 15 MG tablet Take 1 tablet (15 mg total) by mouth daily.   methocarbamol (ROBAXIN) 750 MG tablet Take 1 tablet (750 mg total) by mouth at bedtime as needed for muscle spasms.   Misc Natural Products (TART CHERRY ADVANCED PO) Take by mouth.   pantoprazole (PROTONIX) 40 MG tablet Take 1 tablet (40 mg total) by mouth daily. (Patient taking differently: Take 40 mg by mouth daily as needed.)   rosuvastatin (CRESTOR) 20 MG tablet Take 1 tablet (20 mg total) by mouth daily.   SYMBICORT 160-4.5 MCG/ACT inhaler INHALE 2 PUFFS INTO THE LUNGS BY MOUTH IN THE MORNING AND THEN ANOTHER 2 PUFFS 12 HOURS LATER   TURMERIC PO Take 1 tablet by mouth daily.   zolpidem (AMBIEN) 10 MG tablet Take 1 tablet (10 mg total) by mouth at bedtime.                Past Medical History:  Diagnosis Date   Anemia    Arthritis    Asthma    Cancer ([place])    Hyperlipidemia    Neuromuscular disorder ([place])    Osteoporosis    Thyroid disease         Objective:       10/14/2020        155   10/03/19 150 lb 6.4 oz  (68.2 kg)  09/20/19 151 lb 9.6 oz (68.8 kg)  09/18/19 153 lb 12.8 oz (69.8 kg)      Vital signs reviewed  10/14/2020  - Note at rest 02 sats  100% on RA   General appearance:    amb healthy appearing wf nad    HEENT : pt wearing mask not removed for exam due to covid -19 concerns.  NECK :  without JVD/Nodes/TM/ nl carotid upstrokes bilaterally   LUNGS: no acc muscle use,  Nl contour chest which is clear to A and P bilaterally without cough on insp maneuvers on today's eval    CV:  RRR  no s3 or murmur or increase in P2, and no edema   ABD:  soft and nontender with nl inspiratory excursion in the supine position. No bruits or organomegaly appreciated, bowel sounds nl  MS:  Nl gait/ ext warm without deformities, calf tenderness, cyanosis or clubbing No obvious joint restrictions   SKIN: warm and dry without lesions    NEURO:  alert, approp, nl sensorium with  no motor or cerebellar deficits apparent.                Assessment

## 2020-10-14 NOTE — Assessment & Plan Note (Addendum)
?  Onset 1990s - initial pulmonary eval 2005 [facility] (paper chart requested) - worse since 2018 rx symb 80 2bid and increased to 160 2bid  Oct 2020 - spacer added 12/22/2018 as has component of uacs > improved 01/19/2019 but not resolved - 01/19/2019  rec titrate gabapentin to max of 300 qid to see if helps> never needed  - flared off ppi > restart 10/14/2020   All goals of chronic asthma control met including optimal function and elimination of symptoms with minimal need for rescue therapy.  Contingencies discussed in full including contacting this office immediately if not controlling the symptoms using the rule of two's.     Cough likely related to gerd as occurs on inspiration variably and not reproducible today    Rec: Trial of gerd rx/ no change symb 160 2bid and f/u yearly, call sooner if needed          Each maintenance medication was reviewed in detail including emphasizing most importantly the difference between maintenance and prns and under what circumstances the prns are to be triggered using an action plan format where appropriate.  Total time for H and P, chart review, counseling, reviewing hfa device(s) and generating customized AVS unique to this office visit / same day charting = 24 min        

## 2020-10-21 ENCOUNTER — Other Ambulatory Visit: Payer: Self-pay

## 2020-10-21 MED ORDER — ZOLPIDEM TARTRATE 10 MG PO TABS: 10.0000 mg | ORAL_TABLET | Freq: Every day | ORAL | 0 refills | Status: DC

## 2020-10-21 MED ORDER — GABAPENTIN 300 MG PO CAPS: ORAL_CAPSULE | ORAL | 0 refills | Status: DC

## 2020-10-21 MED ORDER — MELOXICAM 15 MG PO TABS: 15.0000 mg | ORAL_TABLET | Freq: Every day | ORAL | 0 refills | Status: DC

## 2020-10-21 NOTE — Telephone Encounter (Signed)
Patient called requesting prescription refills of Ambien, Mobic, and Neurontin (90 day supply for all 3) to be sent to [place] at [facility].

## 2020-10-21 NOTE — Telephone Encounter (Signed)
Next Visit: 03/24/2021  Last Visit: 09/19/2020  Last Fill: 07/19/2020  DX: Fibromyalgia, Primary insomnia  Current Dose per office note on 09/19/2020: meloxicam 15 mg tablet, gabapentin 300 mg capsule 2 capsules at bedtime, Ambien 10 mg p.o. nightly    Okay to refill ambien, gabapentin and meloxicam?

## 2020-12-09 ENCOUNTER — Other Ambulatory Visit: Payer: Self-pay | Admitting: Obstetrics & Gynecology

## 2020-12-09 DIAGNOSIS — Z853 Personal history of malignant neoplasm of breast: Secondary | ICD-10-CM

## 2020-12-25 ENCOUNTER — Ambulatory Visit
Admission: RE | Admit: 2020-12-25 | Discharge: 2020-12-25 | Disposition: A | Discharge status: Home / Self Care | Payer: Medicare HMO | Source: Ambulatory Visit | Attending: Obstetrics & Gynecology | Admitting: Obstetrics & Gynecology

## 2020-12-25 ENCOUNTER — Other Ambulatory Visit: Payer: Self-pay

## 2020-12-25 DIAGNOSIS — Z853 Personal history of malignant neoplasm of breast: Secondary | ICD-10-CM

## 2020-12-25 DIAGNOSIS — Z1239 Encounter for other screening for malignant neoplasm of breast: Secondary | ICD-10-CM | POA: Diagnosis not present

## 2020-12-25 MED ORDER — GADOBUTROL 1 MMOL/ML IV SOLN
7.0000 mL | Freq: Once | INTRAVENOUS | Status: AC | PRN
  Administered 2020-12-25: 7 mL via INTRAVENOUS

## 2020-12-30 ENCOUNTER — Other Ambulatory Visit: Payer: Self-pay | Admitting: Obstetrics & Gynecology

## 2020-12-30 DIAGNOSIS — R9389 Abnormal findings on diagnostic imaging of other specified body structures: Secondary | ICD-10-CM

## 2021-01-06 ENCOUNTER — Other Ambulatory Visit (HOSPITAL_COMMUNITY): Payer: Self-pay | Admitting: Diagnostic Radiology

## 2021-01-06 ENCOUNTER — Ambulatory Visit
Admission: RE | Admit: 2021-01-06 | Discharge: 2021-01-06 | Disposition: A | Discharge status: Home / Self Care | Payer: Medicare HMO | Source: Ambulatory Visit | Attending: Obstetrics & Gynecology | Admitting: Obstetrics & Gynecology

## 2021-01-06 ENCOUNTER — Other Ambulatory Visit: Payer: Self-pay

## 2021-01-06 DIAGNOSIS — R9389 Abnormal findings on diagnostic imaging of other specified body structures: Secondary | ICD-10-CM

## 2021-01-06 DIAGNOSIS — D0512 Intraductal carcinoma in situ of left breast: Secondary | ICD-10-CM | POA: Diagnosis not present

## 2021-01-06 DIAGNOSIS — N6321 Unspecified lump in the left breast, upper outer quadrant: Secondary | ICD-10-CM | POA: Diagnosis not present

## 2021-01-06 MED ORDER — GADOBUTROL 1 MMOL/ML IV SOLN
7.0000 mL | Freq: Once | INTRAVENOUS | Status: AC | PRN
  Administered 2021-01-06: 7 mL via INTRAVENOUS

## 2021-01-08 ENCOUNTER — Telehealth: Payer: Self-pay | Admitting: Hematology and Oncology

## 2021-01-08 NOTE — Telephone Encounter (Signed)
Scheduled appt per 12/7 staff msg from [facility]. Pt is aware of appt date and time. Pt also stated she has records from her previous Breast Cancer history and will bring those in to her appt.

## 2021-01-10 ENCOUNTER — Other Ambulatory Visit: Payer: Self-pay

## 2021-01-10 MED ORDER — GABAPENTIN 300 MG PO CAPS: ORAL_CAPSULE | ORAL | 2 refills | Status: DC

## 2021-01-10 MED ORDER — ZOLPIDEM TARTRATE 10 MG PO TABS: 10.0000 mg | ORAL_TABLET | Freq: Every day | ORAL | 2 refills | Status: DC

## 2021-01-10 MED ORDER — MELOXICAM 15 MG PO TABS: 15.0000 mg | ORAL_TABLET | Freq: Every day | ORAL | 0 refills | Status: DC

## 2021-01-10 NOTE — Telephone Encounter (Signed)
Patient called requesting prescription refills of Zolpidem, Gabapentin, and mobic to be sent to [place] at [facility].

## 2021-01-10 NOTE — Telephone Encounter (Signed)
Next Visit: 03/24/2021  Last Visit: 09/19/2020  Last Fill: 10/21/2020  Dx: Fibromyalgia, Primary insomnia  Current Dose per office note on 09/19/2020: meloxicam 15 mg tablet, gabapentin 300 mg capsule 2 capsules at bedtime, Ambien 10 mg p.o. nightly   Okay to refill ambien, gabapentin and meloxicam?

## 2021-01-13 ENCOUNTER — Telehealth: Payer: Self-pay

## 2021-01-13 DIAGNOSIS — C50112 Malignant neoplasm of central portion of left female breast: Secondary | ICD-10-CM | POA: Diagnosis not present

## 2021-01-13 NOTE — Telephone Encounter (Signed)
Patient advised our protocol is to only send a 30 day supply of Ambien with the 2 refills. Patient expressed understanding.    FYI: Patient has been diagnosed with another breast cancer. Patient states she sees the surgeon tomorrow.

## 2021-01-13 NOTE — Telephone Encounter (Signed)
Patient called stating her prescription of Zolpidem was sent to the pharmacy with 30 tablets with 2 refills.  Patient states her previous prescriptions of Zolpidem are always 90 tablets so she doesn't have to call the pharmacy every month for her refill.  Patient is requesting a new prescription be sent to [place].

## 2021-01-14 DIAGNOSIS — Z803 Family history of malignant neoplasm of breast: Secondary | ICD-10-CM | POA: Diagnosis not present

## 2021-01-14 DIAGNOSIS — D0512 Intraductal carcinoma in situ of left breast: Secondary | ICD-10-CM | POA: Diagnosis not present

## 2021-01-14 DIAGNOSIS — Z853 Personal history of malignant neoplasm of breast: Secondary | ICD-10-CM | POA: Diagnosis not present

## 2021-01-15 ENCOUNTER — Other Ambulatory Visit: Payer: Self-pay | Admitting: General Surgery

## 2021-01-15 DIAGNOSIS — Z9011 Acquired absence of right breast and nipple: Secondary | ICD-10-CM | POA: Diagnosis not present

## 2021-01-15 DIAGNOSIS — Z853 Personal history of malignant neoplasm of breast: Secondary | ICD-10-CM | POA: Diagnosis not present

## 2021-01-15 DIAGNOSIS — Z9889 Other specified postprocedural states: Secondary | ICD-10-CM | POA: Diagnosis not present

## 2021-01-15 DIAGNOSIS — D0512 Intraductal carcinoma in situ of left breast: Secondary | ICD-10-CM | POA: Diagnosis not present

## 2021-01-23 NOTE — H&P (Addendum)
Subjective:   Patient ID: Michelle Gray is a 65 y.o. female.  HPI  Referred by Dr. Donne Hazel for consultation removal right breast implant at time of left mastectomy. Patient underwent right MRM in 1990 for triple negative breast ca. She completed adjuvant chemotherapy. Patient underwent immediate saline submuscular implant based reconstruction with Dr. Dessie Coma. She experienced rupture with replacement saline implant. Has been satisfied with this. No implant information.  MMG 10.2022 normal. MRI 12/2020 showed left breast 11 x 4 mm area of NME, LN normal. Biopsy showed intermediate grade DCIS ER/PR+. Patient has elected for left mastectomy, in part as she does not desire adjuvant treatments if she can avoid. Also asking for removal right implant.   Mother with breast ca.  Notes raised scars present post dermatologic procedure, believes lesions shaved. Also notes feeling cording over right chest scar with arm raised.  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 PMH significant for Raynauds (currently not active), fibromyalgia. Followed by Dr. Estanislado Pandy.  Review of Systems  Respiratory: Positive for wheezing.  Musculoskeletal: Positive for back pain.  Psychiatric/Behavioral: Positive for sleep disturbance.   Remainder 12 point review negative  Objective:  Physical Exam Cardiovascular:  Rate and Rhythm: Normal rate and regular rhythm.  Heart sounds: Normal heart sounds.  Pulmonary:  Effort: Pulmonary effort is normal.  Breath sounds: Normal breath sounds.     Chest: Right breast absent  diagonal scar, no masses, nipple reconstruction present and faded tattoo, soft Animation present Left breast no masses palpable, resolving ecchymoses Sternum two 1 cm areas hypertrophic scar   Assessment:   Hx right breast cancer s/p mastectomy, submuscular implant based reconstruction, adjuvant chemotherapy Left breast DCIS  Plan:   Appt with Dr. Lindi Adie scheduled  1.9.23  MRI reviewed- no gross evidence implant rupture. Also per patient saline.   Plan removal right implant through prior scar. Reviewed partial capsulectomy to enhance adherence soft tissue to chest wall, use of drain. Reviewed overnight stay post mastectomy and drain on left as well. Reviewed time off work. If she elects for further reconstruction, reviewed starting process over with expander. Patient requests excision for right reconstructed nipple. Counseled the tightness of scar in my opinion related to vertical nature, may recur.   Two hypertrophic scars sternum, plan excision and closure.   Rx for Norco given. Patient to hold meloxicam. Reviewed post op breast binder, drain anticipated length and target volume for removal.

## 2021-01-30 ENCOUNTER — Encounter (HOSPITAL_BASED_OUTPATIENT_CLINIC_OR_DEPARTMENT_OTHER): Payer: Self-pay | Admitting: General Surgery

## 2021-01-30 ENCOUNTER — Other Ambulatory Visit: Payer: Self-pay

## 2021-01-30 DIAGNOSIS — Z9889 Other specified postprocedural states: Secondary | ICD-10-CM | POA: Diagnosis not present

## 2021-01-30 DIAGNOSIS — L905 Scar conditions and fibrosis of skin: Secondary | ICD-10-CM | POA: Diagnosis not present

## 2021-01-30 DIAGNOSIS — Z9011 Acquired absence of right breast and nipple: Secondary | ICD-10-CM | POA: Diagnosis not present

## 2021-01-30 DIAGNOSIS — Z853 Personal history of malignant neoplasm of breast: Secondary | ICD-10-CM | POA: Diagnosis not present

## 2021-01-30 DIAGNOSIS — D0512 Intraductal carcinoma in situ of left breast: Secondary | ICD-10-CM | POA: Diagnosis not present

## 2021-01-31 NOTE — Progress Notes (Signed)
° ° ° ° ° °  Patient Instructions  The night before surgery:  No food after midnight. ONLY clear liquids after midnight  The day of surgery (if you do NOT have diabetes):  Drink ONE (1) Pre-Surgery Clear Ensure as directed.   This drink was given to you during your hospital  pre-op appointment visit. The pre-op nurse will instruct you on the time to drink the  Pre-Surgery Ensure depending on your surgery time. Finish the drink at the designated time by the pre-op nurse.  Nothing else to drink after completing the  Pre-Surgery Clear Ensure.  The day of surgery (if you have diabetes): Drink ONE (1) Gatorade 2 (G2) as directed. This drink was given to you during your hospital  pre-op appointment visit.  The pre-op nurse will instruct you on the time to drink the   Gatorade 2 (G2) depending on your surgery time. Color of the Gatorade may vary. Red is not allowed. Nothing else to drink after completing the  Gatorade 2 (G2).         If you have questions, please contact your surgeon's office.Patient was provided with CHG cleanser to use at home before the procedure. Patient verbalized understanding of instructions. 

## 2021-02-10 ENCOUNTER — Ambulatory Visit: Payer: Medicare HMO | Admitting: Hematology and Oncology

## 2021-02-11 ENCOUNTER — Encounter (HOSPITAL_BASED_OUTPATIENT_CLINIC_OR_DEPARTMENT_OTHER)
Admission: RE | Disposition: A | Discharge status: Home / Self Care | Payer: Self-pay | Source: Home / Self Care | Attending: General Surgery

## 2021-02-11 ENCOUNTER — Other Ambulatory Visit: Payer: Self-pay

## 2021-02-11 ENCOUNTER — Encounter (HOSPITAL_BASED_OUTPATIENT_CLINIC_OR_DEPARTMENT_OTHER): Payer: Self-pay | Admitting: General Surgery

## 2021-02-11 ENCOUNTER — Ambulatory Visit (HOSPITAL_BASED_OUTPATIENT_CLINIC_OR_DEPARTMENT_OTHER): Payer: Medicare Other | Admitting: Certified Registered"

## 2021-02-11 ENCOUNTER — Ambulatory Visit (HOSPITAL_BASED_OUTPATIENT_CLINIC_OR_DEPARTMENT_OTHER)
Admission: RE | Admit: 2021-02-11 | Discharge: 2021-02-12 | Disposition: A | Discharge status: Home / Self Care | Payer: Medicare Other | Attending: General Surgery | Admitting: General Surgery

## 2021-02-11 DIAGNOSIS — I7300 Raynaud's syndrome without gangrene: Secondary | ICD-10-CM | POA: Diagnosis not present

## 2021-02-11 DIAGNOSIS — Z170 Estrogen receptor positive status [ER+]: Secondary | ICD-10-CM | POA: Diagnosis not present

## 2021-02-11 DIAGNOSIS — G8918 Other acute postprocedural pain: Secondary | ICD-10-CM | POA: Diagnosis not present

## 2021-02-11 DIAGNOSIS — Z45811 Encounter for adjustment or removal of right breast implant: Secondary | ICD-10-CM | POA: Diagnosis not present

## 2021-02-11 DIAGNOSIS — Z9011 Acquired absence of right breast and nipple: Secondary | ICD-10-CM | POA: Diagnosis not present

## 2021-02-11 DIAGNOSIS — E039 Hypothyroidism, unspecified: Secondary | ICD-10-CM | POA: Diagnosis not present

## 2021-02-11 DIAGNOSIS — M1990 Unspecified osteoarthritis, unspecified site: Secondary | ICD-10-CM | POA: Insufficient documentation

## 2021-02-11 DIAGNOSIS — Z9221 Personal history of antineoplastic chemotherapy: Secondary | ICD-10-CM | POA: Diagnosis not present

## 2021-02-11 DIAGNOSIS — D0581 Other specified type of carcinoma in situ of right breast: Secondary | ICD-10-CM | POA: Diagnosis not present

## 2021-02-11 DIAGNOSIS — Z803 Family history of malignant neoplasm of breast: Secondary | ICD-10-CM | POA: Diagnosis not present

## 2021-02-11 DIAGNOSIS — C50912 Malignant neoplasm of unspecified site of left female breast: Secondary | ICD-10-CM | POA: Diagnosis present

## 2021-02-11 DIAGNOSIS — D0512 Intraductal carcinoma in situ of left breast: Secondary | ICD-10-CM | POA: Insufficient documentation

## 2021-02-11 DIAGNOSIS — Z853 Personal history of malignant neoplasm of breast: Secondary | ICD-10-CM | POA: Diagnosis not present

## 2021-02-11 DIAGNOSIS — M797 Fibromyalgia: Secondary | ICD-10-CM | POA: Diagnosis not present

## 2021-02-11 DIAGNOSIS — J45909 Unspecified asthma, uncomplicated: Secondary | ICD-10-CM | POA: Diagnosis not present

## 2021-02-11 HISTORY — PX: BREAST IMPLANT REMOVAL: SHX5361

## 2021-02-11 HISTORY — DX: Other specified postprocedural states: Z98.890

## 2021-02-11 HISTORY — PX: CAPSULECTOMY: SHX5381

## 2021-02-11 HISTORY — DX: Other specified postprocedural states: R11.2

## 2021-02-11 HISTORY — PX: TOTAL MASTECTOMY: SHX6129

## 2021-02-11 HISTORY — DX: Hypothyroidism, unspecified: E03.9

## 2021-02-11 HISTORY — DX: Fibromyalgia: M79.7

## 2021-02-11 SURGERY — MASTECTOMY, SIMPLE
Anesthesia: General | Site: Breast | Laterality: Right

## 2021-02-11 MED ORDER — MORPHINE SULFATE (PF) 4 MG/ML IV SOLN: 1.0000 mg | INTRAVENOUS | Status: DC | PRN

## 2021-02-11 MED ORDER — HYDROCODONE-ACETAMINOPHEN 5-325 MG PO TABS
1.0000 | ORAL_TABLET | ORAL | Status: DC | PRN
  Administered 2021-02-11: 1 via ORAL
  Filled 2021-02-11: qty 1

## 2021-02-11 MED ORDER — DROPERIDOL 2.5 MG/ML IJ SOLN
INTRAMUSCULAR | Status: AC
  Filled 2021-02-11: qty 2

## 2021-02-11 MED ORDER — FENTANYL CITRATE (PF) 100 MCG/2ML IJ SOLN
100.0000 ug | Freq: Once | INTRAMUSCULAR | Status: AC
  Administered 2021-02-11: 100 ug via INTRAVENOUS

## 2021-02-11 MED ORDER — ROSUVASTATIN CALCIUM 20 MG PO TABS: 20.0000 mg | ORAL_TABLET | Freq: Every day | ORAL | Status: DC

## 2021-02-11 MED ORDER — MIDAZOLAM HCL 2 MG/2ML IJ SOLN
INTRAMUSCULAR | Status: AC
  Filled 2021-02-11: qty 2

## 2021-02-11 MED ORDER — CHLORHEXIDINE GLUCONATE CLOTH 2 % EX PADS: 6.0000 | MEDICATED_PAD | Freq: Once | CUTANEOUS | Status: DC

## 2021-02-11 MED ORDER — ROPIVACAINE HCL 5 MG/ML IJ SOLN
INTRAMUSCULAR | Status: DC | PRN
  Administered 2021-02-11: 30 mL via PERINEURAL

## 2021-02-11 MED ORDER — LACTATED RINGERS IV SOLN: INTRAVENOUS | Status: DC

## 2021-02-11 MED ORDER — ENOXAPARIN SODIUM 40 MG/0.4ML IJ SOSY: 40.0000 mg | PREFILLED_SYRINGE | INTRAMUSCULAR | Status: DC

## 2021-02-11 MED ORDER — EPHEDRINE SULFATE 50 MG/ML IJ SOLN
INTRAMUSCULAR | Status: DC | PRN
  Administered 2021-02-11: 15 mg via INTRAVENOUS

## 2021-02-11 MED ORDER — ONDANSETRON HCL 4 MG/2ML IJ SOLN: 4.0000 mg | Freq: Four times a day (QID) | INTRAMUSCULAR | Status: DC | PRN

## 2021-02-11 MED ORDER — ONDANSETRON 4 MG PO TBDP: 4.0000 mg | ORAL_TABLET | Freq: Four times a day (QID) | ORAL | Status: DC | PRN

## 2021-02-11 MED ORDER — PROPOFOL 10 MG/ML IV BOLUS
INTRAVENOUS | Status: DC | PRN
  Administered 2021-02-11: 200 mg via INTRAVENOUS

## 2021-02-11 MED ORDER — BUPIVACAINE HCL (PF) 0.5 % IJ SOLN
INTRAMUSCULAR | Status: DC | PRN
  Administered 2021-02-11: 10 mL

## 2021-02-11 MED ORDER — LIDOCAINE HCL (CARDIAC) PF 100 MG/5ML IV SOSY
PREFILLED_SYRINGE | INTRAVENOUS | Status: DC | PRN
  Administered 2021-02-11: 40 mg via INTRAVENOUS

## 2021-02-11 MED ORDER — BUPIVACAINE HCL (PF) 0.5 % IJ SOLN
INTRAMUSCULAR | Status: AC
  Filled 2021-02-11: qty 30

## 2021-02-11 MED ORDER — FENTANYL CITRATE (PF) 100 MCG/2ML IJ SOLN
INTRAMUSCULAR | Status: AC
  Filled 2021-02-11: qty 2

## 2021-02-11 MED ORDER — DEXAMETHASONE SODIUM PHOSPHATE 10 MG/ML IJ SOLN
INTRAMUSCULAR | Status: DC | PRN
  Administered 2021-02-11: 5 mg via INTRAVENOUS

## 2021-02-11 MED ORDER — KCL IN DEXTROSE-NACL 20-5-0.45 MEQ/L-%-% IV SOLN
INTRAVENOUS | Status: DC
  Filled 2021-02-11: qty 1000

## 2021-02-11 MED ORDER — OXYCODONE HCL 5 MG PO TABS
5.0000 mg | ORAL_TABLET | Freq: Once | ORAL | Status: DC | PRN
Start: 2021-02-11 — End: 2021-02-11

## 2021-02-11 MED ORDER — DROPERIDOL 2.5 MG/ML IJ SOLN
INTRAMUSCULAR | Status: DC | PRN
  Administered 2021-02-11: .625 mg via INTRAVENOUS

## 2021-02-11 MED ORDER — ROCURONIUM BROMIDE 10 MG/ML (PF) SYRINGE
PREFILLED_SYRINGE | INTRAVENOUS | Status: AC
  Filled 2021-02-11: qty 10

## 2021-02-11 MED ORDER — GABAPENTIN 300 MG PO CAPS
600.0000 mg | ORAL_CAPSULE | Freq: Every day | ORAL | Status: DC
Start: 2021-02-11 — End: 2021-02-12
  Administered 2021-02-11: 600 mg via ORAL

## 2021-02-11 MED ORDER — CEFAZOLIN SODIUM-DEXTROSE 2-3 GM-%(50ML) IV SOLR
INTRAVENOUS | Status: DC | PRN
  Administered 2021-02-11: 2 g via INTRAVENOUS

## 2021-02-11 MED ORDER — DEXAMETHASONE SODIUM PHOSPHATE 10 MG/ML IJ SOLN
INTRAMUSCULAR | Status: AC
  Filled 2021-02-11: qty 1

## 2021-02-11 MED ORDER — PHENYLEPHRINE HCL (PRESSORS) 10 MG/ML IV SOLN
INTRAVENOUS | Status: DC | PRN
  Administered 2021-02-11 (×4): 120 ug via INTRAVENOUS

## 2021-02-11 MED ORDER — ONDANSETRON HCL 4 MG/2ML IJ SOLN
INTRAMUSCULAR | Status: DC | PRN
  Administered 2021-02-11: 4 mg via INTRAVENOUS

## 2021-02-11 MED ORDER — OXYCODONE HCL 5 MG/5ML PO SOLN
5.0000 mg | Freq: Once | ORAL | Status: DC | PRN
Start: 2021-02-11 — End: 2021-02-11

## 2021-02-11 MED ORDER — ALBUTEROL SULFATE HFA 108 (90 BASE) MCG/ACT IN AERS: 2.0000 | INHALATION_SPRAY | Freq: Four times a day (QID) | RESPIRATORY_TRACT | Status: DC | PRN

## 2021-02-11 MED ORDER — MAGTRACE LYMPHATIC TRACER
INTRAMUSCULAR | Status: DC | PRN
  Administered 2021-02-11: 2 mL via INTRAMUSCULAR

## 2021-02-11 MED ORDER — PROPOFOL 10 MG/ML IV BOLUS
INTRAVENOUS | Status: AC
  Filled 2021-02-11: qty 20

## 2021-02-11 MED ORDER — ONDANSETRON HCL 4 MG/2ML IJ SOLN
INTRAMUSCULAR | Status: AC
  Filled 2021-02-11: qty 2

## 2021-02-11 MED ORDER — MEPERIDINE HCL 25 MG/ML IJ SOLN: 6.2500 mg | INTRAMUSCULAR | Status: DC | PRN

## 2021-02-11 MED ORDER — METHOCARBAMOL 500 MG PO TABS
750.0000 mg | ORAL_TABLET | Freq: Every evening | ORAL | Status: DC | PRN
  Administered 2021-02-11 (×2): 750 mg via ORAL
  Filled 2021-02-11 (×2): qty 2

## 2021-02-11 MED ORDER — FENTANYL CITRATE (PF) 100 MCG/2ML IJ SOLN
INTRAMUSCULAR | Status: DC | PRN
  Administered 2021-02-11: 25 ug via INTRAVENOUS
  Administered 2021-02-11: 100 ug via INTRAVENOUS
  Administered 2021-02-11: 50 ug via INTRAVENOUS

## 2021-02-11 MED ORDER — ENSURE PRE-SURGERY PO LIQD: 296.0000 mL | Freq: Once | ORAL | Status: DC

## 2021-02-11 MED ORDER — HYDROMORPHONE HCL 1 MG/ML IJ SOLN
0.2500 mg | INTRAMUSCULAR | Status: DC | PRN
  Administered 2021-02-11: 0.5 mg via INTRAVENOUS

## 2021-02-11 MED ORDER — EZETIMIBE 10 MG PO TABS: 10.0000 mg | ORAL_TABLET | Freq: Every day | ORAL | Status: DC

## 2021-02-11 MED ORDER — CEFAZOLIN SODIUM-DEXTROSE 2-4 GM/100ML-% IV SOLN: 2.0000 g | INTRAVENOUS | Status: DC

## 2021-02-11 MED ORDER — LIDOCAINE 2% (20 MG/ML) 5 ML SYRINGE
INTRAMUSCULAR | Status: AC
  Filled 2021-02-11: qty 5

## 2021-02-11 MED ORDER — LEVOTHYROXINE SODIUM 75 MCG PO TABS: 75.0000 ug | ORAL_TABLET | ORAL | Status: DC

## 2021-02-11 MED ORDER — LEVOTHYROXINE SODIUM 50 MCG PO TABS: 50.0000 ug | ORAL_TABLET | ORAL | Status: DC

## 2021-02-11 MED ORDER — MIDAZOLAM HCL 2 MG/2ML IJ SOLN
2.0000 mg | Freq: Once | INTRAMUSCULAR | Status: AC
  Administered 2021-02-11: 2 mg via INTRAVENOUS

## 2021-02-11 MED ORDER — ACETAMINOPHEN 500 MG PO TABS
ORAL_TABLET | ORAL | Status: AC
  Filled 2021-02-11: qty 2

## 2021-02-11 MED ORDER — HYDROMORPHONE HCL 1 MG/ML IJ SOLN
INTRAMUSCULAR | Status: AC
  Filled 2021-02-11: qty 0.5

## 2021-02-11 MED ORDER — MOMETASONE FURO-FORMOTEROL FUM 200-5 MCG/ACT IN AERO
2.0000 | INHALATION_SPRAY | Freq: Two times a day (BID) | RESPIRATORY_TRACT | Status: DC
Start: 2021-02-11 — End: 2021-02-12
  Administered 2021-02-11: 2 via RESPIRATORY_TRACT

## 2021-02-11 MED ORDER — ACETAMINOPHEN 500 MG PO TABS
1000.0000 mg | ORAL_TABLET | ORAL | Status: AC
  Administered 2021-02-11: 1000 mg via ORAL

## 2021-02-11 MED ORDER — CEFAZOLIN SODIUM-DEXTROSE 2-4 GM/100ML-% IV SOLN
INTRAVENOUS | Status: AC
  Filled 2021-02-11: qty 100

## 2021-02-11 SURGICAL SUPPLY — 69 items
APPLIER CLIP 9.375 MED OPEN (MISCELLANEOUS) ×3
BAG DECANTER FOR FLEXI CONT (MISCELLANEOUS) ×2 IMPLANT
BINDER BREAST LRG (GAUZE/BANDAGES/DRESSINGS) IMPLANT
BINDER BREAST XLRG (GAUZE/BANDAGES/DRESSINGS) IMPLANT
BIOPATCH RED 1 DISK 7.0 (GAUZE/BANDAGES/DRESSINGS) ×4 IMPLANT
BLADE SURG 10 STRL SS (BLADE) ×4 IMPLANT
BLADE SURG 15 STRL LF DISP TIS (BLADE) ×2 IMPLANT
BLADE SURG 15 STRL SS (BLADE) ×3
BNDG GAUZE ELAST 4 BULKY (GAUZE/BANDAGES/DRESSINGS) ×6 IMPLANT
CANISTER SUCT 1200ML W/VALVE (MISCELLANEOUS) ×3 IMPLANT
CHLORAPREP W/TINT 26 (MISCELLANEOUS) ×4 IMPLANT
CLIP APPLIE 9.375 MED OPEN (MISCELLANEOUS) ×2 IMPLANT
COVER BACK TABLE 60X90IN (DRAPES) ×3 IMPLANT
COVER MAYO STAND STRL (DRAPES) ×3 IMPLANT
DERMABOND ADVANCED (GAUZE/BANDAGES/DRESSINGS) ×2
DERMABOND ADVANCED .7 DNX12 (GAUZE/BANDAGES/DRESSINGS) ×2 IMPLANT
DRAIN CHANNEL 15F RND FF W/TCR (WOUND CARE) ×3 IMPLANT
DRAIN CHANNEL 19F RND (DRAIN) ×3 IMPLANT
DRAPE TOP ARMCOVERS (MISCELLANEOUS) ×3 IMPLANT
DRAPE U-SHAPE 76X120 STRL (DRAPES) ×3 IMPLANT
DRAPE UTILITY XL STRL (DRAPES) ×4 IMPLANT
DRSG PAD ABDOMINAL 8X10 ST (GAUZE/BANDAGES/DRESSINGS) ×6 IMPLANT
DRSG TEGADERM 4X4.75 (GAUZE/BANDAGES/DRESSINGS) ×4 IMPLANT
ELECT BLADE 4.0 EZ CLEAN MEGAD (MISCELLANEOUS) ×3
ELECT COATED BLADE 2.86 ST (ELECTRODE) ×3 IMPLANT
ELECT REM PT RETURN 9FT ADLT (ELECTROSURGICAL) ×3
ELECTRODE BLDE 4.0 EZ CLN MEGD (MISCELLANEOUS) ×2 IMPLANT
ELECTRODE REM PT RTRN 9FT ADLT (ELECTROSURGICAL) ×2 IMPLANT
EVACUATOR SILICONE 100CC (DRAIN) ×4 IMPLANT
GAUZE SPONGE 4X4 12PLY STRL (GAUZE/BANDAGES/DRESSINGS) IMPLANT
GLOVE SURG ENC MOIS LTX SZ7 (GLOVE) ×3 IMPLANT
GLOVE SURG HYDRASOFT LTX SZ5.5 (GLOVE) ×3 IMPLANT
GLOVE SURG POLYISO LF SZ6 (GLOVE) ×1 IMPLANT
GLOVE SURG UNDER POLY LF SZ6.5 (GLOVE) ×1 IMPLANT
GLOVE SURG UNDER POLY LF SZ7 (GLOVE) ×1 IMPLANT
GLOVE SURG UNDER POLY LF SZ7.5 (GLOVE) ×3 IMPLANT
GOWN STRL REUS W/ TWL LRG LVL3 (GOWN DISPOSABLE) ×6 IMPLANT
GOWN STRL REUS W/TWL LRG LVL3 (GOWN DISPOSABLE) ×12
HEMOSTAT ARISTA ABSORB 1G (HEMOSTASIS) IMPLANT
HEMOSTAT ARISTA ABSORB 3G PWDR (HEMOSTASIS) ×1 IMPLANT
HEMOSTAT SNOW SURGICEL 2X4 (HEMOSTASIS) IMPLANT
IV NS 500ML (IV SOLUTION)
IV NS 500ML BAXH (IV SOLUTION) ×2 IMPLANT
MARKER SKIN DUAL TIP RULER LAB (MISCELLANEOUS) IMPLANT
NDL HYPO 25X1 1.5 SAFETY (NEEDLE) IMPLANT
NEEDLE HYPO 25X1 1.5 SAFETY (NEEDLE) ×6 IMPLANT
NS IRRIG 1000ML POUR BTL (IV SOLUTION) ×3 IMPLANT
PACK BASIN DAY SURGERY FS (CUSTOM PROCEDURE TRAY) ×3 IMPLANT
PENCIL SMOKE EVACUATOR (MISCELLANEOUS) ×3 IMPLANT
PIN SAFETY STERILE (MISCELLANEOUS) ×3 IMPLANT
SHEET MEDIUM DRAPE 40X70 STRL (DRAPES) ×6 IMPLANT
SLEEVE SCD COMPRESS KNEE MED (STOCKING) ×3 IMPLANT
SPONGE T-LAP 18X18 ~~LOC~~+RFID (SPONGE) ×7 IMPLANT
STAPLER VISISTAT 35W (STAPLE) ×3 IMPLANT
STRIP CLOSURE SKIN 1/2X4 (GAUZE/BANDAGES/DRESSINGS) ×5 IMPLANT
SUT ETHILON 2 0 FS 18 (SUTURE) ×4 IMPLANT
SUT MNCRL AB 4-0 PS2 18 (SUTURE) ×4 IMPLANT
SUT PDS 3-0 CT2 (SUTURE)
SUT PDS II 3-0 CT2 27 ABS (SUTURE) IMPLANT
SUT SILK 2 0 SH (SUTURE) ×3 IMPLANT
SUT VICRYL 3-0 CR8 SH (SUTURE) ×4 IMPLANT
SUT VLOC 180 0 24IN GS25 (SUTURE) ×1 IMPLANT
SYR BULB IRRIG 60ML STRL (SYRINGE) ×3 IMPLANT
SYR CONTROL 10ML LL (SYRINGE) ×1 IMPLANT
TOWEL GREEN STERILE FF (TOWEL DISPOSABLE) ×6 IMPLANT
TRACER MAGTRACE VIAL (MISCELLANEOUS) ×1 IMPLANT
TUBE CONNECTING 20X1/4 (TUBING) ×3 IMPLANT
UNDERPAD 30X36 HEAVY ABSORB (UNDERPADS AND DIAPERS) ×6 IMPLANT
YANKAUER SUCT BULB TIP NO VENT (SUCTIONS) ×3 IMPLANT

## 2021-02-11 NOTE — Discharge Instructions (Addendum)
[facility], [place] [phone]  MASTECTOMY: POST OP INSTRUCTIONS Take 400 mg of ibuprofen every 8 hours or 650 mg tylenol every 6 hours for next 72 hours then as needed. Use ice several times daily also. Always review your discharge instruction sheet given to you by the facility where your surgery was performed. IF YOU HAVE DISABILITY OR FAMILY LEAVE FORMS, YOU MUST BRING THEM TO THE OFFICE FOR PROCESSING.   DO NOT GIVE THEM TO YOUR DOCTOR. A prescription for pain medication may be given to you upon discharge.  Take your pain medication as prescribed, if needed.  If narcotic pain medicine is not needed, then you may take acetaminophen (Tylenol), naprosyn (Alleve) or ibuprofen (Advil) as needed. Take your usually prescribed medications unless otherwise directed. If you need a refill on your pain medication, please contact your pharmacy.  They will contact our office to request authorization.  Prescriptions will not be filled after 5pm or on week-ends. You should follow a light diet the first few days after arrival home, such as soup and crackers, etc.  Resume your normal diet the day after surgery. Most patients will experience some swelling and bruising on the chest and underarm.  Ice packs will help.  Swelling and bruising can take several days to resolve. Wear the binder day and night until you return to the office.  It is common to experience some constipation if taking pain medication after surgery.  Increasing fluid intake and taking a stool softener (such as Colace) will usually help or prevent this problem from occurring.  A mild laxative (Milk of Magnesia or Miralax) should be taken according to package instructions if there are no bowel movements after 48 hours. Unless discharge instructions indicate otherwise, leave your bandage dry and in place until your next appointment in 3-5 days.  You may take a limited sponge bath.  No tube baths or showers until the drains are removed.   You may have steri-strips (small skin tapes) in place directly over the incision.  These strips should be left on the skin for 7-10 days. If you have glue it will come off in next couple week.  Any sutures will be removed at an office visit DRAINS:  If you have drains in place, it is important to keep a list of the amount of drainage produced each day in your drains.  Before leaving the hospital, you should be instructed on drain care.  Call our office if you have any questions about your drains. I will remove your drains when they put out less than 30 cc or ml for 2 consecutive days. ACTIVITIES:  You may resume regular (light) daily activities beginning the next day--such as daily self-care, walking, climbing stairs--gradually increasing activities as tolerated.  You may have sexual intercourse when it is comfortable.  Refrain from any heavy lifting or straining until approved by your doctor. You may drive when you are no longer taking prescription pain medication, you can comfortably wear a seatbelt, and you can safely maneuver your car and apply brakes. RETURN TO WORK:  __________________________________________________________ [name] should see your doctor in the office for a follow-up appointment approximately 3-5 days after your surgery.  Your doctors nurse will typically make your follow-up appointment when she calls you with your pathology report.  Expect your pathology report 3-4business days after surgery. OTHER INSTRUCTIONS: ______________________________________________________________________________________________ ____________________________________________________________________________________________ WHEN TO CALL YOUR DR [place]: Fever over 101.0 Nausea and/or vomiting Extreme swelling or bruising Continued bleeding from incision. Increased pain, redness,  or drainage from the incision. The clinic staff is available to answer your questions during regular business hours.  Please dont  hesitate to call and ask to speak to one of the nurses for clinical concerns.  If you have a medical emergency, go to the nearest emergency room or call 911.  A surgeon from [facility] is always on call at the hospital. [address], [place], [place], [place]  [address] ? [address], [place], [place]   [address] [phone] ? [phone] ? FAX [phone] Web site: [email or web address]   About my Jackson-Pratt Bulb Drain  What is a Jackson-Pratt bulb? A Jackson-Pratt is a soft, round device used to collect drainage. It is connected to a long, thin drainage catheter, which is held in place by one or two small stiches near your surgical incision site. When the bulb is squeezed, it forms a vacuum, forcing the drainage to empty into the bulb.  Emptying the Jackson-Pratt bulb- To empty the bulb: 1. Release the plug on the top of the bulb. 2. Pour the bulb's contents into a measuring container which your nurse will provide. 3. Record the time emptied and amount of drainage. Empty the drain(s) as often as your     doctor or nurse recommends.  Date                  Time                    Amount (Drain 1)                 Amount (Drain 2)  __________________________________________________________________________________  __________________________________________________________________________________  __________________________________________________________________________________  __________________________________________________________________________________  __________________________________________________________________________________  __________________________________________________________________________________  __________________________________________________________________________________  __________________________________________________________________________________  Squeezing the Jackson-Pratt Bulb- To squeeze the  bulb: 1. Make sure the plug at the top of the bulb is open. 2. Squeeze the bulb tightly in your fist. You will hear air squeezing from the bulb. 3. Replace the plug while the bulb is squeezed. 4. Use a safety pin to attach the bulb to your clothing. This will keep the catheter from     pulling at the bulb insertion site.  When to call your doctor- Call your doctor if: Drain site becomes red, swollen or hot. You have a fever greater than 101 degrees F. There is oozing at the drain site. Drain falls out (apply a guaze bandage over the drain hole and secure it with tape). Drainage increases daily not related to activity patterns. (You will usually have more drainage when you are active than when you are resting.) Drainage has a bad odor.

## 2021-02-11 NOTE — Interval H&P Note (Signed)
History and Physical Interval Note:  02/11/2021 8:27 AM  Michelle Gray  has presented today for surgery, with the diagnosis of LEFT BREAST DCIS.  The various methods of treatment have been discussed with the patient and family. After consideration of risks, benefits and other options for treatment, the patient has consented to  Procedure(s): LEFT TOTAL MASTECTOMY (Left) REMOVAL BREAST IMPLANTS (Right) CAPSULECTOMY (Right) as a surgical intervention.  The patient's history has been reviewed, patient examined, no change in status, stable for surgery.  I have reviewed the patient's chart and labs.  Questions were answered to the patient's satisfaction.     Arnoldo Hooker Achol Azpeitia

## 2021-02-11 NOTE — Anesthesia Procedure Notes (Addendum)
Procedure Name: LMA Insertion Date/Time: 02/11/2021 9:45 AM Performed by: Verita Lamb, CRNA Pre-anesthesia Checklist: Patient identified, Emergency Drugs available, Suction available and Patient being monitored Patient Re-evaluated:Patient Re-evaluated prior to induction Oxygen Delivery Method: Circle system utilized Preoxygenation: Pre-oxygenation with 100% oxygen Induction Type: IV induction Ventilation: Mask ventilation without difficulty LMA: LMA inserted LMA Size: 4.0 Number of attempts: 1 Airway Equipment and Method: Bite block Placement Confirmation: positive ETCO2, CO2 detector and breath sounds checked- equal and bilateral Tube secured with: Tape Dental Injury: Teeth and Oropharynx as per pre-operative assessment

## 2021-02-11 NOTE — Op Note (Signed)
Preoperative diagnosis: Prior right modified radical mastectomy 1990 for right breast cancer, left breast ductal carcinoma in situ Postoperative diagnosis: Same as above Procedure: 1.  Left mastectomy 2.  Injection of mag trace for possible delayed sentinel lymph node biopsy Surgeon: Dr. Serita Grammes Anesthesia: General with a pectoral block Estimated blood loss: Minimal Complications: None Drains: 19 French Blake drain placed at time of surgery Specimens: Left breast tissue marked short superior, long lateral Sponge needle count was correct at completion Disposition to recovery stable condition  Indications: This is a 66 year old female who has a history 1990 of a right modified radical mastectomy with immediate reconstruction for a triple negative cancer.  She has done well from this.  She has a significant family history of breast cancer.  She has been followed by imaging.  She had a normal mammogram in October but then had an MRI that showed a C density breast with an 11 x 4 mm area of non-mass enhancement.  She underwent a biopsy that shows intermediate grade DCIS that is under percent ER positive and 45% PR positive.  We discussed all of her options and she elected to proceed with a mastectomy.  She also desired her right implant to be removed I asked plastic surgery to participate in the operation as well as so this was done as a combined procedure.  Procedure: After informed consent was obtained I first injected mag trace for possible delayed sentinel lymph node biopsy.  We did a timeout and then I prepped the area.  I injected 2 cc of mag trace in the subareolar position.  She then had a pectoral block completed.  She was given antibiotics.  She was then taken to the operating room and placed under general anesthesia without complication.  She had SCDs in place.  She was prepped and draped in a standard sterile surgical fashion.  A surgical timeout was then performed.  I elected to make  a transverse incision to do this.  I made a transverse incision which encompass old scars.  I then created flaps to the clavicle, parasternal region, inframammary fold, and the lateral border of the breast.  I then remove the breast tissue and the fascia from the muscle.  This was marked as above and passed off the table.  Hemostasis was observed.  I did listen and there was magtrace activity in her axilla.  I then placed a 32 [place] Blake drain and secured this with a 2-0 nylon suture.  I then closed this with 3-0 Vicryl and 4-0 Monocryl.  Glue and Steri-Strips were applied.  Plastic surgery was completed their portion of the operation after this.

## 2021-02-11 NOTE — Anesthesia Preprocedure Evaluation (Signed)
Anesthesia Evaluation  Patient identified by MRN, date of birth, ID band Patient awake    Reviewed: Allergy & Precautions, NPO status , Patient's Chart, lab work & pertinent test results  History of Anesthesia Complications (+) PONV  Airway Mallampati: II  TM Distance: >3 FB Neck ROM: Full    Dental no notable dental hx.    Pulmonary asthma ,    Pulmonary exam normal breath sounds clear to auscultation       Cardiovascular negative cardio ROS Normal cardiovascular exam Rhythm:Regular Rate:Normal     Neuro/Psych negative neurological ROS  negative psych ROS   GI/Hepatic negative GI ROS, Neg liver ROS,   Endo/Other  Hypothyroidism   Renal/GU negative Renal ROS  negative genitourinary   Musculoskeletal  (+) Arthritis , Osteoarthritis,  Fibromyalgia -  Abdominal   Peds negative pediatric ROS (+)  Hematology negative hematology ROS (+)   Anesthesia Other Findings   Reproductive/Obstetrics negative OB ROS                             Anesthesia Physical Anesthesia Plan  ASA: 2  Anesthesia Plan: General   Post-op Pain Management: Regional block   Induction: Intravenous  PONV Risk Score and Plan: 4 or greater and Ondansetron, Dexamethasone, Midazolam, Droperidol and Treatment may vary due to age or medical condition  Airway Management Planned: LMA and Oral ETT  Additional Equipment:   Intra-op Plan:   Post-operative Plan: Extubation in OR  Informed Consent: I have reviewed the patients History and Physical, chart, labs and discussed the procedure including the risks, benefits and alternatives for the proposed anesthesia with the patient or authorized representative who has indicated his/her understanding and acceptance.     Dental advisory given  Plan Discussed with: CRNA  Anesthesia Plan Comments:         Anesthesia Quick Evaluation

## 2021-02-11 NOTE — Transfer of Care (Signed)
Immediate Anesthesia Transfer of Care Note  Patient: Michelle Gray  Procedure(s) Performed: LEFT TOTAL MASTECTOMY (Left: Breast) REMOVAL RIGHT BREAST IMPLANT (Right: Breast) CAPSULECTOMY (Right: Breast)  Patient Location: PACU  Anesthesia Type:GA combined with regional for post-op pain  Level of Consciousness: awake and alert   Airway & Oxygen Therapy: Patient Spontanous Breathing and Patient connected to face mask oxygen  Post-op Assessment: Report given to RN and Post -op Vital signs reviewed and stable  Post vital signs: Reviewed and stable  Last Vitals:  Vitals Value Taken Time  BP    Temp    Pulse 81 02/11/21 1115  Resp 12 02/11/21 1115  SpO2 100 % 02/11/21 1115  Vitals shown include unvalidated device data.  Last Pain:  Vitals:   02/11/21 0756  TempSrc: Oral  PainSc: 0-No pain      Patients Stated Pain Goal: 3 (37/85/88 5027)  Complications: No notable events documented.

## 2021-02-11 NOTE — Anesthesia Postprocedure Evaluation (Signed)
Anesthesia Post Note  Patient: Michelle Gray  Procedure(s) Performed: LEFT TOTAL MASTECTOMY (Left: Breast) REMOVAL RIGHT BREAST IMPLANT (Right: Breast) CAPSULECTOMY (Right: Breast)     Patient location during evaluation: PACU Anesthesia Type: General Level of consciousness: awake and alert Pain management: pain level controlled Vital Signs Assessment: post-procedure vital signs reviewed and stable Respiratory status: spontaneous breathing, nonlabored ventilation and respiratory function stable Cardiovascular status: blood pressure returned to baseline and stable Postop Assessment: no apparent nausea or vomiting Anesthetic complications: no   No notable events documented.  Last Vitals:  Vitals:   02/11/21 1130 02/11/21 1145  BP: 114/66 117/67  Pulse: 83 80  Resp: 18 15  Temp:    SpO2: 94% 96%    Last Pain:  Vitals:   02/11/21 1145  TempSrc:   PainSc: 2                  Lynda Rainwater

## 2021-02-11 NOTE — Progress Notes (Signed)
Assisted Dr. Miller with left, ultrasound guided, pectoralis block. Side rails up, monitors on throughout procedure. See vital signs in flow sheet. Tolerated Procedure well. 

## 2021-02-11 NOTE — Interval H&P Note (Signed)
History and Physical Interval Note:  02/11/2021 9:15 AM  Michelle Gray  has presented today for surgery, with the diagnosis of LEFT BREAST DCIS.  The various methods of treatment have been discussed with the patient and family. After consideration of risks, benefits and other options for treatment, the patient has consented to  Procedure(s): LEFT TOTAL MASTECTOMY (Left) REMOVAL BREAST IMPLANTS (Right) CAPSULECTOMY (Right) as a surgical intervention.  The patient's history has been reviewed, patient examined, no change in status, stable for surgery.  I have reviewed the patient's chart and labs.  Questions were answered to the patient's satisfaction.     Rolm Bookbinder

## 2021-02-11 NOTE — Anesthesia Procedure Notes (Signed)
Anesthesia Regional Block: Pectoralis block   Pre-Anesthetic Checklist: , timeout performed,  Correct Patient, Correct Site, Correct Laterality,  Correct Procedure, Correct Position, site marked,  Risks and benefits discussed,  Surgical consent,  Pre-op evaluation,  At surgeon's request and post-op pain management  Laterality: Left  Prep: chloraprep       Needles:  Injection technique: Single-shot  Needle Type: Stimiplex     Needle Length: 9cm  Needle Gauge: 21     Additional Needles:   Procedures:,,,, ultrasound used (permanent image in chart),,    Narrative:  Start time: 02/11/2021 9:20 AM End time: 02/11/2021 9:25 AM Injection made incrementally with aspirations every 5 mL.  Performed by: Personally  Anesthesiologist: Lynda Rainwater, MD

## 2021-02-11 NOTE — H&P (Signed)
66 year old female I know well. She has a history in 1990 of having a right modified radical mastectomy with immediate reconstruction. This is for a triple negative cancer. She was then treated with chemotherapy after this. She has done well and has had no recurrence of this since then. She is otherwise fairly healthy. She has a significant family history of breast cancer. She had no mass or discharge and is being followed by imaging. She has a normal mammogram in October. She underwent an MRI that showed C density breast. This showed she was status post a right mastectomy with an implant. The left side had an 11 x 4 mm area of non-mass enhancement. All of her lymph nodes were normal. She underwent a biopsy that shows intermediate grade DCIS that is 100% ER positive and 45% ER positive. She is here today to discuss her options.  Review of Systems: A complete review of systems was obtained from the patient. I have reviewed this information and discussed as appropriate with the patient. See HPI as well for other ROS.  Review of Systems  All other systems reviewed and are negative.   Medical History: Past Medical History:  Diagnosis Date   Arthritis   Asthma, unspecified asthma severity, unspecified whether complicated, unspecified whether persistent   History of cancer   Hyperlipidemia   Thyroid disease   Patient Active Problem List  Diagnosis   Atherosclerotic heart disease of native coronary artery without angina pectoris   Fibromyalgia   Past Surgical History:  Procedure Laterality Date   APPENDECTOMY   foot surgery N/A   MASTECTOMY MODIFIED RADICAL Right    Allergies  Allergen Reactions   Prochlorperazine Unknown   Current Outpatient Medications on File Prior to Visit  Medication Sig Dispense Refill   budesonide-formoteroL (SYMBICORT) 160-4.5 mcg/actuation inhaler Symbicort 160 mcg-4.5 mcg/actuation HFA aerosol inhaler INHALE 2 PUFFS BY MOUTH IN THE MORNING AND THEN ANOTHER 2  PUFFS ABOUT 12 HOURS LATER   gabapentin (NEURONTIN) 300 MG capsule gabapentin 300 mg capsule TAKE 2 CAPSULES BY MOUTH DAILY AT BEDTIME   meloxicam (MOBIC) 15 MG tablet meloxicam 15 mg tablet   rosuvastatin (CRESTOR) 20 MG tablet rosuvastatin 20 mg tablet TAKE 1 TABLET BY MOUTH DAILY   zolpidem (AMBIEN) 10 mg tablet zolpidem 10 mg tablet   ezetimibe (ZETIA) 10 mg tablet   glucosamine HCl 500 mg Tab Take by mouth   levothyroxine (SYNTHROID) 50 MCG tablet levothyroxine 50 mcg tablet TAKE 1 TABLET BY MOUTH 3 DAYS WEEKLY ON MONDAY, WEDNESDAY, FRIDAY AND 1 AND 1/2 TABLETS ON ALL OTHER DAYS   Family History  Problem Relation Age of Onset   Hyperlipidemia (Elevated cholesterol) Mother   Breast cancer Mother   Stroke Father   Hyperlipidemia (Elevated cholesterol) Father    Social History   Tobacco Use  Smoking Status Never  Smokeless Tobacco Never    Social History   Socioeconomic History   Marital status: Married  Tobacco Use   Smoking status: Never   Smokeless tobacco: Never  Vaping Use   Vaping Use: Never used  Substance and Sexual Activity   Alcohol use: Yes   Drug use: Never   Objective:   Vitals:  01/14/21 1031  BP: 132/86  Pulse: 102  Temp: 36.9 C (98.4 F)  SpO2: 97%  Weight: 71.6 kg (157 lb 12.8 oz)  Height: 170.2 cm (5\' 7" )   Body mass index is 24.71 kg/m.  Physical Exam Constitutional:  Appearance: Normal appearance.  Chest:  Breasts: Right:  No mass.  Left: No inverted nipple, mass, nipple discharge or skin change.  Lymphadenopathy:  Upper Body:  Right upper body: No supraclavicular or axillary adenopathy.  Left upper body: No supraclavicular or axillary adenopathy.  Neurological:  Mental Status: She is alert.     Assessment and Plan:   Ductal carcinoma in situ (DCIS) of left breast - Ambulatory referral to Plastic Surgery   Left mastectomy without reconstruction, injection of mag trace at the time of mastectomy for possible delayed  sentinel lymph node identification, plastic surgery appointment to remove the right-sided implant  We discussed the staging and pathophysiology of breast cancer. We discussed all of the different options for treatment for breast cancer including surgery, chemotherapy, radiation therapy, Herceptin, and antiestrogen therapy. She does not need a sentinel lymph node biopsy now. I will inject mag trace at the time of the operation just in case there is a need for a delayed sentinel lymph node biopsy. We discussed the options for treatment of the breast cancer which included lumpectomy versus a mastectomy. We discussed the performance of the lumpectomy with radioactive seed placement. We discussed a 5-10% chance of a positive margin requiring reexcision in the operating room. We also discussed that she will likely need radiation therapy if she undergoes lumpectomy. We discussed mastectomy and the postoperative care for that as well. Mastectomy can be followed by reconstruction. The decision for lumpectomy vs mastectomy has no impact on decision for chemotherapy. Most mastectomy patients will not need radiation therapy. We discussed that there is no difference in her survival whether she undergoes lumpectomy with radiation therapy or antiestrogen therapy versus a mastectomy. There is also no real difference between her recurrence in the breast. She would like to proceed with a mastectomy on the left without any reconstruction. She would also like her implant removed on the right. I think I will get plastics involved for the right side to do a combination procedure. We discussed the recovery as well as the surgery today. When she sees plastic we will work on scheduling her. We discussed the risks of operation including bleeding, infection, possible reoperation. She understands her further therapy will be based on what her stages at the time of her operation.

## 2021-02-11 NOTE — Op Note (Signed)
Operative Note   DATE OF OPERATION: 1.10.2023  LOCATION: [facility]-observation  SURGICAL DIVISION: Plastic Surgery  PREOPERATIVE DIAGNOSES:  1. Left breast DCIS 2. Acquired absence right breast 3. History breast reconstruction  POSTOPERATIVE DIAGNOSES:  same  PROCEDURE:  1. Removal right chest breast implant, partial capsulectomy 2. Complex repair chest total 2 cm  SURGEON: Irene Limbo MD MBA  ASSISTANT: none  ANESTHESIA:  General.   EBL: 30 ml for entire procedure  COMPLICATIONS: None immediate.   INDICATIONS FOR PROCEDURE:  The patient, Michelle Gray, is a 66 y.o. female born on 05-28-1955, is here for removal right breast reconstruction at time of left mastectomy.   FINDINGS: Removed intact smooth round saline implant. Very thin capsule noted.  DESCRIPTION OF PROCEDURE:  The patient's operative site was marked with the patient in the preoperative area. The patient was taken to the operating room. SCDs were placed and IV antibiotics were given. The patient's operative site was prepped and draped in a sterile fashion. A time out was performed and all information was confirmed to be correct. Following completion left mastectomy, I began on right side. Elliptical excision reconstructed nipple areola complex completed. Capsule entered and implant removed. Partial anterior capsule excised to aid with adherence. Skin flaps elevated off pectoralis muscle in limited fashion. Pectoralis muscle secured to chest wall with 0 V lock suture. 15 Fr JP placed and secured with 2-0 nylon. Incision closed with 3-0 vicryl in dermis and superficial fascia followed by 4-0 monocryl subcuticular skin closure.   Over sternum, two scars excised. Each closed in layers with 4-0 monocryl in dermis and 4-0 monocryl subcuticular skin closure, each 1 cm in length. Dermabond followed by steri strips applied to all incisions.  The patient was allowed to wake from anesthesia, extubated and taken to  the recovery room in satisfactory condition.   SPECIMENS: Right chest capsule  DRAINS: 15 Fr JP in right chest

## 2021-02-12 ENCOUNTER — Encounter (HOSPITAL_BASED_OUTPATIENT_CLINIC_OR_DEPARTMENT_OTHER): Payer: Self-pay | Admitting: General Surgery

## 2021-02-12 NOTE — Discharge Summary (Signed)
Physician Discharge Summary  Patient ID: Michelle Gray MRN: [id number] DOB/AGE: 1955-05-29 66 y.o.  Admit date: 02/11/2021 Discharge date: 02/12/2021  Admission Diagnoses: Left breast dcis  Discharge Diagnoses:  Principal Problem:   Breast cancer, left breast [facility])   Discharged Condition: good  Hospital Course: 65 yof with prior right breast cancer s/p right mrm.  Was followed and found to have left breast dcis.  Underwent right capsulectomy and implant removal as well as left mastectomy.  Doing well following am  Consults: None  Significant Diagnostic Studies: none  Treatments: surgery: left mastectomy, right implant removal and capsulectomy  Discharge Exam: Blood pressure 102/63, pulse 71, temperature 97.9 F (36.6 C), resp. rate 16, height 5\' 7"  (1.702 m), weight 70.8 kg, SpO2 99 %. No hematoma bilaterally, flaps viable, drains serosang  Disposition: Discharge disposition: 01-Home or Self Care        Allergies as of 02/12/2021       Reactions   Compazine    Tape Rash   Allergy to adhesive per patient.         Medication List     TAKE these medications    5-HTP Caps Take 2 tablets by mouth at bedtime.   albuterol 108 (90 Base) MCG/ACT inhaler Commonly known as: VENTOLIN HFA Inhale 2 puffs into the lungs every 6 (six) hours as needed for wheezing or shortness of breath.   Biotin 5000 MCG Caps Take 5,000 mcg by mouth daily.   CoQ-10 100 MG Caps   ezetimibe 10 MG tablet Commonly known as: ZETIA Take 1 tablet (10 mg total) by mouth daily.   gabapentin 300 MG capsule Commonly known as: NEURONTIN TAKE TWO CAPSULES BY MOUTH DAILY AT BEDTIME   Glucosamine 500 MG Tabs Take 1,500 mg by mouth daily.   levothyroxine 50 MCG tablet Commonly known as: SYNTHROID Take 50 mcg by mouth every other day.   levothyroxine 75 MCG tablet Commonly known as: SYNTHROID Take 75 mcg by mouth every other day.   Magnesium 500 MG Caps Take 500 mg by mouth  daily.   meloxicam 15 MG tablet Commonly known as: MOBIC Take 1 tablet (15 mg total) by mouth daily.   methocarbamol 750 MG tablet Commonly known as: ROBAXIN Take 1 tablet (750 mg total) by mouth at bedtime as needed for muscle spasms.   rosuvastatin 20 MG tablet Commonly known as: CRESTOR Take 1 tablet (20 mg total) by mouth daily.   Symbicort 160-4.5 MCG/ACT inhaler Generic drug: budesonide-formoterol Take 2 puffs first thing in am and then another 2 puffs about 12 hours later.   TART CHERRY ADVANCED PO Take by mouth.   TURMERIC PO Take 1 tablet by mouth daily.   Vitamin D3 125 MCG (5000 UT) Caps Take 10,000 Units by mouth once a week.   zolpidem 10 MG tablet Commonly known as: AMBIEN Take 1 tablet (10 mg total) by mouth at bedtime.        Follow-up Information     Irene Limbo, MD Follow up in 1 week(s).   Specialty: Plastic Surgery Why: as scheduled Contact information: [address] [place] [address] [phone]         Rolm Bookbinder, MD Follow up in 3 week(s).   Specialty: General Surgery Contact information: [address] [place] [address] [phone]                 Signed: Rolm Bookbinder 02/12/2021, 8:16 AM

## 2021-02-13 NOTE — Addendum Note (Signed)
Addendum  created 02/13/21 0738 by Tawni Millers, CRNA   Charge Capture section accepted

## 2021-02-13 NOTE — Addendum Note (Signed)
Addendum  created 02/13/21 0703 by Verita Lamb, CRNA   Charge Capture section accepted

## 2021-02-19 ENCOUNTER — Encounter: Payer: Self-pay | Admitting: *Deleted

## 2021-02-20 ENCOUNTER — Encounter: Payer: Self-pay | Admitting: *Deleted

## 2021-02-24 LAB — SURGICAL PATHOLOGY

## 2021-03-14 NOTE — Progress Notes (Signed)
Office Visit Note  Patient: Michelle Gray             Date of Birth: 04/14/1955           MRN: [id number]             PCP: Cari Caraway, MD Referring: Cari Caraway, MD Visit Date: 03/25/2021 Occupation: @GUAROCC @  Subjective:  Muscle pain  History of Present Illness: Michelle Gray is a 66 y.o. female with history of fibromyalgia, osteoarthritis, and DDD.  Patient reports that she underwent a left total mastectomy on 02/11/2021 performed by Dr. Donne Hazel after being diagnosed with ductal carcinoma in situ of the left breast.  Patient reports that she had to hold her medications and over-the-counter products for about 2 weeks around the time of surgery.  She was experiencing increased joint pain during that time.  She has since restarted her prescription medications and over-the-counter supplements.  During the postoperative period methocarbamol was helpful in managing most of her symptoms.  She requested a refill of methocarbamol today to alleviate her myalgias, muscle tenderness, and intermittent muscle spasms.  She remains on gabapentin 300 mg 2 capsules at bedtime, Ambien 10 mg at bedtime, and meloxicam 15 mg 1 tablet daily as needed for pain relief.  Overall since being out of work for surgery she has been under less stress and has been sleeping better at night.  Her fibromyalgia has been better controlled recently.  She is planning on returning to work for 2 days a week starting in March.  She plans on trying to increase her activity level as well as work on weight loss in the next coming months.   Activities of Daily Living:  Patient reports morning stiffness for 0 minutes.   Patient Reports nocturnal pain.  Difficulty dressing/grooming: Denies Difficulty climbing stairs: Denies Difficulty getting out of chair: Denies Difficulty using hands for taps, buttons, cutlery, and/or writing: Reports  Review of Systems  Constitutional:  Positive for fatigue.  HENT:  Positive for  mouth dryness and nose dryness. Negative for mouth sores.   Eyes:  Positive for dryness. Negative for pain and itching.  Respiratory:  Negative for shortness of breath and difficulty breathing.   Cardiovascular:  Negative for chest pain and palpitations.  Gastrointestinal:  Negative for blood in stool, constipation and diarrhea.  Endocrine: Negative for increased urination.  Genitourinary:  Negative for difficulty urinating.  Musculoskeletal:  Positive for joint pain, joint pain, myalgias, muscle tenderness and myalgias. Negative for joint swelling and morning stiffness.  Skin:  Negative for color change, rash and redness.  Allergic/Immunologic: Negative for susceptible to infections.  Neurological:  Negative for dizziness, numbness, headaches, memory loss and weakness.  Hematological:  Positive for bruising/bleeding tendency.  Psychiatric/Behavioral:  Negative for confusion.    PMFS History:  Patient Active Problem List   Diagnosis Date Noted   Breast cancer, left breast ([place]) 02/11/2021   Cough variant asthma with ? component upper airway cough syndrome 12/23/2018   Primary osteoarthritis of both hands 02/21/2016   Fibromyalgia 02/21/2016   History of hypothyroidism 02/21/2016   Dyslipidemia 02/21/2016   History of breast cancer 02/21/2016   Age-related osteoporosis without current pathological fracture 02/21/2016   Unspecified hypothyroidism 08/06/2013   Other and unspecified hyperlipidemia 08/06/2013   Insomnia 08/06/2013   Intrinsic asthma 08/06/2013   Hip pain, left 01/12/2011   Plantar fasciitis 01/12/2011    Past Medical History:  Diagnosis Date   Arthritis    Asthma  Cancer [facility]) 1990   right breast ca-mast with reconstr   Cancer [facility]) 12/2020   left breast DCIS   Fibromyalgia    Hyperlipidemia    Hypothyroidism    Neuromuscular disorder ([place])    Osteoporosis    PONV (postoperative nausea and vomiting)    Thyroid disease     Family History  Problem Relation  Age of Onset   Cancer Mother    Hyperlipidemia Mother    Hypertension Mother    Stroke Mother    Dementia Mother    Hyperlipidemia Father    Stroke Father    Prostate cancer Father    Cancer Father 65       Prostate   Hypertension Brother    Past Surgical History:  Procedure Laterality Date   APPENDECTOMY     BREAST IMPLANT REMOVAL Right 02/11/2021   Procedure: REMOVAL RIGHT BREAST IMPLANT;  Surgeon: Irene Limbo, MD;  Location: [facility];  Service: Plastics;  Laterality: Right;   BREAST SURGERY Right 1990   Mastectomy   CAPSULECTOMY Right 02/11/2021   Procedure: CAPSULECTOMY;  Surgeon: Irene Limbo, MD;  Location: [facility];  Service: Plastics;  Laterality: Right;   FOOT SURGERY     LAPROSCOPIC     TOTAL MASTECTOMY Left 02/11/2021   Procedure: LEFT TOTAL MASTECTOMY;  Surgeon: Rolm Bookbinder, MD;  Location: [place];  Service: General;  Laterality: Left;   [place] History   Social History Narrative   Not on file   Immunization History  Administered Date(s) Administered   Influenza-Unspecified 10/04/2018   PFIZER(Purple Top)SARS-COV-2 Vaccination 01/31/2019, 02/21/2019, 11/04/2019, 05/18/2020     Objective: Vital Signs: BP 127/74 (BP Location: Left Arm, Patient Position: Sitting, Cuff Size: Normal)    Pulse 75    Ht 5' 7.25" (1.708 m)    Wt 160 lb 6.4 oz (72.8 kg)    BMI 24.94 kg/m    Physical Exam Vitals and nursing note reviewed.  Constitutional:      Appearance: She is well-developed.  HENT:     Head: Normocephalic and atraumatic.  Eyes:     Conjunctiva/sclera: Conjunctivae normal.  Cardiovascular:     Rate and Rhythm: Normal rate and regular rhythm.     Heart sounds: Normal heart sounds.  Pulmonary:     Effort: Pulmonary effort is normal.     Breath sounds: Normal breath sounds.  Abdominal:     General: Bowel sounds are normal.     Palpations: Abdomen is soft.   Musculoskeletal:     Cervical back: Normal range of motion.  Lymphadenopathy:     Cervical: No cervical adenopathy.  Skin:    General: Skin is warm and dry.     Capillary Refill: Capillary refill takes less than 2 seconds.  Neurological:     Mental Status: She is alert and oriented to person, place, and time.  Psychiatric:        Behavior: Behavior normal.     Musculoskeletal Exam: C-spine, thoracic spine, and lumbar spine have good range of motion.  Some tenderness over both SI joints.  No midline spinal tenderness.  Some trapezius muscle tension and tenderness bilaterally.  Shoulder joints, elbow joints, wrist joints, MCPs, PIPs, DIPs have good range of motion with no synovitis.  She was able to make a complete fist bilaterally.  Mild DIP prominence consistent with osteoarthritis of both hands.  Hip joints have good range of motion with no groin  pain.  Knee joints have good range of motion with no warmth or effusion.  Ankle joints have good range of motion with no tenderness or joint swelling.  Some tenderness along the plantar fascia of both feet.  No tenderness over MTP joints.  Tenderness over the left trochanteric bursa noted.  CDAI Exam: CDAI Score: -- Patient Global: --; Provider Global: -- Swollen: --; Tender: -- Joint Exam 03/25/2021   No joint exam has been documented for this visit   There is currently no information documented on the homunculus. Go to the Rheumatology activity and complete the homunculus joint exam.  Investigation: No additional findings.  Imaging: No results found.  Recent Labs: Lab Results  Component Value Date   WBC 16.8 (H) 08/06/2013   HGB 13.4 08/06/2013   PLT 391 08/06/2013    Speciality Comments: No specialty comments available.  Procedures:  No procedures performed Allergies: Compazine and Tape    Assessment / Plan:     Visit Diagnoses: Fibromyalgia -She continues to experience intermittent myalgias and muscle tenderness due to  fibromyalgia.  She has some ongoing trapezius muscle tension and tenderness bilaterally.  Overall her symptoms have been tolerable over the past month.  She has been out of work due to undergoing a left total mastectomy on 02/11/2021 performed by Dr. Donne Hazel.  Overall she has been sleeping better at night since being out of work.  She continues to take Ambien 10 mg at bedtime and gabapentin 300 mg 2 capsules at bedtime which help her to sleep at night.  For generalized myalgias and muscle tenderness she has been taking methocarbamol 750 mg as needed and meloxicam 15 mg 1 tablet daily for pain relief.  Refills of methocarbamol and meloxicam were sent to the pharmacy today.  Discussed the importance of regular exercise and good sleep hygiene for the management of fibromyalgia.  She was encouraged to increase her activity level and perform stretching exercises on a daily basis.  She was advised to notify us if she develops any new or worsening symptoms.  She will follow-up in the office in 6 months.  Primary insomnia - She continues to take Ambien 10 mg 1 tablet by mouth at bedtime for insomnia.  She continues to find Ambien to be effective at managing her sleep cycle.  In the future she hopes to taper the dose of Ambien.   Other fatigue: Overall her energy level has been stable recently.  She has been sleeping better at night since she has been out of work.  Discussed the importance of regular exercise and good sleep hygiene.  She plans on increasing her activity level as tolerated.  Raynaud's disease without gangrene: Not currently active.  No signs of sclerodactyly noted.  No digital ulcerations or signs of gangrene noted.  Primary osteoarthritis of both hands: Mild DIP prominence consistent with osteoarthritis of both hands.  No tenderness or inflammation was noted on examination today.  She was able to make a complete fist bilaterally.  Her [place] joint pain has improved since she has been out of work.   Discussed the importance of joint protection and muscle strengthening.  She was encouraged to perform hand exercises daily.  Trochanteric bursitis of left hip: She has tenderness palpation over the left trochanteric bursa.  She perform stretching exercises on a daily basis and is planning on getting a massage which has alleviated her symptoms in the past.  Plantar fasciitis: She has ongoing discomfort in both feet due to plantar fasciitis.  Her symptoms are exacerbated by weightbearing activities.  Discussed the importance of wearing proper fitting shoes.  DDD (degenerative disc disease), lumbar - She has multilevel spondylosis and facet joint arthropathy.  She is not experiencing any increased discomfort in her lower back currently.  No symptoms of radiculopathy at this time.  She plans on increasing her activity level and working on weight loss.  Osteopenia of multiple sites - DEXA 12/01/19 T-score: -2.0, BMD: 0.622. She had GI intolerance to bisphosphonates in the past.  She will have repeat DEXA in October 2023 .  Medication monitoring encounter: Patient takes meloxicam 15 mg 1 tablet daily for pain relief.  CBC and CMP were updated on 03/17/2021 and results were reviewed today in the office.  Creatinine was 0.84 and GFR was 77.  LFTs were within normal limits.  Other medical conditions are listed as follows:  History of hyperlipidemia: She remains on Crestor as prescribed.  History of breast cancer: She underwent a left total mastectomy on 02/11/2021 by Dr. Donne Hazel after being diagnosed with ductal carcinoma in situ of the left breast.  She started physical therapy on 03/17/2021.  She currently remains out of work but plans on returning to work 2 days a week in March 2023.  History of hypothyroidism: TSH was 0.20 on 03/17/2021.  Her dose of Synthroid was adjusted recently by her PCP.  Orders: No orders of the defined types were placed in this encounter.  Meds ordered this encounter   Medications   methocarbamol (ROBAXIN) 750 MG tablet    Sig: Take 1 tablet (750 mg total) by mouth at bedtime as needed for muscle spasms.    Dispense:  90 tablet    Refill:  0   meloxicam (MOBIC) 15 MG tablet    Sig: Take 1 tablet (15 mg total) by mouth daily.    Dispense:  90 tablet    Refill:  0     Follow-Up Instructions: Return in about 6 months (around 09/22/2021) for Fibromyalgia, Osteoarthritis, DDD.   Ofilia Neas, PA-C  Note - This record has been created using Dragon software.  Chart creation errors have been sought, but may not always  have been located. Such creation errors do not reflect on  the standard of medical care.

## 2021-03-17 ENCOUNTER — Other Ambulatory Visit: Payer: Self-pay

## 2021-03-17 ENCOUNTER — Ambulatory Visit: Payer: Medicare Other | Attending: General Surgery

## 2021-03-17 DIAGNOSIS — D0512 Intraductal carcinoma in situ of left breast: Secondary | ICD-10-CM | POA: Insufficient documentation

## 2021-03-17 DIAGNOSIS — Z483 Aftercare following surgery for neoplasm: Secondary | ICD-10-CM | POA: Insufficient documentation

## 2021-03-17 DIAGNOSIS — R600 Localized edema: Secondary | ICD-10-CM | POA: Diagnosis not present

## 2021-03-17 DIAGNOSIS — Z79899 Other long term (current) drug therapy: Secondary | ICD-10-CM | POA: Diagnosis not present

## 2021-03-17 NOTE — Therapy (Deleted)
OUTPATIENT PHYSICAL THERAPY ONCOLOGY EVALUATION  Patient Name: Michelle Gray MRN: [id number] DOB:May 01, 1955, 66 y.o., female Today's Date: 03/17/2021    Past Medical History:  Diagnosis Date   Arthritis    Asthma    Cancer [facility]) 1990   right breast ca-mast with reconstr   Cancer [facility]) 12/2020   left breast DCIS   Fibromyalgia    Hyperlipidemia    Hypothyroidism    Neuromuscular disorder ([place])    Osteoporosis    PONV (postoperative nausea and vomiting)    Thyroid disease    Past Surgical History:  Procedure Laterality Date   APPENDECTOMY     BREAST IMPLANT REMOVAL Right 02/11/2021   Procedure: REMOVAL RIGHT BREAST IMPLANT;  Surgeon: Irene Limbo, MD;  Location: [place];  Service: Plastics;  Laterality: Right;   BREAST SURGERY Right 1990   Mastectomy   CAPSULECTOMY Right 02/11/2021   Procedure: CAPSULECTOMY;  Surgeon: Irene Limbo, MD;  Location: [place];  Service: Plastics;  Laterality: Right;   FOOT SURGERY     LAPROSCOPIC     TOTAL MASTECTOMY Left 02/11/2021   Procedure: LEFT TOTAL MASTECTOMY;  Surgeon: Rolm Bookbinder, MD;  Location: [place];  Service: General;  Laterality: Left;   [place]     Patient Active Problem List   Diagnosis Date Noted   Breast cancer, left breast ([place]) 02/11/2021   Cough variant asthma with ? component upper airway cough syndrome 12/23/2018   Primary osteoarthritis of both hands 02/21/2016   Fibromyalgia 02/21/2016   History of hypothyroidism 02/21/2016   Dyslipidemia 02/21/2016   History of breast cancer 02/21/2016   Age-related osteoporosis without current pathological fracture 02/21/2016   Unspecified hypothyroidism 08/06/2013   Other and unspecified hyperlipidemia 08/06/2013   Insomnia 08/06/2013   Intrinsic asthma 08/06/2013   Hip pain, left 01/12/2011   Plantar fasciitis 01/12/2011    PCP: Cari Caraway, MD  REFERRING PROVIDER: Rolm Bookbinder, MD  REFERRING DIAG: Left breast Cancer  THERAPY DIAG:  Intraductal carcinoma in situ of left breast  Aftercare following surgery for neoplasm  ONSET DATE: 02/11/2021  SUBJECTIVE                                                                                                                                                                                           SUBJECTIVE STATEMENT: Since surgery some tightness in axilla and around incision.  Have been doing 4 post op exercises.  There is a dimple in left incision,and I think the right seroma is resolved now.  PERTINENT HISTORY:   PAIN:  Are you having pain? No NPRS  scale: ***/10 Pain location: *** Pain orientation: {Pain Orientation:25161}  PAIN TYPE: {type:313116} Pain description: {PAIN DESCRIPTION:21022940}  Aggravating factors: *** Relieving factors: ***  PRECAUTIONS: {Therapy precautions:24002}  WEIGHT BEARING RESTRICTIONS No  FALLS:  Has patient fallen in last 6 months? 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, playing cards, reading, yard work  HAND DOMINANCE : right   PRIOR LEVEL OF FUNCTION: Independent  PATIENT GOALS ***   OBJECTIVE  COGNITION:  Overall cognitive status: Within functional limits for tasks assessed   PALPATION: ***  OBSERVATIONS / OTHER ASSESSMENTS: ***  SENSATION:  Light touch: mild numbness at left incision, right   POSTURE: Forward head ,rounded shoulders  UPPER EXTREMITY AROM/PROM:  A/PROM RIGHT  03/17/2021   Shoulder extension 58  Shoulder flexion 157  Shoulder abduction 140  Shoulder internal rotation 60  Shoulder external rotation 100    (Blank rows = not tested)  A/PROM LEFT  03/17/2021  Shoulder extension 57  Shoulder flexion 153  Shoulder abduction 130  Shoulder internal rotation 60  Shoulder external rotation 88     (Blank rows = not tested)   CERVICAL AROM: All within functional limits: limited most with UT's     UPPER EXTREMITY STRENGTH: ***   not tested)  A/PROM LEFT 03/17/2021  Hip flexion   Hip extension   Hip abduction   Hip adduction   Hip internal rotation   Hip external rotation   Knee flexion   Knee extension   Ankle dorsiflexion   Ankle plantarflexion   Ankle inversion   Ankle eversion    (Blank rows = not tested)  LE MMT: ***  LYMPHEDEMA ASSESSMENTS:   SURGERY TYPE/DATE: ***  NUMBER OF LYMPH NODES REMOVED: ***  CHEMOTHERAPY: ***  RADIATION:***  HORMONE TREATMENT: ***  INFECTIONS: ***  LYMPHEDEMA ASSESSMENTS:   LANDMARK RIGHT  03/17/2021  10 cm proximal to olecranon process 29.9  Olecranon process 23.4  10 cm proximal to ulnar styloid process 19.2  Just proximal to ulnar styloid process 14.3  Across hand at thumb web space 18.7  At base of 2nd digit 6.0  (Blank rows = not tested)  LANDMARK LEFT  03/17/2021  10 cm proximal to olecranon process 29.3  Olecranon process 24.4  10 cm proximal to ulnar styloid process 19.3  Just proximal to ulnar styloid process 14.1  Across hand at thumb web space 18.5  At base of 2nd digit 6.0  (Blank rows = not tested)    LE LANDMARK RIGHT 03/17/2021  At groin   30 cm proximal to suprapatella   20 cm proximal to suprapatella   10 cm proximal to suprapatella   At midpatella / popliteal crease   30 cm proximal to floor at lateral plantar foot   20 cm proximal to floor at lateral plantar foot   10 cm proximal to floor at lateral plantar foot   Circumference of ankle/heel   5 cm proximal to 1st MTP joint   Across MTP joint   Around proximal great toe   (Blank rows = not tested)  LE LANDMARK LEFT 03/17/2021  At groin   30 cm proximal to suprapatella   20 cm proximal to suprapatella   10 cm proximal to suprapatella   At midpatella / popliteal crease   30 cm proximal to floor at lateral plantar foot   20  cm proximal to floor at lateral plantar foot  10 cm proximal to floor at lateral plantar foot   Circumference of ankle/heel   5 cm proximal to 1st MTP joint   Across MTP joint   Around proximal great toe   (Blank rows = not tested)  FUNCTIONAL TESTS:  {Functional tests:24029}  GAIT: Distance walked: *** Assistive device utilized: {Assistive devices:23999} Level of assistance: {Levels of assistance:24026} Comments: ***   L-DEX LYMPHEDEMA SCREENING:  The patient was assessed using the L-Dex machine today to produce a lymphedema index baseline score. The patient will be reassessed on a regular basis (typically every 3 months) to obtain new L-Dex scores. If the score is > 6.5 points away from his/her baseline score indicating onset of subclinical lymphedema, it will be recommended to wear a compression garment for 4 weeks, 12 hours per day and then be reassessed. If the score continues to be > 6.5 points from baseline at reassessment, we will initiate lymphedema treatment. Assessing in this manner has a 95% rate of preventing clinically significant lymphedema.     QUICK DASH SURVEY: ***   TODAY'S TREATMENT  ***  PATIENT EDUCATION:  Education details: *** Person educated: {Person educated:25204} Education method: {Education Method:25205} Education comprehension: {Education Comprehension:25206}   HOME EXERCISE PROGRAM: ***  ASSESSMENT:  CLINICAL IMPRESSION: Patient is a *** y.o. *** who was seen today for physical therapy evaluation and treatment for ***. Objective impairments include {opptimpairments:25111}. These impairments are limiting patient from {activity limitations:25113}. Personal factors including {Personal factors:25162} are also affecting patient's functional outcome. Patient will benefit from skilled PT to address above impairments and improve overall function.  REHAB POTENTIAL: {rehabpotential:25112}  CLINICAL DECISION MAKING: {clinical decision  making:25114}  EVALUATION COMPLEXITY: {Evaluation complexity:25115}   GOALS: Goals reviewed with patient? {yes/no:20286}  SHORT TERM GOALS:  STG Name Target Date Goal status  1 *** Baseline:  {follow up:25551} {GOALSTATUS:25110}  2 *** Baseline:  {follow up:25551} {GOALSTATUS:25110}  3 *** Baseline: {follow up:25551} {GOALSTATUS:25110}  4 *** Baseline: {follow up:25551} {GOALSTATUS:25110}  5 *** Baseline: {follow up:25551} {GOALSTATUS:25110}  6 *** Baseline: {follow up:25551} {GOALSTATUS:25110}  7 *** Baseline: {follow up:25551} {GOALSTATUS:25110}   LONG TERM GOALS:   LTG Name Target Date Goal status  1 *** Baseline: {follow up:25551} {GOALSTATUS:25110}  2 *** Baseline: {follow up:25551} {GOALSTATUS:25110}  3 *** Baseline: {follow up:25551} {GOALSTATUS:25110}  4 *** Baseline: {follow up:25551} {GOALSTATUS:25110}  5 *** Baseline: {follow up:25551} {GOALSTATUS:25110}  6 *** Baseline: {follow up:25551} {GOALSTATUS:25110}  7 *** Baseline: {follow up:25551} {GOALSTATUS:25110}   PLAN: PT FREQUENCY: {rehab frequency:25116}  PT DURATION: {rehab duration:25117}  PLANNED INTERVENTIONS: {rehab planned interventions:25118::"Therapeutic exercises","Therapeutic activity","Neuro Muscular re-education","Balance training","Gait training","Patient/Family education","Joint mobilization"}  PLAN FOR NEXT SESSION: ***   Claris Pong, PT 03/17/2021, 9:04 AM

## 2021-03-17 NOTE — Patient Instructions (Addendum)
SHOULDER: Flexion - Supine (Cane)        Cancer Rehab [phone]    Hold cane in both hands. Raise arms up overhead. Do not allow back to arch. Hold _5__ seconds. Do __5-10__ times; __1-2__ times a day. 1. Hands shoulder width 2. Hands V position, slightly wider than shoulder width    Shoulder Blade Stretch    Clasp fingers behind head with elbows touching in front of face. Pull elbows back while pressing shoulder blades together. Relax and hold as tolerated, can place pillow under elbow here for comfort as needed and to allow for prolonged stretch.  Repeat __5__ times. Do __1-2__ sessions per day.     Copyright  VHI. All rights reserved.       [facility]  [address]  [place] [address]  [phone]  After Breast Cancer Class It is recommended you attend the ABC class to be educated on lymphedema risk reduction. This class is free of charge and lasts for 1 hour. It is a 1-time class. You will need to download the Webex app either on your phone or computer. We will send you a link the night before or the morning of the class. You should be able to click on that link to join the class. This is not a confidential class. You don't have to turn your camera on, but other participants may be able to see your email address.  Scar massage You can begin gentle scar massage to you incision sites. Gently place one hand on the incision and move the skin (without sliding on the skin) in various directions. Do this for a few minutes and then you can gently massage either coconut oil or vitamin E cream into the scars.  Compression garment You should continue wearing your compression bra until you feel like you no longer have swelling.  Home exercise Program Continue doing the exercises you were given until you feel like you can do them without feeling any tightness at the end.   Walking Program Studies show that 30 minutes of walking per day (fast  enough to elevate your heart rate) can significantly reduce the risk of a cancer recurrence. If you can't walk due to other medical reasons, we encourage you to find another activity you could do (like a stationary bike or water exercise).  Posture After breast cancer surgery, people frequently sit with rounded shoulders posture because it puts their incisions on slack and feels better. If you sit like this and scar tissue forms in that position, you can become very tight and have pain sitting or standing with good posture. Try to be aware of your posture and sit and stand up tall to heal properly.

## 2021-03-17 NOTE — Therapy (Signed)
OUTPATIENT PHYSICAL THERAPY ONCOLOGY EVALUATION  Patient Name: Michelle Gray MRN: [id number] DOB:10/19/1955, 66 y.o., female Today's Date: 03/17/2021   PT End of Session - 03/17/21 1016     Visit Number 1    Number of Visits 12    Date for PT Re-Evaluation 04/28/21    PT Start Time 0903    PT Stop Time 0955    PT Time Calculation (min) 52 min    Activity Tolerance Patient tolerated treatment well    Behavior During Therapy [facility] for tasks assessed/performed             Past Medical History:  Diagnosis Date   Arthritis    Asthma    Cancer ([place]) 1990   right breast ca-mast with reconstr   Cancer [facility]) 12/2020   left breast DCIS   Fibromyalgia    Hyperlipidemia    Hypothyroidism    Neuromuscular disorder ([place])    Osteoporosis    PONV (postoperative nausea and vomiting)    Thyroid disease    Past Surgical History:  Procedure Laterality Date   APPENDECTOMY     BREAST IMPLANT REMOVAL Right 02/11/2021   Procedure: REMOVAL RIGHT BREAST IMPLANT;  Surgeon: Irene Limbo, MD;  Location: [place];  Service: Plastics;  Laterality: Right;   BREAST SURGERY Right 1990   Mastectomy   CAPSULECTOMY Right 02/11/2021   Procedure: CAPSULECTOMY;  Surgeon: Irene Limbo, MD;  Location: [place];  Service: Plastics;  Laterality: Right;   FOOT SURGERY     LAPROSCOPIC     TOTAL MASTECTOMY Left 02/11/2021   Procedure: LEFT TOTAL MASTECTOMY;  Surgeon: Rolm Bookbinder, MD;  Location: [place];  Service: General;  Laterality: Left;   [place]     Patient Active Problem List   Diagnosis Date Noted   Breast cancer, left breast ([place]) 02/11/2021   Cough variant asthma with ? component upper airway cough syndrome 12/23/2018   Primary osteoarthritis of both hands 02/21/2016   Fibromyalgia 02/21/2016   History of hypothyroidism 02/21/2016   Dyslipidemia 02/21/2016   History of breast cancer 02/21/2016   Age-related  osteoporosis without current pathological fracture 02/21/2016   Unspecified hypothyroidism 08/06/2013   Other and unspecified hyperlipidemia 08/06/2013   Insomnia 08/06/2013   Intrinsic asthma 08/06/2013   Hip pain, left 01/12/2011   Plantar fasciitis 01/12/2011    PCP: Cari Caraway, MD  REFERRING PROVIDER: Rolm Bookbinder, MD  REFERRING DIAG: Left breast Cancer  THERAPY DIAG:  Intraductal carcinoma in situ of left breast - Plan: PT plan of care cert/re-cert, CANCELED: PT plan of care cert/re-cert  Aftercare following surgery for neoplasm - Plan: PT plan of care cert/re-cert, CANCELED: PT plan of care cert/re-cert  Localized edema - Plan: PT plan of care cert/re-cert, CANCELED: PT plan of care cert/re-cert  ONSET DATE: 7/85/8850  SUBJECTIVE  SUBJECTIVE STATEMENT: Pt is s/p left mastectomy for DCIS ER+,PR+, and right implant removal on 02/11/2021 from a prior right mastectomy in 1990.Since surgery she is experiencing some tightness in the left axilla and around incision.  She has been doing 4 post op exercises for about a week after drains were removed on Feb. 7..  There is a dimple in left lateral incision,and I think the right seroma is resolved now.  PERTINENT HISTORY: Pt has a history of right triple negative breast cancer treated in 1990 with a right mastectomy with implant reconstruction.  She had 14 LN's removed.  She is now s/p Left mastectomy with right implant removal on 02/11/2021 for left DCIS ER +, PR+. She will not require any further treatment and was told she may return to work activities  PAIN:  Are you having pain? No, just tightness/discomfort  PRECAUTIONS: Other: 14 LN's removed on right (lymphedema risk)  WEIGHT BEARING RESTRICTIONS No  FALLS:  Has patient fallen in last 6  months? 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 Left incision still slightly raised and with some adhesions noted, with dimpling     laterally and mild swelling. Left chest medial to axilla with mild swelling. Right incision healing well. Possible mild swelling ar right medial chest region  SENSATION:  Light touch: mild numbness at left incision, right posterior arm and chest from prior surgery  POSTURE: Forward head ,rounded shoulders  UPPER EXTREMITY AROM/PROM:  A/PROM RIGHT  03/17/2021   Shoulder extension 58  Shoulder flexion 157  Shoulder abduction 140  Shoulder internal rotation 60  Shoulder external rotation 100    (Blank rows = not tested)  A/PROM LEFT  03/17/2021  Shoulder extension 57  Shoulder flexion 153  Shoulder abduction 130  Shoulder internal rotation 60  Shoulder external rotation 88    (Blank rows = not tested)   CERVICAL AROM: All within functional limits: limited most with SB     UPPER EXTREMITY STRENGTH: WFL      LYMPHEDEMA ASSESSMENTS:   SURGERY TYPE/DATE: 02/11/2021 Left breast Cancer DCIS  NUMBER OF LYMPH NODES REMOVED: 0 on left, 14 in 1990 on right  CHEMOTHERAPY: no  RADIATION:no  HORMONE TREATMENT: no  INFECTIONS: no  LYMPHEDEMA ASSESSMENTS:   LANDMARK RIGHT  03/17/2021  10 cm proximal to olecranon process 29.9  Olecranon process 23.4  10 cm proximal to ulnar styloid process 19.2   Just proximal to ulnar styloid process 14.3  Across hand at thumb web space 18.7  At base of 2nd digit 6.0  (Blank rows = not tested)  LANDMARK LEFT  03/17/2021  10 cm proximal to olecranon process 29.3  Olecranon process 24.4  10 cm proximal to ulnar styloid process 19.3  Just proximal to ulnar styloid process 14.1  Across hand at thumb web space 18.5  At base of 2nd digit 6.0  (Blank rows = not tested)          QUICK DASH SURVEY:   [name] -  03/17/21 0001     Open a tight or new jar Mild difficulty    Do heavy household chores (wash walls, wash floors) Mild difficulty    Carry a shopping bag or briefcase No difficulty    Wash your back No difficulty    Use a knife to cut food No difficulty    During the past week, to what extent has your arm, shoulder or hand problem interfered with your normal social activities with family, friends, neighbors, or groups? Not at all    During the past week, to what extent has your arm, shoulder or hand problem limited your work or other regular daily activities Slightly    Arm, shoulder, or hand pain. None    Tingling (pins and needles) in your arm, shoulder, or hand None    Difficulty Sleeping No difficulty    DASH Score 4.55 %               TODAY'S TREATMENT  Reviewed briefly 4 post op exercises and instructed in supine wand exercises for flexion and scaption. Reviewed standing wall stretch how she can move closer to the wall to improve ROM.  Made 2 half inch gray foam pads to place at left incision site to flatten incision and 1 to place at medial to left axilla area of swelling. She will place in her sports bra when she gets home.  PATIENT EDUCATION:  Education details: supine wand flexion and abduction, wall slide Person educated: Patient Education method: Theatre stage manager Education comprehension: returned demonstration   HOME EXERCISE PROGRAM: 4 post op exercises, supine wand flexion and scaption in place of  clasped hands  ASSESSMENT:  CLINICAL IMPRESSION: Patient is a 66 y.o. female who was seen today for physical therapy evaluation and treatment for left breast cancer s/p mastectomy for DCIS and right implant removal.  She is progressing nicely with ROM, but does have restrictions bilaterally greatest with abduction.  Incisions are healing well, but the left incision is raised with some lateral dimpling from adhesions/swelling.  There is also mild chest swelling.Objective impairments include decreased activity tolerance, decreased ROM, decreased strength, increased edema, increased fascial restrictions, and impaired UE functional use. These impairments are limiting patient from cleaning, occupation, and yard work. Personal factors including 1-2 comorbidities: bilateral breast cancer,   are also affecting patient's functional outcome. Patient will benefit from skilled PT to address above impairments and improve overall function.  REHAB POTENTIAL: Excellent  CLINICAL DECISION MAKING: Stable/uncomplicated  EVALUATION COMPLEXITY: Low   GOALS: Goals reviewed with patient? Yes  SHORT TERM GOALS:  STG Name Target Date Goal status  1 Pt will be independent in a HEP for bilateral shoulder ROM and strengthening Baseline:  04/07/2021 INITIAL                                 LONG TERM GOALS:   LTG Name Target Date Goal status  1 Pts bilateral shoulder abduction will be increased to atleast 160 degrees Baseline: 04/28/2021 INITIAL  2 Pt will be independent in self MLD for chest swelling Baseline: 04/28/2021 INITIAL  3 Pt will attend ABC class Baseline: 04/28/2021 INITIAL  4 Pts left mastectomy scar will flatten out and will have no lateral dimpling Baseline: 04/28/2021 INITIAL  5     6     7       PLAN: PT FREQUENCY: 1-2x/week  PT DURATION: 6 weeks  PLANNED INTERVENTIONS: Therapeutic  exercises, Therapeutic activity, Neuro Muscular re-education, Patient/Family education, Manual lymph  drainage, and Manual therapy  PLAN FOR NEXT SESSION: reassess bilateral shoulder abduction, could she wear foam packs in sports bra?, STM prn pecs, lats,UT, scar mobilization,PROM prn, MLD to lateral trunk, chest areas of swelling.   Claris Pong, PT 03/17/2021, 12:27 PM

## 2021-03-24 ENCOUNTER — Ambulatory Visit: Payer: BC Managed Care – PPO | Admitting: Rheumatology

## 2021-03-25 ENCOUNTER — Ambulatory Visit: Payer: Medicare Other

## 2021-03-25 ENCOUNTER — Ambulatory Visit (INDEPENDENT_AMBULATORY_CARE_PROVIDER_SITE_OTHER): Payer: Medicare Other | Admitting: Physician Assistant

## 2021-03-25 ENCOUNTER — Encounter: Payer: Self-pay | Admitting: Physician Assistant

## 2021-03-25 ENCOUNTER — Other Ambulatory Visit: Payer: Self-pay

## 2021-03-25 VITALS — BP 127/74 | HR 75 | Ht 67.25 in | Wt 160.4 lb

## 2021-03-25 DIAGNOSIS — M19042 Primary osteoarthritis, left hand: Secondary | ICD-10-CM

## 2021-03-25 DIAGNOSIS — M8589 Other specified disorders of bone density and structure, multiple sites: Secondary | ICD-10-CM | POA: Diagnosis not present

## 2021-03-25 DIAGNOSIS — M7702 Medial epicondylitis, left elbow: Secondary | ICD-10-CM

## 2021-03-25 DIAGNOSIS — I7300 Raynaud's syndrome without gangrene: Secondary | ICD-10-CM | POA: Diagnosis not present

## 2021-03-25 DIAGNOSIS — Z853 Personal history of malignant neoplasm of breast: Secondary | ICD-10-CM

## 2021-03-25 DIAGNOSIS — M19041 Primary osteoarthritis, right hand: Secondary | ICD-10-CM | POA: Diagnosis not present

## 2021-03-25 DIAGNOSIS — Z5181 Encounter for therapeutic drug level monitoring: Secondary | ICD-10-CM

## 2021-03-25 DIAGNOSIS — M797 Fibromyalgia: Secondary | ICD-10-CM | POA: Diagnosis not present

## 2021-03-25 DIAGNOSIS — M7062 Trochanteric bursitis, left hip: Secondary | ICD-10-CM | POA: Diagnosis not present

## 2021-03-25 DIAGNOSIS — M722 Plantar fascial fibromatosis: Secondary | ICD-10-CM

## 2021-03-25 DIAGNOSIS — M5136 Other intervertebral disc degeneration, lumbar region: Secondary | ICD-10-CM

## 2021-03-25 DIAGNOSIS — D0512 Intraductal carcinoma in situ of left breast: Secondary | ICD-10-CM | POA: Diagnosis not present

## 2021-03-25 DIAGNOSIS — R5383 Other fatigue: Secondary | ICD-10-CM

## 2021-03-25 DIAGNOSIS — Z483 Aftercare following surgery for neoplasm: Secondary | ICD-10-CM | POA: Diagnosis not present

## 2021-03-25 DIAGNOSIS — R600 Localized edema: Secondary | ICD-10-CM

## 2021-03-25 DIAGNOSIS — Z8639 Personal history of other endocrine, nutritional and metabolic disease: Secondary | ICD-10-CM

## 2021-03-25 DIAGNOSIS — M7711 Lateral epicondylitis, right elbow: Secondary | ICD-10-CM

## 2021-03-25 DIAGNOSIS — F5101 Primary insomnia: Secondary | ICD-10-CM

## 2021-03-25 DIAGNOSIS — M7061 Trochanteric bursitis, right hip: Secondary | ICD-10-CM

## 2021-03-25 MED ORDER — MELOXICAM 15 MG PO TABS: 15.0000 mg | ORAL_TABLET | Freq: Every day | ORAL | 0 refills | Status: DC

## 2021-03-25 MED ORDER — METHOCARBAMOL 750 MG PO TABS: 750.0000 mg | ORAL_TABLET | Freq: Every evening | ORAL | 0 refills | Status: DC | PRN

## 2021-03-25 NOTE — Therapy (Signed)
OUTPATIENT PHYSICAL THERAPY TREATMENT NOTE   Patient Name: Michelle Gray MRN: [id number] DOB:1955/07/09, 66 y.o., female Today's Date: 03/25/2021  PCP: Cari Caraway, MD REFERRING PROVIDER:Wakefield,Mathew MD   PT End of Session - 03/25/21 1004     Visit Number 2    Number of Visits 12    Date for PT Re-Evaluation 04/28/21    PT Start Time 1007    PT Stop Time 1050    PT Time Calculation (min) 43 min             Past Medical History:  Diagnosis Date   Arthritis    Asthma    Cancer ([place]) 1990   right breast ca-mast with reconstr   Cancer [facility]) 12/2020   left breast DCIS   Fibromyalgia    Hyperlipidemia    Hypothyroidism    Neuromuscular disorder ([place])    Osteoporosis    PONV (postoperative nausea and vomiting)    Thyroid disease    Past Surgical History:  Procedure Laterality Date   APPENDECTOMY     BREAST IMPLANT REMOVAL Right 02/11/2021   Procedure: REMOVAL RIGHT BREAST IMPLANT;  Surgeon: Irene Limbo, MD;  Location: [place];  Service: Plastics;  Laterality: Right;   BREAST SURGERY Right 1990   Mastectomy   CAPSULECTOMY Right 02/11/2021   Procedure: CAPSULECTOMY;  Surgeon: Irene Limbo, MD;  Location: [place];  Service: Plastics;  Laterality: Right;   FOOT SURGERY     LAPROSCOPIC     TOTAL MASTECTOMY Left 02/11/2021   Procedure: LEFT TOTAL MASTECTOMY;  Surgeon: Rolm Bookbinder, MD;  Location: [place];  Service: General;  Laterality: Left;   [place]     Patient Active Problem List   Diagnosis Date Noted   Breast cancer, left breast ([place]) 02/11/2021   Cough variant asthma with ? component upper airway cough syndrome 12/23/2018   Primary osteoarthritis of both hands 02/21/2016   Fibromyalgia 02/21/2016   History of hypothyroidism 02/21/2016   Dyslipidemia 02/21/2016   History of breast cancer 02/21/2016   Age-related osteoporosis without current pathological fracture  02/21/2016   Unspecified hypothyroidism 08/06/2013   Other and unspecified hyperlipidemia 08/06/2013   Insomnia 08/06/2013   Intrinsic asthma 08/06/2013   Hip pain, left 01/12/2011   Plantar fasciitis 01/12/2011    REFERRING DIAG: Left Breast Cander  THERAPY DIAG:  Intraductal carcinoma in situ of left breast  Aftercare following surgery for neoplasm  Localized edema  PERTINENT HISTORY: PERTINENT HISTORY: Pt has a history of right triple negative breast cancer treated in 1990 with a right mastectomy with implant reconstruction.  She had 14 LN's removed.  She is now s/p Left mastectomy with right implant removal on 02/11/2021 for left DCIS ER +, PR+. She will not require any further treatment and was told she may return to work activities  PRECAUTIONS: 14 LN's removed on right, Raynauds  SUBJECTIVE: I think my ROM is doing well and I feel like the incision is flattening out. I go back to work 2 half days this week, and maybe 3 the following.  PAIN:  Are you having pain? No, just tightness      OBJECTIVE Taken at evaluation on 03/17/2021   COGNITION:            Overall cognitive status: Within functional limits for tasks assessed    PALPATION: Not assessed   OBSERVATIONS / OTHER ASSESSMENTS: pt incisions healing well. Left incision still slightly raised and with some adhesions  noted, with dimpling     laterally and mild swelling. Left chest medial to axilla with mild swelling. Right incision healing well. Possible mild swelling ar right medial chest region   SENSATION:            Light touch: mild numbness at left incision, right posterior arm and chest from prior surgery            POSTURE: Forward head ,rounded shoulders   UPPER EXTREMITY AROM/PROM:   A/PROM RIGHT  03/17/2021    Shoulder extension 58  Shoulder flexion 157  Shoulder abduction 140  Shoulder internal rotation 60  Shoulder external rotation 100                          (Blank rows = not tested)    A/PROM LEFT  03/17/2021  Shoulder extension 57  Shoulder flexion 153  Shoulder abduction 130  Shoulder internal rotation 60  Shoulder external rotation 88                          (Blank rows = not tested)     CERVICAL AROM: All within functional limits: limited most with SB         UPPER EXTREMITY STRENGTH: WFL         LYMPHEDEMA ASSESSMENTS:    SURGERY TYPE/DATE: 02/11/2021 Left breast Cancer DCIS   NUMBER OF LYMPH NODES REMOVED: 0 on left, 14 in 1990 on right   CHEMOTHERAPY: no   RADIATION:no   HORMONE TREATMENT: no   INFECTIONS: no   LYMPHEDEMA ASSESSMENTS:  03/17/2021  LANDMARK RIGHT  03/17/2021  10 cm proximal to olecranon process 29.9  Olecranon process 23.4  10 cm proximal to ulnar styloid process 19.2  Just proximal to ulnar styloid process 14.3  Across hand at thumb web space 18.7  At base of 2nd digit 6.0  (Blank rows = not tested)   LANDMARK LEFT  03/17/2021  10 cm proximal to olecranon process 29.3  Olecranon process 24.4  10 cm proximal to ulnar styloid process 19.3  Just proximal to ulnar styloid process 14.1  Across hand at thumb web space 18.5  At base of 2nd digit 6.0  (Blank rows = not tested)                   QUICK DASH SURVEY:    [name] - 03/17/21 0001       Open a tight or new jar Mild difficulty     Do heavy household chores (wash walls, wash floors) Mild difficulty     Carry a shopping bag or briefcase No difficulty     Wash your back No difficulty     Use a knife to cut food No difficulty     During the past week, to what extent has your arm, shoulder or hand problem interfered with your normal social activities with family, friends, neighbors, or groups? Not at all     During the past week, to what extent has your arm, shoulder or hand problem limited your work or other regular daily activities Slightly     Arm, shoulder, or hand pain. None     Tingling (pins and needles) in your arm, shoulder, or hand None      Difficulty Sleeping No difficulty     DASH Score 4.55 %  TODAY'S TREATMENT  03/25/2021 Pulleys x 2 min flexion, scaption and abduction, ball rolls x 10 flexion, lower trunk rotation in goal post position x 3 each Soft tissue mobilization  to left pectorals, lats, bilateral UT/ Scar mobilization bilaterally PROM bilateral shoulder flexion, scaption, abd, ER  03/17/2021 Reviewed briefly 4 post op exercises and instructed in supine wand exercises for flexion and scaption. Reviewed standing wall stretch how she can move closer to the wall to improve ROM.  Made 2 half inch gray foam pads to place at left incision site to flatten incision and 1 to place at medial to left axilla area of swelling. She will place in her sports bra when she gets home.   PATIENT EDUCATION: LTR with goal post arms   HOME EXERCISE PROGRAM: 4 post op exercises, supine wand flexion and scaption in place of clasped hands   ASSESSMENT:   CLINICAL IMPRESSION: Performed AAROM activities with pulleys and ball, followed by soft tissue and scar mobilization and PROM.  Pt is making excellent progress and incison ,particularly on the left is flattening out very nicely.  There is what appears to be a small stitch abcess that is red. She will watch the area and contact MD prn. ROM improving nicely, but tenderness remains especially at axillary border of left pectorals.  REHAB POTENTIAL: Excellent   CLINICAL DECISION MAKING: Stable/uncomplicated   EVALUATION COMPLEXITY: Low     GOALS: Goals reviewed with patient? Yes   SHORT TERM GOALS:   STG Name Target Date Goal status  1 Pt will be independent in a HEP for bilateral shoulder ROM and strengthening Baseline:  04/07/2021 INITIAL                                                          LONG TERM GOALS:    LTG Name Target Date Goal status  1 Pts bilateral shoulder abduction will be increased to atleast 160 degrees Baseline: 04/28/2021  INITIAL  2 Pt will be independent in self MLD for chest swelling Baseline: 04/28/2021 INITIAL  3 Pt will attend ABC class Baseline: 04/28/2021 INITIAL  4 Pts left mastectomy scar will flatten out and will have no lateral dimpling Baseline: 04/28/2021 INITIAL  5        6        7           PLAN: PT FREQUENCY: 1-2x/week   PT DURATION: 6 weeks   PLANNED INTERVENTIONS: Therapeutic exercises, Therapeutic activity, Neuro Muscular re-education, Patient/Family education, Manual lymph drainage, and Manual therapy   PLAN FOR NEXT SESSION: check stitch abcess area,reassess bilateral shoulder abduction AROM, STM prn pecs, lats,UT, scar mobilization,PROM prn, MLD to lateral trunk, chest areas of swelling prn        Claris Pong, PT 03/25/2021, 12:06 PM

## 2021-03-27 ENCOUNTER — Ambulatory Visit: Payer: Medicare Other

## 2021-03-27 ENCOUNTER — Other Ambulatory Visit: Payer: Self-pay

## 2021-03-27 DIAGNOSIS — R600 Localized edema: Secondary | ICD-10-CM

## 2021-03-27 DIAGNOSIS — D0512 Intraductal carcinoma in situ of left breast: Secondary | ICD-10-CM | POA: Diagnosis not present

## 2021-03-27 DIAGNOSIS — Z483 Aftercare following surgery for neoplasm: Secondary | ICD-10-CM

## 2021-03-27 NOTE — Patient Instructions (Signed)

## 2021-03-27 NOTE — Therapy (Signed)
OUTPATIENT PHYSICAL THERAPY TREATMENT NOTE   Patient Name: Michelle Gray MRN: [id number] DOB:02-May-1955, 66 y.o., female Today's Date: 03/27/2021  PCP: Cari Caraway, MD REFERRING PROVIDER:Wakefield,Mathew MD   PT End of Session - 03/27/21 1457     Visit Number 3    Number of Visits 12    Date for PT Re-Evaluation 04/28/21    PT Start Time 1502    PT Stop Time 8099    PT Time Calculation (min) 53 min    Activity Tolerance Patient tolerated treatment well    Behavior During Therapy [facility] for tasks assessed/performed             Past Medical History:  Diagnosis Date   Arthritis    Asthma    Cancer ([place]) 1990   right breast ca-mast with reconstr   Cancer [facility]) 12/2020   left breast DCIS   Fibromyalgia    Hyperlipidemia    Hypothyroidism    Neuromuscular disorder ([place])    Osteoporosis    PONV (postoperative nausea and vomiting)    Thyroid disease    Past Surgical History:  Procedure Laterality Date   APPENDECTOMY     BREAST IMPLANT REMOVAL Right 02/11/2021   Procedure: REMOVAL RIGHT BREAST IMPLANT;  Surgeon: Irene Limbo, MD;  Location: [place];  Service: Plastics;  Laterality: Right;   BREAST SURGERY Right 1990   Mastectomy   CAPSULECTOMY Right 02/11/2021   Procedure: CAPSULECTOMY;  Surgeon: Irene Limbo, MD;  Location: [place];  Service: Plastics;  Laterality: Right;   FOOT SURGERY     LAPROSCOPIC     TOTAL MASTECTOMY Left 02/11/2021   Procedure: LEFT TOTAL MASTECTOMY;  Surgeon: Rolm Bookbinder, MD;  Location: [place];  Service: General;  Laterality: Left;   [place]     Patient Active Problem List   Diagnosis Date Noted   Breast cancer, left breast ([place]) 02/11/2021   Cough variant asthma with ? component upper airway cough syndrome 12/23/2018   Primary osteoarthritis of both hands 02/21/2016   Fibromyalgia 02/21/2016   History of hypothyroidism 02/21/2016   Dyslipidemia  02/21/2016   History of breast cancer 02/21/2016   Age-related osteoporosis without current pathological fracture 02/21/2016   Unspecified hypothyroidism 08/06/2013   Other and unspecified hyperlipidemia 08/06/2013   Insomnia 08/06/2013   Intrinsic asthma 08/06/2013   Hip pain, left 01/12/2011   Plantar fasciitis 01/12/2011    REFERRING DIAG: Left Breast Cander  THERAPY DIAG:  Intraductal carcinoma in situ of left breast  Aftercare following surgery for neoplasm  Localized edema  PERTINENT HISTORY: PERTINENT HISTORY: Pt has a history of right triple negative breast cancer treated in 1990 with a right mastectomy with implant reconstruction.  She had 14 LN's removed.  She is now s/p Left mastectomy with right implant removal on 02/11/2021 for left DCIS ER +, PR+. She will not require any further treatment and was told she may return to work activities  PRECAUTIONS: 14 LN's removed on right, Raynauds  SUBJECTIVE: work wasn't bad.  Typing was fine. I had 5 pts and used the speculum without any problem. I think the stitch abcess is better. Its still sensitive at the incision, but otherwise it feels good.  PAIN: Are you having pain? No, just tightness    OBJECTIVE Taken at evaluation on 03/17/2021   UPPER EXTREMITY AROM/PROM:   A/PROM RIGHT  03/17/2021    Shoulder extension 58  Shoulder flexion 157  Shoulder abduction 140  Shoulder  internal rotation 60  Shoulder external rotation 100                          (Blank rows = not tested)   A/PROM LEFT  03/17/2021  Shoulder extension 57  Shoulder flexion 153  Shoulder abduction 130  Shoulder internal rotation 60  Shoulder external rotation 88                          (Blank rows = not tested)     CERVICAL AROM: All within functional limits: limited most with SB         UPPER EXTREMITY STRENGTH: WFL         LYMPHEDEMA ASSESSMENTS:    SURGERY TYPE/DATE: 02/11/2021 Left breast Cancer DCIS   NUMBER OF LYMPH NODES  REMOVED: 0 on left, 14 in 1990 on right   CHEMOTHERAPY: no   RADIATION:no   HORMONE TREATMENT: no   INFECTIONS: no   LYMPHEDEMA ASSESSMENTS:  03/17/2021  LANDMARK RIGHT  03/17/2021  10 cm proximal to olecranon process 29.9  Olecranon process 23.4  10 cm proximal to ulnar styloid process 19.2  Just proximal to ulnar styloid process 14.3  Across hand at thumb web space 18.7  At base of 2nd digit 6.0  (Blank rows = not tested)   LANDMARK LEFT  03/17/2021  10 cm proximal to olecranon process 29.3  Olecranon process 24.4  10 cm proximal to ulnar styloid process 19.3  Just proximal to ulnar styloid process 14.1  Across hand at thumb web space 18.5  At base of 2nd digit 6.0  (Blank rows = not tested)                   QUICK DASH SURVEY:    [name] - 03/17/21 0001       Open a tight or new jar Mild difficulty     Do heavy household chores (wash walls, wash floors) Mild difficulty     Carry a shopping bag or briefcase No difficulty     Wash your back No difficulty     Use a knife to cut food No difficulty     During the past week, to what extent has your arm, shoulder or hand problem interfered with your normal social activities with family, friends, neighbors, or groups? Not at all     During the past week, to what extent has your arm, shoulder or hand problem limited your work or other regular daily activities Slightly     Arm, shoulder, or hand pain. None     Tingling (pins and needles) in your arm, shoulder, or hand None     Difficulty Sleeping No difficulty     DASH Score 4.55 %                     TODAY'S TREATMENT  03/27/2021 Pulleys 2 min flexion and abduction, 1 min scaption. Ball rolls forward and sideways x 10 Soft tissue mobilization  to left pectorals, lats, bilateral UT/ Scar mobilization bilaterally PROM bilateral shoulder flexion, scaption, abd, ER Supine scapular series x 5 with yellow band  03/25/2021 Pulleys x 2 min flexion, scaption and  abduction, ball rolls x 10 flexion, lower trunk rotation in goal post position x 3 each Soft tissue mobilization  to left pectorals, lats, bilateral UT/ Scar mobilization bilaterally PROM bilateral shoulder flexion, scaption, abd, ER  03/17/2021 Reviewed briefly  4 post op exercises and instructed in supine wand exercises for flexion and scaption. Reviewed standing wall stretch how she can move closer to the wall to improve ROM.  Made 2 half inch gray foam pads to place at left incision site to flatten incision and 1 to place at medial to left axilla area of swelling. She will place in her sports bra when she gets home.   PATIENT EDUCATION: LTR with goal post arms   HOME EXERCISE PROGRAM: 4 post op exercises, supine wand flexion and scaption in place of clasped hands, supine scapular series with yellow   ASSESSMENT:   CLINICAL IMPRESSION: Performed AAROM activities with pulleys and ball, followed by soft tissue and scar mobilization and PROM.  Pt is making excellent progress and incison ,particularly on the left is flattening out very nicely.  There is what appears to be a small stitch abcess that is  slightly less red. She will watch the area and contact MD prn. ROM continues to improve.  She did very well with theraband exercises without complaints of pain  REHAB POTENTIAL: Excellent   CLINICAL DECISION MAKING: Stable/uncomplicated   EVALUATION COMPLEXITY: Low     GOALS: Goals reviewed with patient? Yes   SHORT TERM GOALS:   STG Name Target Date Goal status  1 Pt will be independent in a HEP for bilateral shoulder ROM and strengthening Baseline:  04/07/2021 INITIAL                                                          LONG TERM GOALS:    LTG Name Target Date Goal status  1 Pts bilateral shoulder abduction will be increased to atleast 160 degrees Baseline: 04/28/2021 INITIAL  2 Pt will be independent in self MLD for chest swelling Baseline: 04/28/2021 INITIAL  3 Pt will  attend ABC class Baseline: 04/28/2021 INITIAL  4 Pts left mastectomy scar will flatten out and will have no lateral dimpling Baseline: 04/28/2021 INITIAL  5        6        7           PLAN: PT FREQUENCY: 1-2x/week   PT DURATION: 6 weeks   PLANNED INTERVENTIONS: Therapeutic exercises, Therapeutic activity, Neuro Muscular re-education, Patient/Family education, Manual lymph drainage, and Manual therapy   PLAN FOR NEXT SESSION: check stitch abcess area,reassess bilateral shoulder abduction AROM, STM prn pecs, lats,UT, scar mobilization,PROM prn, MLD to lateral trunk, chest areas of swelling prn        Claris Pong, PT 03/27/2021, 4:02 PM

## 2021-04-01 ENCOUNTER — Other Ambulatory Visit: Payer: Self-pay

## 2021-04-01 ENCOUNTER — Ambulatory Visit: Payer: Medicare Other

## 2021-04-01 DIAGNOSIS — Z483 Aftercare following surgery for neoplasm: Secondary | ICD-10-CM | POA: Diagnosis not present

## 2021-04-01 DIAGNOSIS — R600 Localized edema: Secondary | ICD-10-CM | POA: Diagnosis not present

## 2021-04-01 DIAGNOSIS — D0512 Intraductal carcinoma in situ of left breast: Secondary | ICD-10-CM | POA: Diagnosis not present

## 2021-04-01 NOTE — Therapy (Signed)
OUTPATIENT PHYSICAL THERAPY TREATMENT NOTE   Patient Name: Michelle Gray MRN: [id number] DOB:[birth year], 66 y.o., female Today's Date: 04/01/2021  PCP: Michelle Caraway, MD REFERRING PROVIDER:[place],Mathew MD   PT End of Session - 04/01/21 [phone]     Visit Number 4    Number of Visits 12    Date for PT Re-Evaluation 04/28/21    PT Start Time 1000    PT Stop Time 1052    PT Time Calculation (min) 52 min    Activity Tolerance Patient tolerated treatment well    Behavior During Therapy Michelle Gray for tasks assessed/performed             Past Medical History:  Diagnosis Date   Arthritis    Asthma    Cancer ([place]) 1990   right breast ca-mast with reconstr   Cancer [facility]) 12/2020   left breast DCIS   Fibromyalgia    Hyperlipidemia    Hypothyroidism    Neuromuscular disorder ([place])    Osteoporosis    PONV (postoperative nausea and vomiting)    Thyroid disease    Past Surgical History:  Procedure Laterality Date   APPENDECTOMY     BREAST IMPLANT REMOVAL Right 02/11/2021   Procedure: REMOVAL RIGHT BREAST IMPLANT;  Surgeon: Michelle Limbo, MD;  Location: [place];  Service: Plastics;  Laterality: Right;   BREAST SURGERY Right 1990   Mastectomy   CAPSULECTOMY Right 02/11/2021   Procedure: CAPSULECTOMY;  Surgeon: Michelle Limbo, MD;  Location: [place];  Service: Plastics;  Laterality: Right;   FOOT SURGERY     LAPROSCOPIC     TOTAL MASTECTOMY Left 02/11/2021   Procedure: LEFT TOTAL MASTECTOMY;  Surgeon: Michelle Bookbinder, MD;  Location: [place];  Service: General;  Laterality: Left;   [place]     Patient Active Problem List   Diagnosis Date Noted   Breast cancer, left breast ([place]) 02/11/2021   Cough variant asthma with ? component upper airway cough syndrome 12/23/2018   Primary osteoarthritis of both hands 02/21/2016   Fibromyalgia 02/21/2016   History of hypothyroidism 02/21/2016   Dyslipidemia  02/21/2016   History of breast cancer 02/21/2016   Age-related osteoporosis without current pathological fracture 02/21/2016   Unspecified hypothyroidism 08/06/2013   Other and unspecified hyperlipidemia 08/06/2013   Insomnia 08/06/2013   Intrinsic asthma 08/06/2013   Hip pain, left 01/12/2011   Plantar fasciitis 01/12/2011    REFERRING DIAG: Left Breast [place]  THERAPY DIAG:  Intraductal carcinoma in situ of left breast  Aftercare following surgery for neoplasm  Localized edema  PERTINENT HISTORY: PERTINENT HISTORY: Pt has a history of right triple negative breast cancer treated in 1990 with a right mastectomy with implant reconstruction.  She had 14 LN's removed.  She is now s/p Left mastectomy with right implant removal on 02/11/2021 for left DCIS ER +, PR+. She will not require any further treatment and was told she may return to work activities  PRECAUTIONS: 14 LN's removed on right, [place]  SUBJECTIVE:  I did fine when I was here but I did the band exs 5 reps and my right shoulder hurt and both elbows a little. The little stitch redness is still there but its no worse. Went to work yesterday and that was fine.  PAIN: Are you having pain? No, just tightness    OBJECTIVE Taken at evaluation on 03/17/2021   UPPER EXTREMITY AROM/PROM:   A/PROM RIGHT  03/17/2021     Shoulder extension 58  Shoulder flexion 157 164  Shoulder abduction 140 147  Shoulder internal rotation 60   Shoulder external rotation 100                           (Blank rows = not tested)   A/PROM LEFT  03/17/2021   Shoulder extension 57   Shoulder flexion 153 156  Shoulder abduction 130 155  Shoulder internal rotation 60   Shoulder external rotation 88                           (Blank rows = not tested)     CERVICAL AROM: All within functional limits: limited most with SB         UPPER EXTREMITY STRENGTH: WFL         LYMPHEDEMA ASSESSMENTS:    SURGERY TYPE/DATE: 02/11/2021 Left breast  Cancer DCIS   NUMBER OF LYMPH NODES REMOVED: 0 on left, 14 in 1990 on right   CHEMOTHERAPY: no   RADIATION:no   HORMONE TREATMENT: no   INFECTIONS: no   LYMPHEDEMA ASSESSMENTS:  03/17/2021  LANDMARK RIGHT  03/17/2021  10 cm proximal to olecranon process 29.9  Olecranon process 23.4  10 cm proximal to ulnar styloid process 19.2  Just proximal to ulnar styloid process 14.3  Across hand at thumb web space 18.7  At base of 2nd digit 6.0  (Blank rows = not tested)   LANDMARK LEFT  03/17/2021  10 cm proximal to olecranon process 29.3  Olecranon process 24.4  10 cm proximal to ulnar styloid process 19.3  Just proximal to ulnar styloid process 14.1  Across hand at thumb web space 18.5  At base of 2nd digit 6.0  (Blank rows = not tested)                   QUICK DASH SURVEY:    Michelle Gray - 03/17/21 0001       Open a tight or new jar Mild difficulty     Do heavy household chores (wash walls, wash floors) Mild difficulty     Carry a shopping bag or briefcase No difficulty     Wash your back No difficulty     Use a knife to cut food No difficulty     During the past week, to what extent has your arm, shoulder or hand problem interfered with your normal social activities with family, friends, neighbors, or groups? Not at all     During the past week, to what extent has your arm, shoulder or hand problem limited your work or other regular daily activities Slightly     Arm, shoulder, or hand pain. None     Tingling (pins and needles) in your arm, shoulder, or hand None     Difficulty Sleeping No difficulty     DASH Score 4.55 %                     TODAY'S TREATMENT   04/01/2021 Pulleys 2 min flexion and abduction, 1 min scaption. Ball rolls forward and sideways x 10, LTR with goal post arms x 3 ea Soft tissue mobilization  to left pectorals, lats, bilateral UT/ Scar mobilization bilaterally PROM bilateral shoulder flexion, scaption, abd, ER  03/27/2021 Pulleys 2 min  flexion and abduction, 1 min scaption. Ball rolls forward and sideways x 10 Soft tissue mobilization  to left pectorals, lats, bilateral UT/ Scar  mobilization bilaterally PROM bilateral shoulder flexion, scaption, abd, ER Supine scapular series x 5 with yellow band  03/25/2021 Pulleys x 2 min flexion, scaption and abduction, ball rolls x 10 flexion, lower trunk rotation in goal post position x 3 each Soft tissue mobilization  to left pectorals, lats, bilateral UT/ Scar mobilization bilaterally PROM bilateral shoulder flexion, scaption, abd, ER     PATIENT EDUCATION: LTR with goal post arms   HOME EXERCISE PROGRAM: 4 post op exercises, supine wand flexion and scaption in place of clasped hands, supine scapular series with yellow   ASSESSMENT:   CLINICAL IMPRESSION: Performed AAROM activities with pulleys and ball, followed by soft tissue and scar mobilization and PROM.  Pt has made some good improvements in ROM for bilateral shoulders. Tightness/tenderness still noted bilateral pectorals.  Held theraband exercises due to shoulder and elbow pain after pt did at home.  Retry in therapy on Thursday REHAB POTENTIAL: Excellent   CLINICAL DECISION MAKING: Stable/uncomplicated   EVALUATION COMPLEXITY: Low     GOALS: Goals reviewed with patient? Yes   SHORT TERM GOALS:   STG Name Target Date Goal status  1 Pt will be independent in a HEP for bilateral shoulder ROM and strengthening Baseline:  04/07/2021 04/01/2021 achieved                                                          LONG TERM GOALS:    LTG Name Target Date Goal status  1 Pts bilateral shoulder abduction will be increased to atleast 160 degrees Baseline: 04/28/2021 INITIAL  2 Pt will be independent in self MLD for chest swelling Baseline: 04/28/2021 INITIAL  3 Pt will attend ABC class Baseline: 04/28/2021 INITIAL  4 Pts left mastectomy scar will flatten out and will have no lateral dimpling Baseline: 04/28/2021 INITIAL   5        6        7           PLAN: PT FREQUENCY: 1-2x/week   PT DURATION: 6 weeks   PLANNED INTERVENTIONS: Therapeutic exercises, Therapeutic activity, Neuro Muscular re-education, Patient/Family education, Manual lymph drainage, and Manual therapy   PLAN FOR NEXT SESSION: check stitch abcess area, STM prn pecs, lats,UT, scar mobilization,PROM prn, MLD to lateral trunk? chest areas of swelling prn        Claris Pong, PT 04/01/2021, 10:57 AM

## 2021-04-03 ENCOUNTER — Other Ambulatory Visit: Payer: Self-pay | Admitting: Rheumatology

## 2021-04-03 NOTE — Telephone Encounter (Signed)
Next Visit: 09/23/2021 ? ?Last Visit: 03/25/2021 ? ?Last Fill: 01/10/2021 ? ?Dx: Fibromyalgia  ? ?Current Dose per office note on 03/25/2021: not discussed ? ?Okay to refill Gabapentin?   ?

## 2021-04-04 DIAGNOSIS — E7800 Pure hypercholesterolemia, unspecified: Secondary | ICD-10-CM | POA: Diagnosis not present

## 2021-04-06 NOTE — Progress Notes (Signed)
Cardiology Office Note:    Date:  04/09/2021   ID:  Michelle Gray, DOB 08/04/1955, MRN [id number]  PCP:  Cari Caraway, MD  Cardiologist:  Donato Heinz, MD  Electrophysiologist:  None   Referring MD: Cari Caraway, MD   No chief complaint on file.   History of Present Illness:    Michelle Gray is a 66 y.o. female with a hx of fibromyalgia, asthma, hyperlipidemia, breast cancer, hypothyroidism who returns for follow-up.  She was initially seen on 12/08/2018, she was referred by Dr. Addison Lank for evaluation of palpitations and chest pain.  She states that during episodes of palpitations she does not feel that her heart is racing but feels that her heart beat is irregular.  No lightheadedness or syncope during episodes.  She also reports that she had been having chest pressure.  Describes as pressure in center of her chest.  Has not noted relationship with exertion.  She has no smoking history.  Mother had atrial fibrillation.  No other family history of heart disease.  TSH was 0.93 on 10/23.  TTE was done on 12/20/2018, which showed normal LV systolic function, normal RV function, no significant valvular disease.  Cardiac monitor showed no significant arrhythmias, with the patient triggered events corresponding to sinus rhythm plus or minus PACs.  Coronary CTA was done on 01/14/2019, which showed nonobstructive CAD with calcified plaque in the proximal LAD causing minimal (0-24%) stenosis and noncalcified plaque in the proximal RCA causing minimal (0-24%) stenosis.  Calcium score was 32 (73rd percentile for age/gender).  Rosuvastatin was increased to 40 mg daily.   Since last clinic visit, she reports that she is doing well.  She was recently diagnosed with left breast DCIS, underwent mastectomy.  Recovering well.  She has cut her work schedule to 3 half days/week.  Denies any chest pain, dyspnea, lightheadedness, syncope, or lower extremity edema.  Reports rare palpitations.  Not  exercising regularly but planning on starting to use stationary bike.    Past Medical History:  Diagnosis Date   Arthritis    Asthma    Cancer [facility]) 1990   right breast ca-mast with reconstr   Cancer [facility]) 12/2020   left breast DCIS   Fibromyalgia    Hyperlipidemia    Hypothyroidism    Neuromuscular disorder ([place])    Osteoporosis    PONV (postoperative nausea and vomiting)    Thyroid disease     Past Surgical History:  Procedure Laterality Date   APPENDECTOMY     BREAST IMPLANT REMOVAL Right 02/11/2021   Procedure: REMOVAL RIGHT BREAST IMPLANT;  Surgeon: Irene Limbo, MD;  Location: [place];  Service: Plastics;  Laterality: Right;   BREAST SURGERY Right 1990   Mastectomy   CAPSULECTOMY Right 02/11/2021   Procedure: CAPSULECTOMY;  Surgeon: Irene Limbo, MD;  Location: [place];  Service: Plastics;  Laterality: Right;   FOOT SURGERY     LAPROSCOPIC     TOTAL MASTECTOMY Left 02/11/2021   Procedure: LEFT TOTAL MASTECTOMY;  Surgeon: Rolm Bookbinder, MD;  Location: [place];  Service: General;  Laterality: Left;   WISDOM TEETH REMOVAL      Current Medications: Current Meds  Medication Sig   5-HTP CAPS Take 2 tablets by mouth at bedtime.   albuterol (PROVENTIL HFA;VENTOLIN HFA) 108 (90 BASE) MCG/ACT inhaler Inhale 2 puffs into the lungs every 6 (six) hours as needed for wheezing or shortness of breath.   Biotin 5000 MCG CAPS  Take 5,000 mcg by mouth daily.   Cholecalciferol (VITAMIN D3) 5000 units CAPS Take 7,000 Units by mouth once a week.   Coenzyme Q10 (COQ-10) 100 MG CAPS    gabapentin (NEURONTIN) 300 MG capsule TAKE TWO CAPSULES BY MOUTH EVERY NIGHT AT BEDTIME   Glucosamine 500 MG TABS Take 1,500 mg by mouth daily.   levothyroxine (SYNTHROID, LEVOTHROID) 50 MCG tablet Take 50 mcg by mouth every other day.   levothyroxine (SYNTHROID, LEVOTHROID) 75 MCG tablet Take 75 mcg by mouth every other day.   Magnesium  500 MG CAPS Take 500 mg by mouth daily.   meloxicam (MOBIC) 15 MG tablet Take 1 tablet (15 mg total) by mouth daily.   methocarbamol (ROBAXIN) 750 MG tablet Take 1 tablet (750 mg total) by mouth at bedtime as needed for muscle spasms.   Misc Natural Products (TART CHERRY ADVANCED PO) Take by mouth.   rosuvastatin (CRESTOR) 20 MG tablet Take 1 tablet (20 mg total) by mouth daily.   SYMBICORT 160-4.5 MCG/ACT inhaler Take 2 puffs first thing in am and then another 2 puffs about 12 hours later.   TURMERIC PO Take 1 tablet by mouth daily.   zolpidem (AMBIEN) 10 MG tablet Take 1 tablet (10 mg total) by mouth at bedtime.     Allergies:   Compazine and Tape   Social History   Socioeconomic History   Marital status: Married    Spouse name: Not on file   Number of children: Not on file   Years of education: Not on file   Highest education level: Not on file  Occupational History   Not on file  Tobacco Use   Smoking status: Never    Passive exposure: Past   Smokeless tobacco: Never  Vaping Use   Vaping Use: Never used  Substance and Sexual Activity   Alcohol use: Yes    Comment: social   Drug use: Never   Sexual activity: Not on file  Other Topics Concern   Not on file  Social History Narrative   Not on file   Social Determinants of Health   Financial Resource Strain: Not on file  Food Insecurity: Not on file  Transportation Needs: Not on file  Physical Activity: Not on file  Stress: Not on file  Social Connections: Not on file     Family History: The patient's family history includes Cancer in her mother; Cancer (age of onset: 70) in her father; Dementia in her mother; Hyperlipidemia in her father and mother; Hypertension in her brother and mother; Prostate cancer in her father; Stroke in her father and mother.  ROS:   Please see the history of present illness.    All other systems reviewed and are negative.  EKGs/Labs/Other Studies Reviewed:    The following studies  were reviewed today:   EKG:   04/09/21: NSR with first degree AV block, LAD, Q waves in V1-3 (old)   Recent Labs: No results found for requested labs within last 8760 hours.  Recent Lipid Panel No results found for: CHOL, TRIG, HDL, CHOLHDL, VLDL, LDLCALC, LDLDIRECT   TTE 12/20/18:  1. Left ventricular ejection fraction, by visual estimation, is 60 to  65%. The left ventricle has normal function. There is no left ventricular  hypertrophy. Normal diastolic function.   2. Global right ventricle has normal systolic function.The right  ventricular size is normal. No increase in right ventricular wall  thickness.   3. Left atrial size was normal.   4. Right  atrial size was normal.   5. Moderate thickening of the mitral valve leaflet(s).   6. The mitral valve is normal in structure. Trace mitral valve  regurgitation.   7. The tricuspid valve is normal in structure. Tricuspid valve  regurgitation is trivial.   8. The aortic valve was not well visualized. Aortic valve regurgitation  is not visualized. No evidence of aortic valve sclerosis or stenosis.   9. The pulmonic valve was not well visualized. Pulmonic valve  regurgitation is not visualized.  10. Mildly elevated pulmonary artery systolic pressure.  11. The inferior vena cava is normal in size with greater than 50%  respiratory variability, suggesting right atrial pressure of 3 mmHg.  12. The tricuspid regurgitant velocity is 2.31 m/s, and with an assumed  right atrial pressure of 10 mmHg, the estimated right ventricular systolic  pressure is mildly elevated at 31.3 mmHg.   CTA 01/14/19: 1. Coronary calcium score of 32. This was 39 percentile for age and sex matched control. 2.  Normal coronary origin with right dominance. 3. Nonobstructive CAD, with calcified plaque in the proximal LAD causing minimal (0-24%) stenosis and noncalcified plaque in the proximal RCA causing minimal (0-24%) stenosis   CAD-RADS 1. Minimal  non-obstructive CAD (0-24%). Consider non-atherosclerotic causes of chest pain. Consider preventive therapy and risk factor modification.  Noncardiac: IMPRESSION: 1.  Aortic Atherosclerosis (ICD10-I70.0).  Cardiac monitor 02/16/19: No significant arrhythmias Patient triggered events corresponded to sinus rhythm +/- PACs   Predominant rhythm is sinus rhythm. Range is 58-142 bpm with average of 78 bpm. No atrial fibrillation, sustained ventricular tachycardia, significant pause, or high degree AV block. Total ectopy <1%. 20 patient triggered events, corresponding to sinus rhythm  +/- PACs  No significant abnormalities.  Physical Exam:    VS:  BP 116/74    Pulse 78    Ht '5\' 7"'$  (1.702 m)    Wt 156 lb 12.8 oz (71.1 kg)    SpO2 98%    BMI 24.56 kg/m     Wt Readings from Last 3 Encounters:  04/09/21 156 lb 12.8 oz (71.1 kg)  03/25/21 160 lb 6.4 oz (72.8 kg)  02/11/21 156 lb 1.4 oz (70.8 kg)     GEN:  Well nourished, well developed in no acute distress HEENT: Normal NECK: No JVD; No carotid bruits CARDIAC: RRR, no murmurs, rubs, gallops RESPIRATORY:  Expiratory wheezing ABDOMEN: Soft, non-tender, non-distended MUSCULOSKELETAL:  No edema; No deformity  SKIN: Warm and dry NEUROLOGIC:  Alert and oriented x 3 PSYCHIATRIC:  Normal affect   ASSESSMENT:    1. Coronary artery disease involving native coronary artery of native heart without angina pectoris   2. Chest pain of uncertain etiology   3. Palpitations   4. Hyperlipidemia, unspecified hyperlipidemia type     PLAN:     Coronary artery disease: Coronary CTA on 01/14/2019  showed nonobstructive CAD with calcified plaque in the proximal LAD causing minimal (0-24%) stenosis and noncalcified plaque in the proximal RCA causing minimal (0-24%) stenosis.  Calcium score was 32 (73rd percentile for age/gender) -Rosuvastatin increased to 40 mg daily, developed myalgias, so was decreased to 20 mg daily and Zetia 10 mg daily was added.  LDL  72 on 04/04/21. Continue rosuvastatin and Zetia.  Chest pain: Atypical in description, as describes substernal chest pressure but not related to exertion.  Coronary CTA showed nonobstructive CAD.  Reports chest pain has resolved.  Palpitations: Cardiac monitor showed symptoms corresponding to PACs.  Reports no recent palpitations.  Suspect  driven by stress.  No indication for treatment at this time  Hyperlipidemia: Continue rosuvastatin 20 mg daily and Zetia 10 mg daily,  LDL 72 on 04/04/21  RTC in 1 year   Medication Adjustments/Labs and Tests Ordered: Current medicines are reviewed at length with the patient today.  Concerns regarding medicines are outlined above.  Orders Placed This Encounter  Procedures   EKG 12-Lead   No orders of the defined types were placed in this encounter.   Patient Instructions  Medication Instructions:  Your physician recommends that you continue on your current medications as directed. Please refer to the Current Medication list given to you today.  *If you need a refill on your cardiac medications before your next appointment, please call your pharmacy*  Follow-Up: At [facility], you and your health needs are our priority.  As part of our continuing mission to provide you with exceptional heart care, we have created designated Provider Care Teams.  These Care Teams include your primary Cardiologist (physician) and Advanced Practice Providers (APPs -  Physician Assistants and Nurse Practitioners) who all work together to provide you with the care you need, when you need it.  We recommend signing up for the patient portal called "MyChart".  Sign up information is provided on this After Visit Summary.  MyChart is used to connect with patients for Virtual Visits (Telemedicine).  Patients are able to view lab/test results, encounter notes, upcoming appointments, etc.  Non-urgent messages can be sent to your provider as well.   To learn more about what you can  do with MyChart, go to [email or web address].    Your next appointment:   12 month(s)  The format for your next appointment:   In Person  Provider:   Donato Heinz, MD       Signed, Donato Heinz, MD  04/09/2021 8:33 AM    [place]

## 2021-04-08 ENCOUNTER — Other Ambulatory Visit: Payer: Self-pay

## 2021-04-08 ENCOUNTER — Ambulatory Visit: Payer: Medicare Other | Attending: General Surgery

## 2021-04-08 DIAGNOSIS — Z483 Aftercare following surgery for neoplasm: Secondary | ICD-10-CM | POA: Insufficient documentation

## 2021-04-08 DIAGNOSIS — D0512 Intraductal carcinoma in situ of left breast: Secondary | ICD-10-CM | POA: Insufficient documentation

## 2021-04-08 DIAGNOSIS — R600 Localized edema: Secondary | ICD-10-CM | POA: Diagnosis not present

## 2021-04-08 NOTE — Therapy (Signed)
OUTPATIENT PHYSICAL THERAPY TREATMENT NOTE   Patient Name: Michelle Gray MRN: [id number] DOB:[birth year], 66 y.o., female Today's Date: 04/08/2021  PCP: Cari Caraway, MD REFERRING PROVIDER:Wakefield,Mathew MD   PT End of Session - 04/08/21 1300     Visit Number 5    Number of Visits 12    Date for PT Re-Evaluation 04/28/21    PT Start Time 1301    PT Stop Time 1352    PT Time Calculation (min) 51 min             Past Medical History:  Diagnosis Date   Arthritis    Asthma    Cancer ([place]) 1990   right breast ca-mast with reconstr   Cancer [facility]) 12/2020   left breast DCIS   Fibromyalgia    Hyperlipidemia    Hypothyroidism    Neuromuscular disorder ([place])    Osteoporosis    PONV (postoperative nausea and vomiting)    Thyroid disease    Past Surgical History:  Procedure Laterality Date   APPENDECTOMY     BREAST IMPLANT REMOVAL Right 02/11/2021   Procedure: REMOVAL RIGHT BREAST IMPLANT;  Surgeon: Irene Limbo, MD;  Location: [place];  Service: Plastics;  Laterality: Right;   BREAST SURGERY Right 1990   Mastectomy   CAPSULECTOMY Right 02/11/2021   Procedure: CAPSULECTOMY;  Surgeon: Irene Limbo, MD;  Location: [place];  Service: Plastics;  Laterality: Right;   FOOT SURGERY     LAPROSCOPIC     TOTAL MASTECTOMY Left 02/11/2021   Procedure: LEFT TOTAL MASTECTOMY;  Surgeon: Rolm Bookbinder, MD;  Location: [place];  Service: General;  Laterality: Left;   [place]     Patient Active Problem List   Diagnosis Date Noted   Breast cancer, left breast ([place]) 02/11/2021   Cough variant asthma with ? component upper airway cough syndrome 12/23/2018   Primary osteoarthritis of both hands 02/21/2016   Fibromyalgia 02/21/2016   History of hypothyroidism 02/21/2016   Dyslipidemia 02/21/2016   History of breast cancer 02/21/2016   Age-related osteoporosis without current pathological fracture  02/21/2016   Unspecified hypothyroidism 08/06/2013   Other and unspecified hyperlipidemia 08/06/2013   Insomnia 08/06/2013   Intrinsic asthma 08/06/2013   Hip pain, left 01/12/2011   Plantar fasciitis 01/12/2011    REFERRING DIAG: Left Breast Cander  THERAPY DIAG:  Intraductal carcinoma in situ of left breast  Aftercare following surgery for neoplasm  Localized edema  PERTINENT HISTORY: PERTINENT HISTORY: Pt has a history of right triple negative breast cancer treated in 1990 with a right mastectomy with implant reconstruction.  She had 14 LN's removed.  She is now s/p Left mastectomy with right implant removal on 02/11/2021 for left DCIS ER +, PR+. She will not require any further treatment and was told she may return to work activities  PRECAUTIONS: 14 LN's removed on right, Raynauds  SUBJECTIVE:   PAIN: My elbow tendinitis has flared up again. I can't do anything right now with my arms but I can do my stretches. Work is going well and it doesn't bother my elbows. Pt doesn't feel she needs to do the ABC class. The stitch area still looks exactly the same. The incision area is just a little tight.  Pain today No, just tightness and some tenderness at left chest area.    OBJECTIVE Taken at evaluation on 03/17/2021   UPPER EXTREMITY AROM/PROM:   A/PROM RIGHT  03/17/2021   03/27/2021 04/08/2021  Shoulder  extension 58    Shoulder flexion 157 164 160  Shoulder abduction 140 147 157  Shoulder internal rotation 60    Shoulder external rotation 100  105                          (Blank rows = not tested)   A/PROM LEFT  03/17/2021    Shoulder extension 57    Shoulder flexion 153 156 157  Shoulder abduction 130 155 162  Shoulder internal rotation 60    Shoulder external rotation 88  97                          (Blank rows = not tested)     CERVICAL AROM: All within functional limits: limited most with SB         UPPER EXTREMITY STRENGTH: WFL         LYMPHEDEMA  ASSESSMENTS:    SURGERY TYPE/DATE: 02/11/2021 Left breast Cancer DCIS   NUMBER OF LYMPH NODES REMOVED: 0 on left, 14 in 1990 on right   CHEMOTHERAPY: no   RADIATION:no   HORMONE TREATMENT: no   INFECTIONS: no   LYMPHEDEMA ASSESSMENTS:  03/17/2021  [place] RIGHT  03/17/2021  10 cm proximal to olecranon process 29.9  Olecranon process 23.4  10 cm proximal to ulnar styloid process 19.2  Just proximal to ulnar styloid process 14.3  Across hand at thumb web space 18.7  At base of 2nd digit 6.0  (Blank rows = not tested)   LANDMARK LEFT  03/17/2021  10 cm proximal to olecranon process 29.3  Olecranon process 24.4  10 cm proximal to ulnar styloid process 19.3  Just proximal to ulnar styloid process 14.1  Across hand at thumb web space 18.5  At base of 2nd digit 6.0  (Blank rows = not tested)                   QUICK DASH SURVEY:    Michelle Gray - 03/17/21 0001       Open a tight or new jar Mild difficulty     Do heavy household chores (wash walls, wash floors) Mild difficulty     Carry a shopping bag or briefcase No difficulty     Wash your back No difficulty     Use a knife to cut food No difficulty     During the past week, to what extent has your arm, shoulder or hand problem interfered with your normal social activities with family, friends, neighbors, or groups? Not at all     During the past week, to what extent has your arm, shoulder or hand problem limited your work or other regular daily activities Slightly     Arm, shoulder, or hand pain. None     Tingling (pins and needles) in your arm, shoulder, or hand None     Difficulty Sleeping No difficulty     DASH Score 4.55 %                     TODAY'S TREATMENT  04/08/2021 Performed scar mobilization and MFR to left chest area. Performed MLD to left chest/trunk area that was tender and appeared swollen. Activated neck LN's, left axillary and inguinal LN's, left axillo-inguinal pathway and left chest redirected  towards pathway, in supine and SL, retracing steps , then returned to supine and had pt perform each technique. She did very well  using good form and technique.  04/01/2021 Pulleys 2 min flexion and abduction, 1 min scaption. Ball rolls forward and sideways x 10, LTR with goal post arms x 3 ea Soft tissue mobilization  to left pectorals, lats, bilateral UT/ Scar mobilization bilaterally PROM bilateral shoulder flexion, scaption, abd, ER  03/27/2021 Pulleys 2 min flexion and abduction, 1 min scaption. Ball rolls forward and sideways x 10 Soft tissue mobilization  to left pectorals, lats, bilateral UT/ Scar mobilization bilaterally PROM bilateral shoulder flexion, scaption, abd, ER Supine scapular series x 5 with yellow band  03/25/2021 Pulleys x 2 min flexion, scaption and abduction, ball rolls x 10 flexion, lower trunk rotation in goal post position x 3 each Soft tissue mobilization  to left pectorals, lats, bilateral UT/ Scar mobilization bilaterally PROM bilateral shoulder flexion, scaption, abd, ER     PATIENT EDUCATION: LTR with goal post arms   HOME EXERCISE PROGRAM: 4 post op exercises, supine wand flexion and scaption in place of clasped hands, supine scapular series with yellow   ASSESSMENT:   CLINICAL IMPRESSION: Performed MFR and scar mobilization to left chest incision. Initiated MLD to left later chest area secondary to appearance of mild swelling and pt tenderness, and instructed pt in same.  She did very well with techniques and felt slightly better after treatment. Held pulleys, band exs secondary to elbow pain. Good improvement in shoulder ROM. Pt will take 2 weeks off and return for last visit on 04/24/2021. She will call to cancel if she doesn't feel she needs it. REHAB POTENTIAL: Excellent   CLINICAL DECISION MAKING: Stable/uncomplicated   EVALUATION COMPLEXITY: Low     GOALS: Goals reviewed with patient? Yes   SHORT TERM GOALS:   STG Name Target Date Goal  status  1 Pt will be independent in a HEP for bilateral shoulder ROM and strengthening Baseline:  04/07/2021 04/01/2021 achieved                                                          LONG TERM GOALS:    LTG Name Target Date Goal status  1 Pts bilateral shoulder abduction will be increased to atleast 160 degrees Baseline: 04/28/2021 04/08/2021 Partially achieved  2 Pt will be independent in self MLD for chest swelling Baseline: 04/28/2021 04/08/2021 Achieved  3 Pt will attend ABC class Baseline: 04/28/2021 INITIAL Deferred;pt does feel she needs to do this  4 Pts left mastectomy scar will flatten out and will have no lateral dimpling Baseline: 04/28/2021 04/08/2021 Nearly achieved;mild dimple laterally  '5        6        7          '$ PLAN: PT FREQUENCY: 1-2x/week   PT DURATION: 6 weeks   PLANNED INTERVENTIONS: Therapeutic exercises, Therapeutic activity, Neuro Muscular re-education, Patient/Family education, Manual lymph drainage, and Manual therapy   PLAN FOR NEXT SESSION: check stitch abcess area, STM prn pecs, lats,UT, scar mobilization,PROM prn, review MLD to lateral trunk? chest areas of swelling prn        Claris Pong, PT 04/08/2021, 1:55 PM

## 2021-04-09 ENCOUNTER — Ambulatory Visit (INDEPENDENT_AMBULATORY_CARE_PROVIDER_SITE_OTHER): Payer: Medicare Other | Admitting: Cardiology

## 2021-04-09 ENCOUNTER — Other Ambulatory Visit: Payer: Self-pay | Admitting: *Deleted

## 2021-04-09 ENCOUNTER — Encounter: Payer: Self-pay | Admitting: Cardiology

## 2021-04-09 VITALS — BP 116/74 | HR 78 | Ht 67.0 in | Wt 156.8 lb

## 2021-04-09 DIAGNOSIS — R079 Chest pain, unspecified: Secondary | ICD-10-CM

## 2021-04-09 DIAGNOSIS — E785 Hyperlipidemia, unspecified: Secondary | ICD-10-CM

## 2021-04-09 DIAGNOSIS — I2510 Atherosclerotic heart disease of native coronary artery without angina pectoris: Secondary | ICD-10-CM

## 2021-04-09 DIAGNOSIS — R002 Palpitations: Secondary | ICD-10-CM

## 2021-04-09 MED ORDER — EZETIMIBE 10 MG PO TABS
10.0000 mg | ORAL_TABLET | Freq: Every day | ORAL | 3 refills | Status: DC
Start: 2021-04-09 — End: 2022-05-20

## 2021-04-09 MED ORDER — ROSUVASTATIN CALCIUM 20 MG PO TABS: 20.0000 mg | ORAL_TABLET | Freq: Every day | ORAL | 3 refills | Status: DC

## 2021-04-09 NOTE — Patient Instructions (Signed)
Medication Instructions:  ?Your physician recommends that you continue on your current medications as directed. Please refer to the Current Medication list given to you today. ? ?*If you need a refill on your cardiac medications before your next appointment, please call your pharmacy* ? ?Follow-Up: ?At [facility], you and your health needs are our priority.  As part of our continuing mission to provide you with exceptional heart care, we have created designated Provider Care Teams.  These Care Teams include your primary Cardiologist (physician) and Advanced Practice Providers (APPs -  Physician Assistants and Nurse Practitioners) who all work together to provide you with the care you need, when you need it. ? ?We recommend signing up for the patient portal called "MyChart".  Sign up information is provided on this After Visit Summary.  MyChart is used to connect with patients for Virtual Visits (Telemedicine).  Patients are able to view lab/test results, encounter notes, upcoming appointments, etc.  Non-urgent messages can be sent to your provider as well.   ?To learn more about what you can do with MyChart, go to [email or web address].   ? ?Your next appointment:   ?12 month(s) ? ?The format for your next appointment:   ?In Person ? ?Provider:   ?Donato Heinz, MD   ? ? ?

## 2021-04-21 DIAGNOSIS — J3489 Other specified disorders of nose and nasal sinuses: Secondary | ICD-10-CM | POA: Diagnosis not present

## 2021-04-24 ENCOUNTER — Other Ambulatory Visit: Payer: Self-pay

## 2021-04-24 ENCOUNTER — Ambulatory Visit: Payer: Medicare Other

## 2021-04-24 DIAGNOSIS — R600 Localized edema: Secondary | ICD-10-CM

## 2021-04-24 DIAGNOSIS — D0512 Intraductal carcinoma in situ of left breast: Secondary | ICD-10-CM | POA: Diagnosis not present

## 2021-04-24 DIAGNOSIS — Z483 Aftercare following surgery for neoplasm: Secondary | ICD-10-CM | POA: Diagnosis not present

## 2021-04-24 NOTE — Therapy (Signed)
?OUTPATIENT PHYSICAL THERAPY TREATMENT NOTE ? ? ?Patient Name: Michelle Gray ?MRN: [id number] ?DOB:1955-08-07, 66 y.o., female ?Today's Date: 04/24/2021 ? ?PCP: Cari Caraway, MD ?REFERRING PROVIDER:Wakefield,Mathew MD ? ? PT End of Session - 04/24/21 1254   ? ? Visit Number 6   ? Number of Visits 12   ? Date for PT Re-Evaluation 04/28/21   ? PT Start Time 1259   ? PT Stop Time 1348   ? PT Time Calculation (min) 49 min   ? Activity Tolerance Patient tolerated treatment well   ? Behavior During Therapy [facility] for tasks assessed/performed   ? ?  ?  ? ?  ? ? ?Past Medical History:  ?Diagnosis Date  ? Arthritis   ? Asthma   ? Cancer [facility]) 1990  ? right breast ca-mast with reconstr  ? Cancer ([place]) 12/2020  ? left breast DCIS  ? Fibromyalgia   ? Hyperlipidemia   ? Hypothyroidism   ? Neuromuscular disorder ([place])   ? Osteoporosis   ? PONV (postoperative nausea and vomiting)   ? Thyroid disease   ? ?Past Surgical History:  ?Procedure Laterality Date  ? APPENDECTOMY    ? BREAST IMPLANT REMOVAL Right 02/11/2021  ? Procedure: REMOVAL RIGHT BREAST IMPLANT;  Surgeon: Irene Limbo, MD;  Location: [place];  Service: Plastics;  Laterality: Right;  ? BREAST SURGERY Right 1990  ? Mastectomy  ? CAPSULECTOMY Right 02/11/2021  ? Procedure: CAPSULECTOMY;  Surgeon: Irene Limbo, MD;  Location: [place];  Service: Plastics;  Laterality: Right;  ? FOOT SURGERY    ? LAPROSCOPIC    ? TOTAL MASTECTOMY Left 02/11/2021  ? Procedure: LEFT TOTAL MASTECTOMY;  Surgeon: Rolm Bookbinder, MD;  Location: [place];  Service: General;  Laterality: Left;  ? WISDOM TEETH REMOVAL    ? ?Patient Active Problem List  ? Diagnosis Date Noted  ? Breast cancer, left breast ([place]) 02/11/2021  ? Cough variant asthma with ? component upper airway cough syndrome 12/23/2018  ? Primary osteoarthritis of both hands 02/21/2016  ? Fibromyalgia 02/21/2016  ? History of hypothyroidism 02/21/2016  ? Dyslipidemia  02/21/2016  ? History of breast cancer 02/21/2016  ? Age-related osteoporosis without current pathological fracture 02/21/2016  ? Unspecified hypothyroidism 08/06/2013  ? Other and unspecified hyperlipidemia 08/06/2013  ? Insomnia 08/06/2013  ? Intrinsic asthma 08/06/2013  ? Hip pain, left 01/12/2011  ? Plantar fasciitis 01/12/2011  ? ? ?REFERRING DIAG: Left Breast Cander ? ?THERAPY DIAG:  ?Intraductal carcinoma in situ of left breast ? ?Aftercare following surgery for neoplasm ? ?Localized edema ? ?PERTINENT HISTORY: PERTINENT HISTORY: Pt has a history of right triple negative breast cancer treated in 1990 with a right mastectomy with implant reconstruction.  She had 14 LN's removed.  She is now s/p Left mastectomy with right implant removal on 02/11/2021 for left DCIS ER +, PR+. She will not require any further treatment and was told she may return to work activities ? ?PRECAUTIONS: 14 LN's removed on right, Raynauds ? ?SUBJECTIVE:   ?Everything is about the same. Use Voltaren on my left every day. The stitch area is about the same.  I have done the MLD but it seems about the same. I think it is going to take several more months to go away. If I stretch my chest or do the MLD it always feels better ? ?PAIN: ? ? Pain today No, just tightness and some tenderness at left chest area. ? ? ? ?OBJECTIVE Taken at  evaluation on 03/17/2021 ?  ?UPPER EXTREMITY AROM/PROM: ?  ?A/PROM RIGHT  03/17/2021 ?  03/27/2021 04/08/2021   ?Shoulder extension 58     ?Shoulder flexion 157 164 160 164  ?Shoulder abduction 140 147 157 165  ?Shoulder internal rotation 60     ?Shoulder external rotation 100  105 105  ?                        (Blank rows = not tested) ?  ?A/PROM LEFT  03/17/2021     ?Shoulder extension 57     ?Shoulder flexion 153 156 157 164  ?Shoulder abduction 130 155 162 171  ?Shoulder internal rotation 60     ?Shoulder external rotation 88  97 103  ?                        (Blank rows = not tested) ?  ?  ?CERVICAL AROM: ?All  within functional limits: limited most with SB ?  ?  ?  ?  ?UPPER EXTREMITY STRENGTH: WFL ?  ?  ?  ?  ?LYMPHEDEMA ASSESSMENTS:  ?  ?SURGERY TYPE/DATE: 02/11/2021 Left breast Cancer DCIS ?  ?NUMBER OF LYMPH NODES REMOVED: 0 on left, 14 in 1990 on right ?  ?CHEMOTHERAPY: no ?  ?RADIATION:no ?  ?HORMONE TREATMENT: no ?  ?INFECTIONS: no ?  ?LYMPHEDEMA ASSESSMENTS:  ?03/17/2021  ?[facility] RIGHT  03/17/2021 04/24/2021  ?10 cm proximal to olecranon process 29.9 30.0  ?Olecranon process 23.4 22.9  ?10 cm proximal to ulnar styloid process 19.2 18.9  ?Just proximal to ulnar styloid process 14.3 14.2  ?Across hand at thumb web space 18.7   ?At base of 2nd digit 6.0 6.1  ?(Blank rows = not tested) ?  ?[place] LEFT  03/17/2021  ?10 cm proximal to olecranon process 29.3  ?Olecranon process 24.4  ?10 cm proximal to ulnar styloid process 19.3  ?Just proximal to ulnar styloid process 14.1  ?Across hand at thumb web space 18.5  ?At base of 2nd digit 6.0  ?(Blank rows = not tested) ?  ?  ?  ?  ?  ?  ?  ?  ?  ?QUICK DASH SURVEY:  ?  [name] - 03/17/21 0001   ?  ?  Open a tight or new jar Mild difficulty   ?  Do heavy household chores (wash walls, wash floors) Mild difficulty   ?  Carry a shopping bag or briefcase No difficulty   ?  Wash your back No difficulty   ?  Use a knife to cut food No difficulty   ?  During the past week, to what extent has your arm, shoulder or hand problem interfered with your normal social activities with family, friends, neighbors, or groups? Not at all   ?  During the past week, to what extent has your arm, shoulder or hand problem limited your work or other regular daily activities Slightly   ?  Arm, shoulder, or hand pain. None   ?  Tingling (pins and needles) in your arm, shoulder, or hand None   ?  Difficulty Sleeping No difficulty   ?  DASH Score 4.55 %   ?  ?   ?  ?  ?   ?  ?  ?  ?TODAY'S TREATMENT  ?04/23/2021 ?Measured shoulder AROM, and right UE circumference with excellent results. Assessed  mastectomy incision with some keloid at medial incision but  stitch abcess no longer visible. Advised to consider silicone tape for scar area. ?Performed soft tissue mobilization to bilateral UT, left pectorals and lats, and MFR to left chest region. ? ? ? ?04/08/2021 ?Performed scar mobilization and MFR to left chest area. Performed MLD to left chest/trunk area that was tender and appeared swollen. Activated neck LN's, left axillary and inguinal LN's, left axillo-inguinal pathway and left chest redirected towards pathway, in supine and SL, retracing steps , then returned to supine and had pt perform each technique. She did very well using good form and technique. ? ?04/01/2021 ?Pulleys 2 min flexion and abduction, 1 min scaption. Ball rolls forward and sideways x 10, LTR with goal post arms x 3 ea ?Soft tissue mobilization  to left pectorals, lats, bilateral UT/ Scar mobilization bilaterally ?PROM bilateral shoulder flexion, scaption, abd, ER ? ?03/27/2021 ?Pulleys 2 min flexion and abduction, 1 min scaption. Ball rolls forward and sideways x 10 ?Soft tissue mobilization  to left pectorals, lats, bilateral UT/ Scar mobilization bilaterally ?PROM bilateral shoulder flexion, scaption, abd, ER ?Supine scapular series x 5 with yellow band ? ?03/25/2021 ?Pulleys x 2 min flexion, scaption and abduction, ball rolls x 10 flexion, lower trunk rotation in goal post position x 3 each ?Soft tissue mobilization  to left pectorals, lats, bilateral UT/ Scar mobilization bilaterally ?PROM bilateral shoulder flexion, scaption, abd, ER ? ? ?  ?PATIENT EDUCATION: LTR with goal post arms ?  ?HOME EXERCISE PROGRAM: ?4 post op exercises, supine wand flexion and scaption in place of clasped hands, supine scapular series with yellow ?  ?ASSESSMENT: ?  ?CLINICAL IMPRESSION:  ?Pt demonstrates excellent AROM of bilateral shoulders. There is no increase in circumferential measurements on the right side where she had 14 LN's removed. There is some  tightness in UT's and mild tightness in left pectorals today but otherwise she is doing very well.  She has achieved applicable goals and is being discharged ? ? ? ? ?REHAB POTENTIAL: Excellent ?  ?CLINICAL DECISION MAK

## 2021-05-05 DIAGNOSIS — D0512 Intraductal carcinoma in situ of left breast: Secondary | ICD-10-CM | POA: Diagnosis not present

## 2021-05-05 DIAGNOSIS — G4701 Insomnia due to medical condition: Secondary | ICD-10-CM | POA: Diagnosis not present

## 2021-05-05 DIAGNOSIS — M810 Age-related osteoporosis without current pathological fracture: Secondary | ICD-10-CM | POA: Diagnosis not present

## 2021-05-05 DIAGNOSIS — I2510 Atherosclerotic heart disease of native coronary artery without angina pectoris: Secondary | ICD-10-CM | POA: Diagnosis not present

## 2021-05-05 DIAGNOSIS — J3489 Other specified disorders of nose and nasal sinuses: Secondary | ICD-10-CM | POA: Diagnosis not present

## 2021-05-05 DIAGNOSIS — J45909 Unspecified asthma, uncomplicated: Secondary | ICD-10-CM | POA: Diagnosis not present

## 2021-05-05 DIAGNOSIS — E039 Hypothyroidism, unspecified: Secondary | ICD-10-CM | POA: Diagnosis not present

## 2021-05-05 DIAGNOSIS — M797 Fibromyalgia: Secondary | ICD-10-CM | POA: Diagnosis not present

## 2021-05-05 DIAGNOSIS — E559 Vitamin D deficiency, unspecified: Secondary | ICD-10-CM | POA: Diagnosis not present

## 2021-05-05 DIAGNOSIS — E782 Mixed hyperlipidemia: Secondary | ICD-10-CM | POA: Diagnosis not present

## 2021-05-05 DIAGNOSIS — K219 Gastro-esophageal reflux disease without esophagitis: Secondary | ICD-10-CM | POA: Diagnosis not present

## 2021-05-07 ENCOUNTER — Other Ambulatory Visit: Payer: Self-pay | Admitting: Rheumatology

## 2021-05-07 MED ORDER — ZOLPIDEM TARTRATE 10 MG PO TABS: 10.0000 mg | ORAL_TABLET | Freq: Every day | ORAL | 2 refills | Status: DC

## 2021-05-07 NOTE — Telephone Encounter (Signed)
Next Visit: 09/23/2021 ?  ?Last Visit: 03/25/2021 ?  ?Last Fill: 01/10/2021 ? ?Dx: Primary insomnia  ? ?Current Dose per office note on 03/25/2021: Ambien 10 mg 1 tablet by mouth at bedtime for insomnia ? ?Okay to refill Ambien?  ?

## 2021-05-07 NOTE — Telephone Encounter (Signed)
Patient called the office requesting a refill of Ambien '10mg'$  to be sent to [facility].  ?

## 2021-05-28 DIAGNOSIS — H40053 Ocular hypertension, bilateral: Secondary | ICD-10-CM | POA: Diagnosis not present

## 2021-06-01 DIAGNOSIS — Z23 Encounter for immunization: Secondary | ICD-10-CM | POA: Diagnosis not present

## 2021-07-03 DIAGNOSIS — E039 Hypothyroidism, unspecified: Secondary | ICD-10-CM | POA: Diagnosis not present

## 2021-07-04 ENCOUNTER — Other Ambulatory Visit: Payer: Self-pay | Admitting: Rheumatology

## 2021-07-04 NOTE — Telephone Encounter (Signed)
Next Visit: 09/23/2021   Last Visit: 03/25/2021   Last Fill: 13/03/2021   Dx: Fibromyalgia    Current Dose per office note on 03/25/2021: not discussed   Okay to refill Gabapentin?

## 2021-07-16 DIAGNOSIS — M2022 Hallux rigidus, left foot: Secondary | ICD-10-CM | POA: Diagnosis not present

## 2021-07-29 ENCOUNTER — Other Ambulatory Visit: Payer: Self-pay | Admitting: Rheumatology

## 2021-08-21 DIAGNOSIS — D0512 Intraductal carcinoma in situ of left breast: Secondary | ICD-10-CM | POA: Diagnosis not present

## 2021-09-08 DIAGNOSIS — Z85828 Personal history of other malignant neoplasm of skin: Secondary | ICD-10-CM | POA: Diagnosis not present

## 2021-09-08 DIAGNOSIS — L2489 Irritant contact dermatitis due to other agents: Secondary | ICD-10-CM | POA: Diagnosis not present

## 2021-09-11 ENCOUNTER — Telehealth: Payer: Self-pay | Admitting: *Deleted

## 2021-09-11 NOTE — Telephone Encounter (Signed)
Received a fax from [place] about notification of concurrent use on CNS agents with Gabapentin.  Concurrent Therapy with Gabapentin and Ambien  Possible Risks: respiratory depression and sedation.   Reviewed by: Hazel Sams, PA-C  Recommendations: Make patient aware of the risks and should try taking the Ambien PRN.  Notified patient of the receipt of fax and the risks. Patient advised to try taking Ambien as needed. Patient states she "has been taking this medication for years and has never had any trouble." She states "I am unable to sleep with out it and I am well aware of the risks." Patient advised we have to reach out and make her aware of the risks and recommendations.

## 2021-09-18 DIAGNOSIS — D225 Melanocytic nevi of trunk: Secondary | ICD-10-CM | POA: Diagnosis not present

## 2021-09-18 DIAGNOSIS — L821 Other seborrheic keratosis: Secondary | ICD-10-CM | POA: Diagnosis not present

## 2021-09-18 DIAGNOSIS — L245 Irritant contact dermatitis due to other chemical products: Secondary | ICD-10-CM | POA: Diagnosis not present

## 2021-09-18 DIAGNOSIS — Z85828 Personal history of other malignant neoplasm of skin: Secondary | ICD-10-CM | POA: Diagnosis not present

## 2021-09-18 DIAGNOSIS — L814 Other melanin hyperpigmentation: Secondary | ICD-10-CM | POA: Diagnosis not present

## 2021-09-22 DIAGNOSIS — Z79899 Other long term (current) drug therapy: Secondary | ICD-10-CM | POA: Diagnosis not present

## 2021-09-22 DIAGNOSIS — E039 Hypothyroidism, unspecified: Secondary | ICD-10-CM | POA: Diagnosis not present

## 2021-09-22 NOTE — Progress Notes (Unsigned)
Office Visit Note  Patient: Michelle Gray             Date of Birth: Oct 07, 1955           MRN: [id number]             PCP: Cari Caraway, MD Referring: Cari Caraway, MD Visit Date: 09/24/2021 Occupation: '@GUAROCC'$ @  Subjective:  No chief complaint on file.   History of Present Illness: Michelle Gray is a 66 y.o. female ***history of osteoarthritis, degenerative disc disease, fibromyalgia and osteopenia.  Activities of Daily Living:  Patient reports morning stiffness for *** {minute/hour:19697}.   Patient {ACTIONS;DENIES/REPORTS:21021675::"Denies"} nocturnal pain.  Difficulty dressing/grooming: {ACTIONS;DENIES/REPORTS:21021675::"Denies"} Difficulty climbing stairs: {ACTIONS;DENIES/REPORTS:21021675::"Denies"} Difficulty getting out of chair: {ACTIONS;DENIES/REPORTS:21021675::"Denies"} Difficulty using hands for taps, buttons, cutlery, and/or writing: {ACTIONS;DENIES/REPORTS:21021675::"Denies"}  No Rheumatology ROS completed.   PMFS History:  Patient Active Problem List   Diagnosis Date Noted   Breast cancer, left breast ([place]) 02/11/2021   Cough variant asthma with ? component upper airway cough syndrome 12/23/2018   Primary osteoarthritis of both hands 02/21/2016   Fibromyalgia 02/21/2016   History of hypothyroidism 02/21/2016   Dyslipidemia 02/21/2016   History of breast cancer 02/21/2016   Age-related osteoporosis without current pathological fracture 02/21/2016   Unspecified hypothyroidism 08/06/2013   Other and unspecified hyperlipidemia 08/06/2013   Insomnia 08/06/2013   Intrinsic asthma 08/06/2013   Hip pain, left 01/12/2011   Plantar fasciitis 01/12/2011    Past Medical History:  Diagnosis Date   Arthritis    Asthma    Cancer ([place]) 1990   right breast ca-mast with reconstr   Cancer [facility] 12/2020   left breast DCIS   Fibromyalgia    Hyperlipidemia    Hypothyroidism    Neuromuscular disorder (HCC)    Osteoporosis    PONV (postoperative nausea and  vomiting)    Thyroid disease     Family History  Problem Relation Age of Onset   Cancer Mother    Hyperlipidemia Mother    Hypertension Mother    Stroke Mother    Dementia Mother    Hyperlipidemia Father    Stroke Father    Prostate cancer Father    Cancer Father 44       Prostate   Hypertension Brother    Past Surgical History:  Procedure Laterality Date   APPENDECTOMY     BREAST IMPLANT REMOVAL Right 02/11/2021   Procedure: REMOVAL RIGHT BREAST IMPLANT;  Surgeon: Irene Limbo, MD;  Location: [place];  Service: Plastics;  Laterality: Right;   BREAST SURGERY Right 1990   Mastectomy   CAPSULECTOMY Right 02/11/2021   Procedure: CAPSULECTOMY;  Surgeon: Irene Limbo, MD;  Location: [place];  Service: Plastics;  Laterality: Right;   FOOT SURGERY     LAPROSCOPIC     TOTAL MASTECTOMY Left 02/11/2021   Procedure: LEFT TOTAL MASTECTOMY;  Surgeon: Rolm Bookbinder, MD;  Location: [place];  Service: General;  Laterality: Left;   [place] History   Social History Narrative   Not on file   Immunization History  Administered Date(s) Administered   Influenza-Unspecified 10/04/2018   PFIZER(Purple Top)SARS-COV-2 Vaccination 01/31/2019, 02/21/2019, 11/04/2019, 05/18/2020     Objective: Vital Signs: There were no vitals taken for this visit.   Physical Exam   Musculoskeletal Exam: ***  CDAI Exam: CDAI Score: -- Patient Global: --; Provider Global: -- Swollen: --; Tender: -- Joint Exam 09/24/2021   No joint exam  has been documented for this visit   There is currently no information documented on the homunculus. Go to the Rheumatology activity and complete the homunculus joint exam.  Investigation: No additional findings.  Imaging: No results found.  Recent Labs: Lab Results  Component Value Date   WBC 16.8 (H) 08/06/2013   HGB 13.4 08/06/2013   PLT 391 08/06/2013    Speciality  Comments: No specialty comments available.  Procedures:  No procedures performed Allergies: Compazine and Tape   Assessment / Plan:     Visit Diagnoses: Raynaud's disease without gangrene  Primary osteoarthritis of both hands  Trochanteric bursitis of left hip  Plantar fasciitis  DDD (degenerative disc disease), lumbar  Osteopenia of multiple sites  Fibromyalgia  Primary insomnia  Other fatigue  History of hyperlipidemia  History of breast cancer  History of hypothyroidism  Orders: No orders of the defined types were placed in this encounter.  No orders of the defined types were placed in this encounter.   Face-to-face time spent with patient was *** minutes. Greater than 50% of time was spent in counseling and coordination of care.  Follow-Up Instructions: No follow-ups on file.   Bo Merino, MD  Note - This record has been created using Editor, commissioning.  Chart creation errors have been sought, but may not always  have been located. Such creation errors do not reflect on  the standard of medical care.

## 2021-09-23 ENCOUNTER — Ambulatory Visit: Payer: Medicare Other | Admitting: Rheumatology

## 2021-09-24 ENCOUNTER — Ambulatory Visit: Payer: 59 | Attending: Rheumatology | Admitting: Rheumatology

## 2021-09-24 ENCOUNTER — Encounter: Payer: Self-pay | Admitting: Rheumatology

## 2021-09-24 VITALS — BP 124/84 | HR 77 | Resp 16 | Ht 67.25 in | Wt 160.4 lb

## 2021-09-24 DIAGNOSIS — R5383 Other fatigue: Secondary | ICD-10-CM | POA: Diagnosis not present

## 2021-09-24 DIAGNOSIS — M8589 Other specified disorders of bone density and structure, multiple sites: Secondary | ICD-10-CM | POA: Diagnosis not present

## 2021-09-24 DIAGNOSIS — Z853 Personal history of malignant neoplasm of breast: Secondary | ICD-10-CM | POA: Diagnosis not present

## 2021-09-24 DIAGNOSIS — M19041 Primary osteoarthritis, right hand: Secondary | ICD-10-CM

## 2021-09-24 DIAGNOSIS — M5136 Other intervertebral disc degeneration, lumbar region: Secondary | ICD-10-CM

## 2021-09-24 DIAGNOSIS — M797 Fibromyalgia: Secondary | ICD-10-CM | POA: Diagnosis not present

## 2021-09-24 DIAGNOSIS — F5101 Primary insomnia: Secondary | ICD-10-CM

## 2021-09-24 DIAGNOSIS — M7062 Trochanteric bursitis, left hip: Secondary | ICD-10-CM

## 2021-09-24 DIAGNOSIS — I7300 Raynaud's syndrome without gangrene: Secondary | ICD-10-CM | POA: Diagnosis not present

## 2021-09-24 DIAGNOSIS — I2510 Atherosclerotic heart disease of native coronary artery without angina pectoris: Secondary | ICD-10-CM

## 2021-09-24 DIAGNOSIS — M722 Plantar fascial fibromatosis: Secondary | ICD-10-CM

## 2021-09-24 DIAGNOSIS — Z5181 Encounter for therapeutic drug level monitoring: Secondary | ICD-10-CM | POA: Diagnosis not present

## 2021-09-24 DIAGNOSIS — M19042 Primary osteoarthritis, left hand: Secondary | ICD-10-CM

## 2021-09-24 DIAGNOSIS — Z8639 Personal history of other endocrine, nutritional and metabolic disease: Secondary | ICD-10-CM | POA: Diagnosis not present

## 2021-10-02 ENCOUNTER — Other Ambulatory Visit: Payer: Self-pay | Admitting: Physician Assistant

## 2021-10-02 MED ORDER — GABAPENTIN 300 MG PO CAPS: 600.0000 mg | ORAL_CAPSULE | Freq: Every day | ORAL | 2 refills | Status: DC

## 2021-10-02 NOTE — Telephone Encounter (Addendum)
Next Visit: 03/23/2022  Last Visit: 09/24/2021  Last Fill: 03/25/2021 (Mobic) 07/04/2021 (Gabapentin)  DX: Fibromyalgia  Current Dose per office note 09/24/2021: Mobic not discussed, gabapentin 300 mg  Labs: 03/17/2021 BUN/Creat. Ratio 29, Albumin 4.9  Okay to refill Mobic?

## 2021-10-14 ENCOUNTER — Ambulatory Visit: Payer: BC Managed Care – PPO | Admitting: Internal Medicine

## 2021-10-19 NOTE — Progress Notes (Unsigned)
Michelle Gray, female    DOB: [birth year],     MRN: [id number]   Brief patient profile:  79 yowf never smoker/NP for [place]  with onset asthma around 1990 eval here around 2005 on prn saba rarely needed but esp in extremes of heat /cold but  not spring / fall and no assoc cough or need for maint  then around 2016 developed chest tightness and wheezing more of a chronic pattern improved p rx symb 80 2bid improved and rare saba then early 2020 noted more doe so around late Oct 2020 changed symb 160 and some better but still uncomfortable with deep breath so self referred back to pulmonary 12/22/2018 .     History of Present Illness  12/22/2018  Pulmonary/ 1st office eval/Michelle Gray  Chief Complaint  Patient presents with   Pulmonary Consult    Self referral- asthma- trouble taking a deep breath and wheezing. She is using her albuterol inhaler daily.   Dyspnea:  Ex bike not using and limited by fibromyalgia/ plantar fasciitis/ ok with yard work and steps / some chest discomfort midline with deep breath but not with ex Cough: not a lot but finds herself throat clearing x 30 years  " just her like her dad" no mucus production and only happens while awake  Sleep: no resp problem SABA use: p symb 160 x 2 6 am  Typically feels needs saba w/in a few hours  No nasal symptoms  rec Plan A = Automatic = Always=    symbicort 160 Take 2 puffs first thing in am and then another 2 puffs about 12 hours later.  Work on inhaler technique: Plan B = Backup (to supplement plan A, not to replace it) Only use your albuterol inhaler as a rescue medication  Pantoprazole (protonix) 40 mg   Take  30-60 min before first meal of the day and Pepcid (famotidine)  20 mg one @  After supper   GERD diet televisit  01/19/19 rec: Add gabapentin 300 mg every morning to see if helps the daytime cough     10/14/2020  f/u ov/Michelle Gray re: asthma   maint on symbicort 160   Chief Complaint  Patient presents with   Follow-up     Increased cough for the past month-occurs when she takes a deep breath. She has occ chest tightness.    Dyspnea:  no aerobics Cough: on deep insp, no ex/ dry x 4 weeks  Sleeping: slt elevation sleep number  SABA use: rarely / eg uri  02: none  Covid status:   vax x 4, never infected  Rec Restart protonix 40 mg Take 30-60 min before first meal of the day x at least a month to see what benefit if any it has on the cough Please schedule a follow up visit in 12  months but call sooner if needed    10/20/2021  f/u ov/Michelle Gray re: asthma/ maint on symbicort 160     Chief Complaint  Patient presents with   Follow-up    Doing well  Dyspnea:  very active but no aerobics  Cough: throat clearing/ better with robitussin   Sleeping: sleep number  SABA use: never  02: none      No obvious day to day or daytime variability or assoc excess/ purulent sputum or mucus plugs or hemoptysis or cp or chest tightness, subjective wheeze or overt sinus or hb symptoms.   Sleeping  without nocturnal  or early am exacerbation  of  respiratory  c/o's or need for noct saba. Also denies any obvious fluctuation of symptoms with weather or environmental changes or other aggravating or alleviating factors except as outlined above   No unusual exposure hx or h/o childhood pna/ asthma or knowledge of premature birth.  Current Allergies, Complete Past Medical History, Past Surgical History, Family History, and Social History were reviewed in [employer] record.  ROS  The following are not active complaints unless bolded Hoarseness, sore throat, dysphagia, dental problems, itching, sneezing,  nasal congestion or discharge of excess mucus or purulent secretions, ear ache,   fever, chills, sweats, unintended wt loss or wt gain, classically pleuritic or exertional cp,  orthopnea pnd or arm/hand swelling  or leg swelling, presyncope, palpitations, abdominal pain, anorexia, nausea, vomiting, diarrhea  or  change in bowel habits or change in bladder habits, change in stools or change in urine, dysuria, hematuria,  rash, arthralgias, visual complaints, headache, numbness, weakness or ataxia or problems with walking or coordination,  change in mood or  memory.        Current Meds  Medication Sig   5-HTP CAPS Take 2 tablets by mouth at bedtime.   albuterol (PROVENTIL HFA;VENTOLIN HFA) 108 (90 BASE) MCG/ACT inhaler Inhale 2 puffs into the lungs every 6 (six) hours as needed for wheezing or shortness of breath.   Biotin 5000 MCG CAPS Take 5,000 mcg by mouth daily.   Cholecalciferol (VITAMIN D3) 5000 units CAPS Take 7,000 Units by mouth once a week.   Coenzyme Q10 (COQ-10) 100 MG CAPS    gabapentin (NEURONTIN) 300 MG capsule Take 2 capsules (600 mg total) by mouth at bedtime.   Glucosamine 500 MG TABS Take 1,500 mg by mouth daily.   levothyroxine (SYNTHROID, LEVOTHROID) 50 MCG tablet 50 mcg 6 days weekly.   levothyroxine (SYNTHROID, LEVOTHROID) 75 MCG tablet Take 75 mcg by mouth once a week.   Magnesium 500 MG CAPS Take 500 mg by mouth daily.   meloxicam (MOBIC) 15 MG tablet TAKE ONE TABLET BY MOUTH DAILY   methocarbamol (ROBAXIN) 750 MG tablet Take 1 tablet (750 mg total) by mouth at bedtime as needed for muscle spasms.   Misc Natural Products (TART CHERRY ADVANCED PO) Take by mouth.   mometasone (ELOCON) 0.1 % cream SMARTSIG:1 Topical Daily   rosuvastatin (CRESTOR) 20 MG tablet Take 1 tablet (20 mg total) by mouth daily.   SYMBICORT 160-4.5 MCG/ACT inhaler Take 2 puffs first thing in am and then another 2 puffs about 12 hours later.   TURMERIC PO Take 1 tablet by mouth daily.   zolpidem (AMBIEN) 10 MG tablet TAKE ONE TABLET BY MOUTH AT BEDTIME                 Past Medical History:  Diagnosis Date   Anemia    Arthritis    Asthma    Cancer ([place])    Hyperlipidemia    Neuromuscular disorder (HCC)    Osteoporosis    Thyroid disease         Objective:     10/20/2021        163   10/14/2020        155   10/03/19 150 lb 6.4 oz (68.2 kg)  09/20/19 151 lb 9.6 oz (68.8 kg)  09/18/19 153 lb 12.8 oz (69.8 kg)      Vital signs reviewed  10/20/2021  - Note at rest 02 sats  99% on RA   General appearance:  amb pleasant wf nad   HEENT : Oropharynx  clear          NECK :  without  apparent JVD/ palpable Nodes/TM    LUNGS: no acc muscle use,  Nl contour chest  with a few insp pops/ squeaks ant upper chest  bilaterally without cough on insp or exp maneuvers   CV:  RRR  no s3 or murmur or increase in P2, and no edema   ABD:  soft and nontender with nl inspiratory excursion in the supine position. No bruits or organomegaly appreciated   MS:  Nl gait/ ext warm without deformities Or obvious joint restrictions  calf tenderness, cyanosis or clubbing    SKIN: warm and dry without lesions    NEURO:  alert, approp, nl sensorium with  no motor or cerebellar deficits apparent.          Assessment

## 2021-10-20 ENCOUNTER — Ambulatory Visit (INDEPENDENT_AMBULATORY_CARE_PROVIDER_SITE_OTHER): Payer: Medicare Other | Admitting: Internal Medicine

## 2021-10-20 ENCOUNTER — Encounter: Payer: Self-pay | Admitting: Internal Medicine

## 2021-10-20 DIAGNOSIS — J45991 Cough variant asthma: Secondary | ICD-10-CM | POA: Diagnosis not present

## 2021-10-20 DIAGNOSIS — I2510 Atherosclerotic heart disease of native coronary artery without angina pectoris: Secondary | ICD-10-CM

## 2021-10-20 MED ORDER — BUDESONIDE-FORMOTEROL FUMARATE 80-4.5 MCG/ACT IN AERO: INHALATION_SPRAY | RESPIRATORY_TRACT | 12 refills | Status: DC

## 2021-10-20 NOTE — Assessment & Plan Note (Signed)
?  Onset 1990s - initial pulmonary eval 2005 [facility] (paper chart requested) - worse since 2018 rx symb 80 2bid and increased to 160 2bid  Oct 2020 - spacer added 12/22/2018 as has component of uacs > improved 01/19/2019 but not resolved - 01/19/2019  rec titrate gabapentin to max of 300 qid to see if helps> never needed  - flared off ppi > restart 10/14/2020   All goals of chronic asthma control met including optimal function and elimination of symptoms with minimal need for rescue therapy.  Contingencies discussed in full including contacting this office immediately if not controlling the symptoms using the rule of two's.     >>>try symb 80 2bid to see in now just as well and if not then restart there higher strength  >>>hard rock candies to stop the throat clearing   F/u yearly, call sooner prn         Each maintenance medication was reviewed in detail including emphasizing most importantly the difference between maintenance and prns and under what circumstances the prns are to be triggered using an action plan format where appropriate.  Total time for H and P, chart review, counseling, reviewing hfa  device(s) and generating customized AVS unique to this office visit / same day charting = 23 min

## 2021-10-20 NOTE — Patient Instructions (Signed)
Try symbicort 80 Take 2 puffs first thing in am and then another 2 puffs about 12 hours later.   Please schedule a follow up visit in 12  months but call sooner if needed

## 2021-10-26 ENCOUNTER — Other Ambulatory Visit: Payer: Self-pay | Admitting: Internal Medicine

## 2021-10-31 ENCOUNTER — Other Ambulatory Visit: Payer: Self-pay

## 2021-10-31 ENCOUNTER — Encounter: Payer: Self-pay | Admitting: Internal Medicine

## 2021-10-31 MED ORDER — BUDESONIDE-FORMOTEROL FUMARATE 160-4.5 MCG/ACT IN AERO: 2.0000 | INHALATION_SPRAY | Freq: Two times a day (BID) | RESPIRATORY_TRACT | 11 refills | Status: DC

## 2021-10-31 NOTE — Telephone Encounter (Signed)
Agree, Go back to symbicort 160 2bid x one year supply as advised last ov

## 2021-11-02 DIAGNOSIS — Z23 Encounter for immunization: Secondary | ICD-10-CM | POA: Diagnosis not present

## 2021-11-06 ENCOUNTER — Other Ambulatory Visit: Payer: Self-pay | Admitting: Rheumatology

## 2021-11-06 MED ORDER — ZOLPIDEM TARTRATE 10 MG PO TABS: 10.0000 mg | ORAL_TABLET | Freq: Every day | ORAL | 2 refills | Status: DC

## 2021-11-06 NOTE — Telephone Encounter (Signed)
Next Visit: 03/23/2022  Last Visit: 09/24/2021  Last Fill: 07/29/2021  Dx: Primary insomnia   Current Dose per office note on 09/24/2021: Ambien 10 mg p.o. nightly.  She is unable to sleep without Ambien.  Okay to refill Ambien?

## 2021-11-06 NOTE — Telephone Encounter (Signed)
Patient called the office resting a refill of Ambien '10mg'$  be sent to [facility] on [address].

## 2021-11-10 DIAGNOSIS — J45909 Unspecified asthma, uncomplicated: Secondary | ICD-10-CM | POA: Diagnosis not present

## 2021-11-10 DIAGNOSIS — I2510 Atherosclerotic heart disease of native coronary artery without angina pectoris: Secondary | ICD-10-CM | POA: Diagnosis not present

## 2021-11-10 DIAGNOSIS — Z6825 Body mass index (BMI) 25.0-25.9, adult: Secondary | ICD-10-CM | POA: Diagnosis not present

## 2021-11-10 DIAGNOSIS — E559 Vitamin D deficiency, unspecified: Secondary | ICD-10-CM | POA: Diagnosis not present

## 2021-11-10 DIAGNOSIS — E782 Mixed hyperlipidemia: Secondary | ICD-10-CM | POA: Diagnosis not present

## 2021-11-10 DIAGNOSIS — M797 Fibromyalgia: Secondary | ICD-10-CM | POA: Diagnosis not present

## 2021-11-10 DIAGNOSIS — M810 Age-related osteoporosis without current pathological fracture: Secondary | ICD-10-CM | POA: Diagnosis not present

## 2021-11-10 DIAGNOSIS — Z2911 Encounter for prophylactic immunotherapy for respiratory syncytial virus (RSV): Secondary | ICD-10-CM | POA: Diagnosis not present

## 2021-11-10 DIAGNOSIS — E039 Hypothyroidism, unspecified: Secondary | ICD-10-CM | POA: Diagnosis not present

## 2021-11-10 DIAGNOSIS — Z853 Personal history of malignant neoplasm of breast: Secondary | ICD-10-CM | POA: Diagnosis not present

## 2021-11-11 ENCOUNTER — Ambulatory Visit: Payer: Medicare Other | Attending: Rheumatology

## 2021-11-11 ENCOUNTER — Other Ambulatory Visit: Payer: Self-pay

## 2021-11-11 DIAGNOSIS — M5136 Other intervertebral disc degeneration, lumbar region: Secondary | ICD-10-CM | POA: Insufficient documentation

## 2021-11-11 DIAGNOSIS — M6281 Muscle weakness (generalized): Secondary | ICD-10-CM

## 2021-11-11 DIAGNOSIS — M25552 Pain in left hip: Secondary | ICD-10-CM

## 2021-11-11 DIAGNOSIS — R262 Difficulty in walking, not elsewhere classified: Secondary | ICD-10-CM

## 2021-11-11 DIAGNOSIS — M7062 Trochanteric bursitis, left hip: Secondary | ICD-10-CM | POA: Insufficient documentation

## 2021-11-11 DIAGNOSIS — R252 Cramp and spasm: Secondary | ICD-10-CM

## 2021-11-11 NOTE — Therapy (Signed)
OUTPATIENT PHYSICAL THERAPY LOWER EXTREMITY EVALUATION   Patient Name: Michelle Gray MRN: [id number] DOB:01-21-1956, 66 y.o., female Today's Date: 11/11/2021    Past Medical History:  Diagnosis Date   Arthritis    Asthma    Cancer [facility]) 1990   right breast ca-mast with reconstr   Cancer [facility]) 12/2020   left breast DCIS   Eczema    dx by derm per patient   Fibromyalgia    Hyperlipidemia    Hypothyroidism    Neuromuscular disorder ([place])    Osteoporosis    PONV (postoperative nausea and vomiting)    Thyroid disease    Past Surgical History:  Procedure Laterality Date   APPENDECTOMY     BREAST IMPLANT REMOVAL Right 02/11/2021   Procedure: REMOVAL RIGHT BREAST IMPLANT;  Surgeon: Irene Limbo, MD;  Location: [place];  Service: Plastics;  Laterality: Right;   BREAST SURGERY Right 1990   Mastectomy   CAPSULECTOMY Right 02/11/2021   Procedure: CAPSULECTOMY;  Surgeon: Irene Limbo, MD;  Location: [place];  Service: Plastics;  Laterality: Right;   FOOT SURGERY     LAPROSCOPIC     TOTAL MASTECTOMY Left 02/11/2021   Procedure: LEFT TOTAL MASTECTOMY;  Surgeon: Rolm Bookbinder, MD;  Location: [place];  Service: General;  Laterality: Left;   [place]     Patient Active Problem List   Diagnosis Date Noted   Breast cancer, left breast ([place]) 02/11/2021   Cough variant asthma with ? component upper airway cough syndrome 12/23/2018   Primary osteoarthritis of both hands 02/21/2016   Fibromyalgia 02/21/2016   History of hypothyroidism 02/21/2016   Dyslipidemia 02/21/2016   History of breast cancer 02/21/2016   Age-related osteoporosis without current pathological fracture 02/21/2016   Unspecified hypothyroidism 08/06/2013   Other and unspecified hyperlipidemia 08/06/2013   Insomnia 08/06/2013   Intrinsic asthma 08/06/2013   Hip pain, left 01/12/2011   Plantar fasciitis 01/12/2011    PCP: Cari Caraway, MD  REFERRING PROVIDER: Bo Merino, MD   REFERRING DIAG: M70.62 (ICD-10-CM) - Trochanteric bursitis of left hip M51.36 (ICD-10-CM) - DDD (degenerative disc disease), lumbar   THERAPY DIAG:  Muscle weakness (generalized)  Cramp and spasm  Difficulty in walking, not elsewhere classified  Pain in left hip  Rationale for Evaluation and Treatment Rehabilitation  ONSET DATE: 09/24/2021   SUBJECTIVE:   SUBJECTIVE STATEMENT: Patient reports long history of low back pain, now with left hip pain.  She is a Designer, jewellery working part time.  She is able to do all ADL's but has had to modify or have help with her outdoor work or higher level IADL's.   She had PT a while back which was effective but feels her condition may have progressed and she would like to be re-evaluated and see how she needs to modify her treatment and exercises.     PERTINENT HISTORY: See above.    PAIN:  Are you having pain? Yes: NPRS scale: 5/10 Pain location: Left piriformis area Pain description: aching, referring Aggravating factors: standing Relieving factors: walking, rest  PRECAUTIONS: None  WEIGHT BEARING RESTRICTIONS No  FALLS:  Has patient fallen in last 6 months? No  LIVING ENVIRONMENT: Lives with: lives with their spouse Lives in: House/apartment Stairs: Yes: Internal: 16 steps; on left going up Has following equipment at home: None  OCCUPATION: Tour manager  PLOF: Independent  PATIENT GOALS To be able to gain control of her symptoms again and know what  exercises are appropriate for her current condition.     OBJECTIVE:   DIAGNOSTIC FINDINGS: MRI indicates multi level thoracic and lumbar stenosis.   PATIENT SURVEYS:  FOTO do next visit.   COGNITION:  Overall cognitive status: Within functional limits for tasks assessed     SENSATION: WFL   MUSCLE LENGTH: Hamstrings: Right 70 deg; Left 60 deg Thomas test: Right pos ; Left pos   POSTURE: No  Significant postural limitations   LOWER EXTREMITY ROM:  WFL  LOWER EXTREMITY MMT:  Generally 4- to 4/5  LOWER EXTREMITY SPECIAL TESTS:  Hip special tests: Saralyn Pilar (FABER) test: negative and Ober's test: negative  FUNCTIONAL TESTS:  5 times sit to stand: 12.21 sec Timed up and go (TUG): 5.83 sec  GAIT: Distance walked: 50 Assistive device utilized: None Level of assistance: Complete Independence Comments: slightly antalgic    TODAY'S TREATMENT: 11/11/21: Initial eval completed and initiated HEP    PATIENT EDUCATION:  Education details: Initiated HEP Person educated: Patient Education method: Consulting civil engineer, Media planner, Verbal cues, and Handouts Education comprehension: verbalized understanding, returned demonstration, and verbal cues required   HOME EXERCISE PROGRAM: Access Code: [email or web address] URL: [email or web address] Date: 11/11/2021 Prepared by: Candyce Churn  Exercises - Standing Hamstring Stretch on Chair  - 1 x daily - 7 x weekly - 1 sets - 3 reps - 30 sec hold - Standing Quad Stretch with Rotation  - 1 x daily - 7 x weekly - 1 sets - 3 reps - 30 hold - Supine Pelvic Tilt  - 1 x daily - 7 x weekly - 3 sets - 10 reps - Supine Dead Bug with Leg Extension  - 1 x daily - 7 x weekly - 2 sets - 10 reps - Quadruped Fire Hydrant  - 1 x daily - 7 x weekly - 3 sets - 10 reps - Quadruped Bent Leg Hip Extension  - 1 x daily - 7 x weekly - 3 sets - 10 reps+++++++++  ASSESSMENT:  CLINICAL IMPRESSION: Patient is a 66 y.o. female who was seen today for physical therapy evaluation and treatment for Low back and left hip pain.  She presents with decreased ROM, strength and function along with elevated pain.     OBJECTIVE IMPAIRMENTS difficulty walking, decreased ROM, decreased strength, increased fascial restrictions, increased muscle spasms, impaired flexibility, and pain.   ACTIVITY LIMITATIONS carrying, lifting, bending, sitting, standing, squatting,  sleeping, stairs, transfers, and bed mobility  PARTICIPATION LIMITATIONS: meal prep, cleaning, laundry, shopping, occupation, and yard work  PERSONAL FACTORS Fitness, Past/current experiences, and Profession are also affecting patient's functional outcome.   REHAB POTENTIAL: Good  CLINICAL DECISION MAKING: Stable/uncomplicated  EVALUATION COMPLEXITY: Low   GOALS: Goals reviewed with patient? Yes  SHORT TERM GOALS: Target date: 12/09/2021  Patient will be independent with initial HEP  Baseline: Goal status: INITIAL  2.  Patient to report pain no greater than 2/10  Baseline:  Goal status: INITIAL  LONG TERM GOALS: Target date: 01/06/2022   Patient to be independent with advanced HEP  Baseline:  Goal status: INITIAL  2.  Patient to report pain no greater than 2/10  Baseline:  Goal status: INITIAL  3.  Patient to be able to do her usual IADL's without having to take frequent rest breaks Baseline:  Goal status: INITIAL  4.  Patient to be able to do higher level indoor IADL's without frequent rest breaks Baseline:  Goal status: INITIAL     PLAN: PT FREQUENCY: 2x/week  PT DURATION: 8 weeks  PLANNED INTERVENTIONS: Therapeutic exercises, Therapeutic activity, Neuromuscular re-education, Balance training, Gait training, Patient/Family education, Self Care, Joint mobilization, Stair training, Aquatic Therapy, Dry Needling, Electrical stimulation, Cryotherapy, Moist heat, Taping, Traction, Ultrasound, Ionotophoresis '4mg'$ /ml Dexamethasone, Manual therapy, and Re-evaluation  PLAN FOR NEXT SESSION:  Nustep, progress flexibility and core strengthening.     Isabel Caprice, PT 11/11/2021, 11:00 AM

## 2021-11-12 ENCOUNTER — Ambulatory Visit: Payer: Medicare Other

## 2021-11-12 DIAGNOSIS — R262 Difficulty in walking, not elsewhere classified: Secondary | ICD-10-CM

## 2021-11-12 DIAGNOSIS — M6281 Muscle weakness (generalized): Secondary | ICD-10-CM

## 2021-11-12 DIAGNOSIS — M5136 Other intervertebral disc degeneration, lumbar region: Secondary | ICD-10-CM | POA: Diagnosis not present

## 2021-11-12 DIAGNOSIS — M7062 Trochanteric bursitis, left hip: Secondary | ICD-10-CM | POA: Diagnosis not present

## 2021-11-12 DIAGNOSIS — M25552 Pain in left hip: Secondary | ICD-10-CM

## 2021-11-12 DIAGNOSIS — R252 Cramp and spasm: Secondary | ICD-10-CM

## 2021-11-12 NOTE — Therapy (Signed)
OUTPATIENT PHYSICAL THERAPY LOWER EXTREMITY TREATMENT NOTE   Patient Name: Michelle Gray MRN: [id number] DOB:[birth year], 66 y.o., female Today's Date: 11/12/2021   PT End of Session - 11/12/21 1454     Visit Number 2    Date for PT Re-Evaluation 01/06/22    Authorization Type Aet[employer]    PT Start Time 1448    PT Stop Time 1530    PT Time Calculation (min) 42 min    Activity Tolerance Patient tolerated treatment well    Behavior During Therapy [facility] for tasks assessed/performed             Past Medical History:  Diagnosis Date   Arthritis    Asthma    Cancer ([place]) 1990   right breast ca-mast with reconstr   Cancer [facility]) 12/2020   left breast DCIS   Eczema    dx by derm per patient   Fibromyalgia    Hyperlipidemia    Hypothyroidism    Neuromuscular disorder ([place])    Osteoporosis    PONV (postoperative nausea and vomiting)    Thyroid disease    Past Surgical History:  Procedure Laterality Date   APPENDECTOMY     BREAST IMPLANT REMOVAL Right 02/11/2021   Procedure: REMOVAL RIGHT BREAST IMPLANT;  Surgeon: Irene Limbo, MD;  Location: [place];  Service: Plastics;  Laterality: Right;   BREAST SURGERY Right 1990   Mastectomy   CAPSULECTOMY Right 02/11/2021   Procedure: CAPSULECTOMY;  Surgeon: Irene Limbo, MD;  Location: [place];  Service: Plastics;  Laterality: Right;   FOOT SURGERY     LAPROSCOPIC     TOTAL MASTECTOMY Left 02/11/2021   Procedure: LEFT TOTAL MASTECTOMY;  Surgeon: Rolm Bookbinder, MD;  Location: [place];  Service: General;  Laterality: Left;   [place]     Patient Active Problem List   Diagnosis Date Noted   Breast cancer, left breast ([place]) 02/11/2021   Cough variant asthma with ? component upper airway cough syndrome 12/23/2018   Primary osteoarthritis of both hands 02/21/2016   Fibromyalgia 02/21/2016   History of hypothyroidism 02/21/2016   Dyslipidemia 02/21/2016    History of breast cancer 02/21/2016   Age-related osteoporosis without current pathological fracture 02/21/2016   Unspecified hypothyroidism 08/06/2013   Other and unspecified hyperlipidemia 08/06/2013   Insomnia 08/06/2013   Intrinsic asthma 08/06/2013   Hip pain, left 01/12/2011   Plantar fasciitis 01/12/2011    PCP: Cari Caraway, MD  REFERRING PROVIDER: Bo Merino, MD   REFERRING DIAG: M70.62 (ICD-10-CM) - Trochanteric bursitis of left hip M51.36 (ICD-10-CM) - DDD (degenerative disc disease), lumbar   THERAPY DIAG:  Muscle weakness (generalized)  Cramp and spasm  Difficulty in walking, not elsewhere classified  Pain in left hip  Rationale for Evaluation and Treatment Rehabilitation  ONSET DATE: 09/24/2021   SUBJECTIVE:   SUBJECTIVE STATEMENT: Patient states she did all of her HEP but had some trouble with the quadruped ones.  "I can't do those because of my knees"  PERTINENT HISTORY: See above.    PAIN:  Are you having pain? Yes: NPRS scale: 5/10 Pain location: Left piriformis area Pain description: aching, referring Aggravating factors: standing Relieving factors: walking, rest  PRECAUTIONS: None  WEIGHT BEARING RESTRICTIONS No  FALLS:  Has patient fallen in last 6 months? No  LIVING ENVIRONMENT: Lives with: lives with their spouse Lives in: House/apartment Stairs: Yes: Internal: 16 steps; on left going up Has following equipment at home: None  OCCUPATION: Nurse Practicioner  PLOF: Independent  PATIENT GOALS To be able to gain control of her symptoms again and know what exercises are appropriate for her current condition.     OBJECTIVE:   DIAGNOSTIC FINDINGS: MRI indicates multi level thoracic and lumbar stenosis.   PATIENT SURVEYS:  FOTO do next visit.   COGNITION:  Overall cognitive status: Within functional limits for tasks assessed     SENSATION: WFL   MUSCLE LENGTH: Hamstrings: Right 70 deg; Left 60 deg Thomas test:  Right pos ; Left pos   POSTURE: No Significant postural limitations   LOWER EXTREMITY ROM:  WFL  LOWER EXTREMITY MMT:  Generally 4- to 4/5  LOWER EXTREMITY SPECIAL TESTS:  Hip special tests: Saralyn Pilar (FABER) test: negative and Ober's test: negative  FUNCTIONAL TESTS:  5 times sit to stand: 12.21 sec Timed up and go (TUG): 5.83 sec  GAIT: Distance walked: 50 Assistive device utilized: None Level of assistance: Complete Independence Comments: slightly antalgic    TODAY'S TREATMENT: 11/12/21: NuStep x 5 min level 3 (PT present to discuss progress) Reviewed HEP PPT with 90/90 heel tap x 20 PPT with dying bug x 20 (heavy vc's for correct technique) Hip IR/ER combo stretch x 5 each side hold 10 sec each IT band stretch 3 times each LE hold 20 sec each Hook lying clam with blue band x 20 Sidelying clam blue band x 20 both  11/11/21: Initial eval completed and initiated HEP    PATIENT EDUCATION:  Education details: Initiated HEP Person educated: Patient Education method: Consulting civil engineer, Media planner, Verbal cues, and Handouts Education comprehension: verbalized understanding, returned demonstration, and verbal cues required   HOME EXERCISE PROGRAM: Access Code: [email or web address] URL: [email or web address] Date: 11/12/2021 Prepared by: Candyce Churn  Exercises - Standing Hamstring Stretch on Chair  - 1 x daily - 7 x weekly - 1 sets - 3 reps - 30 sec hold - Standing Quad Stretch with Rotation  - 1 x daily - 7 x weekly - 1 sets - 3 reps - 30 hold - Supine Pelvic Tilt  - 1 x daily - 7 x weekly - 3 sets - 10 reps - Supine 90/90 Alternating Heel Touches with Posterior Pelvic Tilt  - 1 x daily - 7 x weekly - 2 sets - 10 reps - Supine Dead Bug with Leg Extension  - 1 x daily - 7 x weekly - 2 sets - 10 reps - Supine Hip Internal and External Rotation  - 1 x daily - 7 x weekly - 1 sets - 5 reps - 10 sec hold - Supine ITB Stretch with Strap  - 1 x daily - 7 x weekly -  1 sets - 3 reps - 20 hold - Hooklying Clamshell with Resistance  - 1 x daily - 7 x weekly - 3 sets - 10 reps - Clamshell with Resistance  - 1 x daily - 7 x weekly - 3 sets - 10 repsAccess Code: [email or web address] URL: [email or web address] Date: 11/11/2021 Prepared by: Candyce Churn  Exercises - Standing Hamstring Stretch on Chair  - 1 x daily - 7 x weekly - 1 sets - 3 reps - 30 sec hold - Standing Quad Stretch with Rotation  - 1 x daily - 7 x weekly - 1 sets - 3 reps - 30 hold - Supine Pelvic Tilt  - 1 x daily - 7 x weekly - 3 sets - 10 reps - Supine Dead Bug with Leg Extension  - 1 x daily - 7 x  weekly - 2 sets - 10 reps - Quadruped Fire Hydrant  - 1 x daily - 7 x weekly - 3 sets - 10 reps - Quadruped Bent Leg Hip Extension  - 1 x daily - 7 x weekly - 3 sets - 10 reps  ASSESSMENT:  CLINICAL IMPRESSION: Patient had some difficulty with quadruped exercises at home due to knee issues.  She was able to complete other core exercises with good technique and no increase in pain.  She struggled with coordination on dying bug.  She also had some discomfort in left hip in the area of the trochanteric bursa with clamshell but was able to complete all reps.  She would benefit from continuing skilled PT to address proximal weakness and hip pain.     OBJECTIVE IMPAIRMENTS difficulty walking, decreased ROM, decreased strength, increased fascial restrictions, increased muscle spasms, impaired flexibility, and pain.   ACTIVITY LIMITATIONS carrying, lifting, bending, sitting, standing, squatting, sleeping, stairs, transfers, and bed mobility  PARTICIPATION LIMITATIONS: meal prep, cleaning, laundry, shopping, occupation, and yard work  PERSONAL FACTORS Fitness, Past/current experiences, and Profession are also affecting patient's functional outcome.   REHAB POTENTIAL: Good  CLINICAL DECISION MAKING: Stable/uncomplicated  EVALUATION COMPLEXITY: Low   GOALS: Goals reviewed with patient?  Yes  SHORT TERM GOALS: Target date: 12/09/2021  Patient will be independent with initial HEP  Baseline: Goal status: INITIAL  2.  Patient to report pain no greater than 2/10  Baseline:  Goal status: INITIAL  LONG TERM GOALS: Target date: 01/06/2022   Patient to be independent with advanced HEP  Baseline:  Goal status: INITIAL  2.  Patient to report pain no greater than 2/10  Baseline:  Goal status: INITIAL  3.  Patient to be able to do her usual IADL's without having to take frequent rest breaks Baseline:  Goal status: INITIAL  4.  Patient to be able to do higher level indoor IADL's without frequent rest breaks Baseline:  Goal status: INITIAL     PLAN: PT FREQUENCY: 2x/week  PT DURATION: 8 weeks  PLANNED INTERVENTIONS: Therapeutic exercises, Therapeutic activity, Neuromuscular re-education, Balance training, Gait training, Patient/Family education, Self Care, Joint mobilization, Stair training, Aquatic Therapy, Dry Needling, Electrical stimulation, Cryotherapy, Moist heat, Taping, Traction, Ultrasound, Ionotophoresis '4mg'$ /ml Dexamethasone, Manual therapy, and Re-evaluation  PLAN FOR NEXT SESSION:  Nustep, progress flexibility and core strengthening.     Anderson Malta B. Ilyse Tremain, PT 11/12/21 5:19 PM   [facility] [address] [place], [place] [address] Phone # [phone] Fax [phone]

## 2021-11-17 ENCOUNTER — Ambulatory Visit: Payer: Medicare Other | Admitting: Rehabilitative and Restorative Service Providers"

## 2021-11-20 ENCOUNTER — Ambulatory Visit: Payer: Medicare Other

## 2021-11-20 DIAGNOSIS — M25552 Pain in left hip: Secondary | ICD-10-CM

## 2021-11-20 DIAGNOSIS — M6281 Muscle weakness (generalized): Secondary | ICD-10-CM

## 2021-11-20 DIAGNOSIS — M5136 Other intervertebral disc degeneration, lumbar region: Secondary | ICD-10-CM | POA: Diagnosis not present

## 2021-11-20 DIAGNOSIS — R262 Difficulty in walking, not elsewhere classified: Secondary | ICD-10-CM

## 2021-11-20 DIAGNOSIS — R252 Cramp and spasm: Secondary | ICD-10-CM

## 2021-11-20 DIAGNOSIS — M7062 Trochanteric bursitis, left hip: Secondary | ICD-10-CM | POA: Diagnosis not present

## 2021-11-20 NOTE — Therapy (Signed)
OUTPATIENT PHYSICAL THERAPY LOWER EXTREMITY TREATMENT NOTE   Patient Name: BRANTLEIGH MIFFLIN MRN: [id number] DOB:18-Feb-1955, 66 y.o., female Today's Date: 11/20/2021   PT End of Session - 11/20/21 0852     Visit Number 3    Date for PT Re-Evaluation 01/06/22    Authorization Type Aetna    PT Start Time [phone]    PT Stop Time 0926    PT Time Calculation (min) 39 min    Activity Tolerance Patient tolerated treatment well    Behavior During Therapy [facility] for tasks assessed/performed             Past Medical History:  Diagnosis Date   Arthritis    Asthma    Cancer ([place]) 1990   right breast ca-mast with reconstr   Cancer [facility]) 12/2020   left breast DCIS   Eczema    dx by derm per patient   Fibromyalgia    Hyperlipidemia    Hypothyroidism    Neuromuscular disorder ([place])    Osteoporosis    PONV (postoperative nausea and vomiting)    Thyroid disease    Past Surgical History:  Procedure Laterality Date   APPENDECTOMY     BREAST IMPLANT REMOVAL Right 02/11/2021   Procedure: REMOVAL RIGHT BREAST IMPLANT;  Surgeon: Irene Limbo, MD;  Location: [place];  Service: Plastics;  Laterality: Right;   BREAST SURGERY Right 1990   Mastectomy   CAPSULECTOMY Right 02/11/2021   Procedure: CAPSULECTOMY;  Surgeon: Irene Limbo, MD;  Location: [place];  Service: Plastics;  Laterality: Right;   FOOT SURGERY     LAPROSCOPIC     TOTAL MASTECTOMY Left 02/11/2021   Procedure: LEFT TOTAL MASTECTOMY;  Surgeon: Rolm Bookbinder, MD;  Location: [place];  Service: General;  Laterality: Left;   [place]     Patient Active Problem List   Diagnosis Date Noted   Breast cancer, left breast ([place]) 02/11/2021   Cough variant asthma with ? component upper airway cough syndrome 12/23/2018   Primary osteoarthritis of both hands 02/21/2016   Fibromyalgia 02/21/2016   History of hypothyroidism 02/21/2016   Dyslipidemia 02/21/2016    History of breast cancer 02/21/2016   Age-related osteoporosis without current pathological fracture 02/21/2016   Unspecified hypothyroidism 08/06/2013   Other and unspecified hyperlipidemia 08/06/2013   Insomnia 08/06/2013   Intrinsic asthma 08/06/2013   Hip pain, left 01/12/2011   Plantar fasciitis 01/12/2011    PCP: Cari Caraway, MD  REFERRING PROVIDER: Bo Merino, MD   REFERRING DIAG: M70.62 (ICD-10-CM) - Trochanteric bursitis of left hip M51.36 (ICD-10-CM) - DDD (degenerative disc disease), lumbar   THERAPY DIAG:  Muscle weakness (generalized)  Cramp and spasm  Difficulty in walking, not elsewhere classified  Pain in left hip  Rationale for Evaluation and Treatment Rehabilitation  ONSET DATE: 09/24/2021   SUBJECTIVE:   SUBJECTIVE STATEMENT: Patient states she couldn't come on Monday due to had RSV vaccine and was very sore and felt bad.  One of the stretches is causing some pain in the knee, the quad stretch, but she modified and is doing fine with this one now.  Overall, a little better.  No significant changes at this point.    PERTINENT HISTORY: See above.    PAIN:  Are you having pain? Yes: NPRS scale: 5/10 Pain location: Left piriformis area Pain description: aching, referring Aggravating factors: standing Relieving factors: walking, rest  PRECAUTIONS: None  WEIGHT BEARING RESTRICTIONS No  FALLS:  Has patient  fallen in last 6 months? No  LIVING ENVIRONMENT: Lives with: lives with their spouse Lives in: House/apartment Stairs: Yes: Internal: 16 steps; on left going up Has following equipment at home: None  OCCUPATION: Tour manager  PLOF: Independent  PATIENT GOALS To be able to gain control of her symptoms again and know what exercises are appropriate for her current condition.     OBJECTIVE:   DIAGNOSTIC FINDINGS: MRI indicates multi level thoracic and lumbar stenosis.   PATIENT SURVEYS:  FOTO do next visit.    COGNITION:  Overall cognitive status: Within functional limits for tasks assessed     SENSATION: WFL   MUSCLE LENGTH: Hamstrings: Right 70 deg; Left 60 deg Thomas test: Right pos ; Left pos   POSTURE: No Significant postural limitations   LOWER EXTREMITY ROM:  WFL  LOWER EXTREMITY MMT:  Generally 4- to 4/5  LOWER EXTREMITY SPECIAL TESTS:  Hip special tests: Saralyn Pilar (FABER) test: negative and Ober's test: negative  FUNCTIONAL TESTS:  5 times sit to stand: 12.21 sec Timed up and go (TUG): 5.83 sec  GAIT: Distance walked: 50 Assistive device utilized: None Level of assistance: Complete Independence Comments: slightly antalgic    TODAY'S TREATMENT: 11/20/21: NuStep x 5 min level 3 (PT present to discuss progress) Swiss ball rolls fwd and to each side seated on mat table for trunk stretching x 5 each Seated piriformis stretch on left 2 x 30 sec Supine HS stretch with strap 3 x 20 sec both Supine IT band stretch with strap 3 x 20 sec both Hip IR/ER combo stretch x 5 each side hold 10 sec each PPT x 20 PPT with 90/90 heel tap x 20 PPT with dying bug x 20 (heavy vc's for correct technique) Hook lying clam with blue band x 20 Sidelying clam blue band x 20 both Sidelying hip abduction 2 x 10 each both Bridging 2 x 10 Hamstring ball rolls (red physio ball) x 20 Bridging on swiss ball 2 x 10  11/12/21: NuStep x 5 min level 3 (PT present to discuss progress) Reviewed HEP PPT with 90/90 heel tap x 20 PPT with dying bug x 20 (heavy vc's for correct technique) Hip IR/ER combo stretch x 5 each side hold 10 sec each IT band stretch 3 times each LE hold 20 sec each Hook lying clam with blue band x 20 Sidelying clam blue band x 20 both  11/11/21: Initial eval completed and initiated HEP    PATIENT EDUCATION:  Education details: Initiated HEP Person educated: Patient Education method: Consulting civil engineer, Media planner, Verbal cues, and Handouts Education  comprehension: verbalized understanding, returned demonstration, and verbal cues required   HOME EXERCISE PROGRAM: Access Code: [email or web address] URL: [email or web address] Date: 11/12/2021 Prepared by: Candyce Churn  Exercises - Standing Hamstring Stretch on Chair  - 1 x daily - 7 x weekly - 1 sets - 3 reps - 30 sec hold - Standing Quad Stretch with Rotation  - 1 x daily - 7 x weekly - 1 sets - 3 reps - 30 hold - Supine Pelvic Tilt  - 1 x daily - 7 x weekly - 3 sets - 10 reps - Supine 90/90 Alternating Heel Touches with Posterior Pelvic Tilt  - 1 x daily - 7 x weekly - 2 sets - 10 reps - Supine Dead Bug with Leg Extension  - 1 x daily - 7 x weekly - 2 sets - 10 reps - Supine Hip Internal and External Rotation  - 1 x daily - 7  x weekly - 1 sets - 5 reps - 10 sec hold - Supine ITB Stretch with Strap  - 1 x daily - 7 x weekly - 1 sets - 3 reps - 20 hold - Hooklying Clamshell with Resistance  - 1 x daily - 7 x weekly - 3 sets - 10 reps - Clamshell with Resistance  - 1 x daily - 7 x weekly - 3 sets - 10 repsAccess Code: [email or web address] URL: [email or web address] Date: 11/11/2021 Prepared by: Candyce Churn  Exercises - Standing Hamstring Stretch on Chair  - 1 x daily - 7 x weekly - 1 sets - 3 reps - 30 sec hold - Standing Quad Stretch with Rotation  - 1 x daily - 7 x weekly - 1 sets - 3 reps - 30 hold - Supine Pelvic Tilt  - 1 x daily - 7 x weekly - 3 sets - 10 reps - Supine Dead Bug with Leg Extension  - 1 x daily - 7 x weekly - 2 sets - 10 reps - Quadruped Fire Hydrant  - 1 x daily - 7 x weekly - 3 sets - 10 reps - Quadruped Bent Leg Hip Extension  - 1 x daily - 7 x weekly - 3 sets - 10 reps  ASSESSMENT:  CLINICAL IMPRESSION: Ms. Bomkamp did very well with all exercises today.  She denied any pain throughout session.  She demonstrated good control of lower abdominals throughout core exercises.   She was unable to do combined bridge and ham curl on physio ball but completed  the bridging on ball seperately and then did hamstring curls in hook lying position.   She would benefit from continuing skilled PT to address proximal weakness and hip pain.     OBJECTIVE IMPAIRMENTS difficulty walking, decreased ROM, decreased strength, increased fascial restrictions, increased muscle spasms, impaired flexibility, and pain.   ACTIVITY LIMITATIONS carrying, lifting, bending, sitting, standing, squatting, sleeping, stairs, transfers, and bed mobility  PARTICIPATION LIMITATIONS: meal prep, cleaning, laundry, shopping, occupation, and yard work  PERSONAL FACTORS Fitness, Past/current experiences, and Profession are also affecting patient's functional outcome.   REHAB POTENTIAL: Good  CLINICAL DECISION MAKING: Stable/uncomplicated  EVALUATION COMPLEXITY: Low   GOALS: Goals reviewed with patient? Yes  SHORT TERM GOALS: Target date: 12/09/2021  Patient will be independent with initial HEP  Baseline: Goal status: INITIAL  2.  Patient to report pain no greater than 2/10  Baseline:  Goal status: INITIAL  LONG TERM GOALS: Target date: 01/06/2022   Patient to be independent with advanced HEP  Baseline:  Goal status: INITIAL  2.  Patient to report pain no greater than 2/10  Baseline:  Goal status: INITIAL  3.  Patient to be able to do her usual IADL's without having to take frequent rest breaks Baseline:  Goal status: INITIAL  4.  Patient to be able to do higher level indoor IADL's without frequent rest breaks Baseline:  Goal status: INITIAL     PLAN: PT FREQUENCY: 2x/week  PT DURATION: 8 weeks  PLANNED INTERVENTIONS: Therapeutic exercises, Therapeutic activity, Neuromuscular re-education, Balance training, Gait training, Patient/Family education, Self Care, Joint mobilization, Stair training, Aquatic Therapy, Dry Needling, Electrical stimulation, Cryotherapy, Moist heat, Taping, Traction, Ultrasound, Ionotophoresis '4mg'$ /ml Dexamethasone, Manual therapy, and  Re-evaluation  PLAN FOR NEXT SESSION:  Nustep, progress flexibility and core strengthening.     Anderson Malta B. Jeferson Boozer, PT 11/20/21 9:27 AM   [facility] [address] [place], [place] [address] Phone # [phone] Fax  [phone]  

## 2021-11-25 ENCOUNTER — Ambulatory Visit: Payer: Medicare Other | Admitting: Rehabilitative and Restorative Service Providers"

## 2021-11-25 ENCOUNTER — Encounter: Payer: Self-pay | Admitting: Rehabilitative and Restorative Service Providers"

## 2021-11-25 DIAGNOSIS — M7062 Trochanteric bursitis, left hip: Secondary | ICD-10-CM | POA: Diagnosis not present

## 2021-11-25 DIAGNOSIS — M6281 Muscle weakness (generalized): Secondary | ICD-10-CM

## 2021-11-25 DIAGNOSIS — M25552 Pain in left hip: Secondary | ICD-10-CM

## 2021-11-25 DIAGNOSIS — R252 Cramp and spasm: Secondary | ICD-10-CM

## 2021-11-25 DIAGNOSIS — R262 Difficulty in walking, not elsewhere classified: Secondary | ICD-10-CM

## 2021-11-25 DIAGNOSIS — M5136 Other intervertebral disc degeneration, lumbar region: Secondary | ICD-10-CM | POA: Diagnosis not present

## 2021-11-25 NOTE — Therapy (Signed)
OUTPATIENT PHYSICAL THERAPY TREATMENT NOTE   Patient Name: Michelle Gray MRN: [id number] DOB:03/28/1955, 66 y.o., female Today's Date: 11/25/2021   PT End of Session - 11/25/21 0850     Visit Number 4    Date for PT Re-Evaluation 01/06/22    Authorization Type [employer]    PT Start Time [phone]    PT Stop Time 0925    PT Time Calculation (min) 38 min    Activity Tolerance Patient tolerated treatment well    Behavior During Therapy [facility] for tasks assessed/performed             Past Medical History:  Diagnosis Date   Arthritis    Asthma    Cancer ([place]) 1990   right breast ca-mast with reconstr   Cancer [facility]) 12/2020   left breast DCIS   Eczema    dx by derm per patient   Fibromyalgia    Hyperlipidemia    Hypothyroidism    Neuromuscular disorder ([place])    Osteoporosis    PONV (postoperative nausea and vomiting)    Thyroid disease    Past Surgical History:  Procedure Laterality Date   APPENDECTOMY     BREAST IMPLANT REMOVAL Right 02/11/2021   Procedure: REMOVAL RIGHT BREAST IMPLANT;  Surgeon: Irene Limbo, MD;  Location: [place];  Service: Plastics;  Laterality: Right;   BREAST SURGERY Right 1990   Mastectomy   CAPSULECTOMY Right 02/11/2021   Procedure: CAPSULECTOMY;  Surgeon: Irene Limbo, MD;  Location: [place];  Service: Plastics;  Laterality: Right;   FOOT SURGERY     LAPROSCOPIC     TOTAL MASTECTOMY Left 02/11/2021   Procedure: LEFT TOTAL MASTECTOMY;  Surgeon: Rolm Bookbinder, MD;  Location: [place];  Service: General;  Laterality: Left;   [place]     Patient Active Problem List   Diagnosis Date Noted   Breast cancer, left breast ([place]) 02/11/2021   Cough variant asthma with ? component upper airway cough syndrome 12/23/2018   Primary osteoarthritis of both hands 02/21/2016   Fibromyalgia 02/21/2016   History of hypothyroidism 02/21/2016   Dyslipidemia 02/21/2016   History of  breast cancer 02/21/2016   Age-related osteoporosis without current pathological fracture 02/21/2016   Unspecified hypothyroidism 08/06/2013   Other and unspecified hyperlipidemia 08/06/2013   Insomnia 08/06/2013   Intrinsic asthma 08/06/2013   Hip pain, left 01/12/2011   Plantar fasciitis 01/12/2011    PCP: Cari Caraway, MD  REFERRING PROVIDER: Bo Merino, MD   REFERRING DIAG: M70.62 (ICD-10-CM) - Trochanteric bursitis of left hip M51.36 (ICD-10-CM) - DDD (degenerative disc disease), lumbar   THERAPY DIAG:  Muscle weakness (generalized)  Cramp and spasm  Difficulty in walking, not elsewhere classified  Pain in left hip  Rationale for Evaluation and Treatment Rehabilitation  ONSET DATE: 09/24/2021   SUBJECTIVE:   SUBJECTIVE STATEMENT: Pt reports being in 8/10 pain last night, but 1/10 currently.  Pt states that she always feels better after she leaves PT.  PERTINENT HISTORY: See above.    PAIN:  Are you having pain? Yes: NPRS scale: current 1/10 Pain location: Left piriformis area Pain description: aching, referring Aggravating factors: standing Relieving factors: walking, rest  PRECAUTIONS: None  WEIGHT BEARING RESTRICTIONS No  FALLS:  Has patient fallen in last 6 months? No  LIVING ENVIRONMENT: Lives with: lives with their spouse Lives in: House/apartment Stairs: Yes: Internal: 16 steps; on left going up Has following equipment at home: None  OCCUPATION: Tour manager  PLOF: Independent  PATIENT GOALS To be able to gain control of her symptoms again and know what exercises are appropriate for her current condition.     OBJECTIVE:   DIAGNOSTIC FINDINGS: MRI indicates multi level thoracic and lumbar stenosis.   PATIENT SURVEYS:  Eval:  FOTO do next visit.  11/25/2021:  FOTO 43% (projected to be 56% by visit 13)  COGNITION:  Overall cognitive status: Within functional limits for tasks assessed     SENSATION: WFL   MUSCLE  LENGTH: Hamstrings: Right 70 deg; Left 60 deg Thomas test: Right pos ; Left pos   POSTURE: No Significant postural limitations   LOWER EXTREMITY ROM:  WFL  LOWER EXTREMITY MMT:  Eval:  Generally 4- to 4/5  LOWER EXTREMITY SPECIAL TESTS:  Hip special tests: Saralyn Pilar (FABER) test: negative and Ober's test: negative  FUNCTIONAL TESTS:  Eval: 5 times sit to stand: 12.21 sec Timed up and go (TUG): 5.83 sec  GAIT: Distance walked: 50 Assistive device utilized: None Level of assistance: Complete Independence Comments: slightly antalgic    TODAY'S TREATMENT: 11/25/2021: Nustep level 4 x6 min with PT present to discuss status 3 way green pball rollout 5x10 sec each Seated piriformis stretch 2x20 sec bilat Initial FOTO 43% Supine hamstring, IT band, and hip adductor stretch with strap 2x20 sec bilat Supine posterior pelvic tilt (PPT) 2x10 Supine PPT with marching with yellow loop around knees 2x10 bilat Bridging with yellow loop around knees 2x10 Sidelying clamshell with yellow loop 2x10 bilat Manual therapy in right sidelying:  Addaday to left piriformis, IT band/TFL, left glutes, left hamstring   11/20/21: NuStep x 5 min level 3 (PT present to discuss progress) Swiss ball rolls fwd and to each side seated on mat table for trunk stretching x 5 each Seated piriformis stretch on left 2 x 30 sec Supine HS stretch with strap 3 x 20 sec both Supine IT band stretch with strap 3 x 20 sec both Hip IR/ER combo stretch x 5 each side hold 10 sec each PPT x 20 PPT with 90/90 heel tap x 20 PPT with dying bug x 20 (heavy vc's for correct technique) Hook lying clam with blue band x 20 Sidelying clam blue band x 20 both Sidelying hip abduction 2 x 10 each both Bridging 2 x 10 Hamstring ball rolls (red physio ball) x 20 Bridging on swiss ball 2 x 10  11/12/21: NuStep x 5 min level 3 (PT present to discuss progress) Reviewed HEP PPT with 90/90 heel tap x 20 PPT with dying bug x  20 (heavy vc's for correct technique) Hip IR/ER combo stretch x 5 each side hold 10 sec each IT band stretch 3 times each LE hold 20 sec each Hook lying clam with blue band x 20 Sidelying clam blue band x 20 both    PATIENT EDUCATION:  Education details: Initiated HEP Person educated: Patient Education method: Consulting civil engineer, Media planner, Verbal cues, and Handouts Education comprehension: verbalized understanding, returned demonstration, and verbal cues required   HOME EXERCISE PROGRAM: Access Code: [email or web address] URL: [email or web address] Date: 11/12/2021 Prepared by: Candyce Churn  Exercises - Standing Hamstring Stretch on Chair  - 1 x daily - 7 x weekly - 1 sets - 3 reps - 30 sec hold - Standing Quad Stretch with Rotation  - 1 x daily - 7 x weekly - 1 sets - 3 reps - 30 hold - Supine Pelvic Tilt  - 1 x daily - 7 x weekly - 3 sets - 10 reps -  Supine 90/90 Alternating Heel Touches with Posterior Pelvic Tilt  - 1 x daily - 7 x weekly - 2 sets - 10 reps - Supine Dead Bug with Leg Extension  - 1 x daily - 7 x weekly - 2 sets - 10 reps - Supine Hip Internal and External Rotation  - 1 x daily - 7 x weekly - 1 sets - 5 reps - 10 sec hold - Supine ITB Stretch with Strap  - 1 x daily - 7 x weekly - 1 sets - 3 reps - 20 hold - Hooklying Clamshell with Resistance  - 1 x daily - 7 x weekly - 3 sets - 10 reps - Clamshell with Resistance  - 1 x daily - 7 x weekly - 3 sets - 10 reps  ASSESSMENT:  CLINICAL IMPRESSION: Ms. Nakamura presents to skilled PT reporting that she is currently have low pain, but states that last night, she had 8/10 pain.  Pt able to perform initial FOTO during visit today.  Pt with continued progress towards therapeutic goals and able to use increased resistance on theraband loop.  Pt continues to require skilled PT to progress towards goal related activities.   OBJECTIVE IMPAIRMENTS difficulty walking, decreased ROM, decreased strength, increased fascial  restrictions, increased muscle spasms, impaired flexibility, and pain.   ACTIVITY LIMITATIONS carrying, lifting, bending, sitting, standing, squatting, sleeping, stairs, transfers, and bed mobility  PARTICIPATION LIMITATIONS: meal prep, cleaning, laundry, shopping, occupation, and yard work  PERSONAL FACTORS Fitness, Past/current experiences, and Profession are also affecting patient's functional outcome.   REHAB POTENTIAL: Good  CLINICAL DECISION MAKING: Stable/uncomplicated  EVALUATION COMPLEXITY: Low   GOALS: Goals reviewed with patient? Yes  SHORT TERM GOALS: Target date: 12/09/2021  Patient will be independent with initial HEP  Baseline: Goal status: MET  2.  Patient to report pain no greater than 2/10  Baseline:  Goal status: IN PROGRESS  LONG TERM GOALS: Target date: 01/06/2022   Patient to be independent with advanced HEP  Baseline:  Goal status: INITIAL  2.  Patient to report pain no greater than 2/10  Baseline:  Goal status: INITIAL  3.  Patient to be able to do her usual IADL's without having to take frequent rest breaks Baseline:  Goal status: INITIAL  4.  Patient to be able to do higher level indoor IADL's without frequent rest breaks Baseline:  Goal status: INITIAL     PLAN: PT FREQUENCY: 2x/week  PT DURATION: 8 weeks  PLANNED INTERVENTIONS: Therapeutic exercises, Therapeutic activity, Neuromuscular re-education, Balance training, Gait training, Patient/Family education, Self Care, Joint mobilization, Stair training, Aquatic Therapy, Dry Needling, Electrical stimulation, Cryotherapy, Moist heat, Taping, Traction, Ultrasound, Ionotophoresis 4mg /ml Dexamethasone, Manual therapy, and Re-evaluation  PLAN FOR NEXT SESSION:  Nustep, progress flexibility and core strengthening.     Juel Burrow, PT 11/25/21 9:32 AM   [facility] [address] [place], [place] [address] Phone # [phone] Fax [phone]

## 2021-11-27 ENCOUNTER — Ambulatory Visit: Payer: Medicare Other

## 2021-11-27 DIAGNOSIS — M6281 Muscle weakness (generalized): Secondary | ICD-10-CM

## 2021-11-27 DIAGNOSIS — M25552 Pain in left hip: Secondary | ICD-10-CM

## 2021-11-27 DIAGNOSIS — R262 Difficulty in walking, not elsewhere classified: Secondary | ICD-10-CM

## 2021-11-27 DIAGNOSIS — M7062 Trochanteric bursitis, left hip: Secondary | ICD-10-CM | POA: Diagnosis not present

## 2021-11-27 DIAGNOSIS — M5136 Other intervertebral disc degeneration, lumbar region: Secondary | ICD-10-CM | POA: Diagnosis not present

## 2021-11-27 DIAGNOSIS — R252 Cramp and spasm: Secondary | ICD-10-CM

## 2021-11-27 NOTE — Therapy (Signed)
OUTPATIENT PHYSICAL THERAPY TREATMENT NOTE   Patient Name: Michelle Gray MRN: [id number] DOB:09-Sep-1955, 65 y.o., female Today's Date: 11/27/2021   PT End of Session - 11/27/21 1420     Visit Number 5    Date for PT Re-Evaluation 01/06/22    Authorization Type [employer]et[employer]    PT Start Time 1403    PT Stop Time 5364    PT Time Calculation (min) 42 min    Activity Tolerance Patient tolerated treatment well    Behavior During Therapy [facility] for tasks assessed/performed             Past Medical History:  Diagnosis Date   Arthritis    Asthma    Cancer ([place]) 1990   right breast ca-mast with reconstr   Cancer [facility]) 12/2020   left breast DCIS   Eczema    dx by derm per patient   Fibromyalgia    Hyperlipidemia    Hypothyroidism    Neuromuscular disorder ([place])    Osteoporosis    PONV (postoperative nausea and vomiting)    Thyroid disease    Past Surgical History:  Procedure Laterality Date   APPENDECTOMY     BREAST IMPLANT REMOVAL Right 02/11/2021   Procedure: REMOVAL RIGHT BREAST IMPLANT;  Surgeon: Irene Limbo, MD;  Location: [place];  Service: Plastics;  Laterality: Right;   BREAST SURGERY Right 1990   Mastectomy   CAPSULECTOMY Right 02/11/2021   Procedure: CAPSULECTOMY;  Surgeon: Irene Limbo, MD;  Location: [place];  Service: Plastics;  Laterality: Right;   FOOT SURGERY     LAPROSCOPIC     TOTAL MASTECTOMY Left 02/11/2021   Procedure: LEFT TOTAL MASTECTOMY;  Surgeon: Rolm Bookbinder, MD;  Location: [place];  Service: General;  Laterality: Left;   [place]     Patient Active Problem List   Diagnosis Date Noted   Breast cancer, left breast ([place]) 02/11/2021   Cough variant asthma with ? component upper airway cough syndrome 12/23/2018   Primary osteoarthritis of both hands 02/21/2016   Fibromyalgia 02/21/2016   History of hypothyroidism 02/21/2016   Dyslipidemia 02/21/2016   History of  breast cancer 02/21/2016   Age-related osteoporosis without current pathological fracture 02/21/2016   Unspecified hypothyroidism 08/06/2013   Other and unspecified hyperlipidemia 08/06/2013   Insomnia 08/06/2013   Intrinsic asthma 08/06/2013   Hip pain, left 01/12/2011   Plantar fasciitis 01/12/2011    PCP: Cari Caraway, MD  REFERRING PROVIDER: Bo Merino, MD   REFERRING DIAG: M70.62 (ICD-10-CM) - Trochanteric bursitis of left hip M51.36 (ICD-10-CM) - DDD (degenerative disc disease), lumbar   THERAPY DIAG:  Muscle weakness (generalized)  Cramp and spasm  Difficulty in walking, not elsewhere classified  Pain in left hip  Rationale for Evaluation and Treatment Rehabilitation  ONSET DATE: 09/24/2021   SUBJECTIVE:   SUBJECTIVE STATEMENT: Pt reports she had some increase in pain after last visit.  She recalls feeling some increase in discomfort when doing the yellow loop exercise.    PERTINENT HISTORY: See above.    PAIN:  Are you having pain? Yes: NPRS scale: current 1/10 Pain location: Left piriformis area Pain description: aching, referring Aggravating factors: standing Relieving factors: walking, rest  PRECAUTIONS: None  WEIGHT BEARING RESTRICTIONS No  FALLS:  Has patient fallen in last 6 months? No  LIVING ENVIRONMENT: Lives with: lives with their spouse Lives in: House/apartment Stairs: Yes: Internal: 16 steps; on left going up Has following equipment at home: None  OCCUPATION: Nurse Practicioner  PLOF: Independent  PATIENT GOALS To be able to gain control of her symptoms again and know what exercises are appropriate for her current condition.     OBJECTIVE:   DIAGNOSTIC FINDINGS: MRI indicates multi level thoracic and lumbar stenosis.   PATIENT SURVEYS:  Eval:  FOTO do next visit.  11/25/2021:  FOTO 43% (projected to be 56% by visit 13)  COGNITION:  Overall cognitive status: Within functional limits for tasks  assessed     SENSATION: WFL   MUSCLE LENGTH: Hamstrings: Right 70 deg; Left 60 deg Thomas test: Right pos ; Left pos   POSTURE: No Significant postural limitations   LOWER EXTREMITY ROM:  WFL  LOWER EXTREMITY MMT:  Eval:  Generally 4- to 4/5  LOWER EXTREMITY SPECIAL TESTS:  Hip special tests: Saralyn Pilar (FABER) test: negative and Ober's test: negative  FUNCTIONAL TESTS:  Eval: 5 times sit to stand: 12.21 sec Timed up and go (TUG): 5.83 sec  GAIT: Distance walked: 50 Assistive device utilized: None Level of assistance: Complete Independence Comments: slightly antalgic    TODAY'S TREATMENT: 11/27/2021: Nustep level 4 x 5 min with PT present to discuss status Supine piriformis stretch 3 x 30 sec each side Supine IT band stretch 3 x 30 sec (also adductor stretch between each IT band stretch) Supine posterior pelvic tilt (PPT) 2x10 PPT with 90/90 heel taps x 20 Bridging  2x10 Sidelying clamshell  2x10 bilat Manual therapy: Addaday to left hip, sciatic notch, IT band, gluts, and low back 3 way green pball rollout 5x10 sec each   11/25/2021: Nustep level 4 x6 min with PT present to discuss status 3 way green pball rollout 5x10 sec each Seated piriformis stretch 2x20 sec bilat Initial FOTO 43% Supine hamstring, IT band, and hip adductor stretch with strap 2x20 sec bilat Supine posterior pelvic tilt (PPT) 2x10 Supine PPT with marching with yellow loop around knees 2x10 bilat Bridging with yellow loop around knees 2x10 Sidelying clamshell with yellow loop 2x10 bilat Manual therapy in right sidelying:  Addaday to left piriformis, IT band/TFL, left glutes, left hamstring   11/20/21: NuStep x 5 min level 3 (PT present to discuss progress) Swiss ball rolls fwd and to each side seated on mat table for trunk stretching x 5 each Seated piriformis stretch on left 2 x 30 sec Supine HS stretch with strap 3 x 20 sec both Supine IT band stretch with strap 3 x 20 sec  both Hip IR/ER combo stretch x 5 each side hold 10 sec each PPT x 20 PPT with 90/90 heel tap x 20 PPT with dying bug x 20 (heavy vc's for correct technique) Hook lying clam with blue band x 20 Sidelying clam blue band x 20 both Sidelying hip abduction 2 x 10 each both Bridging 2 x 10 Hamstring ball rolls (red physio ball) x 20 Bridging on swiss ball 2 x 10    PATIENT EDUCATION:  Education details: Initiated HEP Person educated: Patient Education method: Consulting civil engineer, Media planner, Verbal cues, and Handouts Education comprehension: verbalized understanding, returned demonstration, and verbal cues required   HOME EXERCISE PROGRAM: Access Code: [email or web address] URL: [email or web address] Date: 11/12/2021 Prepared by: Candyce Churn  Exercises - Standing Hamstring Stretch on Chair  - 1 x daily - 7 x weekly - 1 sets - 3 reps - 30 sec hold - Standing Quad Stretch with Rotation  - 1 x daily - 7 x weekly - 1 sets - 3 reps - 30 hold - Supine Pelvic  Tilt  - 1 x daily - 7 x weekly - 3 sets - 10 reps - Supine 90/90 Alternating Heel Touches with Posterior Pelvic Tilt  - 1 x daily - 7 x weekly - 2 sets - 10 reps - Supine Dead Bug with Leg Extension  - 1 x daily - 7 x weekly - 2 sets - 10 reps - Supine Hip Internal and External Rotation  - 1 x daily - 7 x weekly - 1 sets - 5 reps - 10 sec hold - Supine ITB Stretch with Strap  - 1 x daily - 7 x weekly - 1 sets - 3 reps - 20 hold - Hooklying Clamshell with Resistance  - 1 x daily - 7 x weekly - 3 sets - 10 reps - Clamshell with Resistance  - 1 x daily - 7 x weekly - 3 sets - 10 reps  ASSESSMENT:  CLINICAL IMPRESSION: Ms. Buller was able to tolerate todays activities without increase in pain.  We held on resistance band due to possible irritation.  She is diligent with her HEP.  She should continue to do well.   Pt continues to require skilled PT to progress towards goal related activities.   OBJECTIVE IMPAIRMENTS difficulty walking,  decreased ROM, decreased strength, increased fascial restrictions, increased muscle spasms, impaired flexibility, and pain.   ACTIVITY LIMITATIONS carrying, lifting, bending, sitting, standing, squatting, sleeping, stairs, transfers, and bed mobility  PARTICIPATION LIMITATIONS: meal prep, cleaning, laundry, shopping, occupation, and yard work  PERSONAL FACTORS Fitness, Past/current experiences, and Profession are also affecting patient's functional outcome.   REHAB POTENTIAL: Good  CLINICAL DECISION MAKING: Stable/uncomplicated  EVALUATION COMPLEXITY: Low   GOALS: Goals reviewed with patient? Yes  SHORT TERM GOALS: Target date: 12/09/2021  Patient will be independent with initial HEP  Baseline: Goal status: MET  2.  Patient to report pain no greater than 2/10  Baseline:  Goal status: IN PROGRESS  LONG TERM GOALS: Target date: 01/06/2022   Patient to be independent with advanced HEP  Baseline:  Goal status: INITIAL  2.  Patient to report pain no greater than 2/10  Baseline:  Goal status: INITIAL  3.  Patient to be able to do her usual IADL's without having to take frequent rest breaks Baseline:  Goal status: INITIAL  4.  Patient to be able to do higher level indoor IADL's without frequent rest breaks Baseline:  Goal status: INITIAL     PLAN: PT FREQUENCY: 2x/week  PT DURATION: 8 weeks  PLANNED INTERVENTIONS: Therapeutic exercises, Therapeutic activity, Neuromuscular re-education, Balance training, Gait training, Patient/Family education, Self Care, Joint mobilization, Stair training, Aquatic Therapy, Dry Needling, Electrical stimulation, Cryotherapy, Moist heat, Taping, Traction, Ultrasound, Ionotophoresis 36m/ml Dexamethasone, Manual therapy, and Re-evaluation  PLAN FOR NEXT SESSION:  Nustep, progress flexibility and core strengthening.     JAnderson MaltaB. Yassir Enis, PT 11/27/21 2:50 PM   [address] [place] [address]Phone # [phone] [phone]

## 2021-12-01 ENCOUNTER — Ambulatory Visit: Payer: Medicare Other | Admitting: Rehabilitative and Restorative Service Providers"

## 2021-12-01 DIAGNOSIS — H40013 Open angle with borderline findings, low risk, bilateral: Secondary | ICD-10-CM | POA: Diagnosis not present

## 2021-12-05 ENCOUNTER — Ambulatory Visit: Payer: Medicare Other | Attending: Rheumatology | Admitting: Rehabilitative and Restorative Service Providers"

## 2021-12-05 ENCOUNTER — Encounter: Payer: Self-pay | Admitting: Rehabilitative and Restorative Service Providers"

## 2021-12-05 DIAGNOSIS — R252 Cramp and spasm: Secondary | ICD-10-CM | POA: Diagnosis not present

## 2021-12-05 DIAGNOSIS — M6281 Muscle weakness (generalized): Secondary | ICD-10-CM | POA: Insufficient documentation

## 2021-12-05 DIAGNOSIS — M25552 Pain in left hip: Secondary | ICD-10-CM | POA: Insufficient documentation

## 2021-12-05 DIAGNOSIS — R262 Difficulty in walking, not elsewhere classified: Secondary | ICD-10-CM | POA: Diagnosis not present

## 2021-12-05 NOTE — Therapy (Signed)
OUTPATIENT PHYSICAL THERAPY TREATMENT NOTE   Patient Name: Michelle Gray MRN: [id number] DOB:11-03-1955, 66 y.o., female Today's Date: 12/05/2021   PT End of Session - 12/05/21 1104     Visit Number 6    Date for PT Re-Evaluation 01/06/22    Authorization Type [employer]    PT Start Time 1100    PT Stop Time 1140    PT Time Calculation (min) 40 min    Activity Tolerance Patient tolerated treatment well    Behavior During Therapy [facility] for tasks assessed/performed             Past Medical History:  Diagnosis Date   Arthritis    Asthma    Cancer ([place]) 1990   right breast ca-mast with reconstr   Cancer [facility]) 12/2020   left breast DCIS   Eczema    dx by derm per patient   Fibromyalgia    Hyperlipidemia    Hypothyroidism    Neuromuscular disorder ([place])    Osteoporosis    PONV (postoperative nausea and vomiting)    Thyroid disease    Past Surgical History:  Procedure Laterality Date   APPENDECTOMY     BREAST IMPLANT REMOVAL Right 02/11/2021   Procedure: REMOVAL RIGHT BREAST IMPLANT;  Surgeon: Irene Limbo, MD;  Location: [place];  Service: Plastics;  Laterality: Right;   BREAST SURGERY Right 1990   Mastectomy   CAPSULECTOMY Right 02/11/2021   Procedure: CAPSULECTOMY;  Surgeon: Irene Limbo, MD;  Location: [place];  Service: Plastics;  Laterality: Right;   FOOT SURGERY     LAPROSCOPIC     TOTAL MASTECTOMY Left 02/11/2021   Procedure: LEFT TOTAL MASTECTOMY;  Surgeon: Rolm Bookbinder, MD;  Location: [place];  Service: General;  Laterality: Left;   [place]     Patient Active Problem List   Diagnosis Date Noted   Breast cancer, left breast ([place]) 02/11/2021   Cough variant asthma with ? component upper airway cough syndrome 12/23/2018   Primary osteoarthritis of both hands 02/21/2016   Fibromyalgia 02/21/2016   History of hypothyroidism 02/21/2016   Dyslipidemia 02/21/2016   History of breast  cancer 02/21/2016   Age-related osteoporosis without current pathological fracture 02/21/2016   Unspecified hypothyroidism 08/06/2013   Other and unspecified hyperlipidemia 08/06/2013   Insomnia 08/06/2013   Intrinsic asthma 08/06/2013   Hip pain, left 01/12/2011   Plantar fasciitis 01/12/2011    PCP: Cari Caraway, MD  REFERRING PROVIDER: Bo Merino, MD   REFERRING DIAG: M70.62 (ICD-10-CM) - Trochanteric bursitis of left hip M51.36 (ICD-10-CM) - DDD (degenerative disc disease), lumbar   THERAPY DIAG:  Muscle weakness (generalized)  Cramp and spasm  Difficulty in walking, not elsewhere classified  Pain in left hip  Rationale for Evaluation and Treatment Rehabilitation  ONSET DATE: 09/24/2021   SUBJECTIVE:   SUBJECTIVE STATEMENT: Pt reports that she took 4 days off from exercises and had a massage yesterday and is feeling better.  States that the bike and Nustep increase her pain.  PERTINENT HISTORY: See above.    PAIN:  Are you having pain? Yes: NPRS scale: current 1/10 Pain location: Left piriformis area Pain description: aching, referring Aggravating factors: standing Relieving factors: walking, rest  PRECAUTIONS: None  WEIGHT BEARING RESTRICTIONS No  FALLS:  Has patient fallen in last 6 months? No  LIVING ENVIRONMENT: Lives with: lives with their spouse Lives in: House/apartment Stairs: Yes: Internal: 16 steps; on left going up Has following equipment at  home: None  OCCUPATION: Nurse Practicioner  PLOF: Independent  PATIENT GOALS To be able to gain control of her symptoms again and know what exercises are appropriate for her current condition.     OBJECTIVE:   DIAGNOSTIC FINDINGS: MRI indicates multi level thoracic and lumbar stenosis.   PATIENT SURVEYS:  Eval:  FOTO do next visit.  11/25/2021:  FOTO 43% (projected to be 56% by visit 13)  COGNITION:  Overall cognitive status: Within functional limits for tasks  assessed     SENSATION: WFL   MUSCLE LENGTH: Hamstrings: Right 70 deg; Left 60 deg Thomas test: Right pos ; Left pos   POSTURE: No Significant postural limitations   LOWER EXTREMITY ROM:  WFL  LOWER EXTREMITY MMT:  Eval:  Generally 4- to 4/5  LOWER EXTREMITY SPECIAL TESTS:  Hip special tests: Saralyn Pilar (FABER) test: negative and Ober's test: negative  FUNCTIONAL TESTS:  Eval: 5 times sit to stand: 12.21 sec Timed up and go (TUG): 5.83 sec  GAIT: Distance walked: 50 Assistive device utilized: None Level of assistance: Complete Independence Comments: slightly antalgic    TODAY'S TREATMENT: 12/05/2021: Supine piriformis stretch 3 x 30 sec each side Supine posterior pelvic tilt (PPT) 2x10 PPT with 90/90 heel taps x 20 Bridging  2x10 Dying bug with red pball 2x10 Lower trunk rotation with feet on red pball 2x10 Supine hamstring, IT band, and hip adductor stretch with strap 2x20 sec bilat Prone hip extension 2x10 Prone quad stretch with strap 2x20 sec bilat (cuing to avoid aggressive stretch to decrease strain on knee) 3 way green pball rollout 5x10 sec each  Seated hip ER x10 bilat Seated hip IR L97 bilat Application of iontopatch with 67m/ml Dexamethasone with instructions to wear for 4 hours, then remove.   11/27/2021: Nustep level 4 x 5 min with PT present to discuss status Supine piriformis stretch 3 x 30 sec each side Supine IT band stretch 3 x 30 sec (also adductor stretch between each IT band stretch) Supine posterior pelvic tilt (PPT) 2x10 PPT with 90/90 heel taps x 20 Bridging  2x10 Sidelying clamshell  2x10 bilat Manual therapy: Addaday to left hip, sciatic notch, IT band, gluts, and low back 3 way green pball rollout 5x10 sec each   11/25/2021: Nustep level 4 x6 min with PT present to discuss status 3 way green pball rollout 5x10 sec each Seated piriformis stretch 2x20 sec bilat Initial FOTO 43% Supine hamstring, IT band, and hip adductor  stretch with strap 2x20 sec bilat Supine posterior pelvic tilt (PPT) 2x10 Supine PPT with marching with yellow loop around knees 2x10 bilat Bridging with yellow loop around knees 2x10 Sidelying clamshell with yellow loop 2x10 bilat Manual therapy in right sidelying:  Addaday to left piriformis, IT band/TFL, left glutes, left hamstring     PATIENT EDUCATION:  Education details: Initiated HEP Person educated: Patient Education method: EConsulting civil engineer DMedia planner Verbal cues, and Handouts Education comprehension: verbalized understanding, returned demonstration, and verbal cues required   HOME EXERCISE PROGRAM: Access Code: [email or web address]: [email or web address] Date: 11/12/2021 Prepared by: JCandyce Churn Exercises - Standing Hamstring Stretch on Chair  - 1 x daily - 7 x weekly - 1 sets - 3 reps - 30 sec hold - Standing Quad Stretch with Rotation  - 1 x daily - 7 x weekly - 1 sets - 3 reps - 30 hold - Supine Pelvic Tilt  - 1 x daily - 7 x weekly - 3 sets - 10 reps - Supine 90/90 Alternating Heel  Touches with Posterior Pelvic Tilt  - 1 x daily - 7 x weekly - 2 sets - 10 reps - Supine Dead Bug with Leg Extension  - 1 x daily - 7 x weekly - 2 sets - 10 reps - Supine Hip Internal and External Rotation  - 1 x daily - 7 x weekly - 1 sets - 5 reps - 10 sec hold - Supine ITB Stretch with Strap  - 1 x daily - 7 x weekly - 1 sets - 3 reps - 20 hold - Hooklying Clamshell with Resistance  - 1 x daily - 7 x weekly - 3 sets - 10 reps - Clamshell with Resistance  - 1 x daily - 7 x weekly - 3 sets - 10 reps  ASSESSMENT:  CLINICAL IMPRESSION: Ms. Daffern presents to skilled PT reporting that she feels the bike and nustep both cause increased knee pain.  Pt able to progress through exercises and stretching.  Pt educated to avoid aggressive stretching, as it could lead to the increased pain that she has been feeling.  Pt agreeable to trying iontopatch, as she has used it in the past with  good results.  Pt continues to require skilled PT to progress towards goal related activities.   OBJECTIVE IMPAIRMENTS difficulty walking, decreased ROM, decreased strength, increased fascial restrictions, increased muscle spasms, impaired flexibility, and pain.   ACTIVITY LIMITATIONS carrying, lifting, bending, sitting, standing, squatting, sleeping, stairs, transfers, and bed mobility  PARTICIPATION LIMITATIONS: meal prep, cleaning, laundry, shopping, occupation, and yard work  PERSONAL FACTORS Fitness, Past/current experiences, and Profession are also affecting patient's functional outcome.   REHAB POTENTIAL: Good  CLINICAL DECISION MAKING: Stable/uncomplicated  EVALUATION COMPLEXITY: Low   GOALS: Goals reviewed with patient? Yes  SHORT TERM GOALS: Target date: 12/09/2021  Patient will be independent with initial HEP  Baseline: Goal status: MET  2.  Patient to report pain no greater than 2/10  Baseline:  Goal status: IN PROGRESS  LONG TERM GOALS: Target date: 01/06/2022   Patient to be independent with advanced HEP  Baseline:  Goal status: INITIAL  2.  Patient to report pain no greater than 2/10  Baseline:  Goal status: INITIAL  3.  Patient to be able to do her usual IADL's without having to take frequent rest breaks Baseline:  Goal status: INITIAL  4.  Patient to be able to do higher level indoor IADL's without frequent rest breaks Baseline:  Goal status: INITIAL     PLAN: PT FREQUENCY: 2x/week  PT DURATION: 8 weeks  PLANNED INTERVENTIONS: Therapeutic exercises, Therapeutic activity, Neuromuscular re-education, Balance training, Gait training, Patient/Family education, Self Care, Joint mobilization, Stair training, Aquatic Therapy, Dry Needling, Electrical stimulation, Cryotherapy, Moist heat, Taping, Traction, Ultrasound, Ionotophoresis 63m/ml Dexamethasone, Manual therapy, and Re-evaluation  PLAN FOR NEXT SESSION:  assess response to iontopatch and apply  2nd patch if indicated, progress flexibility and core strengthening.     SJuel Burrow PT 12/05/21 11:52 AM   [facility] [address] [place] [address]Phone # [phone] [phone]

## 2021-12-10 ENCOUNTER — Ambulatory Visit: Payer: Medicare Other

## 2021-12-16 DIAGNOSIS — M8580 Other specified disorders of bone density and structure, unspecified site: Secondary | ICD-10-CM | POA: Diagnosis not present

## 2021-12-16 DIAGNOSIS — Z6826 Body mass index (BMI) 26.0-26.9, adult: Secondary | ICD-10-CM | POA: Diagnosis not present

## 2021-12-16 DIAGNOSIS — Z124 Encounter for screening for malignant neoplasm of cervix: Secondary | ICD-10-CM | POA: Diagnosis not present

## 2021-12-16 DIAGNOSIS — C50919 Malignant neoplasm of unspecified site of unspecified female breast: Secondary | ICD-10-CM | POA: Diagnosis not present

## 2021-12-16 DIAGNOSIS — Z01411 Encounter for gynecological examination (general) (routine) with abnormal findings: Secondary | ICD-10-CM | POA: Diagnosis not present

## 2021-12-16 DIAGNOSIS — N951 Menopausal and female climacteric states: Secondary | ICD-10-CM | POA: Diagnosis not present

## 2021-12-16 DIAGNOSIS — Z01419 Encounter for gynecological examination (general) (routine) without abnormal findings: Secondary | ICD-10-CM | POA: Diagnosis not present

## 2021-12-16 DIAGNOSIS — N952 Postmenopausal atrophic vaginitis: Secondary | ICD-10-CM | POA: Diagnosis not present

## 2021-12-17 ENCOUNTER — Ambulatory Visit: Payer: Medicare Other

## 2021-12-17 DIAGNOSIS — R262 Difficulty in walking, not elsewhere classified: Secondary | ICD-10-CM | POA: Diagnosis not present

## 2021-12-17 DIAGNOSIS — M6281 Muscle weakness (generalized): Secondary | ICD-10-CM | POA: Diagnosis not present

## 2021-12-17 DIAGNOSIS — R252 Cramp and spasm: Secondary | ICD-10-CM | POA: Diagnosis not present

## 2021-12-17 DIAGNOSIS — M25552 Pain in left hip: Secondary | ICD-10-CM

## 2021-12-17 NOTE — Therapy (Addendum)
OUTPATIENT PHYSICAL THERAPY TREATMENT NOTE  PHYSICAL THERAPY DISCHARGE SUMMARY  Visits from Start of Care: 7  Current functional level related to goals / functional outcomes: See below   Remaining deficits: See below   Education / Equipment: See below   Patient agrees to discharge. Patient goals were partially met. Patient is being discharged due to not returning since the last visit.    Patient Name: Michelle Gray MRN: [id number] DOB:03/01/1955, 66 y.o., female Today's Date: 12/17/2021   PT End of Session - 12/17/21 1231     Visit Number 7    Date for PT Re-Evaluation 01/06/22    Authorization Type Aetna    PT Start Time 1231    PT Stop Time 1315    PT Time Calculation (min) 44 min    Activity Tolerance Patient tolerated treatment well    Behavior During Therapy [facility] for tasks assessed/performed             Past Medical History:  Diagnosis Date   Arthritis    Asthma    Cancer ([place]) 1990   right breast ca-mast with reconstr   Cancer [facility]) 12/2020   left breast DCIS   Eczema    dx by derm per patient   Fibromyalgia    Hyperlipidemia    Hypothyroidism    Neuromuscular disorder ([place])    Osteoporosis    PONV (postoperative nausea and vomiting)    Thyroid disease    Past Surgical History:  Procedure Laterality Date   APPENDECTOMY     BREAST IMPLANT REMOVAL Right 02/11/2021   Procedure: REMOVAL RIGHT BREAST IMPLANT;  Surgeon: Irene Limbo, MD;  Location: [place];  Service: Plastics;  Laterality: Right;   BREAST SURGERY Right 1990   Mastectomy   CAPSULECTOMY Right 02/11/2021   Procedure: CAPSULECTOMY;  Surgeon: Irene Limbo, MD;  Location: [place];  Service: Plastics;  Laterality: Right;   FOOT SURGERY     LAPROSCOPIC     TOTAL MASTECTOMY Left 02/11/2021   Procedure: LEFT TOTAL MASTECTOMY;  Surgeon: Rolm Bookbinder, MD;  Location: [place];  Service: General;  Laterality: Left;   [place]     Patient Active Problem List   Diagnosis Date Noted   Breast cancer, left breast ([place]) 02/11/2021   Cough variant asthma with ? component upper airway cough syndrome 12/23/2018   Primary osteoarthritis of both hands 02/21/2016   Fibromyalgia 02/21/2016   History of hypothyroidism 02/21/2016   Dyslipidemia 02/21/2016   History of breast cancer 02/21/2016   Age-related osteoporosis without current pathological fracture 02/21/2016   Unspecified hypothyroidism 08/06/2013   Other and unspecified hyperlipidemia 08/06/2013   Insomnia 08/06/2013   Intrinsic asthma 08/06/2013   Hip pain, left 01/12/2011   Plantar fasciitis 01/12/2011    PCP: Cari Caraway, MD  REFERRING PROVIDER: Bo Merino, MD   REFERRING DIAG: M70.62 (ICD-10-CM) - Trochanteric bursitis of left hip M51.36 (ICD-10-CM) - DDD (degenerative disc disease), lumbar   THERAPY DIAG:  Muscle weakness (generalized)  Cramp and spasm  Difficulty in walking, not elsewhere classified  Pain in left hip  Rationale for Evaluation and Treatment Rehabilitation  ONSET DATE: 09/24/2021   SUBJECTIVE:   SUBJECTIVE STATEMENT: Pt reports that she is having increased right low back pain and right knee pain.  Also mentions some issues with her right shoulder.  Patient unsure why all of a sudden everything is flaring up.  Traveled via airplane out of town and had a fall  when her suitcase tipped over in a gully.  PERTINENT HISTORY: See above.    PAIN:  Are you having pain? Yes: NPRS scale: current 1/10 Pain location: Left piriformis area Pain description: aching, referring Aggravating factors: standing Relieving factors: walking, rest  PRECAUTIONS: None  WEIGHT BEARING RESTRICTIONS No  FALLS:  Has patient fallen in last 6 months? No  LIVING ENVIRONMENT: Lives with: lives with their spouse Lives in: House/apartment Stairs: Yes: Internal: 16 steps; on left going up Has following equipment at home:  None  OCCUPATION: Tour manager  PLOF: Independent  PATIENT GOALS To be able to gain control of her symptoms again and know what exercises are appropriate for her current condition.     OBJECTIVE:   DIAGNOSTIC FINDINGS: MRI indicates multi level thoracic and lumbar stenosis.   PATIENT SURVEYS:  Eval:  FOTO do next visit.  11/25/2021:  FOTO 43% (projected to be 56% by visit 13)  COGNITION:  Overall cognitive status: Within functional limits for tasks assessed     SENSATION: WFL   MUSCLE LENGTH: Hamstrings: Right 70 deg; Left 60 deg Thomas test: Right pos ; Left pos   POSTURE: No Significant postural limitations   LOWER EXTREMITY ROM:  WFL  LOWER EXTREMITY MMT:  Eval:  Generally 4- to 4/5  LOWER EXTREMITY SPECIAL TESTS:  Hip special tests: Saralyn Pilar (FABER) test: negative and Ober's test: negative  FUNCTIONAL TESTS:  Eval: 5 times sit to stand: 12.21 sec Timed up and go (TUG): 5.83 sec  GAIT: Distance walked: 50 Assistive device utilized: None Level of assistance: Complete Independence Comments: slightly antalgic    TODAY'S TREATMENT: 12/17/2021: Discussion about current pain and travel experiences/fall occurrence Seated physio ball rolls fwd, left then right x 10 each IFC estim to lumbar spine x 15 min with moist heat   12/05/2021: Supine piriformis stretch 3 x 30 sec each side Supine posterior pelvic tilt (PPT) 2x10 PPT with 90/90 heel taps x 20 Bridging  2x10 Dying bug with red pball 2x10 Lower trunk rotation with feet on red pball 2x10 Supine hamstring, IT band, and hip adductor stretch with strap 2x20 sec bilat Prone hip extension 2x10 Prone quad stretch with strap 2x20 sec bilat (cuing to avoid aggressive stretch to decrease strain on knee) 3 way green pball rollout 5x10 sec each  Seated hip ER x10 bilat Seated hip IR K09 bilat Application of iontopatch with 53m/ml Dexamethasone with instructions to wear for 4 hours, then  remove.   11/27/2021: Nustep level 4 x 5 min with PT present to discuss status Supine piriformis stretch 3 x 30 sec each side Supine IT band stretch 3 x 30 sec (also adductor stretch between each IT band stretch) Supine posterior pelvic tilt (PPT) 2x10 PPT with 90/90 heel taps x 20 Bridging  2x10 Sidelying clamshell  2x10 bilat Manual therapy: Addaday to left hip, sciatic notch, IT band, gluts, and low back 3 way green pball rollout 5x10 sec each     PATIENT EDUCATION:  Education details: Initiated HEP Person educated: Patient Education method: EConsulting civil engineer DMedia planner Verbal cues, and Handouts Education comprehension: verbalized understanding, returned demonstration, and verbal cues required   HOME EXERCISE PROGRAM: Access Code: [email or web address]: [email or web address] Date: 11/12/2021 Prepared by: JCandyce Churn Exercises - Standing Hamstring Stretch on Chair  - 1 x daily - 7 x weekly - 1 sets - 3 reps - 30 sec hold - Standing Quad Stretch with Rotation  - 1 x daily - 7 x weekly - 1 sets -  3 reps - 30 hold - Supine Pelvic Tilt  - 1 x daily - 7 x weekly - 3 sets - 10 reps - Supine 90/90 Alternating Heel Touches with Posterior Pelvic Tilt  - 1 x daily - 7 x weekly - 2 sets - 10 reps - Supine Dead Bug with Leg Extension  - 1 x daily - 7 x weekly - 2 sets - 10 reps - Supine Hip Internal and External Rotation  - 1 x daily - 7 x weekly - 1 sets - 5 reps - 10 sec hold - Supine ITB Stretch with Strap  - 1 x daily - 7 x weekly - 1 sets - 3 reps - 20 hold - Hooklying Clamshell with Resistance  - 1 x daily - 7 x weekly - 3 sets - 10 reps - Clamshell with Resistance  - 1 x daily - 7 x weekly - 3 sets - 10 reps  ASSESSMENT:  CLINICAL IMPRESSION: Ms. Croson presents to skilled PT reporting that she continues to feel pain in right knee and low back and is also having some pain in the right shoulder due to her fall while traveling this week.  She took a rest break prior to  traveling as she suspected she may be sore and have extra difficulty traveling.  She has done very little since she returned due to increased pain.  We did some basic lumbar stretches and finished with estim and heat due to patients pain level being elevated and her feeling the exercises were making her sore.   Pt continues to require skilled PT to progress towards goal related activities.   OBJECTIVE IMPAIRMENTS difficulty walking, decreased ROM, decreased strength, increased fascial restrictions, increased muscle spasms, impaired flexibility, and pain.   ACTIVITY LIMITATIONS carrying, lifting, bending, sitting, standing, squatting, sleeping, stairs, transfers, and bed mobility  PARTICIPATION LIMITATIONS: meal prep, cleaning, laundry, shopping, occupation, and yard work  PERSONAL FACTORS Fitness, Past/current experiences, and Profession are also affecting patient's functional outcome.   REHAB POTENTIAL: Good  CLINICAL DECISION MAKING: Stable/uncomplicated  EVALUATION COMPLEXITY: Low   GOALS: Goals reviewed with patient? Yes  SHORT TERM GOALS: Target date: 12/09/2021  Patient will be independent with initial HEP  Baseline: Goal status: MET  2.  Patient to report pain no greater than 2/10  Baseline:  Goal status: IN PROGRESS  LONG TERM GOALS: Target date: 01/06/2022   Patient to be independent with advanced HEP  Baseline:  Goal status: INITIAL  2.  Patient to report pain no greater than 2/10  Baseline:  Goal status: INITIAL  3.  Patient to be able to do her usual IADL's without having to take frequent rest breaks Baseline:  Goal status: INITIAL  4.  Patient to be able to do higher level indoor IADL's without frequent rest breaks Baseline:  Goal status: INITIAL     PLAN: PT FREQUENCY: 2x/week  PT DURATION: 8 weeks  PLANNED INTERVENTIONS: Therapeutic exercises, Therapeutic activity, Neuromuscular re-education, Balance training, Gait training, Patient/Family  education, Self Care, Joint mobilization, Stair training, Aquatic Therapy, Dry Needling, Electrical stimulation, Cryotherapy, Moist heat, Taping, Traction, Ultrasound, Ionotophoresis 3m/ml Dexamethasone, Manual therapy, and Re-evaluation  PLAN FOR NEXT SESSION:  did not re-apply 2nd patch (patient pain level poorly controlled), progress flexibility and core strengthening.     JAnderson MaltaB. Yamile Roedl, PT 12/17/21 4:34 PM  [facility] [address] [place] [address]Phone # [phone] [phone]

## 2021-12-18 ENCOUNTER — Other Ambulatory Visit: Payer: Self-pay

## 2021-12-18 ENCOUNTER — Other Ambulatory Visit: Payer: Self-pay | Admitting: *Deleted

## 2021-12-18 MED ORDER — PREDNISONE 5 MG PO TABS: ORAL_TABLET | ORAL | 0 refills | Status: DC

## 2021-12-18 MED ORDER — MELOXICAM 15 MG PO TABS: 15.0000 mg | ORAL_TABLET | Freq: Every day | ORAL | 0 refills | Status: DC

## 2021-12-18 NOTE — Telephone Encounter (Signed)
Refill request received via fax from [facility] for Mobic.   Next Visit: 03/23/2022  Last Visit: 09/24/2021  Last Fill: 10/02/2021  DX: Fibromyalgia   Current Dose per office note 09/24/2021: not discussed  Labs: 09/22/2021 WNL  Okay to refill Mobic?

## 2021-12-18 NOTE — Telephone Encounter (Signed)
Attempted to contact patient and left message to advise we are sending a prednisone taper to the pharmacy. Also advised the patient Side effects of prednisone include-increase in blood pressure, increase in blood glucose level, weight gain, osteoporosis, hyperlipidemia.   Please review prescription and send to the pharmacy. Thanks!

## 2021-12-18 NOTE — Telephone Encounter (Signed)
Patient called the office complaining of low back pain. Patient has been going to PT for the back pain (we referred her) and she does not believe that is helping. Patient denies any fall or injuries. She has been taking tylenol and robaxin and has not gotten any relief. Patient is traveling next week and is requesting a prednisone taper to be sent to [facility] on [address]. Patient is scheduled for a follow up on 03/23/2022. According to the chart, patient has not been prescribed prednisone recently. Please advise.

## 2021-12-18 NOTE — Telephone Encounter (Signed)
Please call in prednisone 5 mg tablets.  She can take 4 tablets x 2 and taper by 5 mg every 2 days.  Side effects of prednisone include-increase in blood pressure, increase in blood glucose level, weight gain, osteoporosis, hyperlipidemia.

## 2021-12-22 ENCOUNTER — Ambulatory Visit: Payer: Medicare Other

## 2021-12-23 DIAGNOSIS — M545 Low back pain, unspecified: Secondary | ICD-10-CM | POA: Diagnosis not present

## 2022-01-02 ENCOUNTER — Other Ambulatory Visit: Payer: Self-pay | Admitting: Rheumatology

## 2022-01-02 NOTE — Telephone Encounter (Signed)
Next Visit: 03/23/2022   Last Visit: 09/24/2021   Last Fill: 10/02/2021  DX: Fibromyalgia   Current Dose per office note 09/24/2021: gabapentin 300 mg  Okay to refill Gabapentin?

## 2022-01-05 ENCOUNTER — Telehealth: Payer: Self-pay | Admitting: Cardiology

## 2022-01-05 ENCOUNTER — Encounter: Payer: Medicare Other | Admitting: Rehabilitative and Restorative Service Providers"

## 2022-01-05 ENCOUNTER — Ambulatory Visit: Payer: 59 | Attending: Nurse Practitioner | Admitting: Nurse Practitioner

## 2022-01-05 ENCOUNTER — Ambulatory Visit (INDEPENDENT_AMBULATORY_CARE_PROVIDER_SITE_OTHER): Payer: 59

## 2022-01-05 ENCOUNTER — Encounter: Payer: Self-pay | Admitting: Nurse Practitioner

## 2022-01-05 VITALS — BP 134/72 | HR 85 | Ht 67.0 in | Wt 162.6 lb

## 2022-01-05 DIAGNOSIS — R002 Palpitations: Secondary | ICD-10-CM | POA: Diagnosis not present

## 2022-01-05 DIAGNOSIS — I2510 Atherosclerotic heart disease of native coronary artery without angina pectoris: Secondary | ICD-10-CM | POA: Diagnosis not present

## 2022-01-05 DIAGNOSIS — E785 Hyperlipidemia, unspecified: Secondary | ICD-10-CM

## 2022-01-05 NOTE — Progress Notes (Unsigned)
Enrolled for [employer] to mail a ZIO XT long term holter monitor to the patients address on file.  ? ?Dr. Schumann to read. ?

## 2022-01-05 NOTE — Patient Instructions (Addendum)
Medication Instructions:  Your physician recommends that you continue on your current medications as directed. Please refer to the Current Medication list given to you today.   *If you need a refill on your cardiac medications before your next appointment, please call your pharmacy*   Lab Work: NONE ordered at this time of appointment   If you have labs (blood work) drawn today and your tests are completely normal, you will receive your results only by: [place] (if you have MyChart) OR A paper copy in the mail If you have any lab test that is abnormal or we need to change your treatment, we will call you to review the results.   Testing/Procedures: ZIO AT Long term monitor-Live Telemetry  Your physician has requested you wear a ZIO patch monitor for 14 days.  This is a single patch monitor. [employer] supplies one patch monitor per enrollment. Additional  stickers are not available.  Please do not apply patch if you will be having a Nuclear Stress Test, Echocardiogram, Cardiac CT, MRI,  or Chest Xray during the period you would be wearing the monitor. The patch cannot be worn during  these tests. You cannot remove and re-apply the ZIO AT patch monitor.  Your ZIO patch monitor will be mailed 3 day USPS to your address on file. It may take 3-5 days to  receive your monitor after you have been enrolled.  Once you have received your monitor, please review the enclosed instructions. Your monitor has  already been registered assigning a specific monitor serial # to you.   Billing and Patient Assistance Program information  Theodore Demark has been supplied with any insurance information on record for billing. [employer] offers a sliding scale Patient Assistance Program for patients without insurance, or whose  insurance does not completely cover the cost of the ZIO patch monitor. You must apply for the  Patient Assistance Program to qualify for the discounted rate. To apply, call [employer] at  [phone],  select option 4, select option 2 , ask to apply for the Patient Assistance Program, (you can request an  interpreter if needed). [employer] will ask your household income and how many people are in your  household. [employer] will quote your out-of-pocket cost based on this information. They will also be able  to set up a 12 month interest free payment plan if needed.  Applying the monitor   Shave hair from upper left chest.  Hold the abrader disc by orange tab. Rub the abrader in 40 strokes over left upper chest as indicated in  your monitor instructions.  Clean area with 4 enclosed alcohol pads. Use all pads to ensure the area is cleaned thoroughly. Let  dry.  Apply patch as indicated in monitor instructions. Patch will be placed under collarbone on left side of  chest with arrow pointing upward.  Rub patch adhesive wings for 2 minutes. Remove the white label marked "1". Remove the white label  marked "2". Rub patch adhesive wings for 2 additional minutes.  While looking in a mirror, press and release button in center of patch. A small green light will flash 3-4  times. This will be your only indicator that the monitor has been turned on.  Do not shower for the first 24 hours. You may shower after the first 24 hours.  Press the button if you feel a symptom. You will hear a small click. Record Date, Time and Symptom in  the Patient Log.   Starting the [employer]  In your kit there is a small plastic box the size of a cellphone. This is Airline pilot. It transmits all your  recorded data to [facility]. This box must always stay within 10 feet of you. Open the box and push the *  button. There will be a light that blinks orange and then green a few times. When the light stops  blinking, the Gateway is connected to the ZIO patch. Call [employer] at [phone] to confirm your monitor is transmitting.  Returning your monitor  Remove your patch and place it inside the [place]. In the  lower half of the Gateway there is a white  bag with prepaid postage on it. Place Gateway in bag and seal. Mail package back to [place] as soon as  possible. Your physician should have your final report approximately 7 days after you have mailed back  your monitor. Call [place] at [phone] if you have questions regarding your ZIO AT  patch monitor. Call them immediately if you see an orange light blinking on your monitor.  If your monitor falls off in less than 4 days, contact our Monitor department at [phone]. If your  monitor becomes loose or falls off after 4 days call [employer] at [phone] for suggestions on  securing your monitor    Follow-Up: At [facility], you and your health needs are our priority.  As part of our continuing mission to provide you with exceptional heart care, we have created designated Provider Care Teams.  These Care Teams include your primary Cardiologist (physician) and Advanced Practice Providers (APPs -  Physician Assistants and Nurse Practitioners) who all work together to provide you with the care you need, when you need it.  We recommend signing up for the patient portal called "MyChart".  Sign up information is provided on this After Visit Summary.  MyChart is used to connect with patients for Virtual Visits (Telemedicine).  Patients are able to view lab/test results, encounter notes, upcoming appointments, etc.  Non-urgent messages can be sent to your provider as well.   To learn more about what you can do with MyChart, go to [email or web address].    Your next appointment:    Keep follow up   The format for your next appointment:   In Person  Provider:   Donato Heinz, MD     Other Instructions   Important Information About [place]

## 2022-01-05 NOTE — Telephone Encounter (Signed)
Patient c/o Palpitations:  High priority if patient c/o lightheadedness, shortness of breath, or chest pain  How long have you had palpitations/irregular HR/ Afib? Are you having the symptoms now? Last 4 or 5 days -- No, coming and going.   Are you currently experiencing lightheadedness, SOB or CP? No   Do you have a history of afib (atrial fibrillation) or irregular heart rhythm? Yes   Have you checked your BP or HR? (document readings if available): HR 78  143/82   Are you experiencing any other symptoms? No.

## 2022-01-05 NOTE — Progress Notes (Signed)
Office Visit    Patient Name: Michelle Gray Date of Encounter: 01/05/2022  Primary Care Provider:  Cari Caraway, MD Primary Cardiologist:  Donato Heinz, MD  Chief Complaint    66 year old female with a history of nonobstructive CAD, palpitations, hyperlipidemia, breast cancer, fibromyalgia, hypothyroidism, and asthma who presents for follow-up related to palpitations.  Past Medical History    Past Medical History:  Diagnosis Date   Arthritis    Asthma    Cancer ([place]) 1990   right breast ca-mast with reconstr   Cancer [facility]) 12/2020   left breast DCIS   Eczema    dx by derm per patient   Fibromyalgia    Hyperlipidemia    Hypothyroidism    Neuromuscular disorder ([place])    Osteoporosis    PONV (postoperative nausea and vomiting)    Thyroid disease    Past Surgical History:  Procedure Laterality Date   APPENDECTOMY     BREAST IMPLANT REMOVAL Right 02/11/2021   Procedure: REMOVAL RIGHT BREAST IMPLANT;  Surgeon: Irene Limbo, MD;  Location: [place];  Service: Plastics;  Laterality: Right;   BREAST SURGERY Right 1990   Mastectomy   CAPSULECTOMY Right 02/11/2021   Procedure: CAPSULECTOMY;  Surgeon: Irene Limbo, MD;  Location: [place];  Service: Plastics;  Laterality: Right;   FOOT SURGERY     LAPROSCOPIC     TOTAL MASTECTOMY Left 02/11/2021   Procedure: LEFT TOTAL MASTECTOMY;  Surgeon: Rolm Bookbinder, MD;  Location: [place];  Service: General;  Laterality: Left;   WISDOM TEETH REMOVAL      Allergies  Allergies  Allergen Reactions   Compazine    Tape Rash    Allergy to adhesive per patient.     History of Present Illness    66 year old female with the above past medical history including nonobstructive CAD, palpitations, hyperlipidemia, breast cancer, fibromyalgia, hypothyroidism, and asthma.  She was initially referred to cardiology in November 2020 for the evaluation of palpitations  and chest pain.  She had episodes where she felt that her heart was beating irregularly.  She also reported chest pressure at the time, not associated with exertion.  Echocardiogram in 12/2018 showed normal LV systolic function, normal RV function, no significant valvular disease.  Cardiac monitor showed no significant arrhythmia, patient triggered events corresponding to sinus rhythm plus or minus PACs.  Coronary CTA in 01/22/2019 showed coronary calcium score of 32 (73rd percentile), nonobstructive CAD with calcified plaque in the proximal LAD causing minimal (0 to 24%) stenosis and noncalcified plaque in the proximal RCA causing minimal (0 to 24%) stenosis.  Rosuvastatin was increased to 40 mg daily.  She was last seen in the office on 04/09/2021 and was stable from a cardiac standpoint.  She was s/p mastectomy for left breast DCIS.  She contacted our office on 01/05/2022 and noted a 4 to 5-day history of intermittent palpitations.   She presents today for follow-up.  Since her last visit she has been stable from a cardiac standpoint.  She recently traveled to [place] with a group of friends and acquired a upper respiratory infection from which she is still recovering.  She did note some coughing and used her albuterol inhaler more regularly.  Over the past few days, she has noticed an increased frequency in palpitations she describes as feeling like she "needs to cough" followed by a fluttering in her chest that feels like "skipped beats." Her symptoms last for 15 to 30  seconds at a time, she notes occasional dizziness, denies any other associated symptoms.  She states these symptoms are similar to her prior symptoms when she was noted to have PACs. Other than her recent palpitations, she denies any additional concerns today.  Home Medications    Current Outpatient Medications  Medication Sig Dispense Refill   5-HTP CAPS Take 2 tablets by mouth at bedtime.     albuterol (PROVENTIL HFA;VENTOLIN HFA)  108 (90 BASE) MCG/ACT inhaler Inhale 2 puffs into the lungs every 6 (six) hours as needed for wheezing or shortness of breath.     Biotin 5000 MCG CAPS Take 5,000 mcg by mouth daily.     budesonide-formoterol (SYMBICORT) 160-4.5 MCG/ACT inhaler Inhale 2 puffs into the lungs in the morning and at bedtime. 1 each 11   Cholecalciferol (VITAMIN D3) 5000 units CAPS Take 7,000 Units by mouth once a week.     Coenzyme Q10 (COQ-10) 100 MG CAPS      ezetimibe (ZETIA) 10 MG tablet Take 1 tablet (10 mg total) by mouth daily. 90 tablet 3   gabapentin (NEURONTIN) 300 MG capsule TAKE TWO CAPSULES BY MOUTH AT BEDTIME 60 capsule 2   Glucosamine 500 MG TABS Take 1,500 mg by mouth daily.     levothyroxine (SYNTHROID, LEVOTHROID) 50 MCG tablet 50 mcg 6 days weekly.     levothyroxine (SYNTHROID, LEVOTHROID) 75 MCG tablet Take 75 mcg by mouth once a week.     Magnesium 500 MG CAPS Take 500 mg by mouth daily.     meloxicam (MOBIC) 15 MG tablet Take 1 tablet (15 mg total) by mouth daily. 90 tablet 0   methocarbamol (ROBAXIN) 750 MG tablet Take 1 tablet (750 mg total) by mouth at bedtime as needed for muscle spasms. 90 tablet 0   Misc Natural Products (TART CHERRY ADVANCED PO) Take by mouth.     mometasone (ELOCON) 0.1 % cream SMARTSIG:1 Topical Daily     rosuvastatin (CRESTOR) 20 MG tablet Take 1 tablet (20 mg total) by mouth daily. 90 tablet 3   zolpidem (AMBIEN) 10 MG tablet Take 1 tablet (10 mg total) by mouth at bedtime. 30 tablet 2   predniSONE (DELTASONE) 5 MG tablet Take 4 tabs po qd x 2 days, 3  tabs po qd x 2 days, 2  tabs po qd x 2 days, 1  tab po qd x 2 days (Patient not taking: Reported on 01/05/2022) 20 tablet 0   No current facility-administered medications for this visit.     Review of Systems   She denies chest pain, dyspnea, pnd, orthopnea, n, v, dizziness, syncope, edema, weight gain, or early satiety. All other systems reviewed and are otherwise negative except as noted above.   Physical Exam     VS:  BP 134/72   Pulse 85   Ht '5\' 7"'$  (1.702 m)   Wt 162 lb 9.6 oz (73.8 kg)   SpO2 99%   BMI 25.47 kg/m   GEN: Well nourished, well developed, in no acute distress. HEENT: normal. Neck: Supple, no JVD, carotid bruits, or masses. Cardiac: RRR, no murmurs, rubs, or gallops. No clubbing, cyanosis, edema.  Radials/DP/PT 2+ and equal bilaterally.  Respiratory:  Respirations regular and unlabored, clear to auscultation bilaterally. GI: Soft, nontender, nondistended, BS + x 4. MS: no deformity or atrophy. Skin: warm and dry, no rash. Neuro:  Strength and sensation are intact. Psych: Normal affect.  Accessory Clinical Findings    ECG personally reviewed by me today -NSR,  85 bpm, LAD- no acute changes.   Lab Results  Component Value Date   WBC 16.8 (H) 08/06/2013   HGB 13.4 08/06/2013   HCT 37.7 08/06/2013   MCV 95.4 08/06/2013   PLT 391 08/06/2013   No results found for: "CREATININE", "BUN", "NA", "K", "CL", "CO2" No results found for: "ALT", "AST", "GGT", "ALKPHOS", "BILITOT" No results found for: "CHOL", "HDL", "LDLCALC", "LDLDIRECT", "TRIG", "CHOLHDL"  No results found for: "HGBA1C"  Assessment & Plan   1. Palpitations: Cardiac monitor in 2020 showed no significant arrhythmia, PACs.  She notes a 4 to 5-day history of intermittent palpitations which she describes as a fluttering in her chest that feels like "skipped beats."  Symptoms last for 15 to 30 seconds at a time, no significant associated symptoms.  Will repeat 14-day Zio patch.  Discussed possible treatment with beta-blocker therapy, will await monitor results.  Discussed ED precautions.  2. Nonobstructive CAD: Coronary CTA in 01/2019 showed nonobstructive CAD.  Stable with no anginal symptoms. No indication for ischemic evaluation.  Continue Crestor, Zetia.  3. Hyperlipidemia: LDL was 69 in 09/2020.  Monitored and managed per PCP.  Continue Crestor, Zetia.  4. Disposition: Follow-up as scheduled with Dr. Gardiner Rhyme in  March 2024, sooner if needed.     Lenna Sciara, NP 01/05/2022, 12:25 PM

## 2022-01-05 NOTE — Telephone Encounter (Signed)
Patient stated that last Tuesday or Wednesday, she started having palpitations that were worse than 3 years ago. According to patient, she believes they are PACs with some afib (some are irregular). The palpitations last 30-60 seconds. Her breathing is tight, but she thinks it's form the cold and her asthma. Stated dizziness on occasion, but might be due to anxiety. While on phone, BP 121/72, P 80. She is getting over a 2-week cold/congestion. Denies fevers or fatigue. She has appointment with E. Monge this AM. Scheduled her 33-monthf/u with Dr. SGardiner Rhymefor March.

## 2022-01-07 ENCOUNTER — Ambulatory Visit: Payer: Medicare Other | Admitting: Physician Assistant

## 2022-01-09 ENCOUNTER — Encounter: Payer: Self-pay | Admitting: Cardiology

## 2022-01-09 ENCOUNTER — Telehealth: Payer: Self-pay | Admitting: Nurse Practitioner

## 2022-01-09 DIAGNOSIS — R002 Palpitations: Secondary | ICD-10-CM | POA: Diagnosis not present

## 2022-01-09 MED ORDER — METOPROLOL TARTRATE 25 MG PO TABS: 12.5000 mg | ORAL_TABLET | Freq: Two times a day (BID) | ORAL | 1 refills | Status: DC

## 2022-01-09 NOTE — Telephone Encounter (Signed)
Discussed with Raquel Sarna, NP she states that pt was only to have regular monitor not the live.  Informed pt that she was not supposed to have the live monitor, verbalized understanding. she states that she would like to have this monitor on for "a couple days then send it back" informed pt that she should wear the whole 14 days as ordered. Informed pt I will send to [facility] for further information. Verbalized understanding. She would also like the Metoprolol rx to have "just in case she needs it" please advise.

## 2022-01-09 NOTE — Telephone Encounter (Signed)
See recent telephone call message

## 2022-01-09 NOTE — Telephone Encounter (Signed)
Michelle Sciara, NP  Waylan Rocher, LPN Caller: Unspecified (Today,  8:15 AM) I would like her to wear the monitor for as long as she can. She can start metoprolol tartrate 12.5 mg bid. Thanks, EM Pt informed of providers result & recommendations. Pt verbalized understanding. All questions, if any, were answered. Rx sent to requested pharmacy

## 2022-01-09 NOTE — Telephone Encounter (Signed)
Patient is calling about the monitor she received.  She received the Zio XT model, she states Raquel Sarna note states she should get a long-term monitor live telemetry which is not this model she received.  She wants to know if she can send it back after 3-5 days, since she is having so many symptoms. .  She would also like to get the script for the metoprolol, they has discussed it at her visit, but decided to hold off.

## 2022-01-12 DIAGNOSIS — M85852 Other specified disorders of bone density and structure, left thigh: Secondary | ICD-10-CM | POA: Diagnosis not present

## 2022-01-12 DIAGNOSIS — M85851 Other specified disorders of bone density and structure, right thigh: Secondary | ICD-10-CM | POA: Diagnosis not present

## 2022-01-12 DIAGNOSIS — Z780 Asymptomatic menopausal state: Secondary | ICD-10-CM | POA: Diagnosis not present

## 2022-01-14 ENCOUNTER — Telehealth: Payer: Self-pay | Admitting: *Deleted

## 2022-01-14 NOTE — Telephone Encounter (Signed)
Received DEXA results from [facility].  Date of Scan: 01/12/2022  Lowest T-score:-2.3  BMD:0.595  Lowest site measured:Left Femoral Neck  DX: Osteopenia  Significant changes in BMD and site measured (5% and above):7% Right Total Femur, 8% AP Total Spine  Current Regimen:Vitamin D   Recommendation:Improvement Noted. Will discuss at the follow up visit.   Reviewed by:Dr. Bo Merino   Next Appointment:  03/23/2022

## 2022-01-20 DIAGNOSIS — R002 Palpitations: Secondary | ICD-10-CM | POA: Diagnosis not present

## 2022-01-20 NOTE — Telephone Encounter (Signed)
Patient calling back to say that she is still having palpitation and that she is having side effect from the medication. Please advise

## 2022-01-20 NOTE — Telephone Encounter (Signed)
Patient stated she could only wear the heart monitor for 8 days due to allergic reaction to the adhesive. She started metoprolol tartrate 12.55m twice daily four days ago. Since then, she has experienced more palpitations, diarrhea, nausea, and lightheadedness. She denise SOB, other than her usual asthma. While on phone, BP 140/79, P 87; O2 sat 99%. Patient is staying hydrated (6 bottles of water each day), had ETOH over weekend at parties, avoids caffeine. Please advise on palpitations and side effects of met tart.

## 2022-01-20 NOTE — Telephone Encounter (Signed)
Spoke to patient   Information given per Diona Browner, Helane Gunther, NP   Ok to stop metoprolol. Will await monitor results. Thank you.     Patient  states she called  to have the monitor expeditate to review.   RN advise patient to hold on her travels tomorrow to see in monitor result  come in  Call office about midday  Patient states she was concerned of her s\reaction to the medications

## 2022-01-20 NOTE — Telephone Encounter (Signed)
° °  Pt is calling back to f/u °

## 2022-01-21 ENCOUNTER — Telehealth: Payer: Self-pay | Admitting: Cardiology

## 2022-01-21 NOTE — Telephone Encounter (Signed)
See duplicate phone note 

## 2022-01-21 NOTE — Telephone Encounter (Signed)
Pt is returning call. Transferred to Hilda Blades, Therapist, sports.

## 2022-01-21 NOTE — Telephone Encounter (Signed)
Pt calling to f/u on Heart Monitor results. Please advise 

## 2022-01-21 NOTE — Telephone Encounter (Signed)
Left message for pt to call.

## 2022-01-21 NOTE — Telephone Encounter (Signed)
Spoke with pt, she is aware of the monitor results, she is confused because what she read was mostly ventricular ectopy not SVT. She does not want to try metoprolol again and she has looked at all the rhythm strips and she saw VE not SVT. she would like to be seen or talk to dr Gardiner Rhyme or emily before starting any medications.

## 2022-01-21 NOTE — Telephone Encounter (Signed)
Monitor shows frequent episodes of SVT.  Sounds like she did not tolerate Lopressor.  Could trial Toprol-XL to see if she is able to tolerate. Would start with just 12.5 mg Toprol-XL

## 2022-01-22 ENCOUNTER — Telehealth: Payer: Self-pay | Admitting: Cardiology

## 2022-01-22 DIAGNOSIS — R9431 Abnormal electrocardiogram [ECG] [EKG]: Secondary | ICD-10-CM

## 2022-01-22 DIAGNOSIS — R002 Palpitations: Secondary | ICD-10-CM

## 2022-01-22 DIAGNOSIS — I471 Supraventricular tachycardia, unspecified: Secondary | ICD-10-CM

## 2022-01-22 MED ORDER — METOPROLOL SUCCINATE ER 25 MG PO TB24: 12.5000 mg | ORAL_TABLET | Freq: Every day | ORAL | 3 refills | Status: DC

## 2022-01-22 NOTE — Telephone Encounter (Signed)
   Patient Name: Michelle Gray  DOB: 07-22-1955 MRN: [id number]  Primary Cardiologist: Donato Heinz, MD  Discussed monitor results with patient over the phone. She is agreeable to trial metoprolol succinate 12.5 mg daily.  She is concerned that given viral etiology coinciding with the timing of her increased palpitations, she is concerned that she may have an element of cardiomyopathy.  Patient is interested in pursuing echocardiogram.  It has been over 3 years since her last echocardiogram.  Will arrange for repeat echocardiogram.  She will update [place] on her symptoms with initiation of metoprolol.  I advised her to keep her appointment with Dr. Gardiner Rhyme in March 2024. Patient verbalized understanding.   Lenna Sciara, NP 01/22/2022, 12:47 PM

## 2022-01-22 NOTE — Telephone Encounter (Signed)
Noted  

## 2022-01-22 NOTE — Telephone Encounter (Signed)
Pt states she would like to talk to [facility] in regards to heart monitor results.

## 2022-01-22 NOTE — Telephone Encounter (Signed)
Echo ordered. Metoprolol succinate 12.5 mg daily ordered. Metoprolol tartrate discontinued.

## 2022-01-22 NOTE — Addendum Note (Signed)
Addended by: Betha Loa F on: 01/22/2022 01:18 PM   Modules accepted: Orders

## 2022-01-30 DIAGNOSIS — M2022 Hallux rigidus, left foot: Secondary | ICD-10-CM | POA: Diagnosis not present

## 2022-02-03 ENCOUNTER — Other Ambulatory Visit: Payer: Self-pay | Admitting: Rheumatology

## 2022-02-03 DIAGNOSIS — M25512 Pain in left shoulder: Secondary | ICD-10-CM | POA: Diagnosis not present

## 2022-02-03 DIAGNOSIS — M79641 Pain in right hand: Secondary | ICD-10-CM | POA: Diagnosis not present

## 2022-02-03 DIAGNOSIS — M25561 Pain in right knee: Secondary | ICD-10-CM | POA: Diagnosis not present

## 2022-02-03 NOTE — Telephone Encounter (Signed)
Next Visit: 03/23/2022   Last Visit: 09/24/2021   Last Fill: 11/06/2021   Dx: Primary insomnia    Current Dose per office note on 09/24/2021: Ambien 10 mg p.o. nightly.  She is unable to sleep without Ambien.   Okay to refill Ambien?

## 2022-02-04 NOTE — Telephone Encounter (Signed)
Metoprolol would help with both ventricular ectopy and SVT.  Recommend trial of Toprol-XL 12.5 mg daily

## 2022-02-09 ENCOUNTER — Telehealth: Payer: Self-pay | Admitting: Nurse Practitioner

## 2022-02-09 ENCOUNTER — Other Ambulatory Visit: Payer: Self-pay

## 2022-02-09 MED ORDER — METOPROLOL SUCCINATE ER 25 MG PO TB24: 25.0000 mg | ORAL_TABLET | Freq: Every day | ORAL | 2 refills | Status: DC

## 2022-02-09 NOTE — Telephone Encounter (Signed)
Pt c/o medication issue:  1. Name of Medication: metoprolol succinate (TOPROL XL) 25 MG 24 hr tablet   2. How are you currently taking this medication (dosage and times per day)? Take 0.5 tablets (12.5 mg total) by mouth daily.   3. Are you having a reaction (difficulty breathing--STAT)?   4. What is your medication issue? Patient states she is still having some PVC's she wants to know if she should increase it to taking 1/2 tablet twice a day or one pill once a day.  It's been about two weeks since started taking the medication.

## 2022-02-09 NOTE — Telephone Encounter (Signed)
Contacted patient, she states that she has been taking the Metoprolol for about 2 weeks. She states she is still noticing the issues and having episodes. She states they are not as bad, but she is still having many episodes.   She states she is questioning if she can try to take a whole tablet at once, or if she can do half a tablet twice a day to see if it helps. Patient states that she is limiting her caffeine/alcohol and is watching what she drinks/eats.  BP today was 138/77 HR 78.   Will route to MD/NP to review if okay.

## 2022-02-09 NOTE — Telephone Encounter (Signed)
Spoke with pt. Pt is aware that it's ok to start Metoprolol 25 mg daily to see if she tolerates it. Pt will monitor symptoms and call back if needed.

## 2022-02-09 NOTE — Telephone Encounter (Signed)
Updated RX.   Patient made aware. Verbalized understanding  Thank you!

## 2022-02-16 ENCOUNTER — Telehealth: Payer: Self-pay

## 2022-02-16 ENCOUNTER — Other Ambulatory Visit: Payer: Self-pay

## 2022-02-16 DIAGNOSIS — M25561 Pain in right knee: Secondary | ICD-10-CM | POA: Diagnosis not present

## 2022-02-16 MED ORDER — METOPROLOL SUCCINATE ER 50 MG PO TB24: 50.0000 mg | ORAL_TABLET | Freq: Every day | ORAL | 0 refills | Status: DC

## 2022-02-16 NOTE — Telephone Encounter (Signed)
Patient walked in and left message at the front desk that recent dose increase of Metoprolol Succinate to '25mg'$  is not showing a significant improvement in palpitations. Called and spoke with patient who states that she is still having palpitations and states that she had a lot yesterday but not as much today. Patient also has Echo scheduled for this Friday 1/19. Patient feels that she is well hydrated and avoids Caffeine.  Patient states that she would like to know if her medication need to be increased or if she should continue current dose and await Echo results. Advised patient I would forward message over to Diona Browner NP.

## 2022-02-16 NOTE — Telephone Encounter (Signed)
Spoke with pt. Pt is aware that if BP and Heart rate is ok, she can increase to Metoprolol 50 mg daily and proceed with Echocardiogram. Per pt, she would like a 30 day supply called in of the Metoprolol 50 mg.

## 2022-02-18 IMAGING — MR MR BREAST BX W LOC DEV 1ST LESION IMAGE BX SPEC MR GUIDE*L*
7 of 10 series · 33 of 48 positions shown · IV contrast (gadavist)
Comparison: Previous exams.
COMPARISON: Previous exams.

Addendum:
CLINICAL DATA: Patient presents for MRI guided biopsy of a 1.1 cm
enhancing mass in the left breast. Remote history of a right
mastectomy for breast carcinoma.

EXAM:
MRI GUIDED CORE NEEDLE BIOPSY OF THE LEFT BREAST
TECHNIQUE: Multiplanar, multisequence MR imaging of the left breast was
performed both before and after administration of intravenous
contrast.
CONTRAST:  Seven mL of Gadavist, intravenously.

[Series 2: fiducial unilateral · sagittal · 2.0mm · 1.33mm/px · 3 of 52 slices shown]
[im 1/52]
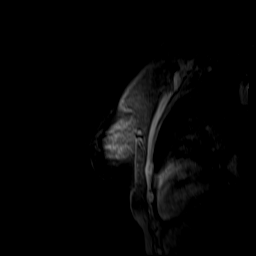
[im 26/52]
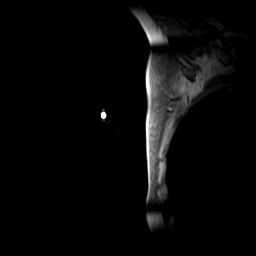
[im 52/52]
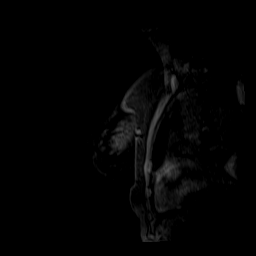

[Series 3: dynamic pre · axial · non-contrast · 1.3mm · 0.73mm/px · z∈[-119,+67]mm · 5 of 144 slices shown]
[im 1/144]
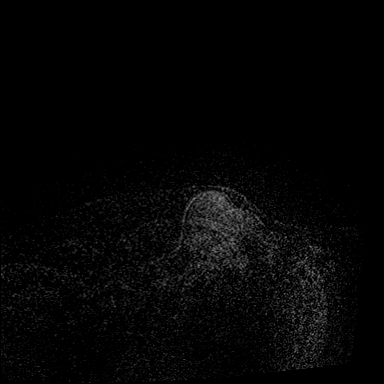
[im 36/144]
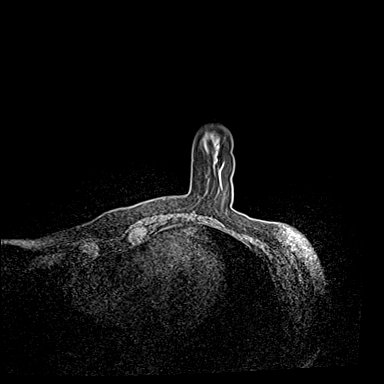
[im 72/144]
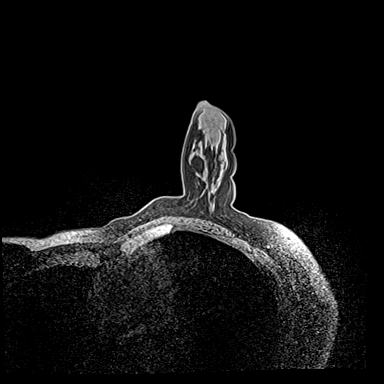
[im 108/144]
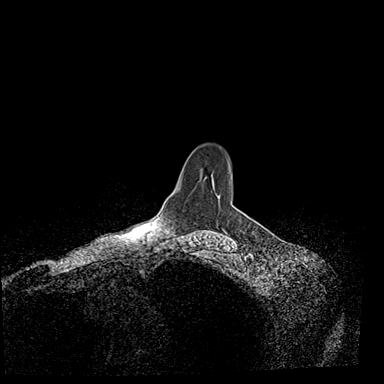
[im 144/144]
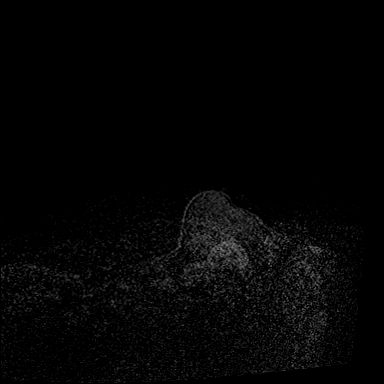

[Series 4: dynamic post 20 · axial · 1.3mm · 0.73mm/px · z∈[-119,+67]mm · 5 of 144 slices shown (1 of 2)]
[im 1/144]
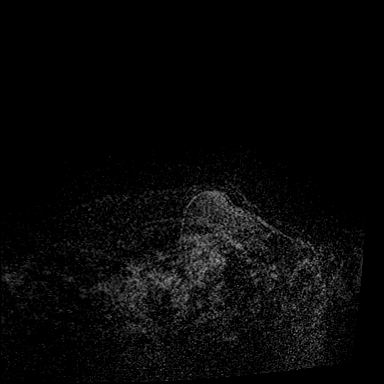
[im 36/144]
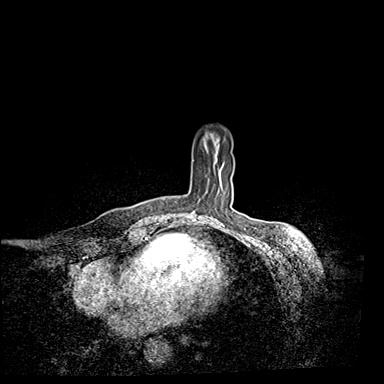
[im 72/144]
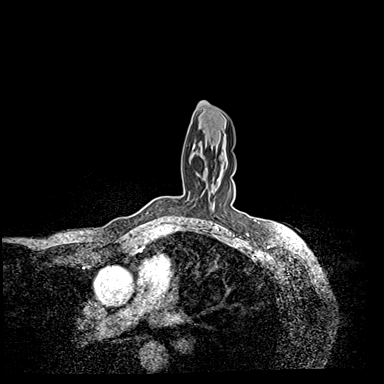
[im 108/144]
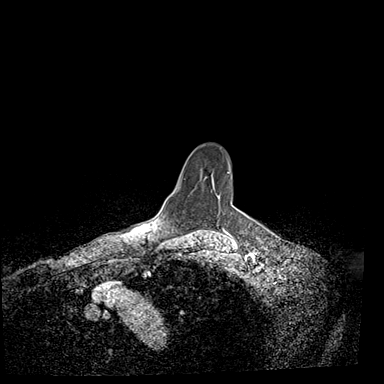
[im 144/144]
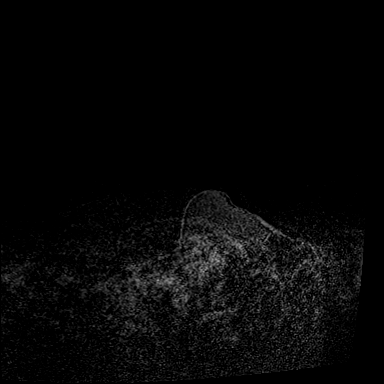

[Series 5: dynamic post 20 · axial · 1.3mm · 0.73mm/px · z∈[-119,+67]mm · 5 of 144 slices shown (2 of 2)]
[im 1/144]
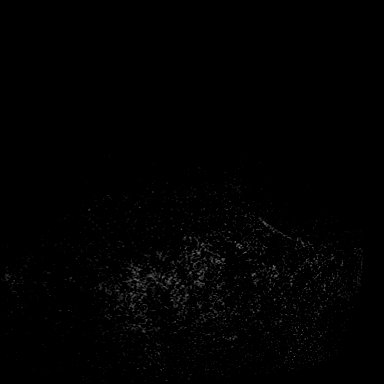
[im 36/144]
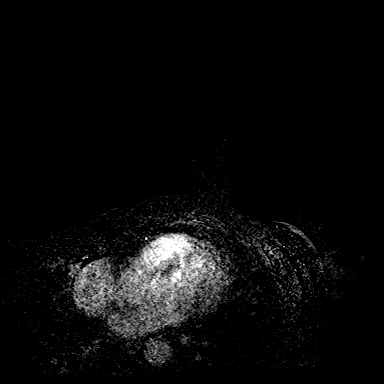
[im 72/144]
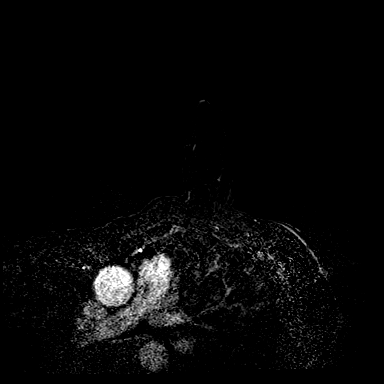
[im 108/144]
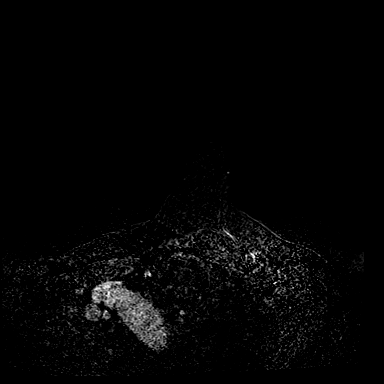
[im 144/144]
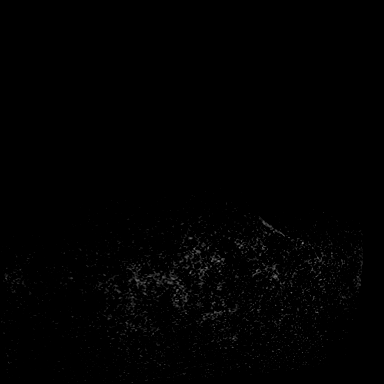

[Series 6: dynamic post 3 · axial · 1.3mm · 0.73mm/px · z∈[-119,+67]mm · 5 of 144 slices shown (1 of 2)]
[im 1/144]
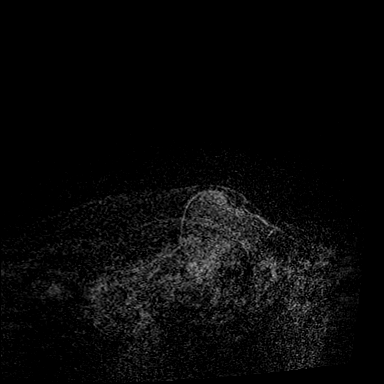
[im 36/144]
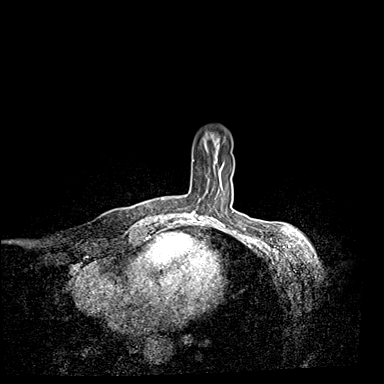
[im 72/144]
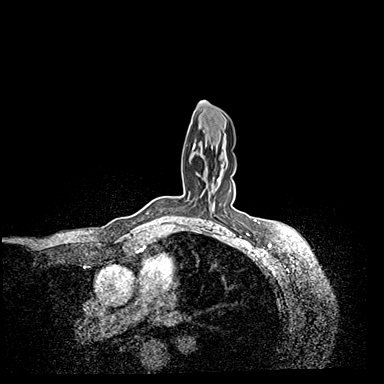
[im 108/144]
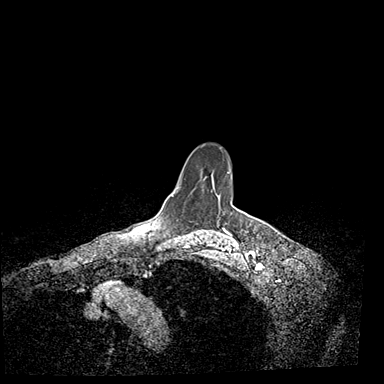
[im 144/144]
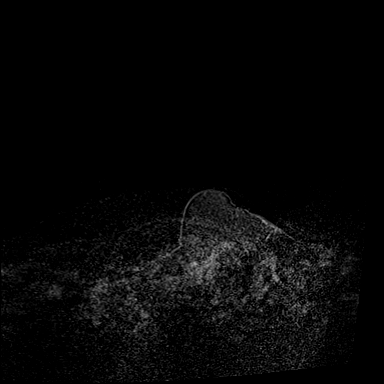

[Series 7: dynamic post 3 · axial · 1.3mm · 0.73mm/px · z∈[-119,+67]mm · 5 of 144 slices shown (2 of 2)]
[im 1/144]
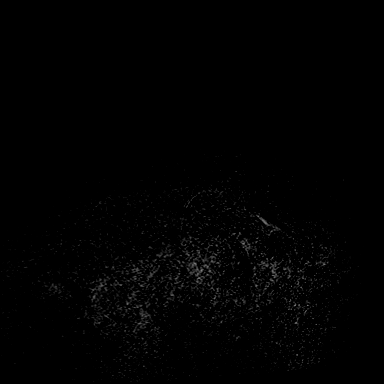
[im 36/144]
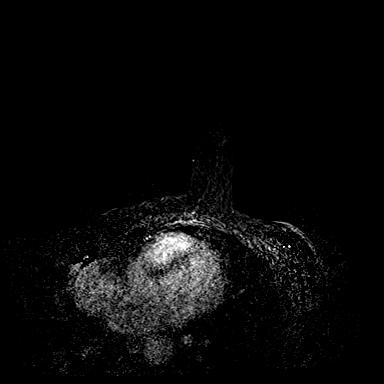
[im 72/144]
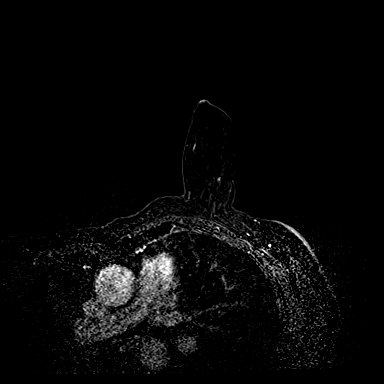
[im 108/144]
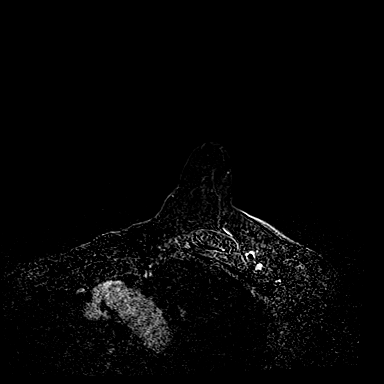
[im 144/144]
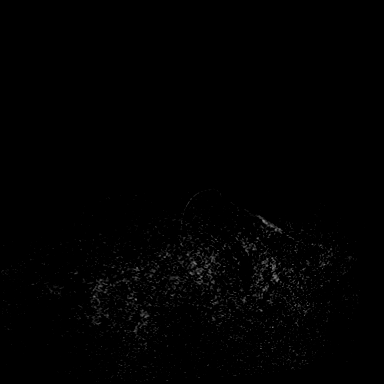

[Series 8: dynamic post 7min · axial · 1.3mm · 0.73mm/px · z∈[-119,+67]mm · 5 of 144 slices shown]
[im 1/144]
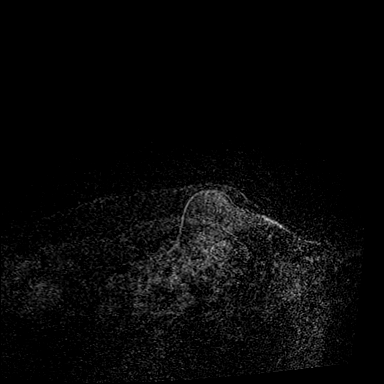
[im 36/144]
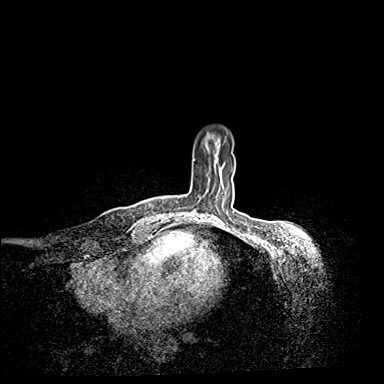
[im 72/144]
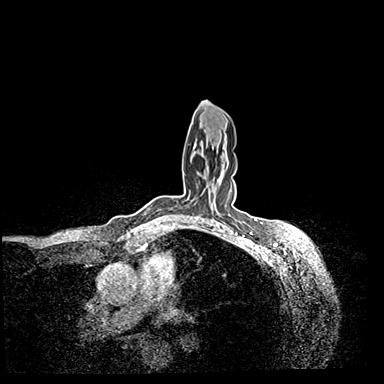
[im 108/144]
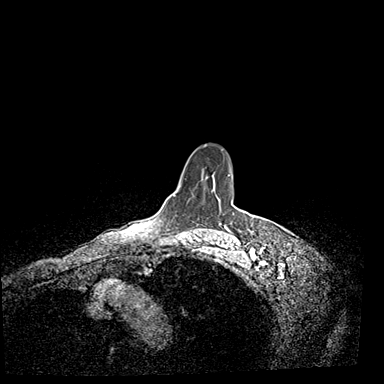
[im 144/144]
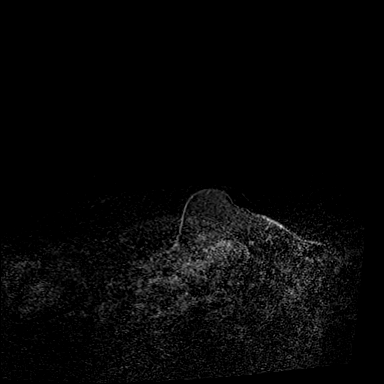

[33 of 48 positions shown; findings below may reference images not displayed]

FINDINGS: I met with the patient, and we discussed the procedure of MRI guided
biopsy, including risks, benefits, and alternatives. Specifically,
we discussed the risks of infection, bleeding, tissue injury, clip
migration, and inadequate sampling. Informed, written consent was
given. The usual time out protocol was performed immediately prior
to the procedure.

Using sterile technique, 1% Lidocaine, MRI guidance, and a 9 gauge
vacuum assisted device, biopsy was performed of the 1.1 cm enhancing
mass in the upper slightly outer aspect the left breast, middle
depth, using a lateral approach. At the conclusion of the procedure,
a dumbbell shaped tissue marker clip was deployed into the biopsy
cavity. Follow-up 2-view mammogram was performed and dictated
separately.
IMPRESSION: MRI guided biopsy of the left breast.  No apparent complications.

ADDENDUM:
Pathology revealed INTERMEDIATE GRADE DUCTAL CARCINOMA IN SITU WITH
CALCIFICATIONS, COLUMNAR CELL CHANGES WITH CALCIFICATIONS of the
LEFT breast, 12:30 o'clock, middle depth, (dumbbell clip). This was
found to be concordant by Dr. Nats Ageitos.

Pathology results were discussed with the patient by telephone. The
patient reported doing well after the biopsy with tenderness at the
site. Post biopsy instructions and care were reviewed and questions
were answered. The patient was encouraged to call The [[facility]] for any additional concerns. My direct phone
number was provided.

Surgical consultation has been arranged with Dr. Elvinass Lubyte
at [[facility]] on January 14, 2021.

Medical Oncology consultation has been arranged with Dr. Shalley
Sobranie at [facility]] on February 10, 2021.

Pathology results reported by Decler Veronique, RN on 01/08/2021.

*** End of Addendum ***
FINDINGS: I met with the patient, and we discussed the procedure of MRI guided
biopsy, including risks, benefits, and alternatives. Specifically,
we discussed the risks of infection, bleeding, tissue injury, clip
migration, and inadequate sampling. Informed, written consent was
given. The usual time out protocol was performed immediately prior
to the procedure.

Using sterile technique, 1% Lidocaine, MRI guidance, and a 9 gauge
vacuum assisted device, biopsy was performed of the 1.1 cm enhancing
mass in the upper slightly outer aspect the left breast, middle
depth, using a lateral approach. At the conclusion of the procedure,
a dumbbell shaped tissue marker clip was deployed into the biopsy
cavity. Follow-up 2-view mammogram was performed and dictated
separately.
IMPRESSION: MRI guided biopsy of the left breast.  No apparent complications.

## 2022-02-20 ENCOUNTER — Ambulatory Visit (HOSPITAL_COMMUNITY): Payer: Medicare Other | Attending: Cardiovascular Disease

## 2022-02-20 DIAGNOSIS — I471 Supraventricular tachycardia, unspecified: Secondary | ICD-10-CM | POA: Diagnosis not present

## 2022-02-20 DIAGNOSIS — R9431 Abnormal electrocardiogram [ECG] [EKG]: Secondary | ICD-10-CM | POA: Diagnosis not present

## 2022-02-20 DIAGNOSIS — R002 Palpitations: Secondary | ICD-10-CM | POA: Insufficient documentation

## 2022-02-20 LAB — ECHOCARDIOGRAM COMPLETE
Area-P 1/2: 3.66 cm2
S' Lateral: 2.5 cm

## 2022-02-25 ENCOUNTER — Telehealth: Payer: Self-pay

## 2022-02-25 NOTE — Telephone Encounter (Signed)
Spoke with pt. Pt was notified of echo results. Pt wanted to know if she should give the Metoprolol 50 mg a littler more time to work? Pt states that she has hundreds of PVCs daily. Pt's heart rate is around 50s or 60 s? Pt also should the Metoprolol, take away the PVCs?

## 2022-02-25 NOTE — Telephone Encounter (Signed)
Spoke with pt. Pt is aware to give the increased dose of Metoprolol 2 weeks to start working and pt is aware that it can reduce the frequency of PVCs.

## 2022-03-09 NOTE — Progress Notes (Signed)
Office Visit Note  Patient: Michelle Gray             Date of Birth: 1955-08-17           MRN: [id number]             PCP: Cari Caraway, MD Referring: Cari Caraway, MD Visit Date: 03/23/2022 Occupation: @GUAROCC$ @  Subjective:  Lower back pain and bilateral trochanteric bursa pain.  History of Present Illness: Michelle Gray is a 67 y.o. female with history of osteoarthritis, degenerative disc disease and fibromyalgia syndrome.  She states that on December she hit her right knee against a door and developed a laceration.  She went to see [facility] her x-rays were normal.  She states the wound is gradually healing she is using a DuoDERM patch.  She also developed a viral infection in December and after that she developed palpitations.  She was diagnosed with PVCs and SVTs.  She states she was given metoprolol for palpitations which is not helping much.  She will have a follow-up appointment with the cardiologist.  She continues to have discomfort in her lower back and bilateral trochanteric region.  She tried physical therapy which made her symptoms worse.  She has been doing stretching exercises at home.  She states she will be retiring next month and she will have more time to stretch and exercise.  She continues to have some stiffness in her hands due to underlying osteoarthritis.  Her hands also stay cold but she has not noticed any discoloration in her hands.  She continues to have dry mouth and dry eye symptoms for which she has been using over-the-counter products.  She continues to be on meloxicam 15 mg p.o. daily, methocarbamol 750 mg p.o. nightly as needed and Ambien 10 mg p.o. nightly.    Activities of Daily Living:  Patient reports morning stiffness for 5 minutes.   Patient Reports nocturnal pain.  Difficulty dressing/grooming: Denies Difficulty climbing stairs: Denies Difficulty getting out of chair: Denies Difficulty using hands for taps, buttons, cutlery, and/or  writing: Denies  Review of Systems  Constitutional:  Positive for fatigue.  HENT:  Positive for mouth dryness. Negative for mouth sores.   Eyes:  Positive for dryness.  Respiratory:  Negative for shortness of breath.   Cardiovascular:  Positive for palpitations. Negative for chest pain.       On Metoprolol  Gastrointestinal:  Negative for blood in stool, constipation and diarrhea.  Endocrine: Negative for increased urination.  Genitourinary:  Negative for difficulty urinating.  Musculoskeletal:  Positive for joint pain, joint pain, myalgias, morning stiffness and myalgias. Negative for joint swelling.  Skin:  Negative for color change, rash and sensitivity to sunlight.  Allergic/Immunologic: Negative for susceptible to infections.  Neurological:  Negative for dizziness and light-headedness.  Hematological:  Negative for swollen glands.  Psychiatric/Behavioral:  Positive for sleep disturbance. Negative for depressed mood. The patient is not nervous/anxious.     PMFS History:  Patient Active Problem List   Diagnosis Date Noted   Breast cancer, left breast ([place]) 02/11/2021   Cough variant asthma with ? component upper airway cough syndrome 12/23/2018   Primary osteoarthritis of both hands 02/21/2016   Fibromyalgia 02/21/2016   History of hypothyroidism 02/21/2016   Dyslipidemia 02/21/2016   History of breast cancer 02/21/2016   Age-related osteoporosis without current pathological fracture 02/21/2016   Unspecified hypothyroidism 08/06/2013   Other and unspecified hyperlipidemia 08/06/2013   Insomnia 08/06/2013   Intrinsic asthma 08/06/2013  Hip pain, left 01/12/2011   Plantar fasciitis 01/12/2011    Past Medical History:  Diagnosis Date   Arthritis    Asthma    Cancer ([place]) 1990   right breast ca-mast with reconstr   Cancer [facility]) 12/2020   left breast DCIS   Eczema    dx by derm per patient   Fibromyalgia    Hyperlipidemia    Hypothyroidism    Neuromuscular disorder  ([place])    Osteoporosis    PONV (postoperative nausea and vomiting)    PVC (premature ventricular contraction)    Thyroid disease     Family History  Problem Relation Age of Onset   Cancer Mother    Hyperlipidemia Mother    Hypertension Mother    Stroke Mother    Dementia Mother    Hyperlipidemia Father    Stroke Father    Prostate cancer Father    Cancer Father 57       Prostate   Hypertension Brother    Past Surgical History:  Procedure Laterality Date   APPENDECTOMY     BREAST IMPLANT REMOVAL Right 02/11/2021   Procedure: REMOVAL RIGHT BREAST IMPLANT;  Surgeon: Irene Limbo, MD;  Location: [place];  Service: Plastics;  Laterality: Right;   BREAST SURGERY Right 1990   Mastectomy   CAPSULECTOMY Right 02/11/2021   Procedure: CAPSULECTOMY;  Surgeon: Irene Limbo, MD;  Location: [place];  Service: Plastics;  Laterality: Right;   FOOT SURGERY     LAPROSCOPIC     TOTAL MASTECTOMY Left 02/11/2021   Procedure: LEFT TOTAL MASTECTOMY;  Surgeon: Rolm Bookbinder, MD;  Location: [place];  Service: General;  Laterality: Left;   [place] History   Social History Narrative   Not on file   Immunization History  Administered Date(s) Administered   Influenza-Unspecified 10/04/2018   PFIZER(Purple Top)SARS-COV-2 Vaccination 01/31/2019, 02/21/2019, 11/04/2019, 05/18/2020     Objective: Vital Signs: BP 123/72 (BP Location: Left Arm, Patient Position: Sitting, Cuff Size: Normal)   Pulse 66   Resp 12   Ht 5' 7.25" (1.708 m)   Wt 161 lb (73 kg)   BMI 25.03 kg/m    Physical Exam Vitals and nursing note reviewed.  Constitutional:      Appearance: She is well-developed.  HENT:     Head: Normocephalic and atraumatic.  Eyes:     Conjunctiva/sclera: Conjunctivae normal.  Cardiovascular:     Rate and Rhythm: Normal rate and regular rhythm.     Heart sounds: Normal heart sounds.  Pulmonary:      Effort: Pulmonary effort is normal.     Breath sounds: Normal breath sounds.  Abdominal:     General: Bowel sounds are normal.     Palpations: Abdomen is soft.  Musculoskeletal:     Cervical back: Normal range of motion.  Lymphadenopathy:     Cervical: No cervical adenopathy.  Skin:    General: Skin is warm and dry.     Capillary Refill: Capillary refill takes less than 2 seconds.  Neurological:     Mental Status: She is alert and oriented to person, place, and time.  Psychiatric:        Behavior: Behavior normal.      Musculoskeletal Exam: Cervical spine was in good range of motion.  Lumbar spine was in good range of motion with some discomfort.  Shoulders, elbow joints, wrist joints, MCPs PIPs and DIPs with good range of motion.  She had bilateral DIP thickening.  Hip joints were in good range of motion with bilateral tenderness over trochanteric region.  Knee joints were in good range of motion.  She was no tenderness over ankles or MTPs.  She had no Achilles tendinitis of [place] fasciitis.  CDAI Exam: CDAI Score: -- Patient Global: --; Provider Global: -- Swollen: --; Tender: -- Joint Exam 03/23/2022   No joint exam has been documented for this visit   There is currently no information documented on the homunculus. Go to the Rheumatology activity and complete the homunculus joint exam.  Investigation: No additional findings.  Imaging: No results found.  Recent Labs: Lab Results  Component Value Date   WBC 16.8 (H) 08/06/2013   HGB 13.4 08/06/2013   PLT 391 08/06/2013       Speciality Comments: No specialty comments available.  Procedures:  No procedures performed Allergies: Compazine and Tape   Assessment / Plan:     Visit Diagnoses: Primary osteoarthritis of both hands-she had bilateral DIP thickening.  She continues to have some stiffness in her hands.  Joint protection was discussed.  Trochanteric bursitis of left hip-she had tenderness over bilateral  trochanteric bursa more prominent on the left side.  IT band stretches were emphasized.  Plantar fasciitis-she has intermittent symptoms which are improved with stretching exercises.  DDD (degenerative disc disease), lumbar-she continues to have lower back pain.  She has been taking meloxicam 15 mg p.o. daily and methocarbamol 750 mg p.o. nightly as needed.  She tried physical therapy which made her symptoms worse.  She been doing stretching exercises at home now.March 17, 2022 CBC WBC 5.5 hemoglobin 13.8 platelets 307, CMP creatinine 0.93, AST 26, ALT 20, LDL 61 ,TSH normal  Osteopenia of multiple sites - DEXA 12/01/19 T-score: -2.0, BMD: 0.622. She had GI intolerance to bisphosphonates in the past.January 12, 2022 DEXA scan T-score -2.3, BMD 0.595 left femoral neck (no comparison) right total femur 7% increase left total femur no change, total spine 8% increase.  She will continue calcium and vitamin D and resistive exercises for now.  Raynaud's disease without gangrene-her symptoms are mild.  She has not noticed any discoloration.  She states her hands are stable.  Other fatigue-related to fibromyalgia and insomnia.  Primary insomnia - Ambien 10 mg p.o. nightly.  She is unable to sleep without Ambien.  Fibromyalgia -she continues to have some generalized pain and discomfort.  She also has myalgias.  She is on methocarbamol 750 mg p.o. nightly as needed  History of breast cancer - Ductal carcinoma in situ.ER/PR+, Status post left mastectomy February 11, 2021.CTX 1990, No RTX.  History of hyperlipidemia-well-controlled on the combination of Crestor and Zetia.  History of hypothyroidism-TSH was no  Orders: No orders of the defined types were placed in this encounter.  No orders of the defined types were placed in this encounter.   Follow-Up Instructions: Return in about 6 months (around 09/21/2022) for Osteoarthritis, fibromyalgia.   Bo Merino, MD  Note - This record has  been created using Editor, commissioning.  Chart creation errors have been sought, but may not always  have been located. Such creation errors do not reflect on  the standard of medical care.

## 2022-03-14 ENCOUNTER — Other Ambulatory Visit: Payer: Self-pay | Admitting: Nurse Practitioner

## 2022-03-17 DIAGNOSIS — Z79899 Other long term (current) drug therapy: Secondary | ICD-10-CM | POA: Diagnosis not present

## 2022-03-17 DIAGNOSIS — E039 Hypothyroidism, unspecified: Secondary | ICD-10-CM | POA: Diagnosis not present

## 2022-03-17 DIAGNOSIS — E7800 Pure hypercholesterolemia, unspecified: Secondary | ICD-10-CM | POA: Diagnosis not present

## 2022-03-23 ENCOUNTER — Ambulatory Visit: Payer: 59 | Attending: Rheumatology | Admitting: Rheumatology

## 2022-03-23 ENCOUNTER — Encounter: Payer: Self-pay | Admitting: Rheumatology

## 2022-03-23 VITALS — BP 123/72 | HR 66 | Resp 12 | Ht 67.25 in | Wt 161.0 lb

## 2022-03-23 DIAGNOSIS — Z8639 Personal history of other endocrine, nutritional and metabolic disease: Secondary | ICD-10-CM

## 2022-03-23 DIAGNOSIS — M797 Fibromyalgia: Secondary | ICD-10-CM | POA: Diagnosis not present

## 2022-03-23 DIAGNOSIS — M722 Plantar fascial fibromatosis: Secondary | ICD-10-CM

## 2022-03-23 DIAGNOSIS — M8589 Other specified disorders of bone density and structure, multiple sites: Secondary | ICD-10-CM

## 2022-03-23 DIAGNOSIS — F5101 Primary insomnia: Secondary | ICD-10-CM

## 2022-03-23 DIAGNOSIS — M51369 Other intervertebral disc degeneration, lumbar region without mention of lumbar back pain or lower extremity pain: Secondary | ICD-10-CM

## 2022-03-23 DIAGNOSIS — I7300 Raynaud's syndrome without gangrene: Secondary | ICD-10-CM

## 2022-03-23 DIAGNOSIS — R5383 Other fatigue: Secondary | ICD-10-CM

## 2022-03-23 DIAGNOSIS — M5136 Other intervertebral disc degeneration, lumbar region: Secondary | ICD-10-CM | POA: Diagnosis not present

## 2022-03-23 DIAGNOSIS — M19042 Primary osteoarthritis, left hand: Secondary | ICD-10-CM | POA: Diagnosis not present

## 2022-03-23 DIAGNOSIS — M19041 Primary osteoarthritis, right hand: Secondary | ICD-10-CM | POA: Diagnosis not present

## 2022-03-23 DIAGNOSIS — Z853 Personal history of malignant neoplasm of breast: Secondary | ICD-10-CM | POA: Diagnosis not present

## 2022-03-23 DIAGNOSIS — M7062 Trochanteric bursitis, left hip: Secondary | ICD-10-CM

## 2022-04-01 ENCOUNTER — Other Ambulatory Visit: Payer: Self-pay | Admitting: Rheumatology

## 2022-04-01 NOTE — Telephone Encounter (Signed)
Next Visit: 09/22/2022  Last Visit: 03/23/2022  Last Fill: 12/18/2021  DX: DDD (degenerative disc disease), lumbar   Current Dose per office note 03/23/2022: meloxicam 15 mg p.o. daily   Labs: 03/18/2022 MCV 98  Okay to refill Meloxicam?

## 2022-04-05 NOTE — Progress Notes (Unsigned)
Cardiology Office Note:    Date:  04/06/2022   ID:  Michelle Gray, DOB December 02, 1955, MRN [id number]  PCP:  Cari Caraway, MD  Cardiologist:  Donato Heinz, MD  Electrophysiologist:  None   Referring MD: Cari Caraway, MD   Chief Complaint  Patient presents with   Palpitations    History of Present Illness:    Michelle Gray is a 67 y.o. female with a hx of fibromyalgia, asthma, hyperlipidemia, breast cancer, hypothyroidism who returns for follow-up.  She was initially seen on 12/08/2018, she was referred by Dr. Addison Lank for evaluation of palpitations and chest pain.    TTE was done on 12/20/2018, which showed normal LV systolic function, normal RV function, no significant valvular disease.  Cardiac monitor showed no significant arrhythmias, with the patient triggered events corresponding to sinus rhythm plus or minus PACs.  Coronary CTA was done on 01/14/2019, which showed nonobstructive CAD with calcified plaque in the proximal LAD causing minimal (0-24%) stenosis and noncalcified plaque in the proximal RCA causing minimal (0-24%) stenosis.  Calcium score was 32 (73rd percentile for age/gender).  Zio patch x 7 days 01/2022 showed 2 episodes of NSVT, longest lasting 5 beats and 45 episodes of SVT with longest lasting 18.5 seconds and occasional PVCs (1.7% of beats).  Echocardiogram 02/20/2022 showed EF 55 to 60%, normal RV function, no significant valvular disease.  Since last clinic visit, she reports that she continues to have palpitations, feels like heart is skipping beats.  She drinks about 1 cup of coffee per day.  She increased her Toprol-XL to 62.5 mg daily.  Does not feel like it is helping.  She denies any chest pain or dyspnea.  Reports she retired last week.    Past Medical History:  Diagnosis Date   Arthritis    Asthma    Cancer [facility]) 1990   right breast ca-mast with reconstr   Cancer [facility]) 12/2020   left breast DCIS   Eczema    dx by derm per patient    Fibromyalgia    Hyperlipidemia    Hypothyroidism    Neuromuscular disorder ([place])    Osteoporosis    PONV (postoperative nausea and vomiting)    PVC (premature ventricular contraction)    Thyroid disease     Past Surgical History:  Procedure Laterality Date   APPENDECTOMY     BREAST IMPLANT REMOVAL Right 02/11/2021   Procedure: REMOVAL RIGHT BREAST IMPLANT;  Surgeon: Irene Limbo, MD;  Location: [place];  Service: Plastics;  Laterality: Right;   BREAST SURGERY Right 1990   Mastectomy   CAPSULECTOMY Right 02/11/2021   Procedure: CAPSULECTOMY;  Surgeon: Irene Limbo, MD;  Location: [place];  Service: Plastics;  Laterality: Right;   FOOT SURGERY     LAPROSCOPIC     TOTAL MASTECTOMY Left 02/11/2021   Procedure: LEFT TOTAL MASTECTOMY;  Surgeon: Rolm Bookbinder, MD;  Location: [place];  Service: General;  Laterality: Left;   WISDOM TEETH REMOVAL      Current Medications: Current Meds  Medication Sig   5-HTP CAPS Take 2 tablets by mouth at bedtime.   albuterol (PROVENTIL HFA;VENTOLIN HFA) 108 (90 BASE) MCG/ACT inhaler Inhale 2 puffs into the lungs every 6 (six) hours as needed for wheezing or shortness of breath.   Biotin 5000 MCG CAPS Take 5,000 mcg by mouth daily.   budesonide-formoterol (SYMBICORT) 160-4.5 MCG/ACT inhaler Inhale 2 puffs into the lungs in the morning and at bedtime.  Cholecalciferol (VITAMIN D3) 5000 units CAPS Take 7,000 Units by mouth once a week.   Coenzyme Q10 (COQ-10) 100 MG CAPS    diltiazem (CARDIZEM CD) 120 MG 24 hr capsule Take 1 capsule (120 mg total) by mouth daily.   estradiol (ESTRACE) 0.1 MG/GM vaginal cream Insert 1 g twice a week by vaginal route as directed for 30 days.   gabapentin (NEURONTIN) 300 MG capsule TAKE TWO CAPSULES BY MOUTH AT BEDTIME (Patient taking differently: Take 300 mg by mouth at bedtime.)   Glucosamine 500 MG TABS Take 1,500 mg by mouth daily.   levothyroxine  (SYNTHROID, LEVOTHROID) 50 MCG tablet 50 mcg 6 days weekly.   levothyroxine (SYNTHROID, LEVOTHROID) 75 MCG tablet Take 75 mcg by mouth once a week.   Magnesium 500 MG CAPS Take 500 mg by mouth daily.   meloxicam (MOBIC) 15 MG tablet TAKE 1 TABLET BY MOUTH DAILY   methocarbamol (ROBAXIN) 750 MG tablet Take 1 tablet (750 mg total) by mouth at bedtime as needed for muscle spasms.   Misc Natural Products (TART CHERRY ADVANCED PO) Take by mouth.   mometasone (ELOCON) 0.1 % cream SMARTSIG:1 Topical Daily   rosuvastatin (CRESTOR) 20 MG tablet Take 1 tablet (20 mg total) by mouth daily.   zolpidem (AMBIEN) 10 MG tablet TAKE 1 TABLET BY MOUTH AT BEDTIME   [DISCONTINUED] metoprolol succinate (TOPROL-XL) 50 MG 24 hr tablet TAKE 1 TABLET BY MOUTH DAILY WITH OR IMMEDIATELY FOLLOWING A MEAL     Allergies:   Compazine and Tape   Social History   Socioeconomic History   Marital status: Married    Spouse name: Not on file   Number of children: Not on file   Years of education: Not on file   Highest education level: Not on file  Occupational History   Not on file  Tobacco Use   Smoking status: Never    Passive exposure: Past   Smokeless tobacco: Never  Vaping Use   Vaping Use: Never used  Substance and Sexual Activity   Alcohol use: Yes    Comment: social   Drug use: Never   Sexual activity: Not on file  Other Topics Concern   Not on file  Social History Narrative   Not on file   Social Determinants of Health   Financial Resource Strain: Not on file  Food Insecurity: Not on file  Transportation Needs: Not on file  Physical Activity: Not on file  Stress: Not on file  Social Connections: Not on file     Family History: The patient's family history includes Cancer in her mother; Cancer (age of onset: 76) in her father; Dementia in her mother; Hyperlipidemia in her father and mother; Hypertension in her brother and mother; Prostate cancer in her father; Stroke in her father and  mother.  ROS:   Please see the history of present illness.    All other systems reviewed and are negative.  EKGs/Labs/Other Studies Reviewed:    The following studies were reviewed today:   EKG:   04/09/21: NSR with first degree AV block, LAD, Q waves in V1-3 (old) 04/06/22: NSR, rate 69, first degree AV block, iRBBB, Q waves in V1-3 (old), LAD   Recent Labs: No results found for requested labs within last 365 days.  Recent Lipid Panel No results found for: "CHOL", "TRIG", "HDL", "CHOLHDL", "VLDL", "LDLCALC", "LDLDIRECT"   TTE 12/20/18:  1. Left ventricular ejection fraction, by visual estimation, is 60 to  65%. The left ventricle has  normal function. There is no left ventricular  hypertrophy. Normal diastolic function.   2. Global right ventricle has normal systolic function.The right  ventricular size is normal. No increase in right ventricular wall  thickness.   3. Left atrial size was normal.   4. Right atrial size was normal.   5. Moderate thickening of the mitral valve leaflet(s).   6. The mitral valve is normal in structure. Trace mitral valve  regurgitation.   7. The tricuspid valve is normal in structure. Tricuspid valve  regurgitation is trivial.   8. The aortic valve was not well visualized. Aortic valve regurgitation  is not visualized. No evidence of aortic valve sclerosis or stenosis.   9. The pulmonic valve was not well visualized. Pulmonic valve  regurgitation is not visualized.  10. Mildly elevated pulmonary artery systolic pressure.  11. The inferior vena cava is normal in size with greater than 50%  respiratory variability, suggesting right atrial pressure of 3 mmHg.  12. The tricuspid regurgitant velocity is 2.31 m/s, and with an assumed  right atrial pressure of 10 mmHg, the estimated right ventricular systolic  pressure is mildly elevated at 31.3 mmHg.   CTA 01/14/19: 1. Coronary calcium score of 32. This was 33 percentile for age and sex matched  control. 2.  Normal coronary origin with right dominance. 3. Nonobstructive CAD, with calcified plaque in the proximal LAD causing minimal (0-24%) stenosis and noncalcified plaque in the proximal RCA causing minimal (0-24%) stenosis   CAD-RADS 1. Minimal non-obstructive CAD (0-24%). Consider non-atherosclerotic causes of chest pain. Consider preventive therapy and risk factor modification.  Noncardiac: IMPRESSION: 1.  Aortic Atherosclerosis (ICD10-I70.0).  Cardiac monitor 02/16/19: No significant arrhythmias Patient triggered events corresponded to sinus rhythm +/- PACs   Predominant rhythm is sinus rhythm. Range is 58-142 bpm with average of 78 bpm. No atrial fibrillation, sustained ventricular tachycardia, significant pause, or high degree AV block. Total ectopy <1%. 20 patient triggered events, corresponding to sinus rhythm  +/- PACs  No significant abnormalities.  Physical Exam:    VS:  BP 114/72   Pulse 67   Ht 5' 7.5" (1.715 m)   Wt 165 lb 6.4 oz (75 kg)   SpO2 96%   BMI 25.52 kg/m     Wt Readings from Last 3 Encounters:  04/06/22 165 lb 6.4 oz (75 kg)  03/23/22 161 lb (73 kg)  01/05/22 162 lb 9.6 oz (73.8 kg)     GEN:  Well nourished, well developed in no acute distress HEENT: Normal NECK: No JVD; No carotid bruits CARDIAC: RRR, no murmurs, rubs, gallops RESPIRATORY:  Expiratory wheezing ABDOMEN: Soft, non-tender, non-distended MUSCULOSKELETAL:  No edema; No deformity  SKIN: Warm and dry NEUROLOGIC:  Alert and oriented x 3 PSYCHIATRIC:  Normal affect   ASSESSMENT:    1. PSVT (paroxysmal supraventricular tachycardia)   2. Coronary artery disease involving native coronary artery of native heart without angina pectoris   3. Hyperlipidemia LDL goal <70      PLAN:     Coronary artery disease: Coronary CTA on 01/14/2019  showed nonobstructive CAD with calcified plaque in the proximal LAD causing minimal (0-24%) stenosis and noncalcified plaque in the proximal  RCA causing minimal (0-24%) stenosis.  Calcium score was 32 (73rd percentile for age/gender) -Rosuvastatin increased to 40 mg daily, developed myalgias, so was decreased to 20 mg daily and Zetia 10 mg daily was added.  LDL 61 on 03/17/22. Continue rosuvastatin and Zetia.  Palpitations: Zio patch x 7 days  01/2022 showed 2 episodes of NSVT, longest lasting 5 beats and 45 episodes of SVT with longest lasting 18.5 seconds and occasional PVCs (1.7% of beats).  Echocardiogram 02/20/2022 showed EF 55 to 60%, normal RV function, no significant valvular disease.  Normal electrolytes and TSH on labs 03/2022. -She has been on Toprol for her palpitations, with description concerning for symptomatic PVCs.  Reports metoprolol has not helping with her symptoms.  Recommend weaning off metoprolol and trying diltiazem instead.  Will start with diltiazem 120 mg daily  Hyperlipidemia: Continue rosuvastatin 20 mg daily and Zetia 10 mg daily,  LDL 61 on 2/13/2  RTC in 3 months  Medication Adjustments/Labs and Tests Ordered: Current medicines are reviewed at length with the patient today.  Concerns regarding medicines are outlined above.  Orders Placed This Encounter  Procedures   EKG 12-Lead   Meds ordered this encounter  Medications   metoprolol succinate (TOPROL-XL) 50 MG 24 hr tablet    Sig: Take 50 mg daily x1 week, then 25 mg daily x1 week, then STOP   diltiazem (CARDIZEM CD) 120 MG 24 hr capsule    Sig: Take 1 capsule (120 mg total) by mouth daily.    Dispense:  90 capsule    Refill:  3    Weaning off metoprolol    Patient Instructions  Medication Instructions:  Decrease metoprolol to 50 mg daily x 1 week, then 25 mg daily x 1 week, then STOP  Once off metoprolol, START diltiazem 120 mg daily   *If you need a refill on your cardiac medications before your next appointment, please call your pharmacy*  Follow-Up: At [facility], you and your health needs are our priority.  As part of our  continuing mission to provide you with exceptional heart care, we have created designated Provider Care Teams.  These Care Teams include your primary Cardiologist (physician) and Advanced Practice Providers (APPs -  Physician Assistants and Nurse Practitioners) who all work together to provide you with the care you need, when you need it.  We recommend signing up for the patient portal called "MyChart".  Sign up information is provided on this After Visit Summary.  MyChart is used to connect with patients for Virtual Visits (Telemedicine).  Patients are able to view lab/test results, encounter notes, upcoming appointments, etc.  Non-urgent messages can be sent to your provider as well.   To learn more about what you can do with MyChart, go to [email or web address].    Your next appointment:   3 month(s)  Provider:   Donato Heinz, MD     Other Instructions You can look into the [facility] device by [employer]. This device is purchased by you and it connects to an application you download to your smart phone.  It can detect abnormal heart rhythms and alert you to contact your doctor for further evaluation. The web site is:  [email or web address]      Signed, Donato Heinz, MD  04/06/2022 10:00 AM    [place]

## 2022-04-06 ENCOUNTER — Ambulatory Visit: Payer: Medicare Other | Attending: Cardiology | Admitting: Cardiology

## 2022-04-06 ENCOUNTER — Encounter: Payer: Self-pay | Admitting: Cardiology

## 2022-04-06 VITALS — BP 114/72 | HR 67 | Ht 67.5 in | Wt 165.4 lb

## 2022-04-06 DIAGNOSIS — E785 Hyperlipidemia, unspecified: Secondary | ICD-10-CM | POA: Insufficient documentation

## 2022-04-06 DIAGNOSIS — I471 Supraventricular tachycardia, unspecified: Secondary | ICD-10-CM | POA: Diagnosis not present

## 2022-04-06 DIAGNOSIS — I2510 Atherosclerotic heart disease of native coronary artery without angina pectoris: Secondary | ICD-10-CM | POA: Diagnosis not present

## 2022-04-06 MED ORDER — METOPROLOL SUCCINATE ER 50 MG PO TB24: ORAL_TABLET | ORAL | Status: DC

## 2022-04-06 MED ORDER — DILTIAZEM HCL ER COATED BEADS 120 MG PO CP24: 120.0000 mg | ORAL_CAPSULE | Freq: Every day | ORAL | 3 refills | Status: DC

## 2022-04-06 NOTE — Patient Instructions (Signed)
Medication Instructions:  Decrease metoprolol to 50 mg daily x 1 week, then 25 mg daily x 1 week, then STOP  Once off metoprolol, START diltiazem 120 mg daily   *If you need a refill on your cardiac medications before your next appointment, please call your pharmacy*  Follow-Up: At [facility], you and your health needs are our priority.  As part of our continuing mission to provide you with exceptional heart care, we have created designated Provider Care Teams.  These Care Teams include your primary Cardiologist (physician) and Advanced Practice Providers (APPs -  Physician Assistants and Nurse Practitioners) who all work together to provide you with the care you need, when you need it.  We recommend signing up for the patient portal called "MyChart".  Sign up information is provided on this After Visit Summary.  MyChart is used to connect with patients for Virtual Visits (Telemedicine).  Patients are able to view lab/test results, encounter notes, upcoming appointments, etc.  Non-urgent messages can be sent to your provider as well.   To learn more about what you can do with MyChart, go to [email or web address].    Your next appointment:   3 month(s)  Provider:   Donato Heinz, MD     Other Instructions You can look into the [facility] device by [employer]. This device is purchased by you and it connects to an application you download to your smart phone.  It can detect abnormal heart rhythms and alert you to contact your doctor for further evaluation. The web site is:  [email or web address]

## 2022-04-09 ENCOUNTER — Encounter: Payer: Self-pay | Admitting: Cardiology

## 2022-04-09 ENCOUNTER — Telehealth: Payer: Self-pay | Admitting: Cardiology

## 2022-04-09 NOTE — Telephone Encounter (Signed)
Called patient, advised she could do a screen shot of the EKG strips she would like for him to review, and send it via mychart as an attachment. She was on the phone with me and found this helpful, she will do this and will send it over.  Thanks!

## 2022-04-09 NOTE — Telephone Encounter (Signed)
  Pt is calling, pt got the EKG app, she said, she tried everything to download it but it won't let her until she pay for the monthly service. She requesting if she can come in to show it to Dr. Gardiner Rhyme instead or schedule a nurse visit

## 2022-04-21 ENCOUNTER — Telehealth: Payer: Self-pay | Admitting: Cardiology

## 2022-04-21 NOTE — Telephone Encounter (Signed)
Patient states she started the diltiazem Sunday and today is her second dose.  This is after the directions of metoprolol 50mg  X1 week, then 25mg  X1 week.  Metoprolol was completed Saturday.  She states today she had nausea and diarrhea just like when she took the metoprolol the "first"time(this was NOT the extended release formula).  She states once she took Metoprolol ER there was no Nausea or diarrhea.  She does not know what else would cause these symptoms to restart other than start of diltiazem.  She did start prednisone taper dose today but has had no issues in the past.  No other symptoms.  She only had cheese toast.  She took Zofran and drank a coke to get rid of the nausea.  She states that none of the arrhythmias have stopped, nor worsened.  The only difference with the Diltiazem is that heart rate feels more "bounding".  She states her monitor off [facility] said possible Afib but she does not feel like this is correct.    She ask if she can go back to the metoprolol ER as she did the last few weeks.  She is going out of town in the morning for a week and does not want to feel like this.  She ask could she take the metoprolol ER 25mg  until her return and then re-start the Diltiazem?  She has enough of the metoprolol ER 50mg  tablets that she can cut if this would be acceptable.

## 2022-04-21 NOTE — Telephone Encounter (Signed)
Spoke with patient.  OK to switch to toprol XL for her trip.  She would like to try rechallenge with diltiazem when she returns, which I think is reasonable.  If she has recurrence of symptoms, then will need to avoid diltiazem

## 2022-04-21 NOTE — Telephone Encounter (Signed)
Patient called and wanted to know if she can start back taking her beta-blocker instead of calcium blocker....please call back

## 2022-04-22 ENCOUNTER — Encounter (HOSPITAL_BASED_OUTPATIENT_CLINIC_OR_DEPARTMENT_OTHER): Payer: Worker's Compensation | Attending: General Surgery | Admitting: General Surgery

## 2022-04-22 DIAGNOSIS — Z9013 Acquired absence of bilateral breasts and nipples: Secondary | ICD-10-CM | POA: Insufficient documentation

## 2022-04-22 DIAGNOSIS — W19XXXS Unspecified fall, sequela: Secondary | ICD-10-CM | POA: Diagnosis not present

## 2022-04-22 DIAGNOSIS — M19041 Primary osteoarthritis, right hand: Secondary | ICD-10-CM | POA: Insufficient documentation

## 2022-04-22 DIAGNOSIS — E039 Hypothyroidism, unspecified: Secondary | ICD-10-CM | POA: Insufficient documentation

## 2022-04-22 DIAGNOSIS — Y990 Civilian activity done for income or pay: Secondary | ICD-10-CM | POA: Diagnosis not present

## 2022-04-22 DIAGNOSIS — M19042 Primary osteoarthritis, left hand: Secondary | ICD-10-CM | POA: Insufficient documentation

## 2022-04-22 DIAGNOSIS — Z853 Personal history of malignant neoplasm of breast: Secondary | ICD-10-CM | POA: Diagnosis not present

## 2022-04-22 DIAGNOSIS — S81011S Laceration without foreign body, right knee, sequela: Secondary | ICD-10-CM | POA: Insufficient documentation

## 2022-04-22 DIAGNOSIS — J45909 Unspecified asthma, uncomplicated: Secondary | ICD-10-CM | POA: Diagnosis not present

## 2022-04-22 NOTE — Progress Notes (Addendum)
NEISA, FUGATE ([id number]) [id number] Page 1 of 7 Visit Report for 04/22/2022 Allergy List Details Patient Name: Date of Service: Michelle Gray 04/22/2022 8:00 A M Medical Record Number: [id number] Patient Account Number: [id number] Date of Birth/Sex: Treating RN: 08-11-1955 (67 y.o. Michelle Gray Primary Care Michelle Gray: Michelle Gray Other Clinician: Referring Michelle Gray: Treating Michelle Gray/Extender: Michelle Gray Weeks in Treatment: 0 Allergies Active Allergies Compazine Allergy Notes Electronic Signature(s) Signed: 04/22/2022 7:48:54 AM By: Michelle East RN Entered By: Michelle Gray on 04/22/2022 07:48:54 -------------------------------------------------------------------------------- Arrival Information Details Patient Name: Date of Service: Michelle Oh C. 04/22/2022 8:00 A M Medical Record Number: [id number] Patient Account Number: [id number] Date of Birth/Sex: Treating RN: 1955/12/18 (67 y.o. F) Primary Care Michelle Gray: Michelle Gray Other Clinician: Referring Michelle Gray: Treating Michelle Gray/Extender: Michelle Gray Weeks in Treatment: 0 Visit Information Patient Arrived: Ambulatory Arrival Time: 07:53 Accompanied By: self Transfer Assistance: None Patient Identification Verified: Yes Secondary Verification Process Completed: Yes Electronic Signature(s) Signed: 04/22/2022 9:04:37 AM By: Michelle Gray Entered By: Michelle Gray on 04/22/2022 07:53:19 -------------------------------------------------------------------------------- Clinic Level of Care Assessment Details Patient Name: Date of Service: Michelle Gray 04/22/2022 8:00 A M Medical Record Number: [id number] Patient Account Number: [id number] Date of Birth/Sex: Treating RN: 21-Jun-1955 (67 y.o. Michelle Gray Primary Care Michelle Gray: Michelle Gray Other Clinician: Referring Michelle Gray: Treating Michelle Gray/Extender: Michelle Gray Weeks in Treatment: 0 Clinic Level of Care Assessment Items TOOL 2 Quantity Score X- 1 0 Use when only an EandM is performed on the INITIAL visit ASSESSMENTS - Nursing Assessment / Reassessment X- 1 20 General Physical Exam (combine w/ comprehensive assessment (listed just below) when performed on new pt. evals) X- 1 25 Comprehensive Assessment (HX, ROS, Risk Assessments, Wounds Hx, etc.) ASSESSMENTS - Wound and Skin A ssessment / Reassessment X - Simple Wound Assessment / Reassessment - one wound 1 5 YUXIN, HUDACK ([id number]) [phone] Page 2 of 7 []  - 0 Complex Wound Assessment / Reassessment - multiple wounds X- 1 10 Dermatologic / Skin Assessment (not related to wound area) ASSESSMENTS - Ostomy and/or Continence Assessment and Care []  - 0 Incontinence Assessment and Management []  - 0 Ostomy Care Assessment and Management (repouching, etc.) PROCESS - Coordination of Care X - Simple Patient / Family Education for ongoing care 1 15 []  - 0 Complex (extensive) Patient / Family Education for ongoing care X- 1 10 Staff obtains Programmer, systems, Records, T Results / Process Orders est X- 1 10 Staff telephones HHA, Nursing Homes / Clarify orders / etc []  - 0 Routine Transfer to another Facility (non-emergent condition) []  - 0 Routine Hospital Admission (non-emergent condition) X- 1 15 New Admissions / Biomedical engineer / Ordering NPWT Apligraf, etc. , []  - 0 Emergency Hospital Admission (emergent condition) []  - 0 Simple Discharge Coordination []  - 0 Complex (extensive) Discharge Coordination PROCESS - Special Needs []  - 0 Pediatric / Minor Patient Management []  - 0 Isolation Patient Management []  - 0 Hearing / Language / Visual special needs []  - 0 Assessment of Community assistance (transportation, D/C planning, etc.) []  - 0 Additional assistance / Altered mentation []  - 0 Support Surface(s) Assessment  (bed, cushion, seat, etc.) INTERVENTIONS - Wound Cleansing / Measurement X- 1 5 Wound Imaging (photographs - any number of wounds) []  - 0 Wound Tracing (instead of photographs) X- 1 5 Simple Wound Measurement - one wound []  - 0 Complex Wound Measurement - multiple wounds []  -  0 Simple Wound Cleansing - one wound []  - 0 Complex Wound Cleansing - multiple wounds INTERVENTIONS - Wound Dressings []  - 0 Small Wound Dressing one or multiple wounds []  - 0 Medium Wound Dressing one or multiple wounds []  - 0 Large Wound Dressing one or multiple wounds []  - 0 Application of Medications - injection INTERVENTIONS - Miscellaneous []  - 0 External ear exam []  - 0 Specimen Collection (cultures, biopsies, blood, body fluids, etc.) []  - 0 Specimen(s) / Culture(s) sent or taken to Lab for analysis []  - 0 Patient Transfer (multiple staff / Civil Service fast streamer / Similar devices) []  - 0 Simple Staple / Suture removal (25 or less) []  - 0 Complex Staple / Suture removal (26 or more) []  - 0 Hypo / Hyperglycemic Management (close monitor of Blood Glucose) []  - 0 Ankle / Brachial Index (ABI) - do not check if billed separately Michelle Gray, Michelle Gray ([id number]) [id number] Page 3 of 7 Has the patient been seen at the hospital within the last three years: Yes Total Score: 120 Level Of Care: New/Established - Level 4 Electronic Signature(s) Signed: 04/23/2022 4:19:16 PM By: Michelle East RN Entered By: Michelle Gray on 04/22/2022 08:33:36 -------------------------------------------------------------------------------- Encounter Discharge Information Details Patient Name: Date of Service: Michelle Gray Michelle C. 04/22/2022 8:00 A M Medical Record Number: [id number] Patient Account Number: [id number] Date of Birth/Sex: Treating RN: Jul 11, 1955 (67 y.o. Michelle Gray Primary Care Tiyonna Sardinha: Michelle Gray Other Clinician: Referring Alyxander Kollmann: Treating Cherylynn Liszewski/Extender: Michelle Gray Weeks in Treatment: 0 Encounter Discharge Information Items Discharge Condition: Stable Ambulatory Status: Ambulatory Discharge Destination: Home Transportation: Private Auto Accompanied By: self Schedule Follow-up Appointment: Yes Clinical Summary of Care: Electronic Signature(s) Signed: 04/22/2022 8:34:21 AM By: Michelle East RN Entered By: Michelle Gray on 04/22/2022 08:34:21 -------------------------------------------------------------------------------- Lower Extremity Assessment Details Patient Name: Date of Service: Michelle [place] C. 04/22/2022 8:00 A M Medical Record Number: [id number] Patient Account Number: [id number] Date of Birth/Sex: Treating RN: 30-Jun-1955 (67 y.o. Michelle Gray Primary Care Don Giarrusso: Michelle Gray Other Clinician: Referring Shanina Kepple: Treating Ellizabeth Dacruz/Extender: Michelle Gray Weeks in Treatment: 0 Edema Assessment Assessed: [Left: No] [Right: No] [Left: Edema] [Right: :] Calf Left: Right: Point of Measurement: From Medial Instep 34 cm Ankle Left: Right: Point of Measurement: From Medial Instep 21 cm Vascular Assessment Pulses: Dorsalis Pedis Palpable: [Right:Yes] Electronic Signature(s) Signed: 04/23/2022 4:19:16 PM By: Michelle East RN Entered By: Michelle Gray on 04/22/2022 08:10:43 Michelle Gray ([id number]) [id number] Page 4 of 7 -------------------------------------------------------------------------------- Multi Wound Chart Details Patient Name: Date of Service: Michelle Gray 04/22/2022 8:00 A M Medical Record Number: [id number] Patient Account Number: [id number] Date of Birth/Sex: Treating RN: 01/13/1956 (67 y.o. F) Primary Care Brystol Wasilewski: Michelle Gray Other Clinician: Referring Jahlon Baines: Treating Tameko Halder/Extender: Michelle Gray Weeks in Treatment: 0 Vital Signs Height(in): 67 Pulse(bpm): 81 Weight(lbs): 162 Blood  Pressure(mmHg): 113/70 Body Mass Index(BMI): 25.4 Temperature(F): 97.9 Respiratory Rate(breaths/min): 20 [1:Photos:] [N/A:N/A] Right, Anterior Knee N/A N/A Wound Location: Trauma N/A N/A Wounding Event: Skin T ear N/A N/A Primary Etiology: Asthma, Arrhythmia, Hypertension N/A N/A Comorbid History: 01/29/2022 N/A N/A Date Acquired: 0 N/A N/A Weeks of Treatment: Open N/A N/A Wound Status: No N/A N/A Wound Recurrence: 0.1x0.1x0.1 N/A N/A Measurements L x W x D (cm) 0.008 N/A N/A A (cm) : rea 0.001 N/A N/A Volume (cm) : Full Thickness Without Exposed N/A N/A Classification: Support Structures None Present N/A N/A Exudate Amount: Large (67-100%) N/A  N/A Granulation Amount: None Present (0%) N/A N/A Necrotic Amount: Fascia: No N/A N/A Exposed Structures: Fat Layer (Subcutaneous Tissue): No Tendon: No Muscle: No Joint: No Bone: No Scarring: Yes N/A N/A Periwound Skin Texture: Maceration: No N/A N/A Periwound Skin Moisture: Dry/Scaly: No No Abnormalities Noted N/A N/A Periwound Skin Color: No Abnormality N/A N/A Temperature: Treatment Notes Electronic Signature(s) Signed: 04/22/2022 8:30:24 AM By: Fredirick Maudlin MD FACS Entered By: Fredirick Maudlin on 04/22/2022 08:30:23 -------------------------------------------------------------------------------- Multi-Disciplinary Care Plan Details Patient Name: Date of Service: Michelle Gray Michelle C. 04/22/2022 8:00 A M Medical Record Number: [id number] Patient Account Number: [id number] Date of Birth/Sex: Treating RN: 1955-02-23 (67 y.o. Michelle Gray Primary Care Daevon Holdren: Michelle Gray Other Clinician: Referring Theodor Mustin: Treating Brady Plant/Extender: Michelle Gray Weeks in Treatment: [address] Michelle Gray, Michelle Gray ([id number]) [id number] Page 5 of 7 Electronic Signature(s) Signed: 04/22/2022 8:32:36 AM By: Michelle East RN Entered By: Michelle Gray on  04/22/2022 08:32:35 -------------------------------------------------------------------------------- Pain Assessment Details Patient Name: Date of Service: Michelle Oh C. 04/22/2022 8:00 A M Medical Record Number: [id number] Patient Account Number: [id number] Date of Birth/Sex: Treating RN: 04-23-1955 (67 y.o. F) Primary Care Breyton Vanscyoc: Michelle Gray Other Clinician: Referring Chelle Cayton: Treating Meagen Limones/Extender: Michelle Gray Weeks in Treatment: 0 Active Problems Location of Pain Severity and Description of Pain Patient Has Paino No Site Locations Rate the pain. Current Pain Level: 0 Pain Management and Medication Current Pain Management: Electronic Signature(s) Signed: 04/23/2022 4:19:16 PM By: Michelle East RN Entered By: Michelle Gray on 04/22/2022 08:21:06 -------------------------------------------------------------------------------- Patient/Caregiver Education Details Patient Name: Date of Service: Michelle Gray 3/20/2024andnbsp8:00 [place] Record Number: [id number] Patient Account Number: [id number] Date of Birth/Gender: Treating RN: 1955-05-05 (67 y.o. Michelle Gray Primary Care Physician: Michelle Gray Other Clinician: Referring Physician: Treating Physician/Extender: Michelle Gray Weeks in Treatment: 0 Education Assessment Education Provided To: Patient Education Topics Provided Electronic Signature(s) Signed: 04/23/2022 4:19:16 PM By: Michelle East RN Entered By: Michelle Gray on 04/22/2022 08:32:43 REENE, HARLACHER ([id number]) [id number] Page 6 of 7 -------------------------------------------------------------------------------- Wound Assessment Details Patient Name: Date of Service: Michelle Gray 04/22/2022 8:00 A M Medical Record Number: [id number] Patient Account Number: [id number] Date of Birth/Sex: Treating RN: 1955-06-22 (67 y.o. Michelle Gray, Michelle Gray Primary  Care Deondrea Aguado: Michelle Gray Other Clinician: Referring Michelle Gray: Treating Jen Eppinger/Extender: Michelle Gray Weeks in Treatment: 0 Wound Status Wound Number: 1 Primary Etiology: Skin Tear Wound Location: Right, Anterior Knee Wound Status: Open Wounding Event: Trauma Comorbid History: Asthma, Arrhythmia, Hypertension Date Acquired: 01/29/2022 Weeks Of Treatment: 0 Clustered Wound: No Photos Wound Measurements Length: (cm) 0.1 Width: (cm) 0.1 Depth: (cm) 0.1 Area: (cm) 0.008 Volume: (cm) 0.001 % Reduction in Area: % Reduction in Volume: Tunneling: No Undermining: No Wound Description Classification: Full Thickness Without Exposed Support Exudate Amount: None Present Structures Foul Odor After Cleansing: No Slough/Fibrino No Wound Bed Granulation Amount: Large (67-100%) Exposed Structure Necrotic Amount: None Present (0%) Fascia Exposed: No Fat Layer (Subcutaneous Tissue) Exposed: No Tendon Exposed: No Muscle Exposed: No Joint Exposed: No Bone Exposed: No Periwound Skin Texture Texture Color No Abnormalities Noted: No No Abnormalities Noted: Yes Scarring: Yes Temperature / Pain Temperature: No Abnormality Moisture No Abnormalities Noted: No Dry / Scaly: No Maceration: No Electronic Signature(s) Signed: 04/23/2022 4:19:16 PM By: Michelle East RN Entered By: Michelle Gray on 04/22/2022 08:14:15 Vitals Details -------------------------------------------------------------------------------- Michelle Gray ([id number]) [id number] Page 7 of 7  Patient Name: Date of Service: Michelle Gray 04/22/2022 8:00 A M Medical Record Number: [id number] Patient Account Number: [id number] Date of Birth/Sex: Treating RN: 08/25/1955 (67 y.o. F) Primary Care Dorrance Sellick: Michelle Gray Other Clinician: Referring Irais Mottram: Treating Jaydee Conran/Extender: Michelle Gray Weeks in Treatment: 0 Vital Signs Time  Taken: 07:53 Temperature (F): 97.9 Height (in): 67 Pulse (bpm): 82 Weight (lbs): 162 Respiratory Rate (breaths/min): 20 Body Mass Index (BMI): 25.4 Blood Pressure (mmHg): 113/70 Reference Range: 80 - 120 mg / dl Electronic Signature(s) Signed: 04/22/2022 9:04:37 AM By: Michelle Gray Entered By: Michelle Gray on 04/22/2022 07:54:28

## 2022-04-24 NOTE — Progress Notes (Signed)
Michelle Gray ([id number]) [id number] Page 1 of 4 Visit Report for 04/22/2022 Abuse Risk Screen Details Patient Name: Date of Service: Michelle Gray 04/22/2022 8:00 A M Medical Record Number: [id number] Patient Account Number: [id number] Date of Birth/Sex: Treating RN: [birth year] (67 y.o. Michelle Gray Primary Care Michelle Gray: Michelle Gray Other Clinician: Referring Michelle Gray: Treating Michelle Gray/Extender: Michelle Gray Weeks in Treatment: 0 Abuse Risk Screen Items Answer ABUSE RISK SCREEN: Has anyone close to you tried to hurt or harm you recentlyo No Do you feel uncomfortable with anyone in your familyo No Has anyone forced you do things that you didnt want to doo No Electronic Signature(s) Signed: 04/23/2022 4:19:16 PM By: Blanche East RN Entered By: Blanche East on 04/22/2022 08:08:04 -------------------------------------------------------------------------------- Activities of Daily Living Details Patient Name: Date of Service: Michelle Gray 04/22/2022 8:00 A M Medical Record Number: [id number] Patient Account Number: [id number] Date of Birth/Sex: Treating RN: Apr 06, 1955 (67 y.o. Michelle Gray Primary Care Michelle Gray: Michelle Gray Other Clinician: Referring Michelle Gray: Treating Michelle Gray/Extender: Michelle Gray Weeks in Treatment: 0 Activities of Daily Living Items Answer Activities of Daily Living (Please select one for each item) Drive Automobile Completely Able T Medications ake Completely Able Use T elephone Completely Able Care for Appearance Completely Able Use T oilet Completely Able Bath / Shower Completely Able Dress Self Completely Able Feed Self Completely Able Walk Completely Able Get In / Out Bed Completely Able Housework Completely Able Prepare Meals Completely Able Handle Money Completely Able Shop for Self Completely Able Electronic Signature(s) Signed:  04/23/2022 4:19:16 PM By: Blanche East RN Entered By: Blanche East on 04/22/2022 08:08:08 -------------------------------------------------------------------------------- Education Screening Details Patient Name: Date of Service: Michelle Oh C. 04/22/2022 8:00 A M Medical Record Number: [id number] Patient Account Number: [id number] Date of Birth/Sex: Treating RN: May 12, 1955 (67 y.o. Michelle Gray Primary Care Michelle Gray: Michelle Gray Other Clinician: Referring Michelle Gray: Treating Michelle Gray/Extender: Michelle Gray Weeks in Treatment: [address] LABELLA, GOLOB ([id number]) [id number] Page 2 of 4 Primary Learner Assessed: Patient Learning Preferences/Education Level/Primary Language Learning Preference: Explanation Highest Education Level: College or Above Preferred Language: English Cognitive Barrier Language Barrier: No Translator Needed: No Memory Deficit: No Emotional Barrier: No Cultural/Religious Beliefs Affecting Medical Care: No Physical Barrier Impaired Vision: Yes Glasses Impaired Hearing: No Decreased Hand dexterity: No Knowledge/Comprehension Knowledge Level: High Comprehension Level: High Ability to understand written instructions: High Ability to understand verbal instructions: High Motivation Anxiety Level: Calm Cooperation: Cooperative Education Importance: Acknowledges Need Interest in Health Problems: Asks Questions Perception: Coherent Willingness to Engage in Self-Management High Activities: Readiness to Engage in Self-Management High Activities: Electronic Signature(s) Signed: 04/23/2022 4:19:16 PM By: Blanche East RN Entered By: Blanche East on 04/22/2022 08:08:48 -------------------------------------------------------------------------------- Fall Risk Assessment Details Patient Name: Date of Service: Michelle Oh C. 04/22/2022 8:00 A M Medical Record Number: [id number] Patient Account  Number: [id number] Date of Birth/Sex: Treating RN: 1955/09/15 (67 y.o. Michelle Gray Primary Care Aften Lipsey: Michelle Gray Other Clinician: Referring Michelle Gray: Treating Michelle Gray/Extender: Michelle Gray Weeks in Treatment: 0 Fall Risk Assessment Items Have you had 2 or more falls in the last 12 monthso 0 No Have you had any fall that resulted in injury in the last 12 monthso 0 No FALLS RISK SCREEN History of falling - immediate or within 3 months 25 Yes Secondary diagnosis (Do you have 2 or more medical diagnoseso) 0 No Ambulatory  aid None/bed rest/wheelchair/nurse 0 Yes Crutches/cane/walker 0 No Furniture 0 No Intravenous therapy Access/Saline/Heparin Lock 0 No Gait/Transferring Normal/ bed rest/ wheelchair 0 Yes Weak (short steps with or without shuffle, stooped but able to lift head while walking, may seek 0 No support from furniture) Impaired (short steps with shuffle, may have difficulty arising from chair, head down, impaired 0 No balance) Mental Status Oriented to own ability 0 Yes Overestimates or forgets limitations 0 No Risk Level: Medium Risk Score: [address] C ([id number]) [phone] [id number] Page 3 of 4 Electronic Signature(s) -------------------------------------------------------------------------------- Foot Assessment Details Patient Name: Date of Service: Michelle Gray 04/22/2022 8:00 A M Medical Record Number: [id number] Patient Account Number: [id number] Date of Birth/Sex: Treating RN: 06/09/1955 (67 y.o. Michelle Gray Primary Care Michelle Gray: Michelle Gray Other Clinician: Referring Michelle Gray: Treating Michelle Gray/Extender: Michelle Gray Weeks in Treatment: 0 Foot Assessment Items Site Locations + = Sensation present, - = Sensation absent, C = Callus, U = Ulcer R = Redness, W = Warmth, M = Maceration, PU = Pre-ulcerative lesion F = Fissure, S = Swelling, D =  Dryness Assessment Right: Left: Other Deformity: No No Prior Foot Ulcer: No No Prior Amputation: No No Charcot Joint: No No Ambulatory Status: Ambulatory Without Help Gait: Steady Electronic Signature(s) Signed: 04/23/2022 4:19:16 PM By: Blanche East RN Entered By: Blanche East on 04/22/2022 08:10:02 -------------------------------------------------------------------------------- Nutrition Risk Screening Details Patient Name: Date of Service: Michelle Gray 04/22/2022 8:00 A M Medical Record Number: [id number] Patient Account Number: [id number] Date of Birth/Sex: Treating RN: 1955-09-09 (67 y.o. Michelle Gray Primary Care Mckensey Berghuis: Michelle Gray Other Clinician: Referring Yitzhak Awan: Treating Yilia Sacca/Extender: Michelle Gray Weeks in Treatment: 0 Height (in): 67 Weight (lbs): 162 Body Mass Index (BMI): 25.4 Michelle Gray, Michelle Gray ([id number]) [phone] [id number].pdf Page 4 of 4 Nutrition Risk Screening Items Score Screening NUTRITION RISK SCREEN: I have an illness or condition that made me change the kind and/or amount of food I eat 0 No I eat fewer than two meals per day 0 No I eat few fruits and vegetables, or milk products 0 No I have three or more drinks of beer, liquor or wine almost every day 0 No I have tooth or mouth problems that make it hard for me to eat 0 No I don't always have enough money to buy the food I need 0 No I eat alone most of the time 0 No I take three or more different prescribed or over-the-counter drugs a day 0 No Without wanting to, I have lost or gained 10 pounds in the last six months 2 Yes I am not always physically able to shop, cook and/or feed myself 0 No Nutrition Protocols Good Risk Protocol 0 No interventions needed Moderate Risk Protocol High Risk Proctocol Risk Level: Good Risk Score: 2 Electronic Signature(s) Signed: 04/23/2022 4:19:16 PM By: Blanche East RN Entered By: Blanche East  on 04/22/2022 08:09:54

## 2022-04-24 NOTE — Progress Notes (Signed)
Michelle Gray ([id number]) [id number] Page 1 of 7 Visit Report for 04/22/2022 Chief Complaint Document Details Patient Name: Date of Service: Michelle Gray 04/22/2022 8:00 A M Medical Record Number: [id number] Patient Account Number: [id number] Date of Birth/Sex: Treating RN: 03-May-1955 (67 y.o. F) Primary Care Provider: Leitha Bleak Other Clinician: Referring Provider: Treating Provider/Extender: Benedict Needy Weeks in Treatment: 0 Information Obtained from: Patient Chief Complaint Patient presents to the wound care center due with non-wound condition(s) Electronic Signature(s) Signed: 04/22/2022 8:30:34 AM By: Fredirick Maudlin MD FACS Entered By: Fredirick Maudlin on 04/22/2022 08:30:34 -------------------------------------------------------------------------------- HPI Details Patient Name: Date of Service: Michelle Oh C. 04/22/2022 8:00 A M Medical Record Number: [id number] Patient Account Number: [id number] Date of Birth/Sex: Treating RN: [birth year] (67 y.o. F) Primary Care Provider: Leitha Bleak Other Clinician: Referring Provider: Treating Provider/Extender: Benedict Needy Weeks in Treatment: 0 History of Present Illness HPI Description: CONSULTATION ONLY 04/22/2022 This is a 67 year old woman with a past medical history notable for intrinsic asthma, hypothyroidism, osteoarthritis of both hands, history of breast cancer (bilateral, status post mastectomy bilaterally). 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 It was treated onsite, cleaned, and glued. Subsequently, the wound opened and she treated it herself with wet-to-dry dressings. Subsequently, she began using a DuoDERM patch. She says that it finally closed within the last 2 weeks or so. Due to the fact that this is a Training and development officer case, however, she kept her appointment with me to confirm complete wound healing. On examination, there is a purple-red scar on the patient's anterior right knee. There is the beginning of possible scar hypertrophy; the patient does states she is prone to scar hypertrophy and shows me her mastectomy scar from a year ago. No concern for infection. Electronic Signature(s) Signed: 04/22/2022 8:33:17 AM By: Fredirick Maudlin MD FACS Entered By: Fredirick Maudlin on 04/22/2022 08:33:17 -------------------------------------------------------------------------------- Physical Exam Details Patient Name: Date of Service: Michelle Oh C. 04/22/2022 8:00 A M Medical Record Number: [id number] Patient Account Number: [id number] Date of Birth/Sex: Treating RN: [birth year] (67 y.o. F) Primary Care Provider: Leitha Bleak Other Clinician: Referring Provider: Treating Provider/Extender: Benedict Needy Weeks in Treatment: 0 Constitutional . . . . No acute distress. Respiratory Normal work of breathing on room air. Notes Michelle, Gray ([id number]) [id number] Page 2 of 7 04/22/2022: On examination, there is a purple-red scar on the patient's anterior right knee. There is the beginning of possible scar hypertrophy; the patient does states she is prone to scar hypertrophy and shows me her mastectomy scar from a year ago. No concern for infection. Electronic Signature(s) Signed: 04/22/2022 8:34:28 AM By: Fredirick Maudlin MD FACS Entered By: Fredirick Maudlin on 04/22/2022 08:34:28 -------------------------------------------------------------------------------- Physician Orders Details Patient Name: Date of Service: Michelle Rider BETH C. 04/22/2022 8:00 A M Medical Record Number: [id number] Patient Account Number: [id number] Date of Birth/Sex: Treating RN: [birth year] (67 y.o. Michelle Gray Primary Care Provider: Leitha Bleak Other  Clinician: Referring Provider: Treating Provider/Extender: Benedict Needy Weeks in Treatment: 0 Verbal / Phone Orders: No Diagnosis Coding ICD-10 Coding Code Description [id number] Laceration without foreign body, right knee, sequela J45.909 Unspecified asthma, uncomplicated Discharge From [facility] Discharge from [place] - Call us with any questions or concerns regarding your wounds Continue to use  vitamin E oil and massage Bathing/ Shower/ Hygiene May shower and wash wound with soap and water. Edema Control - Lymphedema / SCD / Other Exercise regularly Moisturize legs daily. Wound Treatment Electronic Signature(s) Signed: 04/22/2022 8:34:38 AM By: Fredirick Maudlin MD FACS Entered By: Fredirick Maudlin on 04/22/2022 08:34:38 -------------------------------------------------------------------------------- Problem List Details Patient Name: Date of Service: Michelle Oh C. 04/22/2022 8:00 A M Medical Record Number: [id number] Patient Account Number: [id number] Date of Birth/Sex: Treating RN: Jun 21, 1955 (67 y.o. F) Primary Care Provider: Leitha Bleak Other Clinician: Referring Provider: Treating Provider/Extender: Benedict Needy Weeks in Treatment: 0 Active Problems ICD-10 Encounter Code Description Active Date MDM Diagnosis S81.011S Laceration without foreign body, right knee, sequela 04/22/2022 No Yes J45.909 Unspecified asthma, uncomplicated [id number] No Yes Inactive Problems Michelle Gray, Michelle Gray ([id number]) [id number] Page 3 of 7 Resolved Problems Electronic Signature(s) Signed: 04/22/2022 8:30:14 AM By: Fredirick Maudlin MD FACS Entered By: Fredirick Maudlin on 04/22/2022 08:30:13 -------------------------------------------------------------------------------- Progress Note Details Patient Name: Date of Service: Michelle [place] C. 04/22/2022 8:00 A M Medical Record Number:  [id number] Patient Account Number: [id number] Date of Birth/Sex: Treating RN: [birth year] (67 y.o. F) Primary Care Provider: Leitha Bleak Other Clinician: Referring Provider: Treating Provider/Extender: Benedict Needy Weeks in Treatment: 0 Subjective Chief Complaint Information obtained from Patient Patient presents to the wound care center due with non-wound condition(s) History of Present Illness (HPI) CONSULTATION ONLY 04/22/2022 This is a 67 year old woman with a past medical history notable for intrinsic asthma, hypothyroidism, osteoarthritis of both hands, history of breast cancer (bilateral, status post mastectomy bilaterally). 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 It was treated onsite, cleaned, and glued. Subsequently, the wound opened and she treated it herself with wet-to-dry dressings. Subsequently, she began using a DuoDERM patch. She says that it finally closed within the last 2 weeks or so. Due to the fact that this is a Designer, jewellery case, however, she kept her appointment with me to confirm complete wound healing. On examination, there is a purple-red scar on the patient's anterior right knee. There is the beginning of possible scar hypertrophy; the patient does states she is prone to scar hypertrophy and shows me her mastectomy scar from a year ago. No concern for infection. Patient History Allergies Compazine Family History Cancer - Mother,Father,Paternal Grandparents, Hypertension - Mother,Siblings, Stroke - Father, Thyroid Problems - Father,Mother, No family history of Diabetes, Heart Disease, Hereditary Spherocytosis, Kidney Disease, Tuberculosis. Social History Never smoker, Marital Status - Married, Alcohol Use - Rarely, Drug Use - No History, Caffeine Use - Daily. Medical History Eyes Denies history of Cataracts, Glaucoma,  Optic Neuritis Hematologic/Lymphatic Denies history of Anemia, Hemophilia, Human Immunodeficiency Virus, Lymphedema Respiratory Patient has history of Asthma Denies history of Chronic Obstructive Pulmonary Disease (COPD), Pneumothorax, Sleep Apnea, Tuberculosis Cardiovascular Patient has history of Arrhythmia - PVCs, Hypertension Gastrointestinal Denies history of Cirrhosis , Colitis, Crohnoos, Hepatitis A, Hepatitis B, Hepatitis C Endocrine Denies history of Type I Diabetes, Type II Diabetes Genitourinary Denies history of End Stage Renal Disease Integumentary (Skin) Denies history of History of Burn Musculoskeletal Denies history of Gout, Rheumatoid Arthritis, Osteoarthritis, Osteomyelitis Neurologic Denies history of Dementia, Neuropathy, Quadriplegia, Paraplegia Medical A Surgical History Notes nd Musculoskeletal arthritis Review of Systems (ROS) Constitutional Symptoms (General Health) Denies complaints or symptoms of Fatigue, Fever, Chills, Marked Weight Change. Eyes Denies complaints or symptoms  of Dry Eyes, Vision Changes, Glasses / Contacts. Ear/Nose/Mouth/Throat Denies complaints or symptoms of Chronic sinus problems or rhinitis. Respiratory Michelle Gray, Michelle Gray ([id number]) [id number] Page 4 of 7 Denies complaints or symptoms of Chronic or frequent coughs, Shortness of Breath. Cardiovascular Denies complaints or symptoms of Chest pain. Gastrointestinal Denies complaints or symptoms of Frequent diarrhea, Nausea, Vomiting. Endocrine hypothyroidism Genitourinary Denies complaints or symptoms of Frequent urination. Integumentary (Skin) right knee Musculoskeletal Denies complaints or symptoms of Muscle Pain, Muscle Weakness. Neurologic Denies complaints or symptoms of Numbness/parasthesias, plantar fasciitis Oncologic Breast cancer Objective Constitutional No acute distress. Vitals Time Taken: 7:53 AM, Height: 67 in, Weight: 162  lbs, BMI: 25.4, Temperature: 97.9 F, Pulse: 82 bpm, Respiratory Rate: 20 breaths/min, Blood Pressure: 113/70 mmHg. Respiratory Normal work of breathing on room air. General Notes: 04/22/2022: On examination, there is a purple-red scar on the patient's anterior right knee. There is the beginning of possible scar hypertrophy; the patient does states she is prone to scar hypertrophy and shows me her mastectomy scar from a year ago. No concern for infection. Integumentary (Hair, Skin) Wound #1 status is Open. Original cause of wound was Trauma. The date acquired was: 01/29/2022. The wound is located on the Right,Anterior Knee. The wound measures 0.1cm length x 0.1cm width x 0.1cm depth; 0.008cm^2 area and 0.001cm^3 volume. There is no tunneling or undermining noted. There is a none present amount of drainage noted. There is large (67-100%) granulation within the wound bed. There is no necrotic tissue within the wound bed. The periwound skin appearance had no abnormalities noted for color. The periwound skin appearance exhibited: Scarring. The periwound skin appearance did not exhibit: Dry/Scaly, Maceration. Periwound temperature was noted as No Abnormality. Assessment Active Problems ICD-10 Laceration without foreign body, right knee, sequela Unspecified asthma, uncomplicated Plan Discharge From [facility] Services: Discharge from [place] - Call us with any questions or concerns regarding your wounds Continue to use vitamin E oil and massage Bathing/ Shower/ Hygiene: May shower and wash wound with soap and water. Edema Control - Lymphedema / SCD / Other: Exercise regularly Moisturize legs daily. 04/22/2022: This is a 67 year old woman who fell while at work in December and suffered a laceration to her knee. She says that it healed within the last couple of weeks. On examination, there is a purple-red scar on the patient's anterior right knee. There is the beginning of possible scar  hypertrophy; the patient does states she is prone to scar hypertrophy and shows me her mastectomy scar from a year ago. No concern for infection. Her wound is completely healed. I did discuss various modalities for managing her scar, including massage with a topical emollient agent such as vitamin E oil, silicone scar strips, and if this fails, Kenalog injections. She does not require treatment in the wound care center at this time. She may follow-up as needed. Michelle Gray, Michelle Gray ([id number]) [id number] Page 5 of 7 Electronic Signature(s) Signed: 04/22/2022 8:35:39 AM By: Fredirick Maudlin MD FACS Entered By: Fredirick Maudlin on 04/22/2022 08:35:39 -------------------------------------------------------------------------------- HxROS Details Patient Name: Date of Service: Michelle Rider BETH C. 04/22/2022 8:00 A M Medical Record Number: [id number] Patient Account Number: [id number] Date of Birth/Sex: Treating RN: 22-May-1955 (67 y.o. Michelle Gray Primary Care Provider: Leitha Bleak Other Clinician: Referring Provider: Treating Provider/Extender: Benedict Needy Weeks in Treatment: 0 Constitutional Symptoms (General Health) Complaints and Symptoms: Negative for: Fatigue; Fever; Chills; Marked Weight Change Eyes Complaints and Symptoms: Negative for: Dry Eyes; Vision Changes;  Glasses / Contacts Medical History: Negative for: Cataracts; Glaucoma; Optic Neuritis Ear/Nose/Mouth/Throat Complaints and Symptoms: Negative for: Chronic sinus problems or rhinitis Respiratory Complaints and Symptoms: Negative for: Chronic or frequent coughs; Shortness of Breath Medical History: Positive for: Asthma Negative for: Chronic Obstructive Pulmonary Disease (COPD); Pneumothorax; Sleep Apnea; Tuberculosis Cardiovascular Complaints and Symptoms: Negative for: Chest pain Medical History: Positive for: Arrhythmia - PVCs;  Hypertension Gastrointestinal Complaints and Symptoms: Negative for: Frequent diarrhea; Nausea; Vomiting Medical History: Negative for: Cirrhosis ; Colitis; Crohns; Hepatitis A; Hepatitis B; Hepatitis C Genitourinary Complaints and Symptoms: Negative for: Frequent urination Medical History: Negative for: End Stage Renal Disease Musculoskeletal Complaints and Symptoms: Negative for: Muscle Pain; Muscle Weakness Medical History: Negative for: Gout; Rheumatoid Arthritis; Osteoarthritis; Osteomyelitis Past Medical History Notes: arthritis Neurologic Complaints and Symptoms: Michelle Gray, Michelle Gray ([id number]) [id number] Page 6 of 7 Negative for: Numbness/parasthesias Review of System Notes: plantar fasciitis Medical History: Negative for: Dementia; Neuropathy; Quadriplegia; Paraplegia Hematologic/Lymphatic Medical History: Negative for: Anemia; Hemophilia; Human Immunodeficiency Virus; Lymphedema Endocrine Complaints and Symptoms: Review of System Notes: hypothyroidism Medical History: Negative for: Type I Diabetes; Type II Diabetes Immunological Integumentary (Skin) Complaints and Symptoms: Review of System Notes: right knee Medical History: Negative for: History of Burn Oncologic Complaints and Symptoms: Review of System Notes: Breast cancer Immunizations Pneumococcal Vaccine: Received Pneumococcal Vaccination: Yes Received Pneumococcal Vaccination On or After 60th Birthday: No Implantable Devices None Family and Social History Cancer: Yes - Mother,Father,Paternal Grandparents; Diabetes: No; Heart Disease: No; Hereditary Spherocytosis: No; Hypertension: Yes - Mother,Siblings; Kidney Disease: No; Stroke: Yes - Father; Thyroid Problems: Yes - Father,Mother; Tuberculosis: No; Never smoker; Marital Status - Married; Alcohol Use: Rarely; Drug Use: No History; Caffeine Use: Daily; Financial Concerns: No; Food, Clothing or Shelter Needs: No;  Support System Lacking: No; Transportation Concerns: No Physician Affirmation I have reviewed and agree with the above information. Electronic Signature(s) Signed: 04/22/2022 9:03:36 AM By: Fredirick Maudlin MD FACS Signed: 04/23/2022 4:19:16 PM By: Michelle East RN Entered By: Michelle Gray on 04/22/2022 08:08:00 -------------------------------------------------------------------------------- SuperBill Details Patient Name: Date of Service: Michelle Gray 04/22/2022 Medical Record Number: [id number] Patient Account Number: [id number] Date of Birth/Sex: Treating RN: March 03, 1955 (67 y.o. Michelle Gray Primary Care Provider: Leitha Bleak Other Clinician: Referring Provider: Treating Provider/Extender: Benedict Needy Weeks in Treatment: 0 Diagnosis Coding ICD-10 Codes Code Description Michelle Gray, Michelle Gray ([id number]) [phone].pdf Page 7 of 7 S81.011S Laceration without foreign body, right knee, sequela J45.909 Unspecified asthma, uncomplicated Facility Procedures : CPT4 Code: [id number] Description: 99214 - WOUND CARE VISIT-LEV 4 EST Gray Modifier: 25 Quantity: 1 Physician Procedures : CPT4 Code Description Modifier [id number] Michelle Gray ICD-10 Diagnosis Description [id number] Laceration without foreign body, right knee, sequela J45.909 Unspecified asthma, uncomplicated Quantity: 1 Electronic Signature(s) Signed: 04/22/2022 8:35:52 AM By: Fredirick Maudlin MD FACS Previous Signature: 04/22/2022 8:33:50 AM Version By: Michelle East RN Entered By: Fredirick Maudlin on 04/22/2022 08:35:52

## 2022-05-05 ENCOUNTER — Other Ambulatory Visit: Payer: Self-pay | Admitting: Rheumatology

## 2022-05-05 NOTE — Telephone Encounter (Signed)
Last Fill: 02/03/2022  Next Visit: 09/22/2022  Last Visit: 03/23/2022  Dx: Primary insomnia   Current Dose per office note on 03/23/2022: Ambien 10 mg p.o. nightly   Okay to refill Ambien?

## 2022-05-11 DIAGNOSIS — Z23 Encounter for immunization: Secondary | ICD-10-CM | POA: Diagnosis not present

## 2022-05-20 ENCOUNTER — Other Ambulatory Visit: Payer: Self-pay | Admitting: Cardiology

## 2022-05-21 DIAGNOSIS — Z6825 Body mass index (BMI) 25.0-25.9, adult: Secondary | ICD-10-CM | POA: Diagnosis not present

## 2022-05-21 DIAGNOSIS — I493 Ventricular premature depolarization: Secondary | ICD-10-CM | POA: Diagnosis not present

## 2022-05-21 DIAGNOSIS — Z853 Personal history of malignant neoplasm of breast: Secondary | ICD-10-CM | POA: Diagnosis not present

## 2022-05-21 DIAGNOSIS — E039 Hypothyroidism, unspecified: Secondary | ICD-10-CM | POA: Diagnosis not present

## 2022-05-21 DIAGNOSIS — M797 Fibromyalgia: Secondary | ICD-10-CM | POA: Diagnosis not present

## 2022-05-21 DIAGNOSIS — I2510 Atherosclerotic heart disease of native coronary artery without angina pectoris: Secondary | ICD-10-CM | POA: Diagnosis not present

## 2022-05-21 DIAGNOSIS — J45909 Unspecified asthma, uncomplicated: Secondary | ICD-10-CM | POA: Diagnosis not present

## 2022-05-21 DIAGNOSIS — E782 Mixed hyperlipidemia: Secondary | ICD-10-CM | POA: Diagnosis not present

## 2022-05-21 DIAGNOSIS — M810 Age-related osteoporosis without current pathological fracture: Secondary | ICD-10-CM | POA: Diagnosis not present

## 2022-05-21 DIAGNOSIS — E559 Vitamin D deficiency, unspecified: Secondary | ICD-10-CM | POA: Diagnosis not present

## 2022-05-31 ENCOUNTER — Encounter: Payer: Self-pay | Admitting: Cardiology

## 2022-06-02 MED ORDER — DILTIAZEM HCL ER COATED BEADS 180 MG PO CP24: 180.0000 mg | ORAL_CAPSULE | Freq: Every day | ORAL | 2 refills | Status: DC

## 2022-06-02 NOTE — Telephone Encounter (Signed)
Yes lets increase the diltiazem to 180 mg daily

## 2022-06-06 ENCOUNTER — Other Ambulatory Visit: Payer: Self-pay | Admitting: Cardiology

## 2022-06-09 ENCOUNTER — Other Ambulatory Visit: Payer: Self-pay | Admitting: *Deleted

## 2022-06-09 MED ORDER — GABAPENTIN 300 MG PO CAPS: 600.0000 mg | ORAL_CAPSULE | Freq: Every day | ORAL | 2 refills | Status: DC

## 2022-06-09 NOTE — Telephone Encounter (Signed)
Last Fill: 01/02/2022  Next Visit: 09/22/2022  Last Visit: 03/23/2022  Dx:  fibromyalgia   Current Dose per office note on 03/23/2022: not mentioned  Okay to refill Gabapentin?

## 2022-06-09 NOTE — Telephone Encounter (Signed)
Refill request received via fax from [address] for Gabapentin   Last Fill: 01/02/2022  Next Visit: 09/22/2022  Last Visit: 03/23/2022  Dx: Fibromyalgia   Current Dose per office note on 03/23/2022: not discussed  Okay to refill Gabapentin?

## 2022-06-15 DIAGNOSIS — H40013 Open angle with borderline findings, low risk, bilateral: Secondary | ICD-10-CM | POA: Diagnosis not present

## 2022-06-23 ENCOUNTER — Other Ambulatory Visit: Payer: Self-pay | Admitting: Rheumatology

## 2022-06-23 NOTE — Telephone Encounter (Signed)
Last Fill: 04/01/2022  Labs: 03/17/2022  MCV 98  Next Visit: 09/22/2022  Last Visit: 03/23/2022  DX: DDD (degenerative disc disease), lumbar   Current Dose per office note 03/23/2022: meloxicam 15 mg p.o. daily   Okay to refill Meloxicam?

## 2022-06-24 ENCOUNTER — Encounter: Payer: Self-pay | Admitting: Cardiology

## 2022-06-25 MED ORDER — DILTIAZEM HCL ER COATED BEADS 240 MG PO CP24: 240.0000 mg | ORAL_CAPSULE | Freq: Every day | ORAL | 2 refills | Status: DC

## 2022-06-25 NOTE — Telephone Encounter (Signed)
Yes lets try diltiazem 240 mg daily.  Would check blood pressure/heart rate once daily for next 2 weeks and let us know results

## 2022-07-26 NOTE — Progress Notes (Unsigned)
Cardiology Office Note:    Date:  07/28/2022   ID:  Michelle Gray, DOB Jan 08, 1956, MRN [id number]  PCP:  Gweneth Dimitri, MD  Cardiologist:  Little Ishikawa, MD  Electrophysiologist:  None   Referring MD: Gweneth Dimitri, MD   Chief Complaint  Patient presents with   Palpitations    History of Present Illness:    Michelle Gray is a 67 y.o. female with a hx of fibromyalgia, asthma, hyperlipidemia, breast cancer, hypothyroidism who returns for follow-up.  She was initially seen on 12/08/2018, she was referred by Dr. Corliss Blacker for evaluation of palpitations and chest pain.    TTE was done on 12/20/2018, which showed normal LV systolic function, normal RV function, no significant valvular disease.  Cardiac monitor showed no significant arrhythmias, with the patient triggered events corresponding to sinus rhythm plus or minus PACs.  Coronary CTA was done on 01/14/2019, which showed nonobstructive CAD with calcified plaque in the proximal LAD causing minimal (0-24%) stenosis and noncalcified plaque in the proximal RCA causing minimal (0-24%) stenosis.  Calcium score was 32 (73rd percentile for age/gender).  Zio patch x 7 days 01/2022 showed 2 episodes of NSVT, longest lasting 5 beats and 45 episodes of SVT with longest lasting 18.5 seconds and occasional PVCs (1.7% of beats).  Echocardiogram 02/20/2022 showed EF 55 to 60%, normal RV function, no significant valvular disease.  Since last clinic visit, she reports she is doing well.  Reports her palpitations have improved significantly since increasing diltiazem dose to 240 mg daily and stopped drinking caffeine.  Reports BP in the 100s to 110s over 60s to 70s.  Denies any chest pain, dyspnea, lower extremity edema.  Reports some lightheadedness when starting diltiazem but none recently.  She denies any syncope.   Past Medical History:  Diagnosis Date   Arthritis    Asthma    Cancer [facility]) 1990   right breast ca-mast with reconstr    Cancer [facility]) 12/2020   left breast DCIS   Eczema    dx by derm per patient   Fibromyalgia    Hyperlipidemia    Hypothyroidism    Neuromuscular disorder (HCC)    Osteoporosis    PONV (postoperative nausea and vomiting)    PVC (premature ventricular contraction)    Thyroid disease     Past Surgical History:  Procedure Laterality Date   APPENDECTOMY     BREAST IMPLANT REMOVAL Right 02/11/2021   Procedure: REMOVAL RIGHT BREAST IMPLANT;  Surgeon: Glenna Fellows, MD;  Location: [facility];  Service: Plastics;  Laterality: Right;   BREAST SURGERY Right 1990   Mastectomy   CAPSULECTOMY Right 02/11/2021   Procedure: CAPSULECTOMY;  Surgeon: Glenna Fellows, MD;  Location: [facility];  Service: Plastics;  Laterality: Right;   FOOT SURGERY     LAPROSCOPIC     TOTAL MASTECTOMY Left 02/11/2021   Procedure: LEFT TOTAL MASTECTOMY;  Surgeon: Emelia Loron, MD;  Location:  SURGERY CENTER;  Service: General;  Laterality: Left;   WISDOM TEETH REMOVAL      Current Medications: No outpatient medications have been marked as taking for the 07/28/22 encounter (Office Visit) with Little Ishikawa, MD.     Allergies:   Compazine and Tape   Social History   Socioeconomic History   Marital status: Married    Spouse name: Not on file   Number of children: Not on file   Years of education: Not on file   Highest education level: Not  on file  Occupational History   Not on file  Tobacco Use   Smoking status: Never    Passive exposure: Past   Smokeless tobacco: Never  Vaping Use   Vaping Use: Never used  Substance and Sexual Activity   Alcohol use: Yes    Comment: social   Drug use: Never   Sexual activity: Not on file  Other Topics Concern   Not on file  Social History Narrative   Not on file   Social Determinants of Health   Financial Resource Strain: Not on file  Food Insecurity: Not on file  Transportation Needs: Not on file   Physical Activity: Not on file  Stress: Not on file  Social Connections: Not on file     Family History: The patient's family history includes Cancer in her mother; Cancer (age of onset: 72) in her father; Dementia in her mother; Hyperlipidemia in her father and mother; Hypertension in her brother and mother; Prostate cancer in her father; Stroke in her father and mother.  ROS:   Please see the history of present illness.    All other systems reviewed and are negative.  EKGs/Labs/Other Studies Reviewed:    The following studies were reviewed today:   EKG:   04/09/21: NSR with first degree AV block, LAD, Q waves in V1-3 (old) 04/06/22: NSR, rate 69, first degree AV block, iRBBB, Q waves in V1-3 (old), LAD   Recent Labs: No results found for requested labs within last 365 days.  Recent Lipid Panel No results found for: "CHOL", "TRIG", "HDL", "CHOLHDL", "VLDL", "LDLCALC", "LDLDIRECT"   TTE 12/20/18:  1. Left ventricular ejection fraction, by visual estimation, is 60 to  65%. The left ventricle has normal function. There is no left ventricular  hypertrophy. Normal diastolic function.   2. Global right ventricle has normal systolic function.The right  ventricular size is normal. No increase in right ventricular wall  thickness.   3. Left atrial size was normal.   4. Right atrial size was normal.   5. Moderate thickening of the mitral valve leaflet(s).   6. The mitral valve is normal in structure. Trace mitral valve  regurgitation.   7. The tricuspid valve is normal in structure. Tricuspid valve  regurgitation is trivial.   8. The aortic valve was not well visualized. Aortic valve regurgitation  is not visualized. No evidence of aortic valve sclerosis or stenosis.   9. The pulmonic valve was not well visualized. Pulmonic valve  regurgitation is not visualized.  10. Mildly elevated pulmonary artery systolic pressure.  11. The inferior vena cava is normal in size with greater than  50%  respiratory variability, suggesting right atrial pressure of 3 mmHg.  12. The tricuspid regurgitant velocity is 2.31 m/s, and with an assumed  right atrial pressure of 10 mmHg, the estimated right ventricular systolic  pressure is mildly elevated at 31.3 mmHg.   CTA 01/14/19: 1. Coronary calcium score of 32. This was 52 percentile for age and sex matched control. 2.  Normal coronary origin with right dominance. 3. Nonobstructive CAD, with calcified plaque in the proximal LAD causing minimal (0-24%) stenosis and noncalcified plaque in the proximal RCA causing minimal (0-24%) stenosis   CAD-RADS 1. Minimal non-obstructive CAD (0-24%). Consider non-atherosclerotic causes of chest pain. Consider preventive therapy and risk factor modification.  Noncardiac: IMPRESSION: 1.  Aortic Atherosclerosis (ICD10-I70.0).  Cardiac monitor 02/16/19: No significant arrhythmias Patient triggered events corresponded to sinus rhythm +/- PACs   Predominant rhythm is sinus  rhythm. Range is 58-142 bpm with average of 78 bpm. No atrial fibrillation, sustained ventricular tachycardia, significant pause, or high degree AV block. Total ectopy <1%. 20 patient triggered events, corresponding to sinus rhythm  +/- PACs  No significant abnormalities.  Physical Exam:    VS:  BP 120/74   Pulse 75   Ht 5' 7.25" (1.708 m)   Wt 165 lb 6.4 oz (75 kg)   SpO2 94%   BMI 25.71 kg/m     Wt Readings from Last 3 Encounters:  07/28/22 165 lb 6.4 oz (75 kg)  04/06/22 165 lb 6.4 oz (75 kg)  03/23/22 161 lb (73 kg)     GEN:  Well nourished, well developed in no acute distress HEENT: Normal NECK: No JVD; No carotid bruits CARDIAC: RRR, no murmurs, rubs, gallops RESPIRATORY:  Expiratory wheezing ABDOMEN: Soft, non-tender, non-distended MUSCULOSKELETAL:  No edema; No deformity  SKIN: Warm and dry NEUROLOGIC:  Alert and oriented x 3 PSYCHIATRIC:  Normal affect   ASSESSMENT:    1. Coronary artery disease  involving native coronary artery of native heart without angina pectoris   2. Palpitations   3. Hyperlipidemia, unspecified hyperlipidemia type       PLAN:    Coronary artery disease: Coronary CTA on 01/14/2019  showed nonobstructive CAD with calcified plaque in the proximal LAD causing minimal (0-24%) stenosis and noncalcified plaque in the proximal RCA causing minimal (0-24%) stenosis.  Calcium score was 32 (73rd percentile for age/gender) -Rosuvastatin increased to 40 mg daily, developed myalgias, so was decreased to 20 mg daily and Zetia 10 mg daily was added.  LDL 61 on 03/17/22. Continue rosuvastatin and Zetia.  Palpitations: Zio patch x 7 days 01/2022 showed 2 episodes of NSVT, longest lasting 5 beats and 45 episodes of SVT with longest lasting 18.5 seconds and occasional PVCs (1.7% of beats).  Echocardiogram 02/20/2022 showed EF 55 to 60%, normal RV function, no significant valvular disease.  Normal electrolytes and TSH on labs 03/2022. -She had been on Toprol for her palpitations, with description concerning for symptomatic PVCs.  Symptoms not improving with metoprolol, was switched to diltiazem.  Currently on diltiazem 240 mg daily and stopped caffeine, reports significant improvement in palpitations.  Continue diltiazem  Hyperlipidemia: Continue rosuvastatin 20 mg daily and Zetia 10 mg daily,  LDL 61 on 03/17/22   RTC in 6 months  Medication Adjustments/Labs and Tests Ordered: Current medicines are reviewed at length with the patient today.  Concerns regarding medicines are outlined above.  No orders of the defined types were placed in this encounter.  Meds ordered this encounter  Medications   gabapentin (NEURONTIN) 300 MG capsule    Sig: Take 1 capsule (300 mg total) by mouth at bedtime.    Dispense:  60 capsule    Refill:  2    Updated dose   diltiazem (CARDIZEM CD) 240 MG 24 hr capsule    Sig: Take 1 capsule (240 mg total) by mouth daily.    Dispense:  90 capsule     Refill:  3    Dose change    Patient Instructions  Medication Instructions:  No changes *If you need a refill on your cardiac medications before your next appointment, please call your pharmacy*  Follow-Up: At [facility], you and your health needs are our priority.  As part of our continuing mission to provide you with exceptional heart care, we have created designated Provider Care Teams.  These Care Teams include your primary Cardiologist (  physician) and Advanced Practice Providers (APPs -  Physician Assistants and Nurse Practitioners) who all work together to provide you with the care you need, when you need it.  We recommend signing up for the patient portal called "MyChart".  Sign up information is provided on this After Visit Summary.  MyChart is used to connect with patients for Virtual Visits (Telemedicine).  Patients are able to view lab/test results, encounter notes, upcoming appointments, etc.  Non-urgent messages can be sent to your provider as well.   To learn more about what you can do with MyChart, go to [email or web address].    Your next appointment:   6 month(s)  Provider:   Little Ishikawa, MD       Signed, Little Ishikawa, MD  07/28/2022 5:21 PM    [facility]

## 2022-07-28 ENCOUNTER — Ambulatory Visit: Payer: Medicare Other | Attending: Cardiology | Admitting: Cardiology

## 2022-07-28 VITALS — BP 120/74 | HR 75 | Ht 67.25 in | Wt 165.4 lb

## 2022-07-28 DIAGNOSIS — I2510 Atherosclerotic heart disease of native coronary artery without angina pectoris: Secondary | ICD-10-CM | POA: Diagnosis not present

## 2022-07-28 DIAGNOSIS — R002 Palpitations: Secondary | ICD-10-CM | POA: Diagnosis not present

## 2022-07-28 DIAGNOSIS — E785 Hyperlipidemia, unspecified: Secondary | ICD-10-CM | POA: Diagnosis not present

## 2022-07-28 MED ORDER — DILTIAZEM HCL ER COATED BEADS 240 MG PO CP24: 240.0000 mg | ORAL_CAPSULE | Freq: Every day | ORAL | 3 refills | Status: DC

## 2022-07-28 MED ORDER — GABAPENTIN 300 MG PO CAPS: 300.0000 mg | ORAL_CAPSULE | Freq: Every day | ORAL | 2 refills | Status: DC

## 2022-07-28 NOTE — Patient Instructions (Signed)
Medication Instructions:  No changes *If you need a refill on your cardiac medications before your next appointment, please call your pharmacy*  Follow-Up: At [facility], you and your health needs are our priority.  As part of our continuing mission to provide you with exceptional heart care, we have created designated Provider Care Teams.  These Care Teams include your primary Cardiologist (physician) and Advanced Practice Providers (APPs -  Physician Assistants and Nurse Practitioners) who all work together to provide you with the care you need, when you need it.  We recommend signing up for the patient portal called "MyChart".  Sign up information is provided on this After Visit Summary.  MyChart is used to connect with patients for Virtual Visits (Telemedicine).  Patients are able to view lab/test results, encounter notes, upcoming appointments, etc.  Non-urgent messages can be sent to your provider as well.   To learn more about what you can do with MyChart, go to [email or web address].    Your next appointment:   6 month(s)  Provider:   Little Ishikawa, MD

## 2022-07-31 DIAGNOSIS — M25511 Pain in right shoulder: Secondary | ICD-10-CM | POA: Diagnosis not present

## 2022-07-31 DIAGNOSIS — M2022 Hallux rigidus, left foot: Secondary | ICD-10-CM | POA: Diagnosis not present

## 2022-08-13 DIAGNOSIS — M545 Low back pain, unspecified: Secondary | ICD-10-CM | POA: Diagnosis not present

## 2022-08-13 DIAGNOSIS — M5451 Vertebrogenic low back pain: Secondary | ICD-10-CM | POA: Diagnosis not present

## 2022-08-14 ENCOUNTER — Other Ambulatory Visit (HOSPITAL_COMMUNITY): Payer: Self-pay | Admitting: Orthopedic Surgery

## 2022-08-24 ENCOUNTER — Other Ambulatory Visit (HOSPITAL_COMMUNITY): Payer: Self-pay | Admitting: Specialist

## 2022-08-24 DIAGNOSIS — M5459 Other low back pain: Secondary | ICD-10-CM

## 2022-08-24 DIAGNOSIS — M5451 Vertebrogenic low back pain: Secondary | ICD-10-CM | POA: Diagnosis not present

## 2022-08-25 DIAGNOSIS — M5459 Other low back pain: Secondary | ICD-10-CM | POA: Diagnosis not present

## 2022-08-25 DIAGNOSIS — M5416 Radiculopathy, lumbar region: Secondary | ICD-10-CM | POA: Diagnosis not present

## 2022-08-27 ENCOUNTER — Other Ambulatory Visit (HOSPITAL_BASED_OUTPATIENT_CLINIC_OR_DEPARTMENT_OTHER): Payer: Medicare Other

## 2022-08-27 ENCOUNTER — Other Ambulatory Visit: Payer: Self-pay | Admitting: Rheumatology

## 2022-08-27 NOTE — Telephone Encounter (Signed)
Last Fill: 05/05/2022  Next Visit: 01/14/2023  Last Visit: 03/23/2022  Dx: Primary insomnia   Current Dose per office note on 03/23/2022: Ambien 10 mg p.o. nightly   Okay to refill Ambien?

## 2022-08-28 ENCOUNTER — Ambulatory Visit: Payer: 59 | Admitting: Physical Therapy

## 2022-08-31 DIAGNOSIS — Z170 Estrogen receptor positive status [ER+]: Secondary | ICD-10-CM | POA: Diagnosis not present

## 2022-08-31 DIAGNOSIS — M25511 Pain in right shoulder: Secondary | ICD-10-CM | POA: Diagnosis not present

## 2022-08-31 DIAGNOSIS — M19011 Primary osteoarthritis, right shoulder: Secondary | ICD-10-CM | POA: Diagnosis not present

## 2022-08-31 DIAGNOSIS — C50912 Malignant neoplasm of unspecified site of left female breast: Secondary | ICD-10-CM | POA: Diagnosis not present

## 2022-09-03 DIAGNOSIS — M5451 Vertebrogenic low back pain: Secondary | ICD-10-CM | POA: Diagnosis not present

## 2022-09-04 ENCOUNTER — Encounter: Payer: Self-pay | Admitting: Cardiology

## 2022-09-06 DIAGNOSIS — J029 Acute pharyngitis, unspecified: Secondary | ICD-10-CM | POA: Diagnosis not present

## 2022-09-06 NOTE — Telephone Encounter (Signed)
Yes that is fine.  Michelle Gray, can we represcribe the diltiazem 180 mg pills?

## 2022-09-07 ENCOUNTER — Other Ambulatory Visit: Payer: Self-pay

## 2022-09-07 MED ORDER — DILTIAZEM HCL ER COATED BEADS 180 MG PO CP24: 180.0000 mg | ORAL_CAPSULE | Freq: Every day | ORAL | 3 refills | Status: DC

## 2022-09-08 DIAGNOSIS — R070 Pain in throat: Secondary | ICD-10-CM | POA: Diagnosis not present

## 2022-09-08 DIAGNOSIS — M542 Cervicalgia: Secondary | ICD-10-CM | POA: Diagnosis not present

## 2022-09-08 DIAGNOSIS — Z6825 Body mass index (BMI) 25.0-25.9, adult: Secondary | ICD-10-CM | POA: Diagnosis not present

## 2022-09-09 ENCOUNTER — Ambulatory Visit: Payer: Medicare Other | Attending: Obstetrics & Gynecology

## 2022-09-09 ENCOUNTER — Other Ambulatory Visit: Payer: Self-pay

## 2022-09-09 DIAGNOSIS — R252 Cramp and spasm: Secondary | ICD-10-CM

## 2022-09-09 DIAGNOSIS — M5459 Other low back pain: Secondary | ICD-10-CM | POA: Insufficient documentation

## 2022-09-09 DIAGNOSIS — R262 Difficulty in walking, not elsewhere classified: Secondary | ICD-10-CM | POA: Insufficient documentation

## 2022-09-09 DIAGNOSIS — M6281 Muscle weakness (generalized): Secondary | ICD-10-CM | POA: Diagnosis not present

## 2022-09-09 NOTE — Therapy (Signed)
OUTPATIENT PHYSICAL THERAPY THORACOLUMBAR EVALUATION   Patient Name: Michelle Gray MRN: [id number] DOB:[birth year], 67 y.o., female Today's Date: 09/09/2022  END OF SESSION:  PT End of Session - 09/09/22 0933     Visit Number 1    Date for PT Re-Evaluation 11/04/22    Authorization Type [employer]    PT Start Time [phone]    PT Stop Time 1015    PT Time Calculation (min) 42 min    Activity Tolerance Patient tolerated treatment well    Behavior During Therapy [facility] for tasks assessed/performed             Past Medical History:  Diagnosis Date   Arthritis    Asthma    Cancer (HCC) 1990   right breast ca-mast with reconstr   Cancer [facility]) 12/2020   left breast DCIS   Eczema    dx by derm per patient   Fibromyalgia    Hyperlipidemia    Hypothyroidism    Neuromuscular disorder (HCC)    Osteoporosis    PONV (postoperative nausea and vomiting)    PVC (premature ventricular contraction)    Thyroid disease    Past Surgical History:  Procedure Laterality Date   APPENDECTOMY     BREAST IMPLANT REMOVAL Right 02/11/2021   Procedure: REMOVAL RIGHT BREAST IMPLANT;  Surgeon: Glenna Fellows, MD;  Location: [facility];  Service: Plastics;  Laterality: Right;   BREAST SURGERY Right 1990   Mastectomy   CAPSULECTOMY Right 02/11/2021   Procedure: CAPSULECTOMY;  Surgeon: Glenna Fellows, MD;  Location: [facility];  Service: Plastics;  Laterality: Right;   FOOT SURGERY     LAPROSCOPIC     TOTAL MASTECTOMY Left 02/11/2021   Procedure: LEFT TOTAL MASTECTOMY;  Surgeon: Emelia Loron, MD;  Location: [facility];  Service: General;  Laterality: Left;   WISDOM TEETH REMOVAL     Patient Active Problem List   Diagnosis Date Noted   Breast cancer, left breast (HCC) 02/11/2021   Cough variant asthma with ? component upper airway cough syndrome 12/23/2018   Primary osteoarthritis of both hands 02/21/2016   Fibromyalgia 02/21/2016   History of  hypothyroidism 02/21/2016   Dyslipidemia 02/21/2016   History of breast cancer 02/21/2016   Age-related osteoporosis without current pathological fracture 02/21/2016   Unspecified hypothyroidism 08/06/2013   Other and unspecified hyperlipidemia 08/06/2013   Insomnia 08/06/2013   Intrinsic asthma 08/06/2013   Hip pain, left 01/12/2011   Plantar fasciitis 01/12/2011    PCP: Pollyann Savoy, MD   REFERRING PROVIDER: Jene Every, MD  REFERRING DIAG: M54.59 (ICD-10-CM) - Other low back pain  Rationale for Evaluation and Treatment: Rehabilitation  THERAPY DIAG:  Other low back pain - Plan: PT plan of care cert/re-cert  Difficulty in walking, not elsewhere classified - Plan: PT plan of care cert/re-cert  Muscle weakness (generalized) - Plan: PT plan of care cert/re-cert  Cramp and spasm - Plan: PT plan of care cert/re-cert  ONSET DATE: 08/24/2022  SUBJECTIVE:  SUBJECTIVE STATEMENT: Patient was doing well with her back issues after last episode of PT.  She retired in March and feels she may not be moving as much due to "just being bored".  She admits she was doing some yardwork as well which seems to have contributed to the issues.  She has a left great toe fusion coming up with Dr. Victorino Dike as well.  She is experiencing a return of her pain and this is now becoming functionally limiting.  A recent MRI reveals progression of the degenerative changes including levoconvex scoliosis and antero and retro listhesis at different levels.  She will be having ESI soon.  She hopes to get updated instruction and guidance on how to resume her HEP safely and to gain control of her low back pain again to be able to resume prior level of function.    PERTINENT HISTORY:  Recent   PAIN:  Are you having pain?  Yes,  varies throughout the day.  6-8/10  PRECAUTIONS: None  RED FLAGS: None   WEIGHT BEARING RESTRICTIONS: No  FALLS:  Has patient fallen in last 6 months? Yes. Number of falls tripped over folded up mat going out of a door   OCCUPATION: Retired Publishing rights manager  PLOF: Independent, Independent with basic ADLs, Independent with household mobility without device, Independent with community mobility without device, Independent with homemaking with ambulation, Independent with gait, and Independent with transfers  PATIENT GOALS: She hopes to get updated instruction and guidance on how to resume her HEP safely and to gain control of her low back pain again to be able to resume prior level of function.  NEXT MD VISIT: prn  OBJECTIVE:   DIAGNOSTIC FINDINGS:  MRI recent:  shows progression of previous issues  PATIENT SURVEYS:  FOTO 57, predicted 47  SCREENING FOR RED FLAGS: Bowel or bladder incontinence: No Spinal tumors: No Cauda equina syndrome: No Compression fracture: No Abdominal aneurysm: No  COGNITION: Overall cognitive status: Within functional limits for tasks assessed     SENSATION: WFL  MUSCLE LENGTH: Patient has continued restriction in bilateral hamstrings to approx 60 degrees on left and 50 degrees on right, positive Thomas test bilaterally  POSTURE:  Obvious acquired scoliosis on flexion    LUMBAR ROM:   AROM eval  Flexion Fingertips to ankles  Extension WNL  Right lateral flexion Fingertips to just above joint line  Left lateral flexion Fingertips to just above joint line  Right rotation WNL  Left rotation WNL   (Blank rows = not tested)  LOWER EXTREMITY ROM:     WFL  LOWER EXTREMITY MMT:    No significant issues or myotomal weaknesses  LUMBAR SPECIAL TESTS:  Thomas test: Positive  FUNCTIONAL TESTS:  5 times sit to stand: complete next visit Timed up and go (TUG): complete next visit  GAIT: Distance walked: 30 Assistive device utilized:  None Level of assistance: Complete Independence Comments: guarded/antalgic  TODAY'S TREATMENT:  DATE: 09/09/22 Initial eval completed and initiated HEP    PATIENT EDUCATION:  Education details: Initiated HEP Person educated: Patient Education method: Programmer, multimedia, Facilities manager, Verbal cues, and Handouts Education comprehension: verbalized understanding, returned demonstration, and verbal cues required  HOME EXERCISE PROGRAM: Access Code: [email or web address] URL: [email or web address] Date: 09/09/2022 Prepared by: Mikey Kirschner  Exercises - Standing Hamstring Stretch on Chair  - 1 x daily - 7 x weekly - 1 sets - 3 reps - 30 sec hold - Standing Quad Stretch with Rotation  - 1 x daily - 7 x weekly - 1 sets - 3 reps - 30 hold - Seated Piriformis Stretch with Trunk Bend  - 1 x daily - 7 x weekly - 3 sets - 10 reps - Seated Piriformis Stretch  - 1 x daily - 7 x weekly - 3 sets - 10 reps - Supine Pelvic Tilt  - 1 x daily - 7 x weekly - 3 sets - 10 reps - Supine 90/90 Alternating Heel Touches with Posterior Pelvic Tilt  - 1 x daily - 7 x weekly - 2 sets - 10 reps - Supine Dead Bug with Leg Extension  - 1 x daily - 7 x weekly - 2 sets - 10 reps - Supine Hip Internal and External Rotation  - 1 x daily - 7 x weekly - 1 sets - 5 reps - 10 sec hold - Supine ITB Stretch with Strap  - 1 x daily - 7 x weekly - 1 sets - 3 reps - 20 hold - Hooklying Clamshell with Resistance  - 1 x daily - 7 x weekly - 3 sets - 10 reps - Clamshell with Resistance  - 1 x daily - 7 x weekly - 3 sets - 10 reps ASSESSMENT:  CLINICAL IMPRESSION: Patient is a 67 y.o. female who was seen today for physical therapy evaluation and treatment for low back pain.  She presents with good functional ROM but restriction in hamstring, hip flexors, quads and hip rotators.  She has generalized pain at the low  back and into left hip and buttock.  Hip ROM is somewhat limited on left.  Her most recent MRI shows progression of all of her previous issues in the lumbar spine.  She has recently retired and admits she has been more sedentary but has also recently been doing a lot of work out in her yard which has contributed to her pain.  Upon reviewing her previous HEP, she was unable to reproduce her simple stretches without heavy verbal cues.  She would benefit from skilled PT for resuming her HEP with modifications and gradual progression back into the higher level core exercises.    OBJECTIVE IMPAIRMENTS: difficulty walking, decreased ROM, decreased strength, impaired flexibility, postural dysfunction, and pain.   ACTIVITY LIMITATIONS: carrying, lifting, bending, sitting, standing, squatting, sleeping, and bed mobility  PARTICIPATION LIMITATIONS: meal prep, cleaning, laundry, driving, shopping, community activity, and yard work  PERSONAL FACTORS: Past/current experiences and 1-2 comorbidities: OA, left great toe pain  are also affecting patient's functional outcome.   REHAB POTENTIAL: Good  CLINICAL DECISION MAKING: Stable/uncomplicated  EVALUATION COMPLEXITY: Low   GOALS: Goals reviewed with patient? Yes  SHORT TERM GOALS: Target date: 10/07/2022   Patient will be independent with initial HEP  Baseline: Goal status: INITIAL  2.  Pain report to be no greater than 4/10  Baseline:  Goal status: INITIAL   LONG TERM GOALS: Target date: 11/04/2022   Patient to be independent with advanced HEP  Baseline:  Goal status: INITIAL  2.  Patient to report pain no greater than 2/10  Baseline:  Goal status: INITIAL  3.  Patient to be able to stand or walk for at least 15 min without leg pain or  Baseline:  Goal status: INITIAL  4.  Patient to be able to sleep through the night  Baseline:  Goal status: INITIAL  5.  Patient to report 85% improvement in overall symptoms  Baseline:  Goal  status: INITIAL  6.  FOTO score to reach predicted goal Baseline:  Goal status: INITIAL  PLAN:  PT FREQUENCY: 1-2x/week  PT DURATION: 8 weeks  PLANNED INTERVENTIONS: Therapeutic exercises, Therapeutic activity, Neuromuscular re-education, Balance training, Gait training, Patient/Family education, Self Care, Joint mobilization, Aquatic Therapy, Dry Needling, Electrical stimulation, Spinal mobilization, Cryotherapy, Moist heat, Taping, Traction, Ultrasound, Manual therapy, and Re-evaluation.  PLAN FOR NEXT SESSION: Nustep, review HEP, resume core strengthening from previous episode.     Victorino Dike B. , PT 09/09/22 1:54 PM [facility] [address], [address] [place], [place] [address] Phone # [phone] Fax [phone]

## 2022-09-15 ENCOUNTER — Ambulatory Visit: Payer: Medicare Other | Admitting: Physical Therapy

## 2022-09-15 DIAGNOSIS — M6281 Muscle weakness (generalized): Secondary | ICD-10-CM | POA: Diagnosis not present

## 2022-09-15 DIAGNOSIS — M5459 Other low back pain: Secondary | ICD-10-CM | POA: Diagnosis not present

## 2022-09-15 DIAGNOSIS — R262 Difficulty in walking, not elsewhere classified: Secondary | ICD-10-CM | POA: Diagnosis not present

## 2022-09-15 NOTE — Therapy (Signed)
OUTPATIENT PHYSICAL THERAPY THORACOLUMBAR PROGRESS NOTE   Patient Name: Michelle Gray MRN: [id number] DOB:[birth year], 67 y.o., female Today's Date: 09/15/2022  END OF SESSION:  PT End of Session - 09/15/22 0753     Visit Number 2    Date for PT Re-Evaluation 11/04/22    Authorization Type [employer]    PT Start Time 0758    PT Stop Time 0837    PT Time Calculation (min) 39 min    Activity Tolerance Patient tolerated treatment well             Past Medical History:  Diagnosis Date   Arthritis    Asthma    Cancer (HCC) 1990   right breast ca-mast with reconstr   Cancer [facility]) 12/2020   left breast DCIS   Eczema    dx by derm per patient   Fibromyalgia    Hyperlipidemia    Hypothyroidism    Neuromuscular disorder (HCC)    Osteoporosis    PONV (postoperative nausea and vomiting)    PVC (premature ventricular contraction)    Thyroid disease    Past Surgical History:  Procedure Laterality Date   APPENDECTOMY     BREAST IMPLANT REMOVAL Right 02/11/2021   Procedure: REMOVAL RIGHT BREAST IMPLANT;  Surgeon: Glenna Fellows, MD;  Location: [facility];  Service: Plastics;  Laterality: Right;   BREAST SURGERY Right 1990   Mastectomy   CAPSULECTOMY Right 02/11/2021   Procedure: CAPSULECTOMY;  Surgeon: Glenna Fellows, MD;  Location: [facility];  Service: Plastics;  Laterality: Right;   FOOT SURGERY     LAPROSCOPIC     TOTAL MASTECTOMY Left 02/11/2021   Procedure: LEFT TOTAL MASTECTOMY;  Surgeon: Emelia Loron, MD;  Location: [facility];  Service: General;  Laterality: Left;   WISDOM TEETH REMOVAL     Patient Active Problem List   Diagnosis Date Noted   Breast cancer, left breast (HCC) 02/11/2021   Cough variant asthma with ? component upper airway cough syndrome 12/23/2018   Primary osteoarthritis of both hands 02/21/2016   Fibromyalgia 02/21/2016   History of hypothyroidism 02/21/2016   Dyslipidemia 02/21/2016    History of breast cancer 02/21/2016   Age-related osteoporosis without current pathological fracture 02/21/2016   Unspecified hypothyroidism 08/06/2013   Other and unspecified hyperlipidemia 08/06/2013   Insomnia 08/06/2013   Intrinsic asthma 08/06/2013   Hip pain, left 01/12/2011   Plantar fasciitis 01/12/2011    PCP: Pollyann Savoy, MD   REFERRING PROVIDER: Jene Every, MD  REFERRING DIAG: M54.59 (ICD-10-CM) - Other low back pain  Rationale for Evaluation and Treatment: Rehabilitation  THERAPY DIAG:  Other low back pain  Difficulty in walking, not elsewhere classified  Muscle weakness (generalized)  ONSET DATE: 08/24/2022  SUBJECTIVE:  SUBJECTIVE STATEMENT: I've been doing the last 4 exercises she gave me last time.  I'm not sure about the pelvic tilt one-it says on the instructions to do an anterior tilt first then a posterior tilt?  I get my back injection L5-S1 Thursday morning.  This is the first one.     PERTINENT HISTORY:  Recent   PAIN:  Are you having pain?  Yes, varies throughout the day.  1/10 8/13: Sitting is the worst; worse end of day Better in the AM, standing, lying  PRECAUTIONS: None  RED FLAGS: None   WEIGHT BEARING RESTRICTIONS: No  FALLS:  Has patient fallen in last 6 months? Yes. Number of falls tripped over folded up mat going out of a door   OCCUPATION: Retired Publishing rights manager  PLOF: Independent, Independent with basic ADLs, Independent with household mobility without device, Independent with community mobility without device, Independent with homemaking with ambulation, Independent with gait, and Independent with transfers  PATIENT GOALS: She hopes to get updated instruction and guidance on how to resume her HEP safely and to gain control of her  low back pain again to be able to resume prior level of function.  NEXT MD VISIT: prn  OBJECTIVE:   DIAGNOSTIC FINDINGS:  MRI recent:  shows progression of previous issues  PATIENT SURVEYS:  FOTO 57, predicted 10  SCREENING FOR RED FLAGS: Bowel or bladder incontinence: No Spinal tumors: No Cauda equina syndrome: No Compression fracture: No Abdominal aneurysm: No  COGNITION: Overall cognitive status: Within functional limits for tasks assessed     SENSATION: WFL  MUSCLE LENGTH: Patient has continued restriction in bilateral hamstrings to approx 60 degrees on left and 50 degrees on right, positive Thomas test bilaterally  POSTURE:  Obvious acquired scoliosis on flexion    LUMBAR ROM:   AROM eval  Flexion Fingertips to ankles  Extension WNL  Right lateral flexion Fingertips to just above joint line  Left lateral flexion Fingertips to just above joint line  Right rotation WNL  Left rotation WNL   (Blank rows = not tested)  LOWER EXTREMITY ROM:     WFL  LOWER EXTREMITY MMT:    No significant issues or myotomal weaknesses  LUMBAR SPECIAL TESTS:  Thomas test: Positive  FUNCTIONAL TESTS:  5 times sit to stand: complete next visit Timed up and go (TUG): complete next visit  GAIT: Distance walked: 30 Assistive device utilized: None Level of assistance: Complete Independence Comments: guarded/antalgic  TODAY'S TREATMENT:     DATE: 09/15/22 Nu-step L5 5 min while discussing status 2nd step HS stretch dynamically 10x right/left 2nd step pelvic tilt posterior first then hip flexor stretch with UE raise and reach over dynamically 10x right/left Review of seated piriformis stretch (long term restriction left hip external rotation) Supine piriformis stretch; used blue ball to assist Supine pelvic tilt (discussed just focusing on posterior direction since that feels the best) Supine heel taps 10x SLS with activation of glutes to "stand tall" on left Hip hinge  with golf club maintaining 3 points of contact 2x 10; discussed functional use of the hip hinge for yard work, Public affairs consultant, picking up heavier things where 2 hands are needed (she uses golfer's lift for small objects) Sit to stand holding 2 5# dumbbells at shoulders 8x (challenged by this)  DATE: 09/09/22 Initial eval completed and initiated HEP    PATIENT EDUCATION:  Education details: Initiated HEP Person educated: Patient Education method: Programmer, multimedia, Facilities manager, Verbal cues, and Handouts Education comprehension: verbalized understanding, returned demonstration, and verbal cues required  HOME EXERCISE PROGRAM: Access Code: [email or web address] URL: [email or web address] Date: 09/15/2022 Prepared by: Lavinia Sharps  Exercises - Standing Hamstring Stretch on Chair  - 1 x daily - 7 x weekly - 1 sets - 3 reps - 30 sec hold - Standing Quad Stretch with Rotation  - 1 x daily - 7 x weekly - 1 sets - 3 reps - 30 hold - Seated Piriformis Stretch with Trunk Bend  - 1 x daily - 7 x weekly - 3 sets - 10 reps - Seated Piriformis Stretch  - 1 x daily - 7 x weekly - 3 sets - 10 reps - Supine Pelvic Tilt  - 1 x daily - 7 x weekly - 3 sets - 10 reps - Supine 90/90 Alternating Heel Touches with Posterior Pelvic Tilt  - 1 x daily - 7 x weekly - 2 sets - 10 reps - Supine Dead Bug with Leg Extension  - 1 x daily - 7 x weekly - 2 sets - 10 reps - Supine Hip Internal and External Rotation  - 1 x daily - 7 x weekly - 1 sets - 5 reps - 10 sec hold - Supine ITB Stretch with Strap  - 1 x daily - 7 x weekly - 1 sets - 3 reps - 20 hold - Hooklying Clamshell with Resistance  - 1 x daily - 7 x weekly - 3 sets - 10 reps - Clamshell with Resistance  - 1 x daily - 7 x weekly - 3 sets - 10 reps - Standing Hamstring Stretch with Step  - 1 x daily - 7 x weekly - 1 sets - 10 reps - Standing Knee  Flexion Stretch on Step  - 1 x daily - 7 x weekly - 1 sets - 10 reps - Standing Hip Hinge with Dowel  - 1 x daily - 7 x weekly - 1 sets - 10 reps - Sit to Stand  - 1 x daily - 7 x weekly - 1 sets - 8 reps ASSESSMENT:  CLINICAL IMPRESSION: Pain level remains low throughout session.  Good carryover with initial HEP.  Instructed in the hip hinge concept for functional mobility with bending/stooping.  The golf club behind the back provides excellent proprioceptive feedback for keeping the spine neutral.  Verbal cues for wider stance secondary to history of knee pain.  Therapist monitoring response to all interventions and modifying treatment accordingly.      OBJECTIVE IMPAIRMENTS: difficulty walking, decreased ROM, decreased strength, impaired flexibility, postural dysfunction, and pain.   ACTIVITY LIMITATIONS: carrying, lifting, bending, sitting, standing, squatting, sleeping, and bed mobility  PARTICIPATION LIMITATIONS: meal prep, cleaning, laundry, driving, shopping, community activity, and yard work  PERSONAL FACTORS: Past/current experiences and 1-2 comorbidities: OA, left great toe pain  are also affecting patient's functional outcome.   REHAB POTENTIAL: Good  CLINICAL DECISION MAKING: Stable/uncomplicated  EVALUATION COMPLEXITY: Low   GOALS: Goals reviewed with patient? Yes  SHORT TERM GOALS: Target date: 10/07/2022   Patient will be independent with initial HEP  Baseline: Goal status: INITIAL  2.  Pain report to be no greater than 4/10  Baseline:  Goal status: INITIAL   LONG TERM GOALS: Target date: 11/04/2022   Patient to be independent with advanced HEP  Baseline:  Goal  status: INITIAL  2.  Patient to report pain no greater than 2/10  Baseline:  Goal status: INITIAL  3.  Patient to be able to stand or walk for at least 15 min without leg pain or  Baseline:  Goal status: INITIAL  4.  Patient to be able to sleep through the night  Baseline:  Goal status:  INITIAL  5.  Patient to report 85% improvement in overall symptoms  Baseline:  Goal status: INITIAL  6.  FOTO score to reach predicted goal Baseline:  Goal status: INITIAL  PLAN:  PT FREQUENCY: 1-2x/week  PT DURATION: 8 weeks  PLANNED INTERVENTIONS: Therapeutic exercises, Therapeutic activity, Neuromuscular re-education, Balance training, Gait training, Patient/Family education, Self Care, Joint mobilization, Aquatic Therapy, Dry Needling, Electrical stimulation, Spinal mobilization, Cryotherapy, Moist heat, Taping, Traction, Ultrasound, Manual therapy, and Re-evaluation.  PLAN FOR NEXT SESSION: Nustep,  HEP progression, resume core strengthening from previous episode.  Pt having ESI on 8/15  Lavinia Sharps, PT 09/15/22 9:34 AM Phone: [phone] Fax: [phone]   [facility] [address] [place], [place] [address] Phone # [phone] Fax [phone]

## 2022-09-17 ENCOUNTER — Ambulatory Visit (HOSPITAL_BASED_OUTPATIENT_CLINIC_OR_DEPARTMENT_OTHER): Admission: RE | Admit: 2022-09-17 | Payer: Medicare Other | Source: Home / Self Care | Admitting: Orthopedic Surgery

## 2022-09-17 ENCOUNTER — Encounter (HOSPITAL_BASED_OUTPATIENT_CLINIC_OR_DEPARTMENT_OTHER): Admission: RE | Payer: Self-pay | Source: Home / Self Care

## 2022-09-17 DIAGNOSIS — M5416 Radiculopathy, lumbar region: Secondary | ICD-10-CM | POA: Diagnosis not present

## 2022-09-17 SURGERY — FUSION, JOINT, GREAT TOE
Anesthesia: General | Site: Toe | Laterality: Left

## 2022-09-21 ENCOUNTER — Ambulatory Visit: Payer: Medicare Other | Admitting: Rehabilitative and Restorative Service Providers"

## 2022-09-21 ENCOUNTER — Other Ambulatory Visit (HOSPITAL_COMMUNITY): Payer: Self-pay | Admitting: Orthopedic Surgery

## 2022-09-22 ENCOUNTER — Ambulatory Visit: Payer: Medicare Other | Admitting: Rheumatology

## 2022-09-23 ENCOUNTER — Ambulatory Visit: Payer: Medicare Other

## 2022-09-23 DIAGNOSIS — R262 Difficulty in walking, not elsewhere classified: Secondary | ICD-10-CM | POA: Diagnosis not present

## 2022-09-23 DIAGNOSIS — M5459 Other low back pain: Secondary | ICD-10-CM

## 2022-09-23 DIAGNOSIS — M25552 Pain in left hip: Secondary | ICD-10-CM

## 2022-09-23 DIAGNOSIS — M6281 Muscle weakness (generalized): Secondary | ICD-10-CM | POA: Diagnosis not present

## 2022-09-23 DIAGNOSIS — R252 Cramp and spasm: Secondary | ICD-10-CM

## 2022-09-23 NOTE — Therapy (Signed)
OUTPATIENT PHYSICAL THERAPY THORACOLUMBAR PROGRESS NOTE   Patient Name: Michelle Gray MRN: [id number] DOB:[birth year], 67 y.o., female Today's Date: 09/23/2022  END OF SESSION:  PT End of Session - 09/23/22 1157     Visit Number 3    Date for PT Re-Evaluation 11/04/22    PT Start Time 1148    PT Stop Time 1226    PT Time Calculation (min) 38 min    Activity Tolerance Patient tolerated treatment well    Behavior During Therapy [facility] for tasks assessed/performed             Past Medical History:  Diagnosis Date   Arthritis    Asthma    Cancer (HCC) 1990   right breast ca-mast with reconstr   Cancer [facility]) 12/2020   left breast DCIS   Eczema    dx by derm per patient   Fibromyalgia    Hyperlipidemia    Hypothyroidism    Neuromuscular disorder (HCC)    Osteoporosis    PONV (postoperative nausea and vomiting)    PVC (premature ventricular contraction)    Thyroid disease    Past Surgical History:  Procedure Laterality Date   APPENDECTOMY     BREAST IMPLANT REMOVAL Right 02/11/2021   Procedure: REMOVAL RIGHT BREAST IMPLANT;  Surgeon: Glenna Fellows, MD;  Location: [facility];  Service: Plastics;  Laterality: Right;   BREAST SURGERY Right 1990   Mastectomy   CAPSULECTOMY Right 02/11/2021   Procedure: CAPSULECTOMY;  Surgeon: Glenna Fellows, MD;  Location: [facility];  Service: Plastics;  Laterality: Right;   FOOT SURGERY     LAPROSCOPIC     TOTAL MASTECTOMY Left 02/11/2021   Procedure: LEFT TOTAL MASTECTOMY;  Surgeon: Emelia Loron, MD;  Location: [facility];  Service: General;  Laterality: Left;   WISDOM TEETH REMOVAL     Patient Active Problem List   Diagnosis Date Noted   Breast cancer, left breast (HCC) 02/11/2021   Cough variant asthma with ? component upper airway cough syndrome 12/23/2018   Primary osteoarthritis of both hands 02/21/2016   Fibromyalgia 02/21/2016   History of hypothyroidism 02/21/2016    Dyslipidemia 02/21/2016   History of breast cancer 02/21/2016   Age-related osteoporosis without current pathological fracture 02/21/2016   Unspecified hypothyroidism 08/06/2013   Other and unspecified hyperlipidemia 08/06/2013   Insomnia 08/06/2013   Intrinsic asthma 08/06/2013   Hip pain, left 01/12/2011   Plantar fasciitis 01/12/2011    PCP: Pollyann Savoy, MD   REFERRING PROVIDER: Jene Every, MD  REFERRING DIAG: M54.59 (ICD-10-CM) - Other low back pain  Rationale for Evaluation and Treatment: Rehabilitation  THERAPY DIAG:  Other low back pain  Difficulty in walking, not elsewhere classified  Muscle weakness (generalized)  Cramp and spasm  Pain in left hip  ONSET DATE: 08/24/2022  SUBJECTIVE:  SUBJECTIVE STATEMENT: Patient reports she had injection last Thursday.  She was fairly sore and was having to take Tylenol.  She "just took it easy" for a few days and she got her new chair so that helped.  Pain today 1/10.     PERTINENT HISTORY:  Recent   PAIN:  09/23/22 Are you having pain?  Yes, varies throughout the day.  1/10  PRECAUTIONS: None  RED FLAGS: None   WEIGHT BEARING RESTRICTIONS: No  FALLS:  Has patient fallen in last 6 months? Yes. Number of falls tripped over folded up mat going out of a door   OCCUPATION: Retired Publishing rights manager  PLOF: Independent, Independent with basic ADLs, Independent with household mobility without device, Independent with community mobility without device, Independent with homemaking with ambulation, Independent with gait, and Independent with transfers  PATIENT GOALS: She hopes to get updated instruction and guidance on how to resume her HEP safely and to gain control of her low back pain again to be able to resume prior level of  function.  NEXT MD VISIT: prn  OBJECTIVE:   DIAGNOSTIC FINDINGS:  MRI recent:  shows progression of previous issues  PATIENT SURVEYS:  FOTO 57, predicted 77  SCREENING FOR RED FLAGS: Bowel or bladder incontinence: No Spinal tumors: No Cauda equina syndrome: No Compression fracture: No Abdominal aneurysm: No  COGNITION: Overall cognitive status: Within functional limits for tasks assessed     SENSATION: WFL  MUSCLE LENGTH: Patient has continued restriction in bilateral hamstrings to approx 60 degrees on left and 50 degrees on right, positive Thomas test bilaterally  POSTURE:  Obvious acquired scoliosis on flexion    LUMBAR ROM:   AROM eval  Flexion Fingertips to ankles  Extension WNL  Right lateral flexion Fingertips to just above joint line  Left lateral flexion Fingertips to just above joint line  Right rotation WNL  Left rotation WNL   (Blank rows = not tested)  LOWER EXTREMITY ROM:     WFL  LOWER EXTREMITY MMT:    No significant issues or myotomal weaknesses  LUMBAR SPECIAL TESTS:  Thomas test: Positive  FUNCTIONAL TESTS:  5 times sit to stand: complete next visit Timed up and go (TUG): complete next visit  GAIT: Distance walked: 30 Assistive device utilized: None Level of assistance: Complete Independence Comments: guarded/antalgic  TODAY'S TREATMENT:     DATE: 09/23/22 Nu-step L5 5 min while discussing status Standing hamstring stretch 3 x 30 sec each LE at barre with foot on stacked 8 and 6 inch steps Same step height pelvic tilt posterior first then hip flexor stretch with UE raise and reach over dynamically 10x right/left Seated piriformis stretch 3 x 30 sec both Supine pelvic tilt x 20 PPT with 90/90 heel taps x 20 PPT with dying but x 20 PPT with knee and hip flexion/extension 2 x 10 each LE PPT with SLR to shoulder extension with 4 lb dumbbell 2 x 10 each  Hooklying trunk rotation x 20  DATE: 09/15/22 Nu-step L5 5 min while  discussing status 2nd step HS stretch dynamically 10x right/left 2nd step pelvic tilt posterior first then hip flexor stretch with UE raise and reach over dynamically 10x right/left Review of seated piriformis stretch (long term restriction left hip external rotation) Supine piriformis stretch; used blue ball to assist Supine pelvic tilt (discussed just focusing on posterior direction since that feels the best) Supine heel taps 10x SLS with activation of glutes to "stand tall" on left Hip hinge with  golf club maintaining 3 points of contact 2x 10; discussed functional use of the hip hinge for yard work, Public affairs consultant, picking up heavier things where 2 hands are needed (she uses golfer's lift for small objects) Sit to stand holding 2 5# dumbbells at shoulders 8x (challenged by this)                                                                                                                         DATE: 09/09/22 Initial eval completed and initiated HEP    PATIENT EDUCATION:  Education details: Initiated HEP Person educated: Patient Education method: Programmer, multimedia, Facilities manager, Verbal cues, and Handouts Education comprehension: verbalized understanding, returned demonstration, and verbal cues required  HOME EXERCISE PROGRAM: Access Code: [email or web address] URL: [email or web address] Date: 09/15/2022 Prepared by: Lavinia Sharps  Exercises - Standing Hamstring Stretch on Chair  - 1 x daily - 7 x weekly - 1 sets - 3 reps - 30 sec hold - Standing Quad Stretch with Rotation  - 1 x daily - 7 x weekly - 1 sets - 3 reps - 30 hold - Seated Piriformis Stretch with Trunk Bend  - 1 x daily - 7 x weekly - 3 sets - 10 reps - Seated Piriformis Stretch  - 1 x daily - 7 x weekly - 3 sets - 10 reps - Supine Pelvic Tilt  - 1 x daily - 7 x weekly - 3 sets - 10 reps - Supine 90/90 Alternating Heel Touches with Posterior Pelvic Tilt  - 1 x daily - 7 x weekly - 2 sets - 10 reps - Supine Dead Bug with Leg  Extension  - 1 x daily - 7 x weekly - 2 sets - 10 reps - Supine Hip Internal and External Rotation  - 1 x daily - 7 x weekly - 1 sets - 5 reps - 10 sec hold - Supine ITB Stretch with Strap  - 1 x daily - 7 x weekly - 1 sets - 3 reps - 20 hold - Hooklying Clamshell with Resistance  - 1 x daily - 7 x weekly - 3 sets - 10 reps - Clamshell with Resistance  - 1 x daily - 7 x weekly - 3 sets - 10 reps - Standing Hamstring Stretch with Step  - 1 x daily - 7 x weekly - 1 sets - 10 reps - Standing Knee Flexion Stretch on Step  - 1 x daily - 7 x weekly - 1 sets - 10 reps - Standing Hip Hinge with Dowel  - 1 x daily - 7 x weekly - 1 sets - 10 reps - Sit to Stand  - 1 x daily - 7 x weekly - 1 sets - 8 reps ASSESSMENT:  CLINICAL IMPRESSION: Ashia seems to have responded somewhat to [facility].  She was still fairly sore for having had the injection almost one week ago.  She was able to complete all tasks without increased pain.  She demonstrates good core control on all  exercises.  She did fatigue slightly on SLR with shoulder extension but completed all reps without rest.  She would benefit from continuing skilled PT for LE flexibility and core stabilization.   OBJECTIVE IMPAIRMENTS: difficulty walking, decreased ROM, decreased strength, impaired flexibility, postural dysfunction, and pain.   ACTIVITY LIMITATIONS: carrying, lifting, bending, sitting, standing, squatting, sleeping, and bed mobility  PARTICIPATION LIMITATIONS: meal prep, cleaning, laundry, driving, shopping, community activity, and yard work  PERSONAL FACTORS: Past/current experiences and 1-2 comorbidities: OA, left great toe pain  are also affecting patient's functional outcome.   REHAB POTENTIAL: Good  CLINICAL DECISION MAKING: Stable/uncomplicated  EVALUATION COMPLEXITY: Low   GOALS: Goals reviewed with patient? Yes  SHORT TERM GOALS: Target date: 10/07/2022   Patient will be independent with initial HEP  Baseline: Goal status:  INITIAL  2.  Pain report to be no greater than 4/10  Baseline:  Goal status: INITIAL   LONG TERM GOALS: Target date: 11/04/2022   Patient to be independent with advanced HEP  Baseline:  Goal status: INITIAL  2.  Patient to report pain no greater than 2/10  Baseline:  Goal status: INITIAL  3.  Patient to be able to stand or walk for at least 15 min without leg pain or  Baseline:  Goal status: INITIAL  4.  Patient to be able to sleep through the night  Baseline:  Goal status: INITIAL  5.  Patient to report 85% improvement in overall symptoms  Baseline:  Goal status: INITIAL  6.  FOTO score to reach predicted goal Baseline:  Goal status: INITIAL  PLAN:  PT FREQUENCY: 1-2x/week  PT DURATION: 8 weeks  PLANNED INTERVENTIONS: Therapeutic exercises, Therapeutic activity, Neuromuscular re-education, Balance training, Gait training, Patient/Family education, Self Care, Joint mobilization, Aquatic Therapy, Dry Needling, Electrical stimulation, Spinal mobilization, Cryotherapy, Moist heat, Taping, Traction, Ultrasound, Manual therapy, and Re-evaluation.  PLAN FOR NEXT SESSION: Nustep,  HEP progression, resume core strengthening from previous episode.  Pt having ESI on 8/15  Adron Geisel B. Justino Boze, PT 09/23/22 12:28 PM [facility] [address] [place], [place] [address] Phone # [phone] Fax [phone]

## 2022-09-24 ENCOUNTER — Ambulatory Visit: Payer: Medicare Other | Admitting: Physical Therapy

## 2022-09-28 ENCOUNTER — Other Ambulatory Visit: Payer: Self-pay

## 2022-09-28 ENCOUNTER — Ambulatory Visit: Payer: Medicare Other

## 2022-09-28 DIAGNOSIS — M5459 Other low back pain: Secondary | ICD-10-CM | POA: Diagnosis not present

## 2022-09-28 DIAGNOSIS — M6281 Muscle weakness (generalized): Secondary | ICD-10-CM | POA: Diagnosis not present

## 2022-09-28 DIAGNOSIS — R262 Difficulty in walking, not elsewhere classified: Secondary | ICD-10-CM | POA: Diagnosis not present

## 2022-09-28 DIAGNOSIS — R279 Unspecified lack of coordination: Secondary | ICD-10-CM

## 2022-09-28 DIAGNOSIS — M62838 Other muscle spasm: Secondary | ICD-10-CM

## 2022-09-28 DIAGNOSIS — R102 Pelvic and perineal pain: Secondary | ICD-10-CM

## 2022-09-28 NOTE — Therapy (Signed)
OUTPATIENT PHYSICAL THERAPY FEMALE PELVIC EVALUATION   Patient Name: Michelle Gray MRN: [id number] DOB:10/12/1955, 67 y.o., female Today's Date: 09/28/2022  END OF SESSION:  PT End of Session - 09/28/22 1534     Visit Number 4   PF eval   Date for PT Re-Evaluation 11/23/22    Authorization Type Aetna - Medicare    PT Start Time 1400    PT Stop Time 1440    PT Time Calculation (min) 40 min    Activity Tolerance Patient tolerated treatment well    Behavior During Therapy [facility] for tasks assessed/performed             Past Medical History:  Diagnosis Date   Arthritis    Asthma    Cancer (HCC) 1990   right breast ca-mast with reconstr   Cancer [facility]) 12/2020   left breast DCIS   Eczema    dx by derm per patient   Fibromyalgia    Hyperlipidemia    Hypothyroidism    Neuromuscular disorder (HCC)    Osteoporosis    PONV (postoperative nausea and vomiting)    PVC (premature ventricular contraction)    Thyroid disease    Past Surgical History:  Procedure Laterality Date   APPENDECTOMY     BREAST IMPLANT REMOVAL Right 02/11/2021   Procedure: REMOVAL RIGHT BREAST IMPLANT;  Surgeon: Glenna Fellows, MD;  Location: [facility];  Service: Plastics;  Laterality: Right;   BREAST SURGERY Right 1990   Mastectomy   CAPSULECTOMY Right 02/11/2021   Procedure: CAPSULECTOMY;  Surgeon: Glenna Fellows, MD;  Location: [facility];  Service: Plastics;  Laterality: Right;   FOOT SURGERY     LAPROSCOPIC     TOTAL MASTECTOMY Left 02/11/2021   Procedure: LEFT TOTAL MASTECTOMY;  Surgeon: Emelia Loron, MD;  Location: [facility];  Service: General;  Laterality: Left;   WISDOM TEETH REMOVAL     Patient Active Problem List   Diagnosis Date Noted   Breast cancer, left breast (HCC) 02/11/2021   Cough variant asthma with ? component upper airway cough syndrome 12/23/2018   Primary osteoarthritis of both hands 02/21/2016   Fibromyalgia  02/21/2016   History of hypothyroidism 02/21/2016   Dyslipidemia 02/21/2016   History of breast cancer 02/21/2016   Age-related osteoporosis without current pathological fracture 02/21/2016   Unspecified hypothyroidism 08/06/2013   Other and unspecified hyperlipidemia 08/06/2013   Insomnia 08/06/2013   Intrinsic asthma 08/06/2013   Hip pain, left 01/12/2011   Plantar fasciitis 01/12/2011    PCP: Gweneth Dimitri, MD  REFERRING PROVIDER: Shea Evans, MD  REFERRING DIAG: R29.898 (ICD-10-CM) - Other symptoms and signs involving the musculoskeletal system  THERAPY DIAG:  Pelvic pain  Muscle weakness (generalized)  Other muscle spasm  Unspecified lack of coordination  Rationale for Evaluation and Treatment: Rehabilitation  ONSET DATE: 6 months   SUBJECTIVE:  SUBJECTIVE STATEMENT: Pt states that she has been doing well for several years, but now that she is retired she has been sitting much more and feel like this has aggravated her LT hip/pelvic pain. She is also having more low back pain. Pain starts down Lt side of sacrum to tailbone and warps around front into groin. She started using her pelvic floor wand again and states that this has been very helpful and she has noticed great improvements in pain and pelvic floor tension. She is in PT for low back pain at this facility. She had MRI on low back and states that she has several herniated discs. She is having Lt toe fusion in several weeks and will be in a boot for 6 weeks.  Fluid intake: Yes: water and decaf coffee    PAIN:  Are you having pain? Yes NPRS scale: 0/10 currently, 12/10 at worst Pain location:  pelvic pain, Lt side, wraps around into groin and Lt side of sacrum   Pain type: aching and burning Pain description: intermittent    Aggravating factors: sitting, later in the day Relieving factors: stretches (pelvic tilts, hamstring stretches), ice  PRECAUTIONS: None  RED FLAGS: None   WEIGHT BEARING RESTRICTIONS: No  FALLS:  Has patient fallen in last 6 months? No  LIVING ENVIRONMENT: Lives with: lives with their family Lives in: House/apartment   OCCUPATION: retired  PLOF: Independent  PATIENT GOALS: decrease Lt hip/pelvic pain  PERTINENT HISTORY:  Appendectomy, double mastectomy/breast cancer  BOWEL MOVEMENT: Pain with bowel movement: No Type of bowel movement:Frequency 1x/day and Strain No Fully empty rectum: Yes: - Leakage: No Pads: No Fiber supplement: No  URINATION: Pain with urination: No Fully empty bladder: Yes: - Stream: Strong Urgency: Yes: occasional Frequency: unsure daily, 1-2x/night Leakage:  none Pads: No  INTERCOURSE: Pain with intercourse:  No pain Ability to have vaginal penetration:  Yes: - Climax: WNL Marinoff Scale: 0/3  PREGNANCY: None  PROLAPSE: none   OBJECTIVE:  09/28/22: COGNITION: Overall cognitive status: Within functional limits for tasks assessed     SENSATION: Light touch: Appears intact Proprioception: Appears intact  FUNCTIONAL TESTS:  Squat:Rt valgus knee collapse, anterior weight shift Single leg stance: Lt, pelvic collapse on Rt, 5 seconds; Rt stable pelvis >10 seconds  GAIT: Comments: WNL  POSTURE: rounded shoulders, forward head, decreased lumbar lordosis, decreased thoracic kyphosis, anterior pelvic tilt, and Lt lower thoracolumbar scoliosis  LUMBARAROM/PROM:  A/PROM A/PROM  Eval (% available)  Flexion 75  Extension 100  Right lateral flexion 50  Left lateral flexion 50  Right rotation 50  Left rotation 50   (Blank rows = not tested)  PALPATION:   General  significant Lt chest wall scar tissue that is limiting rib cage mobility; decreased bil lateral rib cage excursion, generalized abdominal tightness likely due to  bracing form low back pain and fear of movement                External Perineal Exam WNL                             Internal Pelvic Floor some burning at posterior fourchette; some mild tenderness and increased tension in Lt levator ani; mild atrophy in Rt levator ani  Patient confirms identification and approves PT to assess internal pelvic floor and treatment Yes  PELVIC MMT:   MMT eval  Vaginal 3/5, 2 second endurance, 6 repeat contractions with decreasing coordination  Internal  Anal Sphincter   External Anal Sphincter   Puborectalis   Diastasis Recti WNL  (Blank rows = not tested)        TONE: Slight increase in Rt levator ani  PROLAPSE: WNL  TODAY'S TREATMENT:                                                                                                                              DATE:  09/28/22  EVAL  Neuromuscular re-education: Diaphragmatic breathing - beach ball Transversus abdominus training with multimodal cues for improved motor control and breath coordination Pelvic floor contraction training    PATIENT EDUCATION:  Education details: See above Person educated: Patient Education method: Explanation, Demonstration, Tactile cues, Verbal cues, and Handouts Education comprehension: verbalized understanding  HOME EXERCISE PROGRAM: Written handout  ASSESSMENT:  CLINICAL IMPRESSION: Patient is a 67 y.o. female who was seen today for physical therapy evaluation and treatment for Lt sided pelvic/low back pain. Exam findings notable for abnormal posture, decreased functoinal ability in single leg stance and squatting due to core weakness, decreased lumbar A/ROM, muscle spasm in lumbar paraspinals, significant abdominal tightness/guarding, decreased lateral rib cage mobility, significant scar tissue restriction in Lt chest wall, pelvic floor muscle weakness 3/5, decreased pelvic floor muscle endurance 2 seconds, poor pelvic floor coordination with decreased  ability to perform repeated contractions, and mild increase in Lt sided levator ani tone. Signs and symptoms are most consistent with Lt chest wall scar tissue, abdominal guarding/tightness, and deep core weakness (including pelvic floor) that are placing increased pressure on low back and low lumbopelvic support. Initial treatment consisted of diaphragmatic breathing training, pelvic floor contraction training, and transversus abdominus training; she did well with all activities. She will continue to benefit from skilled PT intervention in order to improve pain, decrease abdominal and chest scar tissue restriction, and begin/progress functional strengthening program.   OBJECTIVE IMPAIRMENTS: decreased activity tolerance, decreased coordination, decreased endurance, decreased strength, increased fascial restrictions, increased muscle spasms, impaired tone, postural dysfunction, and pain.   ACTIVITY LIMITATIONS: bending, sitting, squatting, and locomotion level  PARTICIPATION LIMITATIONS: community activity  PERSONAL FACTORS: 1 comorbidity: medical history   are also affecting patient's functional outcome.   REHAB POTENTIAL: Good  CLINICAL DECISION MAKING: Stable/uncomplicated  EVALUATION COMPLEXITY: Low   GOALS: Goals reviewed with patient? Yes  SHORT TERM GOALS: Target date: 10/26/22  Pt will be independent with HEP.   Baseline: Goal status: INITIAL  2.  Pt will be independent with diaphragmatic breathing and down training activities in order to improve pelvic floor/abdominal relaxation.  Baseline:  Goal status: INITIAL  3.  Pt will be independent with chest wall scar tissue mobilization techniques and perform at least 2x/week.  Baseline:  Goal status: INITIAL   LONG TERM GOALS: Target date: 11/23/22  Pt will be independent with advanced HEP.   Baseline:  Goal status: INITIAL  2.  Pt will demonstrate normal pelvic floor muscle tone and A/ROM, able to achieve  4/5 strength  with contractions and 10 sec endurance, in order to provide appropriate lumbopelvic support in functional activities.   Baseline:  Goal status: INITIAL  3.  Pt will report no Lt pelvic pain higher than 3/5.  Baseline:  Goal status: INITIAL  4.  Pt will be able to sit for longer than 60 minutes without increase in pelvic pain. Baseline:  Goal status: INITIAL  5.  Pt will be able to perform single leg stance >20 seconds bil without pelvic drop.  Baseline:  Goal status: INITIAL  6.  Pt will increase all impaired lumbar A/ROM by 25% without pain.  Baseline:  Goal status: INITIAL  PLAN:  PT FREQUENCY: 1-2x/week  PT DURATION: 8 weeks  PLANNED INTERVENTIONS: Therapeutic exercises, Therapeutic activity, Neuromuscular re-education, Balance training, Gait training, Patient/Family education, Self Care, Joint mobilization, Dry Needling, Biofeedback, and Manual therapy  PLAN FOR NEXT SESSION: Continue core training; mobility exercises for pelvic floor; possible dry needling/myofascial release to Lt pelvic girdle (quadruped release).    Julio Alm, PT, DPT08/26/243:56 PM

## 2022-09-30 ENCOUNTER — Ambulatory Visit: Payer: Medicare Other

## 2022-09-30 DIAGNOSIS — M5459 Other low back pain: Secondary | ICD-10-CM

## 2022-09-30 DIAGNOSIS — R262 Difficulty in walking, not elsewhere classified: Secondary | ICD-10-CM

## 2022-09-30 DIAGNOSIS — R252 Cramp and spasm: Secondary | ICD-10-CM

## 2022-09-30 DIAGNOSIS — M25552 Pain in left hip: Secondary | ICD-10-CM

## 2022-09-30 DIAGNOSIS — R102 Pelvic and perineal pain: Secondary | ICD-10-CM

## 2022-09-30 DIAGNOSIS — M6281 Muscle weakness (generalized): Secondary | ICD-10-CM | POA: Diagnosis not present

## 2022-09-30 NOTE — Therapy (Signed)
OUTPATIENT PHYSICAL THERAPY THORACOLUMBAR PROGRESS NOTE   Patient Name: Michelle Gray MRN: [id number] DOB:01/13/1956, 67 y.o., female Today's Date: 09/30/2022  END OF SESSION:  PT End of Session - 09/30/22 1022     Visit Number 5    Date for PT Re-Evaluation 11/23/22    Authorization Type Aetna - Medicare    PT Start Time 1018    PT Stop Time 1100    PT Time Calculation (min) 42 min    Activity Tolerance Patient tolerated treatment well    Behavior During Therapy [facility] for tasks assessed/performed             Past Medical History:  Diagnosis Date   Arthritis    Asthma    Cancer (HCC) 1990   right breast ca-mast with reconstr   Cancer (HCC) 12/2020   left breast DCIS   Eczema    dx by derm per patient   Fibromyalgia    Hyperlipidemia    Hypothyroidism    Neuromuscular disorder (HCC)    Osteoporosis    PONV (postoperative nausea and vomiting)    PVC (premature ventricular contraction)    Thyroid disease    Past Surgical History:  Procedure Laterality Date   APPENDECTOMY     BREAST IMPLANT REMOVAL Right 02/11/2021   Procedure: REMOVAL RIGHT BREAST IMPLANT;  Surgeon: Glenna Fellows, MD;  Location: [facility];  Service: Plastics;  Laterality: Right;   BREAST SURGERY Right 1990   Mastectomy   CAPSULECTOMY Right 02/11/2021   Procedure: CAPSULECTOMY;  Surgeon: Glenna Fellows, MD;  Location: [facility];  Service: Plastics;  Laterality: Right;   FOOT SURGERY     LAPROSCOPIC     TOTAL MASTECTOMY Left 02/11/2021   Procedure: LEFT TOTAL MASTECTOMY;  Surgeon: Emelia Loron, MD;  Location: [facility];  Service: General;  Laterality: Left;   WISDOM TEETH REMOVAL     Patient Active Problem List   Diagnosis Date Noted   Breast cancer, left breast (HCC) 02/11/2021   Cough variant asthma with ? component upper airway cough syndrome 12/23/2018   Primary osteoarthritis of both hands 02/21/2016   Fibromyalgia 02/21/2016    History of hypothyroidism 02/21/2016   Dyslipidemia 02/21/2016   History of breast cancer 02/21/2016   Age-related osteoporosis without current pathological fracture 02/21/2016   Unspecified hypothyroidism 08/06/2013   Other and unspecified hyperlipidemia 08/06/2013   Insomnia 08/06/2013   Intrinsic asthma 08/06/2013   Hip pain, left 01/12/2011   Plantar fasciitis 01/12/2011    PCP: Pollyann Savoy, MD   REFERRING PROVIDER: Jene Every, MD  REFERRING DIAG: M54.59 (ICD-10-CM) - Other low back pain  Rationale for Evaluation and Treatment: Rehabilitation  THERAPY DIAG:  Pelvic pain  Muscle weakness (generalized)  Cramp and spasm  Difficulty in walking, not elsewhere classified  Pain in left hip  Other low back pain  ONSET DATE: 08/24/2022  SUBJECTIVE:  SUBJECTIVE STATEMENT: Patient reports she is feeling a little better today.  She rates her pain at 1/10.     PERTINENT HISTORY:  Recent   PAIN:  09/30/22 Are you having pain?  Yes, varies throughout the day.  1/10  PRECAUTIONS: None  RED FLAGS: None   WEIGHT BEARING RESTRICTIONS: No  FALLS:  Has patient fallen in last 6 months? Yes. Number of falls tripped over folded up mat going out of a door   OCCUPATION: Retired Publishing rights manager  PLOF: Independent, Independent with basic ADLs, Independent with household mobility without device, Independent with community mobility without device, Independent with homemaking with ambulation, Independent with gait, and Independent with transfers  PATIENT GOALS: She hopes to get updated instruction and guidance on how to resume her HEP safely and to gain control of her low back pain again to be able to resume prior level of function.  NEXT MD VISIT: prn  OBJECTIVE:   DIAGNOSTIC  FINDINGS:  MRI recent:  shows progression of previous issues  PATIENT SURVEYS:  FOTO 57, predicted 67  SCREENING FOR RED FLAGS: Bowel or bladder incontinence: No Spinal tumors: No Cauda equina syndrome: No Compression fracture: No Abdominal aneurysm: No  COGNITION: Overall cognitive status: Within functional limits for tasks assessed     SENSATION: WFL  MUSCLE LENGTH: Patient has continued restriction in bilateral hamstrings to approx 60 degrees on left and 50 degrees on right, positive Thomas test bilaterally  POSTURE:  Obvious acquired scoliosis on flexion    LUMBAR ROM:   AROM eval  Flexion Fingertips to ankles  Extension WNL  Right lateral flexion Fingertips to just above joint line  Left lateral flexion Fingertips to just above joint line  Right rotation WNL  Left rotation WNL   (Blank rows = not tested)  LOWER EXTREMITY ROM:     WFL  LOWER EXTREMITY MMT:    No significant issues or myotomal weaknesses  LUMBAR SPECIAL TESTS:  Thomas test: Positive  FUNCTIONAL TESTS:  5 times sit to stand: 12.13 sec Timed up and go (TUG): 6.38  GAIT: Distance walked: 30 Assistive device utilized: None Level of assistance: Complete Independence Comments: guarded/antalgic  TODAY'S TREATMENT:     DATE: 09/30/22 Nu-step L5 5 min while discussing status Standing hamstring stretch 3 x 30 sec each LE at barre with foot on stacked 8 and 6 inch steps Same step height pelvic tilt posterior first then hip flexor stretch with UE raise and reach over dynamically 10x right/left Supine pelvic tilt x 20 PPT with 90/90 heel taps x 20 PPT with dying but x 20 PPT with knee and hip flexion/extension 2 x 10 each LE Seated piriformis stretch 3 x 30 sec both Hooklying trunk rotation x 20 Completed functional tests: see above Standing hip matrix 40 lbs hip abduction and extension 2 x 10 each   DATE: 09/23/22 Nu-step L5 5 min while discussing status Standing hamstring stretch 3 x 30  sec each LE at barre with foot on stacked 8 and 6 inch steps Same step height pelvic tilt posterior first then hip flexor stretch with UE raise and reach over dynamically 10x right/left Seated piriformis stretch 3 x 30 sec both Supine pelvic tilt x 20 PPT with 90/90 heel taps x 20 PPT with dying but x 20 PPT with knee and hip flexion/extension 2 x 10 each LE PPT with SLR to shoulder extension with 4 lb dumbbell 2 x 10 each  Hooklying trunk rotation x 20  DATE: 09/15/22 Nu-step  L5 5 min while discussing status 2nd step HS stretch dynamically 10x right/left 2nd step pelvic tilt posterior first then hip flexor stretch with UE raise and reach over dynamically 10x right/left Review of seated piriformis stretch (long term restriction left hip external rotation) Supine piriformis stretch; used blue ball to assist Supine pelvic tilt (discussed just focusing on posterior direction since that feels the best) Supine heel taps 10x SLS with activation of glutes to "stand tall" on left Hip hinge with golf club maintaining 3 points of contact 2x 10; discussed functional use of the hip hinge for yard work, Public affairs consultant, picking up heavier things where 2 hands are needed (she uses golfer's lift for small objects) Sit to stand holding 2 5# dumbbells at shoulders 8x (challenged by this)                                                                                                                        DATE: 09/09/22 Initial eval completed and initiated HEP    PATIENT EDUCATION:  Education details: Initiated HEP Person educated: Patient Education method: Programmer, multimedia, Facilities manager, Verbal cues, and Handouts Education comprehension: verbalized understanding, returned demonstration, and verbal cues required  HOME EXERCISE PROGRAM: Access Code: [email or web address] URL: [email or web address] Date: 09/30/2022 Prepared by: Mikey Kirschner  Exercises - Trunk Sidebending with Resistance  - 1 x daily - 7 x  weekly - 1 sets - 5 reps - 10 hold - Supine Hip Internal and External Rotation  - 1 x daily - 7 x weekly - 3 sets - 10 reps - Standing Hamstring Stretch on Chair  - 1 x daily - 7 x weekly - 1 sets - 3 reps - 30 sec hold - Quadriceps Stretch with Table  - 1 x daily - 7 x weekly - 1 sets - 10 reps - Seated Figure 4 Piriformis Stretch  - 1 x daily - 7 x weekly - 1 sets - 3 reps - 30 sec hold - Shoulder extension with resistance - Neutral  - 1 x daily - 7 x weekly - 2 sets - 10 reps - Standing Shoulder Row with Anchored Resistance  - 1 x daily - 7 x weekly - 2 sets - 10 reps - Shoulder External Rotation and Scapular Retraction with Resistance  - 1 x daily - 7 x weekly - 2 sets - 10 reps - Supine Posterior Pelvic Tilt  - 1 x daily - 7 x weekly - 1 sets - 20 reps - Supine 90/90 Alternating Heel Touches with Posterior Pelvic Tilt  - 1 x daily - 7 x weekly - 1 sets - 20 reps - Supine Dead Bug with Leg Extension  - 1 x daily - 7 x weekly - 1 sets - 20 reps - Supine 90/90 leg extensions  - 1 x daily - 7 x weekly - 1 sets - 20 reps - Supine Lower Trunk Rotation  - 1 x daily - 7 x weekly - 1 sets - 20 reps  ASSESSMENT:  CLINICAL IMPRESSION: Citlalli did very well today.  She seems to have gotten some response to the [facility].  We added hip strengthening due to her continued weakness in the left hip.  This is primarily glut medius weakness that she has had for quite some time but just needed reminders on importance of strengthening her hips.  She fatigued easily with resuming some of these exercises.  Overall, she did very well with all tasks today.   She would benefit from continuing skilled PT for LE flexibility and core stabilization.   OBJECTIVE IMPAIRMENTS: difficulty walking, decreased ROM, decreased strength, impaired flexibility, postural dysfunction, and pain.   ACTIVITY LIMITATIONS: carrying, lifting, bending, sitting, standing, squatting, sleeping, and bed mobility  PARTICIPATION LIMITATIONS: meal  prep, cleaning, laundry, driving, shopping, community activity, and yard work  PERSONAL FACTORS: Past/current experiences and 1-2 comorbidities: OA, left great toe pain  are also affecting patient's functional outcome.   REHAB POTENTIAL: Good  CLINICAL DECISION MAKING: Stable/uncomplicated  EVALUATION COMPLEXITY: Low   GOALS: Goals reviewed with patient? Yes  SHORT TERM GOALS: Target date: 10/07/2022   Patient will be independent with initial HEP  Baseline: Goal status: MET  2.  Pain report to be no greater than 4/10  Baseline:  Goal status: INITIAL   LONG TERM GOALS: Target date: 11/04/2022   Patient to be independent with advanced HEP  Baseline:  Goal status: INITIAL  2.  Patient to report pain no greater than 2/10  Baseline:  Goal status: INITIAL  3.  Patient to be able to stand or walk for at least 15 min without leg pain or  Baseline:  Goal status: INITIAL  4.  Patient to be able to sleep through the night  Baseline:  Goal status: INITIAL  5.  Patient to report 85% improvement in overall symptoms  Baseline:  Goal status: INITIAL  6.  FOTO score to reach predicted goal Baseline:  Goal status: INITIAL  PLAN:  PT FREQUENCY: 1-2x/week  PT DURATION: 8 weeks  PLANNED INTERVENTIONS: Therapeutic exercises, Therapeutic activity, Neuromuscular re-education, Balance training, Gait training, Patient/Family education, Self Care, Joint mobilization, Aquatic Therapy, Dry Needling, Electrical stimulation, Spinal mobilization, Cryotherapy, Moist heat, Taping, Traction, Ultrasound, Manual therapy, and Re-evaluation.  PLAN FOR NEXT SESSION: Nustep, revisit hip strengthening, resume core strengthening from previous episode.    Victorino Dike B. [place] Millon, PT 09/30/22 11:49 AM [facility] [address], [address] [place], [place] [address] Phone # [phone] Fax [phone]

## 2022-10-02 ENCOUNTER — Ambulatory Visit: Payer: Medicare Other

## 2022-10-02 DIAGNOSIS — M25552 Pain in left hip: Secondary | ICD-10-CM

## 2022-10-02 DIAGNOSIS — R252 Cramp and spasm: Secondary | ICD-10-CM

## 2022-10-02 DIAGNOSIS — M6281 Muscle weakness (generalized): Secondary | ICD-10-CM

## 2022-10-02 DIAGNOSIS — M5459 Other low back pain: Secondary | ICD-10-CM | POA: Diagnosis not present

## 2022-10-02 DIAGNOSIS — R262 Difficulty in walking, not elsewhere classified: Secondary | ICD-10-CM | POA: Diagnosis not present

## 2022-10-02 NOTE — Therapy (Signed)
OUTPATIENT PHYSICAL THERAPY THORACOLUMBAR PROGRESS NOTE   Patient Name: Michelle Gray MRN: [id number] DOB:[birth year], 67 y.o., female Today's Date: 10/02/2022  END OF SESSION:  PT End of Session - 10/02/22 0937     Visit Number 6    Date for PT Re-Evaluation 11/23/22    Authorization Type Aetna - Medicare    PT Start Time [phone]    PT Stop Time 1014    PT Time Calculation (min) 41 min    Activity Tolerance Patient tolerated treatment well    Behavior During Therapy [facility] for tasks assessed/performed             Past Medical History:  Diagnosis Date   Arthritis    Asthma    Cancer (HCC) 1990   right breast ca-mast with reconstr   Cancer [facility]) 12/2020   left breast DCIS   Eczema    dx by derm per patient   Fibromyalgia    Hyperlipidemia    Hypothyroidism    Neuromuscular disorder (HCC)    Osteoporosis    PONV (postoperative nausea and vomiting)    PVC (premature ventricular contraction)    Thyroid disease    Past Surgical History:  Procedure Laterality Date   APPENDECTOMY     BREAST IMPLANT REMOVAL Right 02/11/2021   Procedure: REMOVAL RIGHT BREAST IMPLANT;  Surgeon: Glenna Fellows, MD;  Location: [facility];  Service: Plastics;  Laterality: Right;   BREAST SURGERY Right 1990   Mastectomy   CAPSULECTOMY Right 02/11/2021   Procedure: CAPSULECTOMY;  Surgeon: Glenna Fellows, MD;  Location: [facility];  Service: Plastics;  Laterality: Right;   FOOT SURGERY     LAPROSCOPIC     TOTAL MASTECTOMY Left 02/11/2021   Procedure: LEFT TOTAL MASTECTOMY;  Surgeon: Emelia Loron, MD;  Location: [facility];  Service: General;  Laterality: Left;   WISDOM TEETH REMOVAL     Patient Active Problem List   Diagnosis Date Noted   Breast cancer, left breast (HCC) 02/11/2021   Cough variant asthma with ? component upper airway cough syndrome 12/23/2018   Primary osteoarthritis of both hands 02/21/2016   Fibromyalgia 02/21/2016    History of hypothyroidism 02/21/2016   Dyslipidemia 02/21/2016   History of breast cancer 02/21/2016   Age-related osteoporosis without current pathological fracture 02/21/2016   Unspecified hypothyroidism 08/06/2013   Other and unspecified hyperlipidemia 08/06/2013   Insomnia 08/06/2013   Intrinsic asthma 08/06/2013   Hip pain, left 01/12/2011   Plantar fasciitis 01/12/2011    PCP: Pollyann Savoy, MD   REFERRING PROVIDER: Jene Every, MD  REFERRING DIAG: M54.59 (ICD-10-CM) - Other low back pain  Rationale for Evaluation and Treatment: Rehabilitation  THERAPY DIAG:  Pain in left hip  Muscle weakness (generalized)  Cramp and spasm  Other low back pain  ONSET DATE: 08/24/2022  SUBJECTIVE:  SUBJECTIVE STATEMENT: Patient reports she felt a little sore but "I really think that hip machine did me a lot of good"      PERTINENT HISTORY:  Recent   PAIN:  10/02/22 Are you having pain?  Yes, varies throughout the day.  1/10  PRECAUTIONS: None  RED FLAGS: None   WEIGHT BEARING RESTRICTIONS: No  FALLS:  Has patient fallen in last 6 months? Yes. Number of falls tripped over folded up mat going out of a door   OCCUPATION: Retired Publishing rights manager  PLOF: Independent, Independent with basic ADLs, Independent with household mobility without device, Independent with community mobility without device, Independent with homemaking with ambulation, Independent with gait, and Independent with transfers  PATIENT GOALS: She hopes to get updated instruction and guidance on how to resume her HEP safely and to gain control of her low back pain again to be able to resume prior level of function.  NEXT MD VISIT: prn  OBJECTIVE:   DIAGNOSTIC FINDINGS:  MRI recent:  shows progression of previous  issues  PATIENT SURVEYS:  FOTO 57, predicted 61  SCREENING FOR RED FLAGS: Bowel or bladder incontinence: No Spinal tumors: No Cauda equina syndrome: No Compression fracture: No Abdominal aneurysm: No  COGNITION: Overall cognitive status: Within functional limits for tasks assessed     SENSATION: WFL  MUSCLE LENGTH: Patient has continued restriction in bilateral hamstrings to approx 60 degrees on left and 50 degrees on right, positive Thomas test bilaterally  POSTURE:  Obvious acquired scoliosis on flexion    LUMBAR ROM:   AROM eval  Flexion Fingertips to ankles  Extension WNL  Right lateral flexion Fingertips to just above joint line  Left lateral flexion Fingertips to just above joint line  Right rotation WNL  Left rotation WNL   (Blank rows = not tested)  LOWER EXTREMITY ROM:     WFL  LOWER EXTREMITY MMT:    No significant issues or myotomal weaknesses  LUMBAR SPECIAL TESTS:  Thomas test: Positive  FUNCTIONAL TESTS:  5 times sit to stand: 12.13 sec Timed up and go (TUG): 6.38  GAIT: Distance walked: 30 Assistive device utilized: None Level of assistance: Complete Independence Comments: guarded/antalgic  TODAY'S TREATMENT:     DATE: 10/02/22 Nu-step L5 5 min while discussing status Lateral band walks x 3 laps of 15 feet with blue loop Seated clam x 20 with blue loop  Supine clam x 20 with blue loop Side lying clam 2 x 10 with blue loop Side lying reverse clam 2 x 10 with blue loop Standing hip matrix 40 lbs hip abduction and extension 2 x 10 each Sit to stand x 10 Squat to table x 10 Supine pelvic tilt x 20 PPT with 90/90 heel taps x 20 PPT with dying bug x 20 PPT with knee and hip flexion/extension 2 x 10 each LE Hooklying trunk rotation x 20   DATE: 09/30/22 Nu-step L5 5 min while discussing status Standing hamstring stretch 3 x 30 sec each LE at barre with foot on stacked 8 and 6 inch steps Same step height pelvic tilt posterior first  then hip flexor stretch with UE raise and reach over dynamically 10x right/left Supine pelvic tilt x 20 PPT with 90/90 heel taps x 20 PPT with dying but x 20 PPT with knee and hip flexion/extension 2 x 10 each LE Seated piriformis stretch 3 x 30 sec both Hooklying trunk rotation x 20 Completed functional tests: see above Standing hip matrix 40 lbs hip  abduction and extension 2 x 10 each   DATE: 09/23/22 Nu-step L5 5 min while discussing status Standing hamstring stretch 3 x 30 sec each LE at barre with foot on stacked 8 and 6 inch steps Same step height pelvic tilt posterior first then hip flexor stretch with UE raise and reach over dynamically 10x right/left Seated piriformis stretch 3 x 30 sec both Supine pelvic tilt x 20 PPT with 90/90 heel taps x 20 PPT with dying but x 20 PPT with knee and hip flexion/extension 2 x 10 each LE PPT with SLR to shoulder extension with 4 lb dumbbell 2 x 10 each  Hooklying trunk rotation x 20                                                                      DATE: 09/09/22 Initial eval completed and initiated HEP    PATIENT EDUCATION:  Education details: Initiated HEP Person educated: Patient Education method: Programmer, multimedia, Facilities manager, Verbal cues, and Handouts Education comprehension: verbalized understanding, returned demonstration, and verbal cues required  HOME EXERCISE PROGRAM: Access Code: [email or web address] URL: [email or web address] Date: 09/30/2022 Prepared by: Mikey Kirschner  Exercises - Trunk Sidebending with Resistance  - 1 x daily - 7 x weekly - 1 sets - 5 reps - 10 hold - Supine Hip Internal and External Rotation  - 1 x daily - 7 x weekly - 3 sets - 10 reps - Standing Hamstring Stretch on Chair  - 1 x daily - 7 x weekly - 1 sets - 3 reps - 30 sec hold - Quadriceps Stretch with Table  - 1 x daily - 7 x weekly - 1 sets - 10 reps - Seated Figure 4 Piriformis Stretch  - 1 x daily - 7 x weekly - 1 sets - 3 reps - 30 sec  hold - Shoulder extension with resistance - Neutral  - 1 x daily - 7 x weekly - 2 sets - 10 reps - Standing Shoulder Row with Anchored Resistance  - 1 x daily - 7 x weekly - 2 sets - 10 reps - Shoulder External Rotation and Scapular Retraction with Resistance  - 1 x daily - 7 x weekly - 2 sets - 10 reps - Supine Posterior Pelvic Tilt  - 1 x daily - 7 x weekly - 1 sets - 20 reps - Supine 90/90 Alternating Heel Touches with Posterior Pelvic Tilt  - 1 x daily - 7 x weekly - 1 sets - 20 reps - Supine Dead Bug with Leg Extension  - 1 x daily - 7 x weekly - 1 sets - 20 reps - Supine 90/90 leg extensions  - 1 x daily - 7 x weekly - 1 sets - 20 reps - Supine Lower Trunk Rotation  - 1 x daily - 7 x weekly - 1 sets - 20 reps  ASSESSMENT:  CLINICAL IMPRESSION: Shakala is progressing appropriately.  We re-visited the hip matrix and she was able to do the same weight without fatigue. She is compliant and well motivated.   She would benefit from continuing skilled PT for LE flexibility and core stabilization.   OBJECTIVE IMPAIRMENTS: difficulty walking, decreased ROM, decreased strength, impaired flexibility, postural dysfunction, and pain.   ACTIVITY LIMITATIONS: carrying, lifting,  bending, sitting, standing, squatting, sleeping, and bed mobility  PARTICIPATION LIMITATIONS: meal prep, cleaning, laundry, driving, shopping, community activity, and yard work  PERSONAL FACTORS: Past/current experiences and 1-2 comorbidities: OA, left great toe pain  are also affecting patient's functional outcome.   REHAB POTENTIAL: Good  CLINICAL DECISION MAKING: Stable/uncomplicated  EVALUATION COMPLEXITY: Low   GOALS: Goals reviewed with patient? Yes  SHORT TERM GOALS: Target date: 10/07/2022   Patient will be independent with initial HEP  Baseline: Goal status: MET  2.  Pain report to be no greater than 4/10  Baseline:  Goal status: MET 10/02/22   LONG TERM GOALS: Target date: 11/04/2022   Patient to  be independent with advanced HEP  Baseline:  Goal status: INITIAL  2.  Patient to report pain no greater than 2/10  Baseline:  Goal status: INITIAL  3.  Patient to be able to stand or walk for at least 15 min without leg pain or  Baseline:  Goal status: INITIAL  4.  Patient to be able to sleep through the night  Baseline:  Goal status: INITIAL  5.  Patient to report 85% improvement in overall symptoms  Baseline:  Goal status: INITIAL  6.  FOTO score to reach predicted goal Baseline:  Goal status: INITIAL  PLAN:  PT FREQUENCY: 1-2x/week  PT DURATION: 8 weeks  PLANNED INTERVENTIONS: Therapeutic exercises, Therapeutic activity, Neuromuscular re-education, Balance training, Gait training, Patient/Family education, Self Care, Joint mobilization, Aquatic Therapy, Dry Needling, Electrical stimulation, Spinal mobilization, Cryotherapy, Moist heat, Taping, Traction, Ultrasound, Manual therapy, and Re-evaluation.  PLAN FOR NEXT SESSION: Nustep, revisit hip strengthening, resume core strengthening from previous episode.    Victorino Dike B. Chery Giusto, PT 10/02/22 10:18 AM  [facility] [address], [address] [place], [place] [address] Phone # [phone] Fax [phone]

## 2022-10-06 ENCOUNTER — Encounter: Payer: Self-pay | Admitting: Physical Therapy

## 2022-10-06 ENCOUNTER — Ambulatory Visit: Payer: Worker's Compensation | Attending: Obstetrics & Gynecology | Admitting: Physical Therapy

## 2022-10-06 DIAGNOSIS — R262 Difficulty in walking, not elsewhere classified: Secondary | ICD-10-CM | POA: Insufficient documentation

## 2022-10-06 DIAGNOSIS — M25552 Pain in left hip: Secondary | ICD-10-CM | POA: Insufficient documentation

## 2022-10-06 DIAGNOSIS — R252 Cramp and spasm: Secondary | ICD-10-CM | POA: Insufficient documentation

## 2022-10-06 DIAGNOSIS — M5459 Other low back pain: Secondary | ICD-10-CM | POA: Diagnosis not present

## 2022-10-06 DIAGNOSIS — M6281 Muscle weakness (generalized): Secondary | ICD-10-CM | POA: Diagnosis not present

## 2022-10-06 NOTE — Therapy (Signed)
OUTPATIENT PHYSICAL THERAPY THORACOLUMBAR TREATMENT NOTE   Patient Name: Michelle Gray MRN: [id number] DOB:[birth year], 67 y.o., female Today's Date: 10/06/2022  END OF SESSION:  PT End of Session - 10/06/22 0927     Visit Number 7    Date for PT Re-Evaluation 11/23/22    Authorization Type Aetna - Medicare    PT Start Time [phone]    PT Stop Time 1007    PT Time Calculation (min) 40 min    Activity Tolerance Patient tolerated treatment well    Behavior During Therapy [facility] for tasks assessed/performed              Past Medical History:  Diagnosis Date   Arthritis    Asthma    Cancer (HCC) 1990   right breast ca-mast with reconstr   Cancer [facility]) 12/2020   left breast DCIS   Eczema    dx by derm per patient   Fibromyalgia    Hyperlipidemia    Hypothyroidism    Neuromuscular disorder (HCC)    Osteoporosis    PONV (postoperative nausea and vomiting)    PVC (premature ventricular contraction)    Thyroid disease    Past Surgical History:  Procedure Laterality Date   APPENDECTOMY     BREAST IMPLANT REMOVAL Right 02/11/2021   Procedure: REMOVAL RIGHT BREAST IMPLANT;  Surgeon: Glenna Fellows, MD;  Location: [facility];  Service: Plastics;  Laterality: Right;   BREAST SURGERY Right 1990   Mastectomy   CAPSULECTOMY Right 02/11/2021   Procedure: CAPSULECTOMY;  Surgeon: Glenna Fellows, MD;  Location: [facility];  Service: Plastics;  Laterality: Right;   FOOT SURGERY     LAPROSCOPIC     TOTAL MASTECTOMY Left 02/11/2021   Procedure: LEFT TOTAL MASTECTOMY;  Surgeon: Emelia Loron, MD;  Location: [facility];  Service: General;  Laterality: Left;   WISDOM TEETH REMOVAL     Patient Active Problem List   Diagnosis Date Noted   Breast cancer, left breast (HCC) 02/11/2021   Cough variant asthma with ? component upper airway cough syndrome 12/23/2018   Primary osteoarthritis of both hands 02/21/2016   Fibromyalgia 02/21/2016    History of hypothyroidism 02/21/2016   Dyslipidemia 02/21/2016   History of breast cancer 02/21/2016   Age-related osteoporosis without current pathological fracture 02/21/2016   Unspecified hypothyroidism 08/06/2013   Other and unspecified hyperlipidemia 08/06/2013   Insomnia 08/06/2013   Intrinsic asthma 08/06/2013   Hip pain, left 01/12/2011   Plantar fasciitis 01/12/2011    PCP: Pollyann Savoy, MD   REFERRING PROVIDER: Jene Every, MD  REFERRING DIAG: M54.59 (ICD-10-CM) - Other low back pain  Rationale for Evaluation and Treatment: Rehabilitation  THERAPY DIAG:  Pain in left hip  Muscle weakness (generalized)  Cramp and spasm  Other low back pain  ONSET DATE: 08/24/2022  SUBJECTIVE:  SUBJECTIVE STATEMENT: I am feeling pretty good today.  I am doing my exercises every other day.      PERTINENT HISTORY:  Recent   PAIN:  10/02/22 Are you having pain?  Yes, varies throughout the day.  1/10  PRECAUTIONS: None  RED FLAGS: None   WEIGHT BEARING RESTRICTIONS: No  FALLS:  Has patient fallen in last 6 months? Yes. Number of falls tripped over folded up mat going out of a door   OCCUPATION: Retired Publishing rights manager  PLOF: Independent, Independent with basic ADLs, Independent with household mobility without device, Independent with community mobility without device, Independent with homemaking with ambulation, Independent with gait, and Independent with transfers  PATIENT GOALS: She hopes to get updated instruction and guidance on how to resume her HEP safely and to gain control of her low back pain again to be able to resume prior level of function.  NEXT MD VISIT: prn  OBJECTIVE:   DIAGNOSTIC FINDINGS:  MRI recent:  shows progression of previous issues  PATIENT  SURVEYS:  FOTO 57, predicted 72  SCREENING FOR RED FLAGS: Bowel or bladder incontinence: No Spinal tumors: No Cauda equina syndrome: No Compression fracture: No Abdominal aneurysm: No  COGNITION: Overall cognitive status: Within functional limits for tasks assessed     SENSATION: WFL  MUSCLE LENGTH: Patient has continued restriction in bilateral hamstrings to approx 60 degrees on left and 50 degrees on right, positive Thomas test bilaterally  POSTURE:  Obvious acquired scoliosis on flexion    LUMBAR ROM:   AROM eval  Flexion Fingertips to ankles  Extension WNL  Right lateral flexion Fingertips to just above joint line  Left lateral flexion Fingertips to just above joint line  Right rotation WNL  Left rotation WNL   (Blank rows = not tested)  LOWER EXTREMITY ROM:     WFL  LOWER EXTREMITY MMT:    No significant issues or myotomal weaknesses  LUMBAR SPECIAL TESTS:  Thomas test: Positive  FUNCTIONAL TESTS:  5 times sit to stand: 12.13 sec Timed up and go (TUG): 6.38  GAIT: Distance walked: 30 Assistive device utilized: None Level of assistance: Complete Independence Comments: guarded/antalgic  TODAY'S TREATMENT:     DATE: 10/06/22 NuStep L4 x 5' PT present to discuss status Seated clam x 20 with blue loop  Sit to stand x 10 Squat to table 2x5 Supine pelvic tilt x 20 PPT with 90/90 heel taps x 20 PPT with dying bug x 20 Side lying clam 2 x 10 with blue loop Side lying reverse clam 2 x 10 with blue loop Hooklying trunk rotation x 20 Standing hip matrix 40 lbs hip abduction and extension 2 x 10 each  Seated fig 4 stretch 2x30" - second rep with option to turn body sideways and draw leg up onto table and fold trunk forward Standing IT band stretch in doorframe 2x20" Lt Added above two stretch options to HEP  DATE: 10/02/22 Nu-step L5 5 min while discussing status Lateral band walks x 3 laps of 15 feet with blue loop Seated clam x 20 with blue loop   Supine clam x 20 with blue loop Side lying clam 2 x 10 with blue loop Side lying reverse clam 2 x 10 with blue loop Standing hip matrix 40 lbs hip abduction and extension 2 x 10 each Sit to stand x 10 Squat to table x 10 Supine pelvic tilt x 20 PPT with 90/90 heel taps x 20 PPT with dying bug x 20 PPT  with knee and hip flexion/extension 2 x 10 each LE Hooklying trunk rotation x 20   DATE: 09/30/22 Nu-step L5 5 min while discussing status Standing hamstring stretch 3 x 30 sec each LE at barre with foot on stacked 8 and 6 inch steps Same step height pelvic tilt posterior first then hip flexor stretch with UE raise and reach over dynamically 10x right/left Supine pelvic tilt x 20 PPT with 90/90 heel taps x 20 PPT with dying but x 20 PPT with knee and hip flexion/extension 2 x 10 each LE Seated piriformis stretch 3 x 30 sec both Hooklying trunk rotation x 20 Completed functional tests: see above Standing hip matrix 40 lbs hip abduction and extension 2 x 10 each     PATIENT EDUCATION:  Education details: Initiated HEP Person educated: Patient Education method: Programmer, multimedia, Facilities manager, Verbal cues, and Handouts Education comprehension: verbalized understanding, returned demonstration, and verbal cues required  HOME EXERCISE PROGRAM: Access Code: [email or web address] URL: [email or web address] Date: 10/06/2022 Prepared by: Loistine Simas Jadee Golebiewski  Exercises - Trunk Sidebending with Resistance  - 1 x daily - 7 x weekly - 1 sets - 5 reps - 10 hold - Supine Hip Internal and External Rotation  - 1 x daily - 7 x weekly - 3 sets - 10 reps - Standing Hamstring Stretch on Chair  - 1 x daily - 7 x weekly - 1 sets - 3 reps - 30 sec hold - Quadriceps Stretch with Table  - 1 x daily - 7 x weekly - 1 sets - 10 reps - Seated Figure 4 Piriformis Stretch  - 1 x daily - 7 x weekly - 1 sets - 3 reps - 30 sec hold - Shoulder extension with resistance - Neutral  - 1 x daily - 7 x weekly - 2 sets -  10 reps - Standing Shoulder Row with Anchored Resistance  - 1 x daily - 7 x weekly - 2 sets - 10 reps - Shoulder External Rotation and Scapular Retraction with Resistance  - 1 x daily - 7 x weekly - 2 sets - 10 reps - Supine Posterior Pelvic Tilt  - 1 x daily - 7 x weekly - 1 sets - 20 reps - Supine 90/90 Alternating Heel Touches with Posterior Pelvic Tilt  - 1 x daily - 7 x weekly - 1 sets - 20 reps - Supine Dead Bug with Leg Extension  - 1 x daily - 7 x weekly - 1 sets - 20 reps - Supine 90/90 leg extensions  - 1 x daily - 7 x weekly - 1 sets - 20 reps - Supine Lower Trunk Rotation  - 1 x daily - 7 x weekly - 1 sets - 20 reps - Standing ITB Stretch  - 1 x daily - 7 x weekly - 1 sets - 2 reps - 20-30 hold - Seated Table Piriformis Stretch  - 1 x daily - 7 x weekly - 1 sets - 2 reps - 20-30 hold ASSESSMENT:  CLINICAL IMPRESSION: Shaconna continues to respond well to therex.  We advanced stretching options for Lt piriformis and IT band today which were added to HEP.  Pt does grow fatigued with hip abduction work in both open and closed chain.  She will continue to benefit from skilled PT to progress strength and flexibility.  OBJECTIVE IMPAIRMENTS: difficulty walking, decreased ROM, decreased strength, impaired flexibility, postural dysfunction, and pain.   ACTIVITY LIMITATIONS: carrying, lifting, bending, sitting, standing, squatting, sleeping, and bed mobility  PARTICIPATION LIMITATIONS:  meal prep, cleaning, laundry, driving, shopping, community activity, and yard work  PERSONAL FACTORS: Past/current experiences and 1-2 comorbidities: OA, left great toe pain  are also affecting patient's functional outcome.   REHAB POTENTIAL: Good  CLINICAL DECISION MAKING: Stable/uncomplicated  EVALUATION COMPLEXITY: Low   GOALS: Goals reviewed with patient? Yes  SHORT TERM GOALS: Target date: 10/07/2022   Patient will be independent with initial HEP  Baseline: Goal status: MET  2.  Pain  report to be no greater than 4/10  Baseline:  Goal status: MET 10/02/22   LONG TERM GOALS: Target date: 11/04/2022   Patient to be independent with advanced HEP  Baseline:  Goal status: INITIAL  2.  Patient to report pain no greater than 2/10  Baseline:  Goal status: INITIAL  3.  Patient to be able to stand or walk for at least 15 min without leg pain or  Baseline:  Goal status: INITIAL  4.  Patient to be able to sleep through the night  Baseline:  Goal status: INITIAL  5.  Patient to report 85% improvement in overall symptoms  Baseline:  Goal status: INITIAL  6.  FOTO score to reach predicted goal Baseline:  Goal status: INITIAL  PLAN:  PT FREQUENCY: 1-2x/week  PT DURATION: 8 weeks  PLANNED INTERVENTIONS: Therapeutic exercises, Therapeutic activity, Neuromuscular re-education, Balance training, Gait training, Patient/Family education, Self Care, Joint mobilization, Aquatic Therapy, Dry Needling, Electrical stimulation, Spinal mobilization, Cryotherapy, Moist heat, Taping, Traction, Ultrasound, Manual therapy, and Re-evaluation.  PLAN FOR NEXT SESSION: update LTG, Nustep, revisit hip strengthening, resume core strengthening from previous episode.    Morton Peters, PT 10/06/22 10:11 AM  [facility] [address], [address] [place], [place] [address] Phone # [phone] Fax [phone]

## 2022-10-07 ENCOUNTER — Encounter (HOSPITAL_BASED_OUTPATIENT_CLINIC_OR_DEPARTMENT_OTHER): Payer: Self-pay | Admitting: Orthopedic Surgery

## 2022-10-07 ENCOUNTER — Other Ambulatory Visit: Payer: Self-pay | Admitting: Rheumatology

## 2022-10-07 NOTE — Telephone Encounter (Signed)
Last Fill: 06/23/2022  Labs:  03/17/2022  MCV 98  Next Visit: 01/14/2023  Last Visit: 03/23/2022  DX: DDD (degenerative disc disease), lumbar   Current Dose per office note 03/23/2022: meloxicam 15 mg p.o. daily   Okay to refill Meloxicam?

## 2022-10-07 NOTE — Telephone Encounter (Signed)
Due to update lab work

## 2022-10-07 NOTE — Telephone Encounter (Signed)
Spoke with patient and she has labs scheduled with her PCP next month and will have results sent to our office.

## 2022-10-08 ENCOUNTER — Ambulatory Visit: Payer: Worker's Compensation

## 2022-10-08 DIAGNOSIS — R252 Cramp and spasm: Secondary | ICD-10-CM | POA: Diagnosis not present

## 2022-10-08 DIAGNOSIS — M5459 Other low back pain: Secondary | ICD-10-CM | POA: Diagnosis not present

## 2022-10-08 DIAGNOSIS — M6281 Muscle weakness (generalized): Secondary | ICD-10-CM | POA: Diagnosis not present

## 2022-10-08 DIAGNOSIS — R262 Difficulty in walking, not elsewhere classified: Secondary | ICD-10-CM

## 2022-10-08 DIAGNOSIS — M25552 Pain in left hip: Secondary | ICD-10-CM

## 2022-10-08 NOTE — Therapy (Signed)
OUTPATIENT PHYSICAL THERAPY THORACOLUMBAR TREATMENT NOTE   Patient Name: Michelle Gray MRN: [id number] DOB:1955-11-21, 67 y.o., female Today's Date: 10/08/2022  END OF SESSION:  PT End of Session - 10/08/22 1018     Visit Number 8    Date for PT Re-Evaluation 11/23/22    Authorization Type Aetna - Medicare    PT Start Time 1016    PT Stop Time 1100    PT Time Calculation (min) 44 min    Activity Tolerance Patient tolerated treatment well    Behavior During Therapy [facility] for tasks assessed/performed              Past Medical History:  Diagnosis Date   Arthritis    Asthma    Cancer (HCC) 1990   right breast ca-mast with reconstr   Cancer (HCC) 12/2020   left breast DCIS   Eczema    dx by derm per patient   Fibromyalgia    Hyperlipidemia    Hypothyroidism    Neuromuscular disorder (HCC)    Osteoporosis    PONV (postoperative nausea and vomiting)    PVC (premature ventricular contraction)    Thyroid disease    Past Surgical History:  Procedure Laterality Date   APPENDECTOMY     BREAST IMPLANT REMOVAL Right 02/11/2021   Procedure: REMOVAL RIGHT BREAST IMPLANT;  Surgeon: Glenna Fellows, MD;  Location: [facility];  Service: Plastics;  Laterality: Right;   BREAST SURGERY Right 1990   Mastectomy   CAPSULECTOMY Right 02/11/2021   Procedure: CAPSULECTOMY;  Surgeon: Glenna Fellows, MD;  Location: [facility];  Service: Plastics;  Laterality: Right;   FOOT SURGERY     LAPROSCOPIC     TOTAL MASTECTOMY Left 02/11/2021   Procedure: LEFT TOTAL MASTECTOMY;  Surgeon: Emelia Loron, MD;  Location: [facility];  Service: General;  Laterality: Left;   WISDOM TEETH REMOVAL     Patient Active Problem List   Diagnosis Date Noted   Breast cancer, left breast (HCC) 02/11/2021   Cough variant asthma with ? component upper airway cough syndrome 12/23/2018   Primary osteoarthritis of both hands 02/21/2016   Fibromyalgia 02/21/2016    History of hypothyroidism 02/21/2016   Dyslipidemia 02/21/2016   History of breast cancer 02/21/2016   Age-related osteoporosis without current pathological fracture 02/21/2016   Unspecified hypothyroidism 08/06/2013   Other and unspecified hyperlipidemia 08/06/2013   Insomnia 08/06/2013   Intrinsic asthma 08/06/2013   Hip pain, left 01/12/2011   Plantar fasciitis 01/12/2011    PCP: Pollyann Savoy, MD   REFERRING PROVIDER: Jene Every, MD  REFERRING DIAG: M54.59 (ICD-10-CM) - Other low back pain  Rationale for Evaluation and Treatment: Rehabilitation  THERAPY DIAG:  Pain in left hip  Cramp and spasm  Difficulty in walking, not elsewhere classified  Muscle weakness (generalized)  Other low back pain  ONSET DATE: 08/24/2022  SUBJECTIVE:  SUBJECTIVE STATEMENT: I was able to work out in the yard a bit.  I used good body mechanics.  I was pulling up [place] shoots.  Pain 0.5/10    PERTINENT HISTORY:  Recent   PAIN:  10/08/22 Are you having pain?  Yes, varies throughout the day.  .5/10  PRECAUTIONS: None  RED FLAGS: None   WEIGHT BEARING RESTRICTIONS: No  FALLS:  Has patient fallen in last 6 months? Yes. Number of falls tripped over folded up mat going out of a door   OCCUPATION: Retired Publishing rights manager  PLOF: Independent, Independent with basic ADLs, Independent with household mobility without device, Independent with community mobility without device, Independent with homemaking with ambulation, Independent with gait, and Independent with transfers  PATIENT GOALS: She hopes to get updated instruction and guidance on how to resume her HEP safely and to gain control of her low back pain again to be able to resume prior level of function.  NEXT MD VISIT:  prn  OBJECTIVE:   DIAGNOSTIC FINDINGS:  MRI recent:  shows progression of previous issues  PATIENT SURVEYS:  FOTO 57, predicted 12  SCREENING FOR RED FLAGS: Bowel or bladder incontinence: No Spinal tumors: No Cauda equina syndrome: No Compression fracture: No Abdominal aneurysm: No  COGNITION: Overall cognitive status: Within functional limits for tasks assessed     SENSATION: WFL  MUSCLE LENGTH: Patient has continued restriction in bilateral hamstrings to approx 60 degrees on left and 50 degrees on right, positive Thomas test bilaterally  POSTURE:  Obvious acquired scoliosis on flexion    LUMBAR ROM:   AROM eval  Flexion Fingertips to ankles  Extension WNL  Right lateral flexion Fingertips to just above joint line  Left lateral flexion Fingertips to just above joint line  Right rotation WNL  Left rotation WNL   (Blank rows = not tested)  LOWER EXTREMITY ROM:     WFL  LOWER EXTREMITY MMT:    No significant issues or myotomal weaknesses  LUMBAR SPECIAL TESTS:  Thomas test: Positive  FUNCTIONAL TESTS:  5 times sit to stand: 12.13 sec Timed up and go (TUG): 6.38  GAIT: Distance walked: 30 Assistive device utilized: None Level of assistance: Complete Independence Comments: guarded/antalgic  TODAY'S TREATMENT:     DATE: 10/08/22 NuStep L4 x 5' PT present to discuss status Seated clam x 20 with yellow loop  Standing in slightly flexed knee position with yellow loop 2 x 10 hip abd/ER Lateral band walks 3 laps of approx 15 feet Sit to stand x 10 with 5lb kb Squat to table 2x5 with 5 lb kb Supine clam x 20 with yellow loop Side lying clam 2 x 10 with yellow loop Side lying reverse clam 2 x 10 with yellow loop Supine pelvic tilt x 20 PPT with 90/90 heel taps x 20 PPT with dying bug x 20 Hooklying trunk rotation x 20 PPT with ball pass (red physio ball not available- used purple ball) Standing hip matrix 40 lbs hip abduction and extension 2 x 10 each    DATE: 10/06/22 NuStep L4 x 5' PT present to discuss status Seated clam x 20 with blue loop  Sit to stand x 10 Squat to table 2x5 Supine pelvic tilt x 20 PPT with 90/90 heel taps x 20 PPT with dying bug x 20 Side lying clam 2 x 10 with blue loop Side lying reverse clam 2 x 10 with blue loop Hooklying trunk rotation x 20 Standing hip matrix 40 lbs hip  abduction and extension 2 x 10 each  Seated fig 4 stretch 2x30" - second rep with option to turn body sideways and draw leg up onto table and fold trunk forward Standing IT band stretch in doorframe 2x20" Lt Added above two stretch options to HEP  DATE: 10/02/22 Nu-step L5 5 min while discussing status Lateral band walks x 3 laps of 15 feet with blue loop Seated clam x 20 with blue loop  Supine clam x 20 with blue loop Side lying clam 2 x 10 with blue loop Side lying reverse clam 2 x 10 with blue loop Standing hip matrix 40 lbs hip abduction and extension 2 x 10 each Sit to stand x 10 Squat to table x 10 Supine pelvic tilt x 20 PPT with 90/90 heel taps x 20 PPT with dying bug x 20 PPT with knee and hip flexion/extension 2 x 10 each LE Hooklying trunk rotation x 20   PATIENT EDUCATION:  Education details: Initiated HEP Person educated: Patient Education method: Programmer, multimedia, Facilities manager, Verbal cues, and Handouts Education comprehension: verbalized understanding, returned demonstration, and verbal cues required  HOME EXERCISE PROGRAM: Access Code: [email or web address] URL: [email or web address] Date: 10/06/2022 Prepared by: Loistine Simas Beuhring  Exercises - Trunk Sidebending with Resistance  - 1 x daily - 7 x weekly - 1 sets - 5 reps - 10 hold - Supine Hip Internal and External Rotation  - 1 x daily - 7 x weekly - 3 sets - 10 reps - Standing Hamstring Stretch on Chair  - 1 x daily - 7 x weekly - 1 sets - 3 reps - 30 sec hold - Quadriceps Stretch with Table  - 1 x daily - 7 x weekly - 1 sets - 10 reps - Seated Figure 4  Piriformis Stretch  - 1 x daily - 7 x weekly - 1 sets - 3 reps - 30 sec hold - Shoulder extension with resistance - Neutral  - 1 x daily - 7 x weekly - 2 sets - 10 reps - Standing Shoulder Row with Anchored Resistance  - 1 x daily - 7 x weekly - 2 sets - 10 reps - Shoulder External Rotation and Scapular Retraction with Resistance  - 1 x daily - 7 x weekly - 2 sets - 10 reps - Supine Posterior Pelvic Tilt  - 1 x daily - 7 x weekly - 1 sets - 20 reps - Supine 90/90 Alternating Heel Touches with Posterior Pelvic Tilt  - 1 x daily - 7 x weekly - 1 sets - 20 reps - Supine Dead Bug with Leg Extension  - 1 x daily - 7 x weekly - 1 sets - 20 reps - Supine 90/90 leg extensions  - 1 x daily - 7 x weekly - 1 sets - 20 reps - Supine Lower Trunk Rotation  - 1 x daily - 7 x weekly - 1 sets - 20 reps - Standing ITB Stretch  - 1 x daily - 7 x weekly - 1 sets - 2 reps - 20-30 hold - Seated Table Piriformis Stretch  - 1 x daily - 7 x weekly - 1 sets - 2 reps - 20-30 hold ASSESSMENT:  CLINICAL IMPRESSION: Novis is progressing appropriately.  She is tolerating higher level core work with good form.  She is compliant and well motivated.  Pain symptoms are well controlled.    She will continue to benefit from skilled PT to progress strength and flexibility.  OBJECTIVE IMPAIRMENTS: difficulty walking, decreased ROM, decreased strength,  impaired flexibility, postural dysfunction, and pain.   ACTIVITY LIMITATIONS: carrying, lifting, bending, sitting, standing, squatting, sleeping, and bed mobility  PARTICIPATION LIMITATIONS: meal prep, cleaning, laundry, driving, shopping, community activity, and yard work  PERSONAL FACTORS: Past/current experiences and 1-2 comorbidities: OA, left great toe pain  are also affecting patient's functional outcome.   REHAB POTENTIAL: Good  CLINICAL DECISION MAKING: Stable/uncomplicated  EVALUATION COMPLEXITY: Low   GOALS: Goals reviewed with patient? Yes  SHORT TERM GOALS:  Target date: 10/07/2022   Patient will be independent with initial HEP  Baseline: Goal status: MET  2.  Pain report to be no greater than 4/10  Baseline:  Goal status: MET 10/02/22   LONG TERM GOALS: Target date: 11/04/2022   Patient to be independent with advanced HEP  Baseline:  Goal status: MET 10/08/22  2.  Patient to report pain no greater than 2/10  Baseline:  Goal status: MET 10/08/22  3.  Patient to be able to stand or walk for at least 15 min without leg pain or  Baseline:  Goal status: Progressing  4.  Patient to be able to sleep through the night  Baseline:  Goal status: Progressing  5.  Patient to report 85% improvement in overall symptoms Baseline:  Goal status: INITIAL  6.  FOTO score to reach predicted goal Baseline:  Goal status: INITIAL  PLAN:  PT FREQUENCY: 1-2x/week  PT DURATION: 8 weeks  PLANNED INTERVENTIONS: Therapeutic exercises, Therapeutic activity, Neuromuscular re-education, Balance training, Gait training, Patient/Family education, Self Care, Joint mobilization, Aquatic Therapy, Dry Needling, Electrical stimulation, Spinal mobilization, Cryotherapy, Moist heat, Taping, Traction, Ultrasound, Manual therapy, and Re-evaluation.  PLAN FOR NEXT SESSION: Nustep, revisit hip strengthening, resume core strengthening from previous episode.    Victorino Dike B. Leaner Morici, PT 10/08/22 12:01 PM [facility] [address], [address] [place], [place] [address] Phone # [phone] Fax [phone]

## 2022-10-08 NOTE — Progress Notes (Signed)
Gave patient CHG soap with instructions, patient verbalized understanding. Pt given presurgical drink. Pt verbalized understanding of dos instructions.

## 2022-10-12 ENCOUNTER — Encounter: Payer: Self-pay | Admitting: Physical Therapy

## 2022-10-12 ENCOUNTER — Ambulatory Visit: Payer: Worker's Compensation | Admitting: Physical Therapy

## 2022-10-12 ENCOUNTER — Encounter: Payer: Self-pay | Admitting: Cardiology

## 2022-10-12 DIAGNOSIS — M6281 Muscle weakness (generalized): Secondary | ICD-10-CM

## 2022-10-12 DIAGNOSIS — M25552 Pain in left hip: Secondary | ICD-10-CM | POA: Diagnosis not present

## 2022-10-12 DIAGNOSIS — R252 Cramp and spasm: Secondary | ICD-10-CM | POA: Diagnosis not present

## 2022-10-12 DIAGNOSIS — R262 Difficulty in walking, not elsewhere classified: Secondary | ICD-10-CM | POA: Diagnosis not present

## 2022-10-12 DIAGNOSIS — M5459 Other low back pain: Secondary | ICD-10-CM | POA: Diagnosis not present

## 2022-10-12 NOTE — Therapy (Addendum)
OUTPATIENT PHYSICAL THERAPY THORACOLUMBAR TREATMENT NOTE   Patient Name: Michelle Gray MRN: [id number] DOB:[birth year], 67 y.o., female Today's Date: 10/12/2022  END OF SESSION:  PT End of Session - 10/12/22 1014     Visit Number 9    Date for PT Re-Evaluation 11/23/22    Authorization Type Aetna - Medicare    PT Start Time 1014    PT Stop Time 1053    PT Time Calculation (min) 39 min    Activity Tolerance Patient tolerated treatment well    Behavior During Therapy [facility] for tasks assessed/performed               Past Medical History:  Diagnosis Date   Arthritis    Asthma    Cancer (HCC) 1990   right breast ca-mast with reconstr   Cancer (HCC) 12/2020   left breast DCIS   Eczema    dx by derm per patient   Fibromyalgia    Hyperlipidemia    Hypothyroidism    Neuromuscular disorder (HCC)    Osteoporosis    PONV (postoperative nausea and vomiting)    PVC (premature ventricular contraction)    Thyroid disease    Past Surgical History:  Procedure Laterality Date   APPENDECTOMY     BREAST IMPLANT REMOVAL Right 02/11/2021   Procedure: REMOVAL RIGHT BREAST IMPLANT;  Surgeon: Glenna Fellows, MD;  Location: [facility];  Service: Plastics;  Laterality: Right;   BREAST SURGERY Right 1990   Mastectomy   CAPSULECTOMY Right 02/11/2021   Procedure: CAPSULECTOMY;  Surgeon: Glenna Fellows, MD;  [facility];  Service: Plastics;  Laterality: Right;   FOOT SURGERY     LAPROSCOPIC     TOTAL MASTECTOMY Left 02/11/2021   Procedure: LEFT TOTAL MASTECTOMY;  Surgeon: Emelia Loron, MD;  Location:  [facility];  Service: General;  Laterality: Left;   WISDOM TEETH REMOVAL     Patient Active Problem List   Diagnosis Date Noted   Breast cancer, left breast (HCC) 02/11/2021   Cough variant asthma with ? component upper airway cough syndrome 12/23/2018   Primary osteoarthritis of both hands 02/21/2016   Fibromyalgia  02/21/2016   History of hypothyroidism 02/21/2016   Dyslipidemia 02/21/2016   History of breast cancer 02/21/2016   Age-related osteoporosis without current pathological fracture 02/21/2016   Unspecified hypothyroidism 08/06/2013   Other and unspecified hyperlipidemia 08/06/2013   Insomnia 08/06/2013   Intrinsic asthma 08/06/2013   Hip pain, left 01/12/2011   Plantar fasciitis 01/12/2011    PCP: Pollyann Savoy, MD   REFERRING PROVIDER: Jene Every, MD  REFERRING DIAG: M54.59 (ICD-10-CM) - Other low back pain  Rationale for Evaluation and Treatment: Rehabilitation  THERAPY DIAG:  Pain in left hip  Cramp and spasm  Difficulty in walking, not elsewhere classified  Muscle weakness (generalized)  Other low back pain  ONSET DATE: 08/24/2022  SUBJECTIVE:  SUBJECTIVE STATEMENT: I had to come off all my OTC meds and Mobic so I am feeling more of the pain.  I am able to take Tylenol which really helps.  My toe fusion is this Thursday.   PERTINENT HISTORY:  Recent   PAIN:  10/08/22 Are you having pain?  Yes, varies throughout the day.  .5/10  PRECAUTIONS: None  RED FLAGS: None   WEIGHT BEARING RESTRICTIONS: No  FALLS:  Has patient fallen in last 6 months? Yes. Number of falls tripped over folded up mat going out of a door   OCCUPATION: Retired Publishing rights manager  PLOF: Independent, Independent with basic ADLs, Independent with household mobility without device, Independent with community mobility without device, Independent with homemaking with ambulation, Independent with gait, and Independent with transfers  PATIENT GOALS: She hopes to get updated instruction and guidance on how to resume her HEP safely and to gain control of her low back pain again to be able to resume prior  level of function.  NEXT MD VISIT: prn  OBJECTIVE:   DIAGNOSTIC FINDINGS:  MRI recent:  shows progression of previous issues  PATIENT SURVEYS:  FOTO 57, predicted 98  SCREENING FOR RED FLAGS: Bowel or bladder incontinence: No Spinal tumors: No Cauda equina syndrome: No Compression fracture: No Abdominal aneurysm: No  COGNITION: Overall cognitive status: Within functional limits for tasks assessed     SENSATION: WFL  MUSCLE LENGTH: Patient has continued restriction in bilateral hamstrings to approx 60 degrees on left and 50 degrees on right, positive Thomas test bilaterally  POSTURE:  Obvious acquired scoliosis on flexion    LUMBAR ROM:   AROM eval  Flexion Fingertips to ankles  Extension WNL  Right lateral flexion Fingertips to just above joint line  Left lateral flexion Fingertips to just above joint line  Right rotation WNL  Left rotation WNL   (Blank rows = not tested)  LOWER EXTREMITY ROM:     WFL  LOWER EXTREMITY MMT:    No significant issues or myotomal weaknesses  LUMBAR SPECIAL TESTS:  Thomas test: Positive  FUNCTIONAL TESTS:  5 times sit to stand: 12.13 sec Timed up and go (TUG): 6.38  GAIT: Distance walked: 30 Assistive device utilized: None Level of assistance: Complete Independence Comments: guarded/antalgic  TODAY'S TREATMENT:     DATE: 10/12/22 NuStep L4 x 5' PT present to discuss status Seated clam x 20 with yellow loop  Sit to stand hold 5lb yellow loop at knees, PT cued exaggerate knee opening and hip hinge to avoid momentum Standing hip matrix 40 lbs hip abduction and extension 2 x 10 each  Supine PPT x 20 PPT with 90/90 heel taps x 20 PPT with dying bug x 20 Supine LTR x 10 PPT with red physioball pass UE/LE 2x5 Side lying clam 2 x 10 with yellow loop Side lying reverse clam 2 x 10 with yellow loop Supine open book x10 each side with LE on foam roller  DATE: 10/08/22 NuStep L4 x 5' PT present to discuss status Seated clam  x 20 with yellow loop  Standing in slightly flexed knee position with yellow loop 2 x 10 hip abd/ER Lateral band walks 3 laps of approx 15 feet Sit to stand x 10 with 5lb kb Squat to table 2x5 with 5 lb kb Supine clam x 20 with yellow loop Side lying clam 2 x 10 with yellow loop Side lying reverse clam 2 x 10 with yellow loop Supine pelvic tilt x 20 PPT with  90/90 heel taps x 20 PPT with dying bug x 20 Hooklying trunk rotation x 20 PPT with ball pass (red physio ball not available- used purple ball) Standing hip matrix 40 lbs hip abduction and extension 2 x 10 each   DATE: 10/06/22 NuStep L4 x 5' PT present to discuss status Seated clam x 20 with blue loop  Sit to stand x 10 Squat to table 2x5 Supine pelvic tilt x 20 PPT with 90/90 heel taps x 20 PPT with dying bug x 20 Side lying clam 2 x 10 with blue loop Side lying reverse clam 2 x 10 with blue loop Hooklying trunk rotation x 20 Standing hip matrix 40 lbs hip abduction and extension 2 x 10 each  Seated fig 4 stretch 2x30" - second rep with option to turn body sideways and draw leg up onto table and fold trunk forward Standing IT band stretch in doorframe 2x20" Lt Added above two stretch options to HEP  PATIENT EDUCATION:  Education details: Initiated HEP Person educated: Patient Education method: Programmer, multimedia, Facilities manager, Verbal cues, and Handouts Education comprehension: verbalized understanding, returned demonstration, and verbal cues required  HOME EXERCISE PROGRAM: Access Code: [email or web address] URL: [email or web address] Date: 10/06/2022 Prepared by: Loistine Simas Squire Withey  Exercises - Trunk Sidebending with Resistance  - 1 x daily - 7 x weekly - 1 sets - 5 reps - 10 hold - Supine Hip Internal and External Rotation  - 1 x daily - 7 x weekly - 3 sets - 10 reps - Standing Hamstring Stretch on Chair  - 1 x daily - 7 x weekly - 1 sets - 3 reps - 30 sec hold - Quadriceps Stretch with Table  - 1 x daily - 7 x weekly -  1 sets - 10 reps - Seated Figure 4 Piriformis Stretch  - 1 x daily - 7 x weekly - 1 sets - 3 reps - 30 sec hold - Shoulder extension with resistance - Neutral  - 1 x daily - 7 x weekly - 2 sets - 10 reps - Standing Shoulder Row with Anchored Resistance  - 1 x daily - 7 x weekly - 2 sets - 10 reps - Shoulder External Rotation and Scapular Retraction with Resistance  - 1 x daily - 7 x weekly - 2 sets - 10 reps - Supine Posterior Pelvic Tilt  - 1 x daily - 7 x weekly - 1 sets - 20 reps - Supine 90/90 Alternating Heel Touches with Posterior Pelvic Tilt  - 1 x daily - 7 x weekly - 1 sets - 20 reps - Supine Dead Bug with Leg Extension  - 1 x daily - 7 x weekly - 1 sets - 20 reps - Supine 90/90 leg extensions  - 1 x daily - 7 x weekly - 1 sets - 20 reps - Supine Lower Trunk Rotation  - 1 x daily - 7 x weekly - 1 sets - 20 reps - Standing ITB Stretch  - 1 x daily - 7 x weekly - 1 sets - 2 reps - 20-30 hold - Seated Table Piriformis Stretch  - 1 x daily - 7 x weekly - 1 sets - 2 reps - 20-30 hold ASSESSMENT:  CLINICAL IMPRESSION: Pt had to come off OTC meds and Mobic due to upcoming toe fusion this week on Thursday.  PT advised Pt to get a new PT Rx sent over for post-op treatment to her back/hip secondary to change of medical status.  Moving forward PT will work  around WB status following toe surgery.  Pt reports she is having reduced pain, feeling stronger, more mobile and improving sitting tolerance with PT so far to date.  OBJECTIVE IMPAIRMENTS: difficulty walking, decreased ROM, decreased strength, impaired flexibility, postural dysfunction, and pain.   ACTIVITY LIMITATIONS: carrying, lifting, bending, sitting, standing, squatting, sleeping, and bed mobility  PARTICIPATION LIMITATIONS: meal prep, cleaning, laundry, driving, shopping, community activity, and yard work  PERSONAL FACTORS: Past/current experiences and 1-2 comorbidities: OA, left great toe pain  are also affecting patient's functional  outcome.   REHAB POTENTIAL: Good  CLINICAL DECISION MAKING: Stable/uncomplicated  EVALUATION COMPLEXITY: Low   GOALS: Goals reviewed with patient? Yes  SHORT TERM GOALS: Target date: 10/07/2022   Patient will be independent with initial HEP  Baseline: Goal status: MET  2.  Pain report to be no greater than 4/10  Baseline:  Goal status: MET 10/02/22   LONG TERM GOALS: Target date: 11/04/2022   Patient to be independent with advanced HEP  Baseline:  Goal status: MET 10/08/22  2.  Patient to report pain no greater than 2/10  Baseline:  Goal status: MET 10/08/22  3.  Patient to be able to stand or walk for at least 15 min without leg pain or  Baseline:  Goal status: Progressing  4.  Patient to be able to sleep through the night  Baseline:  Goal status: Progressing  5.  Patient to report 85% improvement in overall symptoms Baseline:  Goal status: INITIAL  6.  FOTO score to reach predicted goal Baseline:  Goal status: INITIAL  PLAN:  PT FREQUENCY: 1-2x/week  PT DURATION: 8 weeks  PLANNED INTERVENTIONS: Therapeutic exercises, Therapeutic activity, Neuromuscular re-education, Balance training, Gait training, Patient/Family education, Self Care, Joint mobilization, Aquatic Therapy, Dry Needling, Electrical stimulation, Spinal mobilization, Cryotherapy, Moist heat, Taping, Traction, Ultrasound, Manual therapy, and Re-evaluation.  PLAN FOR NEXT SESSION: 10th visit PN next time, Pt will be post-op from great toe fusion on 9/12, should have had a new PT Rx sent over due to change in medical status, Nustep, revisit hip strengthening, resume core strengthening from previous episode.     Morton Peters, PT 10/12/22 10:54 AM  [facility] [address], [address] [place], [place] [address] Phone # [phone] Fax [phone]

## 2022-10-15 ENCOUNTER — Ambulatory Visit (HOSPITAL_BASED_OUTPATIENT_CLINIC_OR_DEPARTMENT_OTHER): Payer: Medicare Other | Admitting: Anesthesiology

## 2022-10-15 ENCOUNTER — Ambulatory Visit (HOSPITAL_BASED_OUTPATIENT_CLINIC_OR_DEPARTMENT_OTHER): Payer: Medicare Other

## 2022-10-15 ENCOUNTER — Encounter (HOSPITAL_BASED_OUTPATIENT_CLINIC_OR_DEPARTMENT_OTHER)
Admission: RE | Disposition: A | Discharge status: Home / Self Care | Payer: Self-pay | Source: Home / Self Care | Attending: Orthopedic Surgery

## 2022-10-15 ENCOUNTER — Other Ambulatory Visit: Payer: Self-pay

## 2022-10-15 ENCOUNTER — Ambulatory Visit (HOSPITAL_BASED_OUTPATIENT_CLINIC_OR_DEPARTMENT_OTHER)
Admission: RE | Admit: 2022-10-15 | Discharge: 2022-10-15 | Disposition: A | Discharge status: Home / Self Care | Payer: Medicare Other | Attending: Orthopedic Surgery | Admitting: Orthopedic Surgery

## 2022-10-15 ENCOUNTER — Encounter (HOSPITAL_BASED_OUTPATIENT_CLINIC_OR_DEPARTMENT_OTHER): Payer: Self-pay | Admitting: Orthopedic Surgery

## 2022-10-15 DIAGNOSIS — M797 Fibromyalgia: Secondary | ICD-10-CM | POA: Diagnosis not present

## 2022-10-15 DIAGNOSIS — M1990 Unspecified osteoarthritis, unspecified site: Secondary | ICD-10-CM | POA: Diagnosis not present

## 2022-10-15 DIAGNOSIS — G8918 Other acute postprocedural pain: Secondary | ICD-10-CM | POA: Diagnosis not present

## 2022-10-15 DIAGNOSIS — J45909 Unspecified asthma, uncomplicated: Secondary | ICD-10-CM | POA: Insufficient documentation

## 2022-10-15 DIAGNOSIS — Z01818 Encounter for other preprocedural examination: Secondary | ICD-10-CM

## 2022-10-15 DIAGNOSIS — M2022 Hallux rigidus, left foot: Secondary | ICD-10-CM | POA: Diagnosis not present

## 2022-10-15 DIAGNOSIS — E039 Hypothyroidism, unspecified: Secondary | ICD-10-CM | POA: Diagnosis not present

## 2022-10-15 HISTORY — PX: ARTHRODESIS METATARSALPHALANGEAL JOINT (MTPJ): SHX6566

## 2022-10-15 SURGERY — FUSION, JOINT, GREAT TOE
Anesthesia: General | Site: Toe | Laterality: Left

## 2022-10-15 MED ORDER — DROPERIDOL 2.5 MG/ML IJ SOLN
INTRAMUSCULAR | Status: AC
  Filled 2022-10-15: qty 2

## 2022-10-15 MED ORDER — BUPIVACAINE-EPINEPHRINE 0.5% -1:200000 IJ SOLN
INTRAMUSCULAR | Status: DC | PRN
  Administered 2022-10-15: 10 mL

## 2022-10-15 MED ORDER — FENTANYL CITRATE (PF) 100 MCG/2ML IJ SOLN
INTRAMUSCULAR | Status: DC | PRN
  Administered 2022-10-15 (×2): 25 ug via INTRAVENOUS

## 2022-10-15 MED ORDER — CEFAZOLIN SODIUM-DEXTROSE 2-4 GM/100ML-% IV SOLN
2.0000 g | INTRAVENOUS | Status: AC
  Administered 2022-10-15: 2 g via INTRAVENOUS

## 2022-10-15 MED ORDER — LACTATED RINGERS IV SOLN: INTRAVENOUS | Status: DC

## 2022-10-15 MED ORDER — DOCUSATE SODIUM 100 MG PO CAPS: 100.0000 mg | ORAL_CAPSULE | Freq: Two times a day (BID) | ORAL | 0 refills | Status: DC

## 2022-10-15 MED ORDER — CEFAZOLIN SODIUM-DEXTROSE 2-4 GM/100ML-% IV SOLN
INTRAVENOUS | Status: AC
  Filled 2022-10-15: qty 100

## 2022-10-15 MED ORDER — FENTANYL CITRATE (PF) 100 MCG/2ML IJ SOLN
INTRAMUSCULAR | Status: AC
  Filled 2022-10-15: qty 2

## 2022-10-15 MED ORDER — LIDOCAINE 2% (20 MG/ML) 5 ML SYRINGE
INTRAMUSCULAR | Status: AC
  Filled 2022-10-15: qty 5

## 2022-10-15 MED ORDER — LIDOCAINE HCL (CARDIAC) PF 100 MG/5ML IV SOSY
PREFILLED_SYRINGE | INTRAVENOUS | Status: DC | PRN
  Administered 2022-10-15: 40 mg via INTRAVENOUS

## 2022-10-15 MED ORDER — DEXAMETHASONE SODIUM PHOSPHATE 10 MG/ML IJ SOLN
INTRAMUSCULAR | Status: AC
  Filled 2022-10-15: qty 1

## 2022-10-15 MED ORDER — DEXAMETHASONE SODIUM PHOSPHATE 10 MG/ML IJ SOLN
INTRAMUSCULAR | Status: DC | PRN
Start: 2022-10-15 — End: 2022-10-15
  Administered 2022-10-15: 10 mg via INTRAVENOUS

## 2022-10-15 MED ORDER — MIDAZOLAM HCL 2 MG/2ML IJ SOLN
2.0000 mg | Freq: Once | INTRAMUSCULAR | Status: AC
  Administered 2022-10-15: 2 mg via INTRAVENOUS

## 2022-10-15 MED ORDER — PROPOFOL 500 MG/50ML IV EMUL
INTRAVENOUS | Status: DC | PRN
Start: 2022-10-15 — End: 2022-10-15
  Administered 2022-10-15: 175 ug/kg/min via INTRAVENOUS

## 2022-10-15 MED ORDER — PROPOFOL 500 MG/50ML IV EMUL
INTRAVENOUS | Status: AC
  Filled 2022-10-15: qty 50

## 2022-10-15 MED ORDER — VANCOMYCIN HCL 500 MG IV SOLR
INTRAVENOUS | Status: DC | PRN
  Administered 2022-10-15: 500 mg via TOPICAL

## 2022-10-15 MED ORDER — SENNA 8.6 MG PO TABS: 2.0000 | ORAL_TABLET | Freq: Two times a day (BID) | ORAL | 0 refills | Status: DC

## 2022-10-15 MED ORDER — SODIUM CHLORIDE 0.9 % IV SOLN: INTRAVENOUS | Status: DC

## 2022-10-15 MED ORDER — FENTANYL CITRATE (PF) 100 MCG/2ML IJ SOLN
100.0000 ug | Freq: Once | INTRAMUSCULAR | Status: AC
  Administered 2022-10-15: 100 ug via INTRAVENOUS

## 2022-10-15 MED ORDER — ONDANSETRON HCL 4 MG/2ML IJ SOLN
INTRAMUSCULAR | Status: AC
  Filled 2022-10-15: qty 2

## 2022-10-15 MED ORDER — 0.9 % SODIUM CHLORIDE (POUR BTL) OPTIME
TOPICAL | Status: DC | PRN
  Administered 2022-10-15: 300 mL

## 2022-10-15 MED ORDER — PROPOFOL 10 MG/ML IV BOLUS
INTRAVENOUS | Status: AC
  Filled 2022-10-15: qty 20

## 2022-10-15 MED ORDER — ONDANSETRON HCL 4 MG/2ML IJ SOLN
INTRAMUSCULAR | Status: DC | PRN
  Administered 2022-10-15: 4 mg via INTRAVENOUS

## 2022-10-15 MED ORDER — PROPOFOL 10 MG/ML IV BOLUS
INTRAVENOUS | Status: DC | PRN
  Administered 2022-10-15: 150 mg via INTRAVENOUS

## 2022-10-15 MED ORDER — MIDAZOLAM HCL 2 MG/2ML IJ SOLN
INTRAMUSCULAR | Status: AC
  Filled 2022-10-15: qty 2

## 2022-10-15 SURGICAL SUPPLY — 79 items
APL PRP STRL LF DISP 70% ISPRP (MISCELLANEOUS) ×1
BANDAGE ESMARK 6X9 LF (GAUZE/BANDAGES/DRESSINGS) IMPLANT
BIT DRILL 2.7XCANN QCK CNCT (BIT) IMPLANT
BIT DRILL CANN 2.7 (BIT) ×1
BIT DRILL Q-C 2.0 DIA 100 (BIT) IMPLANT
BIT DRL 2.7XCANN QCK CNCT (BIT) ×1
BLADE AVERAGE 25X9 (BLADE) IMPLANT
BLADE MICRO SAGITTAL (BLADE) IMPLANT
BLADE OSC/SAG .038X5.5 CUT EDG (BLADE) IMPLANT
BLADE SURG 15 STRL LF DISP TIS (BLADE) ×2 IMPLANT
BLADE SURG 15 STRL SS (BLADE) ×3
BNDG CMPR 5X4 KNIT ELC UNQ LF (GAUZE/BANDAGES/DRESSINGS) ×1
BNDG CMPR 6 X 5 YARDS HK CLSR (GAUZE/BANDAGES/DRESSINGS)
BNDG CMPR 9X6 STRL LF SNTH (GAUZE/BANDAGES/DRESSINGS)
BNDG ELASTIC 4INX 5YD STR LF (GAUZE/BANDAGES/DRESSINGS) ×1 IMPLANT
BNDG ELASTIC 6INX 5YD STR LF (GAUZE/BANDAGES/DRESSINGS) IMPLANT
BNDG ESMARK 6X9 LF (GAUZE/BANDAGES/DRESSINGS)
BNDG GZE 12X3 1 PLY HI ABS (GAUZE/BANDAGES/DRESSINGS) ×1
BNDG STRETCH GAUZE 3IN X12FT (GAUZE/BANDAGES/DRESSINGS) ×1 IMPLANT
BOOT STEPPER DURA LG (SOFTGOODS) IMPLANT
BOOT STEPPER DURA MED (SOFTGOODS) IMPLANT
BOOT STEPPER DURA SM (SOFTGOODS) IMPLANT
BOOT STEPPER DURA XLG (SOFTGOODS) IMPLANT
CHLORAPREP W/TINT 26 (MISCELLANEOUS) ×1 IMPLANT
COVER BACK TABLE 60X90IN (DRAPES) ×1 IMPLANT
CUFF TOURN SGL QUICK 34 (TOURNIQUET CUFF) ×1
CUFF TRNQT CYL 34X4.125X (TOURNIQUET CUFF) IMPLANT
DRAPE EXTREMITY T 121X128X90 (DISPOSABLE) ×1 IMPLANT
DRAPE OEC MINIVIEW 54X84 (DRAPES) ×1 IMPLANT
DRAPE U-SHAPE 47X51 STRL (DRAPES) ×1 IMPLANT
DRSG MEPITEL 4X7.2 (GAUZE/BANDAGES/DRESSINGS) ×1 IMPLANT
ELECT REM PT RETURN 9FT ADLT (ELECTROSURGICAL) ×1
ELECTRODE REM PT RTRN 9FT ADLT (ELECTROSURGICAL) ×1 IMPLANT
GAUZE PAD ABD 8X10 STRL (GAUZE/BANDAGES/DRESSINGS) ×1 IMPLANT
GAUZE SPONGE 4X4 12PLY STRL (GAUZE/BANDAGES/DRESSINGS) ×1 IMPLANT
GLOVE BIO SURGEON STRL SZ8 (GLOVE) ×1 IMPLANT
GLOVE BIOGEL PI IND STRL 7.0 (GLOVE) IMPLANT
GLOVE BIOGEL PI IND STRL 8 (GLOVE) ×2 IMPLANT
GLOVE ECLIPSE 8.0 STRL XLNG CF (GLOVE) ×1 IMPLANT
GLOVE SURG SS PI 7.0 STRL IVOR (GLOVE) IMPLANT
GOWN STRL REUS W/ TWL LRG LVL3 (GOWN DISPOSABLE) ×1 IMPLANT
GOWN STRL REUS W/ TWL XL LVL3 (GOWN DISPOSABLE) ×2 IMPLANT
GOWN STRL REUS W/TWL LRG LVL3 (GOWN DISPOSABLE) ×1
GOWN STRL REUS W/TWL XL LVL3 (GOWN DISPOSABLE) ×2
GUIDEWIRE PIN ORTH 6X1.6XSMTH (WIRE) IMPLANT
K-WIRE 1.6 (WIRE) ×1
NDL HYPO 22X1.5 SAFETY MO (MISCELLANEOUS) IMPLANT
NEEDLE HYPO 22X1.5 SAFETY MO (MISCELLANEOUS) ×1 IMPLANT
PACK BASIN DAY SURGERY FS (CUSTOM PROCEDURE TRAY) ×1 IMPLANT
PAD CAST 4YDX4 CTTN HI CHSV (CAST SUPPLIES) ×1 IMPLANT
PADDING CAST ABS COTTON 4X4 ST (CAST SUPPLIES) IMPLANT
PADDING CAST COTTON 4X4 STRL (CAST SUPPLIES) ×1
PADDING CAST COTTON 6X4 STRL (CAST SUPPLIES) IMPLANT
PENCIL SMOKE EVACUATOR (MISCELLANEOUS) ×1 IMPLANT
PLATE TUB 39 W/COLLAR 5H (Plate) IMPLANT
SANITIZER HAND PURELL FF 515ML (MISCELLANEOUS) ×1 IMPLANT
SCREW 2.7X16MM (Screw) ×2 IMPLANT
SCREW CANN 4.0X32 BIODUR 1/3 (Screw) IMPLANT
SCREW CORT 2.5X20X2.7XST SM (Screw) IMPLANT
SCREW CORTICAL 2.7X14MM (Screw) IMPLANT
SCREW CORTICAL 2.7X20MM (Screw) ×1 IMPLANT
SCREW NLOCK CORT 2.7X16 NS (Screw) IMPLANT
SHEET MEDIUM DRAPE 40X70 STRL (DRAPES) ×1 IMPLANT
SLEEVE SCD COMPRESS KNEE MED (STOCKING) ×1 IMPLANT
SPLINT PLASTER CAST FAST 5X30 (CAST SUPPLIES) IMPLANT
SPONGE SURGIFOAM ABS GEL 12-7 (HEMOSTASIS) IMPLANT
SPONGE T-LAP 18X18 ~~LOC~~+RFID (SPONGE) ×1 IMPLANT
STOCKINETTE 6 STRL (DRAPES) ×1 IMPLANT
SUCTION TUBE FRAZIER 10FR DISP (SUCTIONS) ×1 IMPLANT
SUT ETHILON 3 0 PS 1 (SUTURE) ×1 IMPLANT
SUT MNCRL AB 3-0 PS2 18 (SUTURE) ×1 IMPLANT
SUT VIC AB 2-0 SH 27 (SUTURE) ×1
SUT VIC AB 2-0 SH 27XBRD (SUTURE) ×1 IMPLANT
SUT VICRYL 0 SH 27 (SUTURE) IMPLANT
SYR BULB EAR ULCER 3OZ GRN STR (SYRINGE) ×1 IMPLANT
SYR CONTROL 10ML LL (SYRINGE) IMPLANT
TOWEL GREEN STERILE FF (TOWEL DISPOSABLE) ×2 IMPLANT
TUBE CONNECTING 20X1/4 (TUBING) ×1 IMPLANT
UNDERPAD 30X36 HEAVY ABSORB (UNDERPADS AND DIAPERS) ×1 IMPLANT

## 2022-10-15 NOTE — Op Note (Signed)
10/15/2022  11:20 AM  PATIENT:  Michelle Gray  67 y.o. female  PRE-OPERATIVE DIAGNOSIS:  Acquired left hallux rigidus  POST-OPERATIVE DIAGNOSIS:  same  Procedure(s): 1.  Left hallux MPJ arthrodesis   2.  Left foot AP and lateral xrays  SURGEON:  Toni Arthurs, MD  ASSISTANT: Alfredo Martinez, PA-C  ANESTHESIA:   General, regional  EBL:  minimal   TOURNIQUET:   Total Tourniquet Time Documented: Thigh (Left) - 23 minutes Total: Thigh (Left) - 23 minutes  COMPLICATIONS:  None apparent  DISPOSITION:  Extubated, awake and stable to recovery.  INDICATION FOR PROCEDURE: 67 year old female without significant past medical history complains of worsening left forefoot pain.  She has signs and symptoms of hallux rigidus.  She has failed nonoperative treatment and presents today for surgery.  The risks and benefits of the alternative treatment options have been discussed in detail.  The patient wishes to proceed with surgery and specifically understands risks of bleeding, infection, nerve damage, blood clots, need for additional surgery, amputation and death.   PROCEDURE IN DETAIL:  After pre operative consent was obtained, and the correct operative site was identified, the patient was brought to the operating room and placed supine on the OR table.  Anesthesia was administered.  Pre-operative antibiotics were administered.  A surgical timeout was taken.  The left lower extremity was prepped and draped in standard sterile fashion with a tourniquet around the thigh.  The extremity was elevated, and the tourniquet was inflated to 250 mmHg.  A longitudinal incision was made over the hallux MP joint.  Dissection was carried down through the subcutaneous tissues.  The dorsal joint capsule was incised and elevated medially and laterally.  The collateral ligaments were released exposing the metatarsal head.  A concave reamer was used to remove the remaining articular cartilage and subchondral bone from the  metatarsal head.  A convex reamer was used to remove the remaining articular cartilage and subchondral bone from the base of the proximal phalanx.  The wound was irrigated copiously.  A small drill bit was used to perforate both sides of the joint leaving the resultant bone graft in place.  The joint was reduced and provisionally pinned.  Radiographs and a simulated weightbearing examination showed appropriate alignment of the joint.  The joint was then compressed with a 4 mm stainless steel Zimmer [employer] cannulated screw.  A 5 hole one quarter tubular plate from the mini frag set was then contoured to fit the dorsum of the joint.  It was secured proximally and distally with 2.7 mm bicortical screws.  AP and lateral radiographs confirmed appropriate reduction of the joint in appropriate position and length of all hardware.  The wound was irrigated copiously and sprinkled with vancomycin powder.  The subcutaneous tissues were approximated with Monocryl.  The skin incision was closed with nylon.  Sterile dressings were applied followed by a cam boot.  The tourniquet was released after application of the dressings.  The patient was awakened from anesthesia and transported to the recovery room in stable condition.  FOLLOW UP PLAN: Weightbearing as tolerated on the heel for the next 2 weeks.  Follow-up in the office in 2 weeks for suture removal.  Aspirin for DVT prophylaxis.  Plan 6 weeks postoperative weightbearing immobilization in the cam boot.   RADIOGRAPHS: AP and lateral radiographs of the left foot show interval arthrodesis of the hallux MP joint.  Hardware is appropriately positioned and of the appropriate lengths.  No other acute injuries  are noted.    Alfredo Martinez PA-C was present and scrubbed for the duration of the operative case. His assistance was essential in positioning the patient, prepping and draping, gaining and maintaining exposure, performing the operation, closing and dressing the wounds  and applying the splint.

## 2022-10-15 NOTE — Discharge Instructions (Addendum)
Post Anesthesia Home Care Instructions  Activity: Get plenty of rest for the remainder of the day. A responsible individual must stay with you for 24 hours following the procedure.  For the next 24 hours, DO NOT: -Drive a car -Advertising copywriter -Drink alcoholic beverages -Take any medication unless instructed by your physician -Make any legal decisions or sign important papers.  Meals: Start with liquid foods such as gelatin or soup. Progress to regular foods as tolerated. Avoid greasy, spicy, heavy foods. If nausea and/or vomiting occur, drink only clear liquids until the nausea and/or vomiting subsides. Call your physician if vomiting continues.  Special Instructions/Symptoms: Your throat may feel dry or sore from the anesthesia or the breathing tube placed in your throat during surgery. If this causes discomfort, gargle with warm salt water. The discomfort should disappear within 24 hours.  If you had a scopolamine patch placed behind your ear for the management of post- operative nausea and/or vomiting:  1. The medication in the patch is effective for 72 hours, after which it should be removed.  Wrap patch in a tissue and discard in the trash. Wash hands thoroughly with soap and water. 2. You may remove the patch earlier than 72 hours if you experience unpleasant side effects which may include dry mouth, dizziness or visual disturbances. 3. Avoid touching the patch. Wash your hands with soap and water after contact with the patch.     Regional Anesthesia Blocks  1. You may not be able to move or feel the "blocked" extremity after a regional anesthetic block. This may last may last from 3-48 hours after placement, but it will go away. The length of time depends on the medication injected and your individual response to the medication. As the nerves start to wake up, you may experience tingling as the movement and feeling returns to your extremity. If the numbness and inability to move  your extremity has not gone away after 48 hours, please call your surgeon.   2. The extremity that is blocked will need to be protected until the numbness is gone and the strength has returned. Because you cannot feel it, you will need to take extra care to avoid injury. Because it may be weak, you may have difficulty moving it or using it. You may not know what position it is in without looking at it while the block is in effect.  3. For blocks in the legs and feet, returning to weight bearing and walking needs to be done carefully. You will need to wait until the numbness is entirely gone and the strength has returned. You should be able to move your leg and foot normally before you try and bear weight or walk. You will need someone to be with you when you first try to ensure you do not fall and possibly risk injury.  4. Bruising and tenderness at the needle site are common side effects and will resolve in a few days.  5. Persistent numbness or new problems with movement should be communicated to the surgeon or the [facility] [phone] [facility] [phone]).  Toni Arthurs, MD [facility]  Please read the following information regarding your care after surgery.  Medications  You only need a prescription for the narcotic pain medicine (ex. oxycodone, Percocet, Norco).  All of the other medicines listed below are available over the counter. ? Aleve 2 pills twice a day for the first 3 days after surgery. ? hydrocodone as prescribed for  severe pain  Narcotic pain medicine (ex. oxycodone, Percocet, Vicodin) will cause constipation.  To prevent this problem, take the following medicines while you are taking any pain medicine. ? docusate sodium (Colace) 100 mg twice a day ? senna (Senokot) 2 tablets twice a day  Weight Bearing ? Bear weight only on your operated foot in the CAM boot.   Cast / Splint / Dressing ? Keep your splint, cast or dressing clean and  dry.  Don't put anything (coat hanger, pencil, etc) down inside of it.  If it gets damp, use a hair dryer on the cool setting to dry it.  If it gets soaked, call the office to schedule an appointment for a cast change.   After your dressing, cast or splint is removed; you may shower, but do not soak or scrub the wound.  Allow the water to run over it, and then gently pat it dry.  Swelling It is normal for you to have swelling where you had surgery.  To reduce swelling and pain, keep your toes above your nose for at least 3 days after surgery.  It may be necessary to keep your foot or leg elevated for several weeks.  If it hurts, it should be elevated.  Follow Up Call my office at [phone] when you are discharged from the hospital or surgery center to schedule an appointment to be seen two weeks after surgery.  Call my office at [phone] if you develop a fever >101.5 F, nausea, vomiting, bleeding from the surgical site or severe pain.

## 2022-10-15 NOTE — Anesthesia Procedure Notes (Signed)
Anesthesia Regional Block: Adductor canal block   Pre-Anesthetic Checklist: , timeout performed,  Correct Patient, Correct Site, Correct Laterality,  Correct Procedure, Correct Position, site marked,  Risks and benefits discussed,  Surgical consent,  Pre-op evaluation,  At surgeon's request and post-op pain management  Laterality: Lower and Left  Prep: chloraprep       Needles:  Injection technique: Single-shot  Needle Type: Stimiplex     Needle Length: 9cm  Needle Gauge: 21     Additional Needles:   Procedures:,,,, ultrasound used (permanent image in chart),,    Narrative:  Start time: 10/15/2022 8:55 AM End time: 10/15/2022 9:10 AM Injection made incrementally with aspirations every 5 mL.  Performed by: Personally  Anesthesiologist: Lewie Loron, MD  Additional Notes: BP cuff, EKG monitors applied. Sedation begun. Artery and nerve location verified with ultrasound. Anesthetic injected incrementally (5ml), slowly, and after negative aspirations under direct u/s guidance. Good fascial/perineural spread. Tolerated well.

## 2022-10-15 NOTE — Anesthesia Preprocedure Evaluation (Addendum)
Anesthesia Evaluation  Patient identified by MRN, date of birth, ID band Patient awake    Reviewed: Allergy & Precautions, NPO status , Patient's Chart, lab work & pertinent test results  History of Anesthesia Complications (+) PONV and history of anesthetic complications  Airway Mallampati: II  TM Distance: >3 FB Neck ROM: Full    Dental no notable dental hx.    Pulmonary asthma    Pulmonary exam normal breath sounds clear to auscultation       Cardiovascular negative cardio ROS Normal cardiovascular exam Rhythm:Regular Rate:Normal     Neuro/Psych negative neurological ROS  negative psych ROS   GI/Hepatic negative GI ROS, Neg liver ROS,,,  Endo/Other  Hypothyroidism    Renal/GU negative Renal ROS     Musculoskeletal  (+) Arthritis , Osteoarthritis,  Fibromyalgia -  Abdominal   Peds  Hematology negative hematology ROS (+)   Anesthesia Other Findings   Reproductive/Obstetrics                             Anesthesia Physical Anesthesia Plan  ASA: 2  Anesthesia Plan: General   Post-op Pain Management: Regional block   Induction: Intravenous  PONV Risk Score and Plan: 4 or greater and Ondansetron, Dexamethasone, Midazolam and Treatment may vary due to age or medical condition  Airway Management Planned: LMA  Additional Equipment:   Intra-op Plan:   Post-operative Plan: Extubation in OR  Informed Consent: I have reviewed the patients History and Physical, chart, labs and discussed the procedure including the risks, benefits and alternatives for the proposed anesthesia with the patient or authorized representative who has indicated his/her understanding and acceptance.     Dental advisory given  Plan Discussed with: CRNA  Anesthesia Plan Comments:         Anesthesia Quick Evaluation

## 2022-10-15 NOTE — Transfer of Care (Signed)
Immediate Anesthesia Transfer of Care Note  Patient: Michelle Gray  Procedure(s) Performed: ARTHRODESIS METATARSALPHALANGEAL JOINT (MTPJ) (Left: Toe)  Patient Location: PACU  Anesthesia Type:General and Regional  Level of Consciousness: awake, alert , and patient cooperative  Airway & Oxygen Therapy: Patient Spontanous Breathing and Patient connected to face mask oxygen  Post-op Assessment: Report given to RN and Post -op Vital signs reviewed and stable  Post vital signs: Reviewed and stable  Last Vitals:  Vitals Value Taken Time  BP 97/53 10/15/22 1120  Temp    Pulse 75 10/15/22 1121  Resp 9 10/15/22 1121  SpO2 97 % 10/15/22 1121  Vitals shown include unfiled device data.  Last Pain:  Vitals:   10/15/22 0813  TempSrc: Oral  PainSc: 2       Patients Stated Pain Goal: 3 (10/15/22 0813)  Complications: No notable events documented.

## 2022-10-15 NOTE — Anesthesia Postprocedure Evaluation (Signed)
Anesthesia Post Note  Patient: Michelle Gray  Procedure(s) Performed: ARTHRODESIS METATARSALPHALANGEAL JOINT (MTPJ) (Left: Toe)     Patient location during evaluation: PACU Anesthesia Type: General Level of consciousness: sedated and patient cooperative Pain management: pain level controlled Vital Signs Assessment: post-procedure vital signs reviewed and stable Respiratory status: spontaneous breathing Cardiovascular status: stable Anesthetic complications: no   No notable events documented.  Last Vitals:  Vitals:   10/15/22 1145 10/15/22 1212  BP: 98/60 (!) 118/41  Pulse: 60 63  Resp: 13 16  Temp:  (!) 36.1 C  SpO2: 99% 97%    Last Pain:  Vitals:   10/15/22 1212  TempSrc:   PainSc: 0-No pain                 Lewie Loron

## 2022-10-15 NOTE — Progress Notes (Signed)
Assisted Dr. Lissa Hoard with left, adductor canal, popliteal, ultrasound guided block. Side rails up, monitors on throughout procedure. See vital signs in flow sheet. Tolerated Procedure well.

## 2022-10-15 NOTE — Anesthesia Procedure Notes (Signed)
Procedure Name: LMA Insertion Date/Time: 10/15/2022 10:33 AM  Performed by: Thornell Mule, CRNAPre-anesthesia Checklist: Patient identified, Emergency Drugs available, Suction available and Patient being monitored Patient Re-evaluated:Patient Re-evaluated prior to induction Oxygen Delivery Method: Circle system utilized Preoxygenation: Pre-oxygenation with 100% oxygen Induction Type: IV induction LMA: LMA inserted LMA Size: 3.0 Number of attempts: 1 Placement Confirmation: positive ETCO2 Tube secured with: Tape Dental Injury: Teeth and Oropharynx as per pre-operative assessment

## 2022-10-15 NOTE — Anesthesia Procedure Notes (Signed)
Anesthesia Regional Block: Popliteal block   Pre-Anesthetic Checklist: , timeout performed,  Correct Patient, Correct Site, Correct Laterality,  Correct Procedure, Correct Position, site marked,  Risks and benefits discussed,  Surgical consent,  Pre-op evaluation,  At surgeon's request and post-op pain management  Laterality: Lower and Left  Prep: chloraprep       Needles:  Injection technique: Single-shot  Needle Type: Stimiplex     Needle Length: 10cm  Needle Gauge: 21     Additional Needles:   Procedures:,,,, ultrasound used (permanent image in chart),,   Motor weakness within 5 minutes.  Narrative:  Start time: 10/15/2022 9:10 AM End time: 10/15/2022 9:17 AM Injection made incrementally with aspirations every 5 mL.  Performed by: Personally  Anesthesiologist: Lewie Loron, MD  Additional Notes: Nerve located and needle positioned with direct ultrasound guidance. Good perineural spread. Patient tolerated well.

## 2022-10-15 NOTE — H&P (Signed)
Michelle Gray is an 67 y.o. female.   Chief Complaint: left foot pain HPI: 67 year old female without significant past medical history complains of worsening left foot pain over the last several years.  She has signs and symptoms of hallux rigidus.  She has failed nonoperative treatment including activity modification, oral anti-inflammatories and shoewear modification.   Past Medical History:  Diagnosis Date   Arthritis    Asthma    Cancer [facility]) 1990   right breast ca-mast with reconstr   Cancer [facility]) 12/2020   left breast DCIS   Eczema    dx by derm per patient   Fibromyalgia    Hyperlipidemia    Hypothyroidism    Neuromuscular disorder (HCC)    Osteoporosis    PONV (postoperative nausea and vomiting)    PVC (premature ventricular contraction)    Thyroid disease     Past Surgical History:  Procedure Laterality Date   APPENDECTOMY     BREAST IMPLANT REMOVAL Right 02/11/2021   Procedure: REMOVAL RIGHT BREAST IMPLANT;  Surgeon: Glenna Fellows, MD;  Location: [facility];  Service: Plastics;  Laterality: Right;   BREAST SURGERY Right 1990   Mastectomy   CAPSULECTOMY Right 02/11/2021   Procedure: CAPSULECTOMY;  Surgeon: Glenna Fellows, MD;  Location: [facility];  Service: Plastics;  Laterality: Right;   FOOT SURGERY     LAPROSCOPIC     TOTAL MASTECTOMY Left 02/11/2021   Procedure: LEFT TOTAL MASTECTOMY;  Surgeon: Emelia Loron, MD;  Location:  SURGERY CENTER;  Service: General;  Laterality: Left;   WISDOM TEETH REMOVAL      Family History  Problem Relation Age of Onset   Cancer Mother    Hyperlipidemia Mother    Hypertension Mother    Stroke Mother    Dementia Mother    Hyperlipidemia Father    Stroke Father    Prostate cancer Father    Cancer Father 21       Prostate   Hypertension Brother    Social History:  reports that she has never smoked. She has been exposed to tobacco smoke. She has never used smokeless  tobacco. She reports current alcohol use. She reports that she does not use drugs.  Allergies:  Allergies  Allergen Reactions   Compazine Other (See Comments)    Psych effects    Tape Rash    Allergy to adhesive per patient.     Medications Prior to Admission  Medication Sig Dispense Refill   5-HTP CAPS Take 2 tablets by mouth at bedtime.     albuterol (PROVENTIL HFA;VENTOLIN HFA) 108 (90 BASE) MCG/ACT inhaler Inhale 2 puffs into the lungs every 6 (six) hours as needed for wheezing or shortness of breath.     Biotin 5000 MCG CAPS Take 5,000 mcg by mouth daily.     budesonide-formoterol (SYMBICORT) 160-4.5 MCG/ACT inhaler Inhale 2 puffs into the lungs in the morning and at bedtime. 1 each 11   Cholecalciferol (VITAMIN D3) 5000 units CAPS Take 7,000 Units by mouth once a week.     Coenzyme Q10 (COQ-10) 100 MG CAPS      diltiazem (CARDIZEM CD) 180 MG 24 hr capsule Take 1 capsule (180 mg total) by mouth daily. 90 capsule 3   estradiol (ESTRACE) 0.1 MG/GM vaginal cream Insert 1 g twice a week by vaginal route as directed for 30 days.     ezetimibe (ZETIA) 10 MG tablet TAKE ONE TABLET BY MOUTH DAILY 90 tablet 3  gabapentin (NEURONTIN) 300 MG capsule Take 1 capsule (300 mg total) by mouth at bedtime. 60 capsule 2   Glucosamine 500 MG TABS Take 1,500 mg by mouth daily.     levothyroxine (SYNTHROID, LEVOTHROID) 50 MCG tablet 50 mcg 6 days weekly.     levothyroxine (SYNTHROID, LEVOTHROID) 75 MCG tablet Take 75 mcg by mouth once a week.     Magnesium 500 MG CAPS Take 500 mg by mouth daily.     meloxicam (MOBIC) 15 MG tablet TAKE 1 TABLET BY MOUTH DAILY 90 tablet 0   methocarbamol (ROBAXIN) 750 MG tablet Take 1 tablet (750 mg total) by mouth at bedtime as needed for muscle spasms. 90 tablet 0   Misc Natural Products (TART CHERRY ADVANCED PO) Take by mouth.     mometasone (ELOCON) 0.1 % cream Apply 1 Application topically daily as needed (As needed).     rosuvastatin (CRESTOR) 20 MG tablet TAKE  ONE TABLET BY MOUTH DAILY 90 tablet 3   zolpidem (AMBIEN) 10 MG tablet TAKE 1 TABLET BY MOUTH AT BEDTIME 30 tablet 2    No results found for this or any previous visit (from the past 48 hour(s)). No results found.  Review of Systems no recent fever, chills, nausea, vomiting or changes in her appetite  Blood pressure (!) 100/57, pulse 81, temperature 98.1 F (36.7 C), temperature source Oral, resp. rate (!) 23, height 4' 9.25" (1.454 m), weight 74 kg, SpO2 97%. Physical Exam  Well-nourished well-developed woman in no apparent distress.  Alert and oriented.  Normal mood and affect.  Gait is slightly antalgic to the left.  The left forefoot has decreased range of motion of the hallux MP joint.  There are some swelling around the joint.  Skin is healthy.  Pulses are palpable.  No lymphadenopathy.  Active plantarflexion dorsiflexion strength at the toes.   Assessment/Plan Left hallux rigidus -to the operating room today for hallux MP joint arthrodesis.  The risks and benefits of the alternative treatment options have been discussed in detail.  The patient wishes to proceed with surgery and specifically understands risks of bleeding, infection, nerve damage, blood clots, need for additional surgery, amputation and death.   Toni Arthurs, MD 22-Oct-2022, 8:21 AM

## 2022-10-16 ENCOUNTER — Encounter (HOSPITAL_BASED_OUTPATIENT_CLINIC_OR_DEPARTMENT_OTHER): Payer: Self-pay | Admitting: Orthopedic Surgery

## 2022-10-21 ENCOUNTER — Ambulatory Visit: Payer: Worker's Compensation

## 2022-10-26 ENCOUNTER — Encounter: Payer: Self-pay | Admitting: Cardiology

## 2022-11-05 ENCOUNTER — Ambulatory Visit (INDEPENDENT_AMBULATORY_CARE_PROVIDER_SITE_OTHER): Payer: Medicare Other | Admitting: Internal Medicine

## 2022-11-05 ENCOUNTER — Ambulatory Visit: Payer: Medicare Other | Attending: Obstetrics & Gynecology

## 2022-11-05 ENCOUNTER — Encounter: Payer: Self-pay | Admitting: Internal Medicine

## 2022-11-05 VITALS — BP 122/76 | HR 77 | Temp 97.9°F | Ht 67.0 in | Wt 167.0 lb

## 2022-11-05 DIAGNOSIS — J45991 Cough variant asthma: Secondary | ICD-10-CM

## 2022-11-05 DIAGNOSIS — R252 Cramp and spasm: Secondary | ICD-10-CM | POA: Insufficient documentation

## 2022-11-05 DIAGNOSIS — R262 Difficulty in walking, not elsewhere classified: Secondary | ICD-10-CM | POA: Diagnosis not present

## 2022-11-05 DIAGNOSIS — M6281 Muscle weakness (generalized): Secondary | ICD-10-CM | POA: Diagnosis not present

## 2022-11-05 DIAGNOSIS — M25552 Pain in left hip: Secondary | ICD-10-CM | POA: Insufficient documentation

## 2022-11-05 DIAGNOSIS — M5459 Other low back pain: Secondary | ICD-10-CM | POA: Insufficient documentation

## 2022-11-05 MED ORDER — ALBUTEROL SULFATE HFA 108 (90 BASE) MCG/ACT IN AERS: 2.0000 | INHALATION_SPRAY | Freq: Four times a day (QID) | RESPIRATORY_TRACT | 1 refills | Status: AC | PRN

## 2022-11-05 MED ORDER — BUDESONIDE-FORMOTEROL FUMARATE 160-4.5 MCG/ACT IN AERO: 2.0000 | INHALATION_SPRAY | Freq: Two times a day (BID) | RESPIRATORY_TRACT | 11 refills | Status: DC

## 2022-11-05 NOTE — Progress Notes (Signed)
Michelle Gray, female    DOB: 30-Jul-1955,     MRN: [id number]   Brief patient profile:  50 yowf never smoker/NP for [facility]  with onset asthma around 1990 eval here around 2005 on prn saba rarely needed but esp in extremes of heat /cold but  not spring / fall and no assoc cough or need for maint  then around 2016 developed chest tightness and wheezing more of a chronic pattern improved p rx symb 80 2bid improved and rare saba then early 2020 noted more doe so around late Oct 2020 changed symb 160 and some better but still uncomfortable with deep breath so self referred back to pulmonary 12/22/2018 .     History of Present Illness  12/22/2018  Pulmonary/ 1st office eval/Michelle Gray  Chief Complaint  Patient presents with   Pulmonary Consult    Self referral- asthma- trouble taking a deep breath and wheezing. She is using her albuterol inhaler daily.   Dyspnea:  Ex bike not using and limited by fibromyalgia/ plantar fasciitis/ ok with yard work and steps / some chest discomfort midline with deep breath but not with ex Cough: not a lot but finds herself throat clearing x 30 years  " just her like her dad" no mucus production and only happens while awake  Sleep: no resp problem SABA use: p symb 160 x 2 6 am  Typically feels needs saba w/in a few hours  No nasal symptoms  rec Plan A = Automatic = Always=    symbicort 160 Take 2 puffs first thing in am and then another 2 puffs about 12 hours later.  Work on inhaler technique: Plan B = Backup (to supplement plan A, not to replace it) Only use your albuterol inhaler as a rescue medication  Pantoprazole (protonix) 40 mg   Take  30-60 min before first meal of the day and Pepcid (famotidine)  20 mg one @  After supper   GERD diet televisit  01/19/19 rec: Add gabapentin 300 mg every morning to see if helps the daytime cough     10/14/2020  f/u ov/Michelle Gray re: asthma   maint on symbicort 160   Chief Complaint  Patient presents with   Follow-up     Increased cough for the past month-occurs when she takes a deep breath. She has occ chest tightness.    Dyspnea:  no aerobics Cough: on deep insp, no ex/ dry x 4 weeks  Sleeping: slt elevation sleep number  SABA use: rarely / eg uri  02: none  Covid status:   vax x 4, never infected  Rec Restart protonix 40 mg Take 30-60 min before first meal of the day x at least a month to see what benefit if any it has on the cough Please schedule a follow up visit in 12  months but call sooner if needed    10/20/2021  f/u ov/Michelle Gray re: asthma/ maint on symbicort 160     Chief Complaint  Patient presents with   Follow-up    Doing well  Dyspnea:  very active but no aerobics  Cough: throat clearing/ better with robitussin   Sleeping: sleep number  SABA use: never  02: none  Rec Try symbicort 80 Take 2 puffs first thing in am and then another 2 puffs about 12 hours later.    11/05/2022  f/u ov/Michelle Gray re: asthma   maint on symbicort 160   Chief Complaint  Patient presents with   Follow-up  Doing well.  Dyspnea:  Not limited by breathing from desired activities  / wearing boot  Cough: none  Sleeping: 10-15 degrees electric one pillow no  resp cc  SABA use: none  02: none     No obvious day to day or daytime variability or assoc excess/ purulent sputum or mucus plugs or hemoptysis or cp or chest tightness, subjective wheeze or overt sinus or hb symptoms.    Also denies any obvious fluctuation of symptoms with weather or environmental changes or other aggravating or alleviating factors except as outlined above   No unusual exposure hx or h/o childhood pna/ asthma or knowledge of premature birth.  Current Allergies, Complete Past Medical History, Past Surgical History, Family History, and Social History were reviewed in [employer] record.  ROS  The following are not active complaints unless bolded Hoarseness, sore throat, dysphagia, dental problems, itching, sneezing,   nasal congestion or discharge of excess mucus or purulent secretions, ear ache,   fever, chills, sweats, unintended wt loss or wt gain, classically pleuritic or exertional cp,  orthopnea pnd or arm/hand swelling  or leg swelling, presyncope, palpitations, abdominal pain, anorexia, nausea, vomiting, diarrhea  or change in bowel habits or change in bladder habits, change in stools or change in urine, dysuria, hematuria,  rash, arthralgias, visual complaints, headache, numbness, weakness or ataxia or problems with walking s/p L foot surgery  or coordination,  change in mood or  memory.        Current Meds  Medication Sig   5-HTP CAPS Take 2 tablets by mouth at bedtime.   albuterol (PROVENTIL HFA;VENTOLIN HFA) 108 (90 BASE) MCG/ACT inhaler Inhale 2 puffs into the lungs every 6 (six) hours as needed for wheezing or shortness of breath.   Biotin 5000 MCG CAPS Take 5,000 mcg by mouth daily.   budesonide-formoterol (SYMBICORT) 160-4.5 MCG/ACT inhaler Inhale 2 puffs into the lungs in the morning and at bedtime.   Cholecalciferol (VITAMIN D3) 5000 units CAPS Take 7,000 Units by mouth once a week.   Coenzyme Q10 (COQ-10) 100 MG CAPS    estradiol (ESTRACE) 0.1 MG/GM vaginal cream Insert 1 g twice a week by vaginal route as directed for 30 days.   ezetimibe (ZETIA) 10 MG tablet TAKE ONE TABLET BY MOUTH DAILY   gabapentin (NEURONTIN) 300 MG capsule Take 1 capsule (300 mg total) by mouth at bedtime.   Glucosamine 500 MG TABS Take 1,500 mg by mouth daily.   levothyroxine (SYNTHROID, LEVOTHROID) 50 MCG tablet 50 mcg 6 days weekly.   levothyroxine (SYNTHROID, LEVOTHROID) 75 MCG tablet Take 75 mcg by mouth once a week.   Magnesium 500 MG CAPS Take 500 mg by mouth daily.   meloxicam (MOBIC) 15 MG tablet TAKE 1 TABLET BY MOUTH DAILY   methocarbamol (ROBAXIN) 750 MG tablet Take 1 tablet (750 mg total) by mouth at bedtime as needed for muscle spasms.   Misc Natural Products (TART CHERRY ADVANCED PO) Take by mouth.    mometasone (ELOCON) 0.1 % cream Apply 1 Application topically daily as needed (As needed).   rosuvastatin (CRESTOR) 20 MG tablet TAKE ONE TABLET BY MOUTH DAILY   zolpidem (AMBIEN) 10 MG tablet TAKE 1 TABLET BY MOUTH AT BEDTIME                 Past Medical History:  Diagnosis Date   Anemia    Arthritis    Asthma    Cancer (HCC)    Hyperlipidemia  Neuromuscular disorder (HCC)    Osteoporosis    Thyroid disease         Objective:    11/05/2022        167   10/20/2021        163  10/14/2020        155   10/03/19 150 lb 6.4 oz (68.2 kg)  09/20/19 151 lb 9.6 oz (68.8 kg)  09/18/19 153 lb 12.8 oz (69.8 kg)    Vital signs reviewed  11/05/2022  - Note at rest 02 sats  97% on RA   General appearance:    amb wf nad walking with L leg boot    HEENT : Oropharynx  clear/ no thrush         NECK :  without  apparent JVD/ palpable Nodes/TM    LUNGS: no acc muscle use,  Nl contour chest which is clear to A and P bilaterally without cough on insp or exp maneuvers   CV:  RRR  no s3 or murmur or increase in P2, and no edema   ABD:  soft and nontender    MS:  Nl gait/ ext warm without deformities Or  calf tenderness, cyanosis or clubbing    SKIN: warm and dry without lesions    NEURO:  alert, approp, nl sensorium with  no motor or cerebellar deficits apparent.              Assessment

## 2022-11-05 NOTE — Therapy (Signed)
OUTPATIENT PHYSICAL THERAPY THORACOLUMBAR TREATMENT NOTE Progress Note Reporting Period 09/28/22 to 11/05/22  See note below for Objective Data and Assessment of Progress/Goals.    Patient Name: Michelle Gray MRN: [id number] DOB:Apr 13, 1955, 67 y.o., female Today's Date: 11/05/2022  END OF SESSION:  PT End of Session - 11/05/22 1013     Visit Number 10    Date for PT Re-Evaluation 11/23/22    Authorization Type [employer] - Medicare    PT Start Time 1015    PT Stop Time 1100    PT Time Calculation (min) 45 min    Activity Tolerance Patient tolerated treatment well    Behavior During Therapy [facility] for tasks assessed/performed               Past Medical History:  Diagnosis Date   Arthritis    Asthma    Cancer (HCC) 1990   right breast ca-mast with reconstr   Cancer (HCC) 12/2020   left breast DCIS   Eczema    dx by derm per patient   Fibromyalgia    Hyperlipidemia    Hypothyroidism    Neuromuscular disorder (HCC)    Osteoporosis    PONV (postoperative nausea and vomiting)    PVC (premature ventricular contraction)    Thyroid disease    Past Surgical History:  Procedure Laterality Date   APPENDECTOMY     ARTHRODESIS METATARSALPHALANGEAL JOINT (MTPJ) Left 10/15/2022   Procedure: ARTHRODESIS METATARSALPHALANGEAL JOINT (MTPJ);  Surgeon: Toni Arthurs, MD;  Location: [facility];  Service: Orthopedics;  Laterality: Left;   BREAST IMPLANT REMOVAL Right 02/11/2021   Procedure: REMOVAL RIGHT BREAST IMPLANT;  Surgeon: Glenna Fellows, MD;  Location: [facility];  Service: Plastics;  Laterality: Right;   BREAST SURGERY Right 1990   Mastectomy   CAPSULECTOMY Right 02/11/2021   Procedure: CAPSULECTOMY;  Surgeon: Glenna Fellows, MD;  Location: [facility];  Service: Plastics;  Laterality: Right;   FOOT SURGERY     LAPROSCOPIC     TOTAL MASTECTOMY Left 02/11/2021   Procedure: LEFT TOTAL MASTECTOMY;  Surgeon: Emelia Loron, MD;   Location: [facility];  Service: General;  Laterality: Left;   WISDOM TEETH REMOVAL     Patient Active Problem List   Diagnosis Date Noted   Breast cancer, left breast (HCC) 02/11/2021   Cough variant asthma with ? component upper airway cough syndrome 12/23/2018   Primary osteoarthritis of both hands 02/21/2016   Fibromyalgia 02/21/2016   History of hypothyroidism 02/21/2016   Dyslipidemia 02/21/2016   History of breast cancer 02/21/2016   Age-related osteoporosis without current pathological fracture 02/21/2016   Hypothyroidism 08/06/2013   Other and unspecified hyperlipidemia 08/06/2013   Insomnia 08/06/2013   Intrinsic asthma 08/06/2013   Hip pain, left 01/12/2011   Plantar fasciitis 01/12/2011    PCP: Pollyann Savoy, MD   REFERRING PROVIDER: Jene Every, MD  REFERRING DIAG: M54.59 (ICD-10-CM) - Other low back pain  Rationale for Evaluation and Treatment: Rehabilitation  THERAPY DIAG:  Pain in left hip  Cramp and spasm  Difficulty in walking, not elsewhere classified  Muscle weakness (generalized)  Other low back pain  ONSET DATE: 08/24/2022  SUBJECTIVE:  SUBJECTIVE STATEMENT: Patient states she had her foot surgery.  She arrives in cam boot but did obtain the "level up" shoe attachment for the right shoe.  She is using SPC.  She states she has been sitting a lot at home.  My back and hip pain are staying well controlled.  Pain reported as .5/10.   PERTINENT HISTORY:  Recent   PAIN:  10/08/22 Are you having pain?  Yes, varies throughout the day.  .5/10  PRECAUTIONS: None  RED FLAGS: None   WEIGHT BEARING RESTRICTIONS: No  FALLS:  Has patient fallen in last 6 months? Yes. Number of falls tripped over folded up mat going out of a door   OCCUPATION:  Retired Publishing rights manager  PLOF: Independent, Independent with basic ADLs, Independent with household mobility without device, Independent with community mobility without device, Independent with homemaking with ambulation, Independent with gait, and Independent with transfers  PATIENT GOALS: She hopes to get updated instruction and guidance on how to resume her HEP safely and to gain control of her low back pain again to be able to resume prior level of function.  NEXT MD VISIT: prn  OBJECTIVE:   DIAGNOSTIC FINDINGS:  MRI recent:  shows progression of previous issues  PATIENT SURVEYS:  FOTO 57, predicted 62  SCREENING FOR RED FLAGS: Bowel or bladder incontinence: No Spinal tumors: No Cauda equina syndrome: No Compression fracture: No Abdominal aneurysm: No  COGNITION: Overall cognitive status: Within functional limits for tasks assessed     SENSATION: WFL  MUSCLE LENGTH: Patient has continued restriction in bilateral hamstrings to approx 60 degrees on left and 50 degrees on right, positive Thomas test bilaterally  POSTURE:  Obvious acquired scoliosis on flexion    LUMBAR ROM:   AROM eval  Flexion Fingertips to ankles  Extension WNL  Right lateral flexion Fingertips to just above joint line  Left lateral flexion Fingertips to just above joint line  Right rotation WNL  Left rotation WNL   (Blank rows = not tested)  LOWER EXTREMITY ROM:     WFL  LOWER EXTREMITY MMT:    No significant issues or myotomal weaknesses  LUMBAR SPECIAL TESTS:  Thomas test: Positive  FUNCTIONAL TESTS:  5 times sit to stand: 12.13 sec Timed up and go (TUG): 6.38  GAIT: Distance walked: 30 Assistive device utilized: None Level of assistance: Complete Independence Comments: guarded/antalgic  TODAY'S TREATMENT:     DATE: 11/05/22 NuStep L4 x 5' PT present to discuss status Supine hamstring stretch 3 x 30 sec both Supine IT band stretch 3 x 30 sec both PPT x 20 PPT with 90/90  heel taps x 20 PPT with dying bug x 20 Supine PPT with clamshell with red loop x 20 Sidelying clam x 20 each LE with red loop PPT with red physioball pass UE/LE 2 x 10 Supine LTR x 10 Supine open book x10 each side Side lying reverse clam 2 x 10 with yellow loop Sit to stand hold 5lb yellow loop at knees, PT cued exaggerate knee opening and hip hinge to avoid momentum  DATE: 10/12/22 NuStep L4 x 5' PT present to discuss status Seated clam x 20 with yellow loop  Sit to stand hold 5lb yellow loop at knees, PT cued exaggerate knee opening and hip hinge to avoid momentum Standing hip matrix 40 lbs hip abduction and extension 2 x 10 each  Supine PPT x 20 PPT with 90/90 heel taps x 20 PPT with dying bug x 20 Supine  LTR x 10 PPT with red physioball pass UE/LE 2x5 Side lying clam 2 x 10 with yellow loop Side lying reverse clam 2 x 10 with yellow loop Supine open book x10 each side with LE on foam roller  DATE: 10/08/22 NuStep L4 x 5' PT present to discuss status Seated clam x 20 with yellow loop  Standing in slightly flexed knee position with yellow loop 2 x 10 hip abd/ER Lateral band walks 3 laps of approx 15 feet Sit to stand x 10 with 5lb kb Squat to table 2x5 with 5 lb kb Supine clam x 20 with yellow loop Side lying clam 2 x 10 with yellow loop Side lying reverse clam 2 x 10 with yellow loop Supine pelvic tilt x 20 PPT with 90/90 heel taps x 20 PPT with dying bug x 20 Hooklying trunk rotation x 20 PPT with ball pass (red physio ball not available- used purple ball) Standing hip matrix 40 lbs hip abduction and extension 2 x 10 each    PATIENT EDUCATION:  Education details: Initiated HEP Person educated: Patient Education method: Programmer, multimedia, Facilities manager, Verbal cues, and Handouts Education comprehension: verbalized understanding, returned demonstration, and verbal cues required  HOME EXERCISE PROGRAM: Access Code: [email or web address] URL: [email or web address] Date:  10/06/2022 Prepared by: Loistine Simas Beuhring  Exercises - Trunk Sidebending with Resistance  - 1 x daily - 7 x weekly - 1 sets - 5 reps - 10 hold - Supine Hip Internal and External Rotation  - 1 x daily - 7 x weekly - 3 sets - 10 reps - Standing Hamstring Stretch on Chair  - 1 x daily - 7 x weekly - 1 sets - 3 reps - 30 sec hold - Quadriceps Stretch with Table  - 1 x daily - 7 x weekly - 1 sets - 10 reps - Seated Figure 4 Piriformis Stretch  - 1 x daily - 7 x weekly - 1 sets - 3 reps - 30 sec hold - Shoulder extension with resistance - Neutral  - 1 x daily - 7 x weekly - 2 sets - 10 reps - Standing Shoulder Row with Anchored Resistance  - 1 x daily - 7 x weekly - 2 sets - 10 reps - Shoulder External Rotation and Scapular Retraction with Resistance  - 1 x daily - 7 x weekly - 2 sets - 10 reps - Supine Posterior Pelvic Tilt  - 1 x daily - 7 x weekly - 1 sets - 20 reps - Supine 90/90 Alternating Heel Touches with Posterior Pelvic Tilt  - 1 x daily - 7 x weekly - 1 sets - 20 reps - Supine Dead Bug with Leg Extension  - 1 x daily - 7 x weekly - 1 sets - 20 reps - Supine 90/90 leg extensions  - 1 x daily - 7 x weekly - 1 sets - 20 reps - Supine Lower Trunk Rotation  - 1 x daily - 7 x weekly - 1 sets - 20 reps - Standing ITB Stretch  - 1 x daily - 7 x weekly - 1 sets - 2 reps - 20-30 hold - Seated Table Piriformis Stretch  - 1 x daily - 7 x weekly - 1 sets - 2 reps - 20-30 hold ASSESSMENT:  CLINICAL IMPRESSION: Ariyah had very little setback following her foot surgery.  She obtained the "level up" shoe attachment which is helping with hip symmetry.  She was able to do all activities including the high level activities.  She is  compliant with her HEP and well motivated.  She should continue to improve.  She would benefit from continued skilled PT for core stabilization, hip strengthening and LE flexibility exercises.    OBJECTIVE IMPAIRMENTS: difficulty walking, decreased ROM, decreased strength,  impaired flexibility, postural dysfunction, and pain.   ACTIVITY LIMITATIONS: carrying, lifting, bending, sitting, standing, squatting, sleeping, and bed mobility  PARTICIPATION LIMITATIONS: meal prep, cleaning, laundry, driving, shopping, community activity, and yard work  PERSONAL FACTORS: Past/current experiences and 1-2 comorbidities: OA, left great toe pain  are also affecting patient's functional outcome.   REHAB POTENTIAL: Good  CLINICAL DECISION MAKING: Stable/uncomplicated  EVALUATION COMPLEXITY: Low   GOALS: Goals reviewed with patient? Yes  SHORT TERM GOALS: Target date: 10/07/2022   Patient will be independent with initial HEP  Baseline: Goal status: MET  2.  Pain report to be no greater than 4/10  Baseline:  Goal status: MET 10/02/22   LONG TERM GOALS: Target date: 11/04/2022   Patient to be independent with advanced HEP  Baseline:  Goal status: MET 10/08/22  2.  Patient to report pain no greater than 2/10  Baseline:  Goal status: MET 10/08/22  3.  Patient to be able to stand or walk for at least 15 min without leg pain or  Baseline:  Goal status: Progressing  4.  Patient to be able to sleep through the night  Baseline:  Goal status: Progressing  5.  Patient to report 85% improvement in overall symptoms Baseline:  Goal status: Progressing 11/05/22  6.  FOTO score to reach predicted goal Baseline:  Goal status: Progressing 11/05/22  PLAN:  PT FREQUENCY: 1-2x/week  PT DURATION: 8 weeks  PLANNED INTERVENTIONS: Therapeutic exercises, Therapeutic activity, Neuromuscular re-education, Balance training, Gait training, Patient/Family education, Self Care, Joint mobilization, Aquatic Therapy, Dry Needling, Electrical stimulation, Spinal mobilization, Cryotherapy, Moist heat, Taping, Traction, Ultrasound, Manual therapy, and Re-evaluation.  PLAN FOR NEXT SESSION: Nustep, progress hip strengthening,  core strengthening.     Victorino Dike B. Alma Muegge, PT 11/05/22  12:09 PM [facility] [address], [address] [place], [place] [address] Phone # [phone] Fax [phone]

## 2022-11-05 NOTE — Patient Instructions (Signed)
No change in medications   Please schedule a follow up visit in 12  months but call sooner if needed  

## 2022-11-05 NOTE — Assessment & Plan Note (Signed)
?   Onset 1990s - initial pulmonary eval 2005 [facility] (paper chart requested) - worse since 2018 rx symb 80 2bid and increased to 160 2bid  Oct 2020 - spacer added 12/22/2018 as has component of uacs > improved 01/19/2019 but not resolved - 01/19/2019  rec titrate gabapentin to max of 300 qid to see if helps> never needed  - flared off ppi > restart 10/14/2020 > not maint on gerd rx 11/05/2022 but no symptoms > leave off   All goals of chronic asthma control met including optimal function and elimination of symptoms with minimal need for rescue therapy.  Contingencies discussed in full including contacting this office immediately if not controlling the symptoms using the rule of two's.      F/u yearly approp          Each maintenance medication was reviewed in detail including emphasizing most importantly the difference between maintenance and prns and under what circumstances the prns are to be triggered using an action plan format where appropriate.  Total time for H and P, chart review, counseling, reviewing hfa device(s) and generating customized AVS unique to this office visit / same day charting = 20 min

## 2022-11-12 ENCOUNTER — Ambulatory Visit: Payer: Medicare Other

## 2022-11-12 DIAGNOSIS — M6281 Muscle weakness (generalized): Secondary | ICD-10-CM | POA: Diagnosis not present

## 2022-11-12 DIAGNOSIS — M5459 Other low back pain: Secondary | ICD-10-CM

## 2022-11-12 DIAGNOSIS — M25552 Pain in left hip: Secondary | ICD-10-CM | POA: Diagnosis not present

## 2022-11-12 DIAGNOSIS — R262 Difficulty in walking, not elsewhere classified: Secondary | ICD-10-CM

## 2022-11-12 DIAGNOSIS — R252 Cramp and spasm: Secondary | ICD-10-CM | POA: Diagnosis not present

## 2022-11-12 NOTE — Therapy (Signed)
OUTPATIENT PHYSICAL THERAPY THORACOLUMBAR TREATMENT NOTE Progress Note Reporting Period 09/28/22 to 11/05/22  See note below for Objective Data and Assessment of Progress/Goals.    Patient Name: Michelle Gray MRN: [id number] DOB:[birth year], 67 y.o., female Today's Date: 11/12/2022  END OF SESSION:  PT End of Session - 11/12/22 1022     Visit Number 11    Date for PT Re-Evaluation 11/23/22    Authorization Type Aetna - Medicare    PT Start Time 1015    PT Stop Time 1100    PT Time Calculation (min) 45 min    Activity Tolerance Patient tolerated treatment well    Behavior During Therapy [facility] for tasks assessed/performed               Past Medical History:  Diagnosis Date   Arthritis    Asthma    Cancer (HCC) 1990   right breast ca-mast with reconstr   Cancer (HCC) 12/2020   left breast DCIS   Eczema    dx by derm per patient   Fibromyalgia    Hyperlipidemia    Hypothyroidism    Neuromuscular disorder (HCC)    Osteoporosis    PONV (postoperative nausea and vomiting)    PVC (premature ventricular contraction)    Thyroid disease    Past Surgical History:  Procedure Laterality Date   APPENDECTOMY     ARTHRODESIS METATARSALPHALANGEAL JOINT (MTPJ) Left 10/15/2022   Procedure: ARTHRODESIS METATARSALPHALANGEAL JOINT (MTPJ);  Surgeon: Toni Arthurs, MD;  Location: [facility];  Service: Orthopedics;  Laterality: Left;   BREAST IMPLANT REMOVAL Right 02/11/2021   Procedure: REMOVAL RIGHT BREAST IMPLANT;  Surgeon: Glenna Fellows, MD;  Location: [facility];  Service: Plastics;  Laterality: Right;   BREAST SURGERY Right 1990   Mastectomy   CAPSULECTOMY Right 02/11/2021   Procedure: CAPSULECTOMY;  Surgeon: Glenna Fellows, MD;  Location: [facility];  Service: Plastics;  Laterality: Right;   FOOT SURGERY     LAPROSCOPIC     TOTAL MASTECTOMY Left 02/11/2021   Procedure: LEFT TOTAL MASTECTOMY;  Surgeon: Emelia Loron, MD;   Location: [facility];  Service: General;  Laterality: Left;   WISDOM TEETH REMOVAL     Patient Active Problem List   Diagnosis Date Noted   Breast cancer, left breast (HCC) 02/11/2021   Cough variant asthma with ? component upper airway cough syndrome 12/23/2018   Primary osteoarthritis of both hands 02/21/2016   Fibromyalgia 02/21/2016   History of hypothyroidism 02/21/2016   Dyslipidemia 02/21/2016   History of breast cancer 02/21/2016   Age-related osteoporosis without current pathological fracture 02/21/2016   Hypothyroidism 08/06/2013   Other and unspecified hyperlipidemia 08/06/2013   Insomnia 08/06/2013   Intrinsic asthma 08/06/2013   Hip pain, left 01/12/2011   Plantar fasciitis 01/12/2011    PCP: Pollyann Savoy, MD   REFERRING PROVIDER: Jene Every, MD  REFERRING DIAG: M54.59 (ICD-10-CM) - Other low back pain  Rationale for Evaluation and Treatment: Rehabilitation  THERAPY DIAG:  Pain in left hip  Cramp and spasm  Difficulty in walking, not elsewhere classified  Muscle weakness (generalized)  Other low back pain  ONSET DATE: 08/24/2022  SUBJECTIVE:  SUBJECTIVE STATEMENT: Patient states she had some soreness after last visit but this didn't last.    Pain reported as 1/10.   PERTINENT HISTORY:  Recent   PAIN:  11/12/22 Are you having pain?  Yes, varies throughout the day.  1/10  PRECAUTIONS: None  RED FLAGS: None   WEIGHT BEARING RESTRICTIONS: No  FALLS:  Has patient fallen in last 6 months? Yes. Number of falls tripped over folded up mat going out of a door   OCCUPATION: Retired Publishing rights manager  PLOF: Independent, Independent with basic ADLs, Independent with household mobility without device, Independent with community mobility without  device, Independent with homemaking with ambulation, Independent with gait, and Independent with transfers  PATIENT GOALS: She hopes to get updated instruction and guidance on how to resume her HEP safely and to gain control of her low back pain again to be able to resume prior level of function.  NEXT MD VISIT: prn  OBJECTIVE:   DIAGNOSTIC FINDINGS:  MRI recent:  shows progression of previous issues  PATIENT SURVEYS:  FOTO 57, predicted 31  SCREENING FOR RED FLAGS: Bowel or bladder incontinence: No Spinal tumors: No Cauda equina syndrome: No Compression fracture: No Abdominal aneurysm: No  COGNITION: Overall cognitive status: Within functional limits for tasks assessed     SENSATION: WFL  MUSCLE LENGTH: Patient has continued restriction in bilateral hamstrings to approx 60 degrees on left and 50 degrees on right, positive Thomas test bilaterally  POSTURE:  Obvious acquired scoliosis on flexion    LUMBAR ROM:   AROM eval  Flexion Fingertips to ankles  Extension WNL  Right lateral flexion Fingertips to just above joint line  Left lateral flexion Fingertips to just above joint line  Right rotation WNL  Left rotation WNL   (Blank rows = not tested)  LOWER EXTREMITY ROM:     WFL  LOWER EXTREMITY MMT:    No significant issues or myotomal weaknesses  LUMBAR SPECIAL TESTS:  Thomas test: Positive  FUNCTIONAL TESTS:  5 times sit to stand: 12.13 sec Timed up and go (TUG): 6.38  GAIT: Distance walked: 30 Assistive device utilized: None Level of assistance: Complete Independence Comments: guarded/antalgic  TODAY'S TREATMENT:     DATE: 11/12/22 NuStep L4 x 5' PT present to discuss status Supine hamstring stretch 3 x 30 sec both Supine IT band stretch 3 x 30 sec both PPT x 20 PPT with 90/90 heel taps x 20 PPT with dying bug x 20 PPT with red physioball pass UE/LE 2 x 10 Hook lying lower trunk rotation x 20 Supine PPT with clamshell with red loop x  20 Sidelying clam x 20 each LE with red loop Supine open book x10 each side Side lying reverse clam 2 x 10 with yellow loop Sit to stand hold 5lb yellow loop at knees, PT cued exaggerate knee opening and hip hinge to avoid momentum  DATE: 11/05/22 NuStep L4 x 5' PT present to discuss status Supine hamstring stretch 3 x 30 sec both Supine IT band stretch 3 x 30 sec both PPT x 20 PPT with 90/90 heel taps x 20 PPT with dying bug x 20 Supine PPT with clamshell with red loop x 20 Sidelying clam x 20 each LE with red loop PPT with red physioball pass UE/LE 2 x 10 Supine LTR x 10 Supine open book x10 each side Side lying reverse clam 2 x 10 with yellow loop Sit to stand hold 5lb yellow loop at knees, PT cued exaggerate knee  opening and hip hinge to avoid momentum  DATE: 10/12/22 NuStep L4 x 5' PT present to discuss status Seated clam x 20 with yellow loop  Sit to stand hold 5lb yellow loop at knees, PT cued exaggerate knee opening and hip hinge to avoid momentum Standing hip matrix 40 lbs hip abduction and extension 2 x 10 each  Supine PPT x 20 PPT with 90/90 heel taps x 20 PPT with dying bug x 20 Supine LTR x 10 PPT with red physioball pass UE/LE 2x5 Side lying clam 2 x 10 with yellow loop Side lying reverse clam 2 x 10 with yellow loop Supine open book x10 each side with LE on foam roller  PATIENT EDUCATION:  Education details: Initiated HEP Person educated: Patient Education method: Programmer, multimedia, Facilities manager, Verbal cues, and Handouts Education comprehension: verbalized understanding, returned demonstration, and verbal cues required  HOME EXERCISE PROGRAM: Access Code: [email or web address] URL: [email or web address] Date: 10/06/2022 Prepared by: Loistine Simas Beuhring  Exercises - Trunk Sidebending with Resistance  - 1 x daily - 7 x weekly - 1 sets - 5 reps - 10 hold - Supine Hip Internal and External Rotation  - 1 x daily - 7 x weekly - 3 sets - 10 reps - Standing Hamstring  Stretch on Chair  - 1 x daily - 7 x weekly - 1 sets - 3 reps - 30 sec hold - Quadriceps Stretch with Table  - 1 x daily - 7 x weekly - 1 sets - 10 reps - Seated Figure 4 Piriformis Stretch  - 1 x daily - 7 x weekly - 1 sets - 3 reps - 30 sec hold - Shoulder extension with resistance - Neutral  - 1 x daily - 7 x weekly - 2 sets - 10 reps - Standing Shoulder Row with Anchored Resistance  - 1 x daily - 7 x weekly - 2 sets - 10 reps - Shoulder External Rotation and Scapular Retraction with Resistance  - 1 x daily - 7 x weekly - 2 sets - 10 reps - Supine Posterior Pelvic Tilt  - 1 x daily - 7 x weekly - 1 sets - 20 reps - Supine 90/90 Alternating Heel Touches with Posterior Pelvic Tilt  - 1 x daily - 7 x weekly - 1 sets - 20 reps - Supine Dead Bug with Leg Extension  - 1 x daily - 7 x weekly - 1 sets - 20 reps - Supine 90/90 leg extensions  - 1 x daily - 7 x weekly - 1 sets - 20 reps - Supine Lower Trunk Rotation  - 1 x daily - 7 x weekly - 1 sets - 20 reps - Standing ITB Stretch  - 1 x daily - 7 x weekly - 1 sets - 2 reps - 20-30 hold - Seated Table Piriformis Stretch  - 1 x daily - 7 x weekly - 1 sets - 2 reps - 20-30 hold ASSESSMENT:  CLINICAL IMPRESSION: Jazzmyn is managing her back pain well in the wake of her foot surgery and having to wear a cam boot and "level up" shoe attachment.  She had some soreness from last visit but we had progressed her core work into more challenging activities.  She is compliant with her HEP and well motivated.  She should continue to improve.  She would benefit from continued skilled PT for core stabilization, hip strengthening and LE flexibility exercises.    OBJECTIVE IMPAIRMENTS: difficulty walking, decreased ROM, decreased strength, impaired flexibility, postural dysfunction, and pain.  ACTIVITY LIMITATIONS: carrying, lifting, bending, sitting, standing, squatting, sleeping, and bed mobility  PARTICIPATION LIMITATIONS: meal prep, cleaning, laundry, driving,  shopping, community activity, and yard work  PERSONAL FACTORS: Past/current experiences and 1-2 comorbidities: OA, left great toe pain  are also affecting patient's functional outcome.   REHAB POTENTIAL: Good  CLINICAL DECISION MAKING: Stable/uncomplicated  EVALUATION COMPLEXITY: Low   GOALS: Goals reviewed with patient? Yes  SHORT TERM GOALS: Target date: 10/07/2022   Patient will be independent with initial HEP  Baseline: Goal status: MET  2.  Pain report to be no greater than 4/10  Baseline:  Goal status: MET 10/02/22   LONG TERM GOALS: Target date: 11/04/2022   Patient to be independent with advanced HEP  Baseline:  Goal status: MET 10/08/22  2.  Patient to report pain no greater than 2/10  Baseline:  Goal status: MET 10/08/22  3.  Patient to be able to stand or walk for at least 15 min without leg pain or  Baseline:  Goal status: Progressing  4.  Patient to be able to sleep through the night  Baseline:  Goal status: Progressing  5.  Patient to report 85% improvement in overall symptoms Baseline:  Goal status: Progressing 11/05/22  6.  FOTO score to reach predicted goal Baseline:  Goal status: Progressing 11/05/22  PLAN:  PT FREQUENCY: 1-2x/week  PT DURATION: 8 weeks  PLANNED INTERVENTIONS: Therapeutic exercises, Therapeutic activity, Neuromuscular re-education, Balance training, Gait training, Patient/Family education, Self Care, Joint mobilization, Aquatic Therapy, Dry Needling, Electrical stimulation, Spinal mobilization, Cryotherapy, Moist heat, Taping, Traction, Ultrasound, Manual therapy, and Re-evaluation.  PLAN FOR NEXT SESSION: Continue nustep, progress hip strengthening,  core strengthening.     Victorino Dike B. Gilda Abboud, PT 11/12/22 11:43 AM [facility] [address] [place], [place] [address] Phone # [phone] Fax [phone]

## 2022-11-16 DIAGNOSIS — R110 Nausea: Secondary | ICD-10-CM | POA: Diagnosis not present

## 2022-11-16 DIAGNOSIS — E039 Hypothyroidism, unspecified: Secondary | ICD-10-CM | POA: Diagnosis not present

## 2022-11-16 DIAGNOSIS — M810 Age-related osteoporosis without current pathological fracture: Secondary | ICD-10-CM | POA: Diagnosis not present

## 2022-11-16 DIAGNOSIS — I493 Ventricular premature depolarization: Secondary | ICD-10-CM | POA: Diagnosis not present

## 2022-11-16 DIAGNOSIS — K219 Gastro-esophageal reflux disease without esophagitis: Secondary | ICD-10-CM | POA: Diagnosis not present

## 2022-11-16 DIAGNOSIS — E559 Vitamin D deficiency, unspecified: Secondary | ICD-10-CM | POA: Diagnosis not present

## 2022-11-16 DIAGNOSIS — I2510 Atherosclerotic heart disease of native coronary artery without angina pectoris: Secondary | ICD-10-CM | POA: Diagnosis not present

## 2022-11-16 DIAGNOSIS — Z6826 Body mass index (BMI) 26.0-26.9, adult: Secondary | ICD-10-CM | POA: Diagnosis not present

## 2022-11-16 LAB — LAB REPORT - SCANNED: EGFR: 83

## 2022-11-17 ENCOUNTER — Ambulatory Visit: Payer: Medicare Other

## 2022-11-17 DIAGNOSIS — M25552 Pain in left hip: Secondary | ICD-10-CM | POA: Diagnosis not present

## 2022-11-17 DIAGNOSIS — M5459 Other low back pain: Secondary | ICD-10-CM | POA: Diagnosis not present

## 2022-11-17 DIAGNOSIS — M6281 Muscle weakness (generalized): Secondary | ICD-10-CM

## 2022-11-17 DIAGNOSIS — R252 Cramp and spasm: Secondary | ICD-10-CM

## 2022-11-17 DIAGNOSIS — R262 Difficulty in walking, not elsewhere classified: Secondary | ICD-10-CM

## 2022-11-17 DIAGNOSIS — D2261 Melanocytic nevi of right upper limb, including shoulder: Secondary | ICD-10-CM | POA: Diagnosis not present

## 2022-11-17 DIAGNOSIS — Z85828 Personal history of other malignant neoplasm of skin: Secondary | ICD-10-CM | POA: Diagnosis not present

## 2022-11-17 DIAGNOSIS — L814 Other melanin hyperpigmentation: Secondary | ICD-10-CM | POA: Diagnosis not present

## 2022-11-17 DIAGNOSIS — L821 Other seborrheic keratosis: Secondary | ICD-10-CM | POA: Diagnosis not present

## 2022-11-17 DIAGNOSIS — D2262 Melanocytic nevi of left upper limb, including shoulder: Secondary | ICD-10-CM | POA: Diagnosis not present

## 2022-11-17 DIAGNOSIS — L910 Hypertrophic scar: Secondary | ICD-10-CM | POA: Diagnosis not present

## 2022-11-17 DIAGNOSIS — L570 Actinic keratosis: Secondary | ICD-10-CM | POA: Diagnosis not present

## 2022-11-17 NOTE — Therapy (Signed)
OUTPATIENT PHYSICAL THERAPY THORACOLUMBAR TREATMENT NOTE  Patient Name: Michelle Gray MRN: [id number] DOB:October 29, 1955, 67 y.o., female Today's Date: 11/17/2022  END OF SESSION:  PT End of Session - 11/17/22 1119     Visit Number 12    Date for PT Re-Evaluation 11/23/22    Authorization Type Aetna - Medicare    PT Start Time 1100    PT Stop Time 1145    PT Time Calculation (min) 45 min    Activity Tolerance Patient tolerated treatment well    Behavior During Therapy [facility] for tasks assessed/performed               Past Medical History:  Diagnosis Date   Arthritis    Asthma    Cancer (HCC) 1990   right breast ca-mast with reconstr   Cancer (HCC) 12/2020   left breast DCIS   Eczema    dx by derm per patient   Fibromyalgia    Hyperlipidemia    Hypothyroidism    Neuromuscular disorder (HCC)    Osteoporosis    PONV (postoperative nausea and vomiting)    PVC (premature ventricular contraction)    Thyroid disease    Past Surgical History:  Procedure Laterality Date   APPENDECTOMY     ARTHRODESIS METATARSALPHALANGEAL JOINT (MTPJ) Left 10/15/2022   Procedure: ARTHRODESIS METATARSALPHALANGEAL JOINT (MTPJ);  Surgeon: Toni Arthurs, MD;  Location: [facility];  Service: Orthopedics;  Laterality: Left;   BREAST IMPLANT REMOVAL Right 02/11/2021   Procedure: REMOVAL RIGHT BREAST IMPLANT;  Surgeon: Glenna Fellows, MD;  Location: [facility];  Service: Plastics;  Laterality: Right;   BREAST SURGERY Right 1990   Mastectomy   CAPSULECTOMY Right 02/11/2021   Procedure: CAPSULECTOMY;  Surgeon: Glenna Fellows, MD;  Location: [facility];  Service: Plastics;  Laterality: Right;   FOOT SURGERY     LAPROSCOPIC     TOTAL MASTECTOMY Left 02/11/2021   Procedure: LEFT TOTAL MASTECTOMY;  Surgeon: Emelia Loron, MD;  Location: [facility];  Service: General;  Laterality: Left;   WISDOM TEETH REMOVAL     Patient Active  Problem List   Diagnosis Date Noted   Breast cancer, left breast (HCC) 02/11/2021   Cough variant asthma with ? component upper airway cough syndrome 12/23/2018   Primary osteoarthritis of both hands 02/21/2016   Fibromyalgia 02/21/2016   History of hypothyroidism 02/21/2016   Dyslipidemia 02/21/2016   History of breast cancer 02/21/2016   Age-related osteoporosis without current pathological fracture 02/21/2016   Hypothyroidism 08/06/2013   Other and unspecified hyperlipidemia 08/06/2013   Insomnia 08/06/2013   Intrinsic asthma 08/06/2013   Hip pain, left 01/12/2011   Plantar fasciitis 01/12/2011    PCP: Pollyann Savoy, MD   REFERRING PROVIDER: Jene Every, MD  REFERRING DIAG: M54.59 (ICD-10-CM) - Other low back pain  Rationale for Evaluation and Treatment: Rehabilitation  THERAPY DIAG:  Pain in left hip  Cramp and spasm  Difficulty in walking, not elsewhere classified  Muscle weakness (generalized)  Other low back pain  ONSET DATE: 08/24/2022  SUBJECTIVE:  SUBJECTIVE STATEMENT: Patient states she is having right shoulder pain from doing some shoulder exercises.  Foot has been sore but her low back and hips are still feeling pretty good.    PERTINENT HISTORY:  Recent   PAIN:  11/12/22 Are you having pain?  Yes, varies throughout the day.  1/10  PRECAUTIONS: None  RED FLAGS: None   WEIGHT BEARING RESTRICTIONS: No  FALLS:  Has patient fallen in last 6 months? Yes. Number of falls tripped over folded up mat going out of a door   OCCUPATION: Retired Publishing rights manager  PLOF: Independent, Independent with basic ADLs, Independent with household mobility without device, Independent with community mobility without device, Independent with homemaking with ambulation,  Independent with gait, and Independent with transfers  PATIENT GOALS: She hopes to get updated instruction and guidance on how to resume her HEP safely and to gain control of her low back pain again to be able to resume prior level of function.  NEXT MD VISIT: prn  OBJECTIVE:   DIAGNOSTIC FINDINGS:  MRI recent:  shows progression of previous issues  PATIENT SURVEYS:  FOTO 57, predicted 74  SCREENING FOR RED FLAGS: Bowel or bladder incontinence: No Spinal tumors: No Cauda equina syndrome: No Compression fracture: No Abdominal aneurysm: No  COGNITION: Overall cognitive status: Within functional limits for tasks assessed     SENSATION: WFL  MUSCLE LENGTH: Patient has continued restriction in bilateral hamstrings to approx 60 degrees on left and 50 degrees on right, positive Thomas test bilaterally  POSTURE:  Obvious acquired scoliosis on flexion    LUMBAR ROM:   AROM eval  Flexion Fingertips to ankles  Extension WNL  Right lateral flexion Fingertips to just above joint line  Left lateral flexion Fingertips to just above joint line  Right rotation WNL  Left rotation WNL   (Blank rows = not tested)  LOWER EXTREMITY ROM:     WFL  LOWER EXTREMITY MMT:    No significant issues or myotomal weaknesses  LUMBAR SPECIAL TESTS:  Thomas test: Positive  FUNCTIONAL TESTS:  5 times sit to stand: 12.13 sec Timed up and go (TUG): 6.38  GAIT: Distance walked: 30 Assistive device utilized: None Level of assistance: Complete Independence Comments: guarded/antalgic  TODAY'S TREATMENT:     DATE: 11/17/22 FOTO completed Supine hamstring stretch 2 x 30 sec both Supine IT band stretch 2 x 30 sec both Supine piriformis stretch 2 x 30 sec both Side lying reverse clam 2 x 10 with yellow loop Supine PPT with clamshell with red loop x 20 Sidelying clam x 20 each LE with red loop PPT x 20 PPT with 90/90 heel taps x 20 PPT with dying bug x 20 PPT with red physioball pass  UE/LE 2 x 10 (attempted but patient having right shoulder pain) Hook lying lower trunk rotation x 20 Hook lying knee flexion to extension then eccentric straight leg lowering 2 x 10 each LE Supine open book x10 each side Sit to stand hold 5lb red loop at knees, verbal cues for fwd weight shift  DATE: 11/12/22 NuStep L4 x 5' PT present to discuss status Supine hamstring stretch 3 x 30 sec both Supine IT band stretch 3 x 30 sec both PPT x 20 PPT with 90/90 heel taps x 20 PPT with dying bug x 20 PPT with red physioball pass UE/LE 2 x 10 Hook lying lower trunk rotation x 20 Supine PPT with clamshell with red loop x 20 Sidelying clam x 20 each LE  with red loop Supine open book x10 each side Side lying reverse clam 2 x 10 with yellow loop Sit to stand hold 5lb yellow loop at knees, PT cued exaggerate knee opening and hip hinge to avoid momentum  DATE: 11/05/22 NuStep L4 x 5' PT present to discuss status Supine hamstring stretch 3 x 30 sec both Supine IT band stretch 3 x 30 sec both PPT x 20 PPT with 90/90 heel taps x 20 PPT with dying bug x 20 Supine PPT with clamshell with red loop x 20 Sidelying clam x 20 each LE with red loop PPT with red physioball pass UE/LE 2 x 10 Supine LTR x 10 Supine open book x10 each side Side lying reverse clam 2 x 10 with yellow loop Sit to stand hold 5lb yellow loop at knees, PT cued exaggerate knee opening and hip hinge to avoid momentum  PATIENT EDUCATION:  Education details: Initiated HEP Person educated: Patient Education method: Programmer, multimedia, Facilities manager, Verbal cues, and Handouts Education comprehension: verbalized understanding, returned demonstration, and verbal cues required  HOME EXERCISE PROGRAM: Access Code: [email or web address] URL: [email or web address] Date: 10/06/2022 Prepared by: Loistine Simas Beuhring  Exercises - Trunk Sidebending with Resistance  - 1 x daily - 7 x weekly - 1 sets - 5 reps - 10 hold - Supine Hip Internal and  External Rotation  - 1 x daily - 7 x weekly - 3 sets - 10 reps - Standing Hamstring Stretch on Chair  - 1 x daily - 7 x weekly - 1 sets - 3 reps - 30 sec hold - Quadriceps Stretch with Table  - 1 x daily - 7 x weekly - 1 sets - 10 reps - Seated Figure 4 Piriformis Stretch  - 1 x daily - 7 x weekly - 1 sets - 3 reps - 30 sec hold - Shoulder extension with resistance - Neutral  - 1 x daily - 7 x weekly - 2 sets - 10 reps - Standing Shoulder Row with Anchored Resistance  - 1 x daily - 7 x weekly - 2 sets - 10 reps - Shoulder External Rotation and Scapular Retraction with Resistance  - 1 x daily - 7 x weekly - 2 sets - 10 reps - Supine Posterior Pelvic Tilt  - 1 x daily - 7 x weekly - 1 sets - 20 reps - Supine 90/90 Alternating Heel Touches with Posterior Pelvic Tilt  - 1 x daily - 7 x weekly - 1 sets - 20 reps - Supine Dead Bug with Leg Extension  - 1 x daily - 7 x weekly - 1 sets - 20 reps - Supine 90/90 leg extensions  - 1 x daily - 7 x weekly - 1 sets - 20 reps - Supine Lower Trunk Rotation  - 1 x daily - 7 x weekly - 1 sets - 20 reps - Standing ITB Stretch  - 1 x daily - 7 x weekly - 1 sets - 2 reps - 20-30 hold - Seated Table Piriformis Stretch  - 1 x daily - 7 x weekly - 1 sets - 2 reps - 20-30 hold ASSESSMENT:  CLINICAL IMPRESSION: Sirat is continuing to do well with regard to her back and hip issues.  She developed some right shoulder pain which impeded one of her usual exercises.  She completed all other exercises with ease.    She would benefit from continued skilled PT for core stabilization, hip strengthening and LE flexibility exercises.    OBJECTIVE IMPAIRMENTS: difficulty walking, decreased  ROM, decreased strength, impaired flexibility, postural dysfunction, and pain.   ACTIVITY LIMITATIONS: carrying, lifting, bending, sitting, standing, squatting, sleeping, and bed mobility  PARTICIPATION LIMITATIONS: meal prep, cleaning, laundry, driving, shopping, community activity, and  yard work  PERSONAL FACTORS: Past/current experiences and 1-2 comorbidities: OA, left great toe pain  are also affecting patient's functional outcome.   REHAB POTENTIAL: Good  CLINICAL DECISION MAKING: Stable/uncomplicated  EVALUATION COMPLEXITY: Low   GOALS: Goals reviewed with patient? Yes  SHORT TERM GOALS: Target date: 10/07/2022   Patient will be independent with initial HEP  Baseline: Goal status: MET  2.  Pain report to be no greater than 4/10  Baseline:  Goal status: MET 10/02/22   LONG TERM GOALS: Target date: 11/04/2022   Patient to be independent with advanced HEP  Baseline:  Goal status: MET 10/08/22  2.  Patient to report pain no greater than 2/10  Baseline:  Goal status: MET 10/08/22  3.  Patient to be able to stand or walk for at least 15 min without leg pain or  Baseline:  Goal status: Progressing  4.  Patient to be able to sleep through the night  Baseline:  Goal status: Progressing  5.  Patient to report 85% improvement in overall symptoms Baseline:  Goal status: Progressing 11/05/22  6.  FOTO score to reach predicted goal Baseline:  Goal status: Progressing 11/05/22  PLAN:  PT FREQUENCY: 1-2x/week  PT DURATION: 8 weeks  PLANNED INTERVENTIONS: Therapeutic exercises, Therapeutic activity, Neuromuscular re-education, Balance training, Gait training, Patient/Family education, Self Care, Joint mobilization, Aquatic Therapy, Dry Needling, Electrical stimulation, Spinal mobilization, Cryotherapy, Moist heat, Taping, Traction, Ultrasound, Manual therapy, and Re-evaluation.  PLAN FOR NEXT SESSION: Continue nustep, progress hip strengthening,  core strengthening.     Victorino Dike B. Cherly Erno, PT 11/17/22 3:35 PM [facility] [address] [place], [place] [address] Phone # [phone] Fax [phone]

## 2022-11-19 ENCOUNTER — Other Ambulatory Visit: Payer: Self-pay | Admitting: Family Medicine

## 2022-11-19 ENCOUNTER — Ambulatory Visit: Payer: Medicare Other

## 2022-11-19 DIAGNOSIS — R262 Difficulty in walking, not elsewhere classified: Secondary | ICD-10-CM | POA: Diagnosis not present

## 2022-11-19 DIAGNOSIS — M6281 Muscle weakness (generalized): Secondary | ICD-10-CM

## 2022-11-19 DIAGNOSIS — R252 Cramp and spasm: Secondary | ICD-10-CM

## 2022-11-19 DIAGNOSIS — M25552 Pain in left hip: Secondary | ICD-10-CM | POA: Diagnosis not present

## 2022-11-19 DIAGNOSIS — R1013 Epigastric pain: Secondary | ICD-10-CM

## 2022-11-19 DIAGNOSIS — R110 Nausea: Secondary | ICD-10-CM | POA: Diagnosis not present

## 2022-11-19 DIAGNOSIS — M5459 Other low back pain: Secondary | ICD-10-CM | POA: Diagnosis not present

## 2022-11-19 NOTE — Therapy (Signed)
OUTPATIENT PHYSICAL THERAPY THORACOLUMBAR TREATMENT NOTE  Patient Name: Michelle Gray MRN: [id number] DOB:Mar 06, 1955, 67 y.o., female Today's Date: 11/19/2022  END OF SESSION:  PT End of Session - 11/19/22 1532     Visit Number 13    Date for PT Re-Evaluation 11/23/22    Authorization Type Aetna - Medicare    PT Start Time 1532    Activity Tolerance Patient tolerated treatment well    Behavior During Therapy [facility] for tasks assessed/performed               Past Medical History:  Diagnosis Date   Arthritis    Asthma    Cancer (HCC) 1990   right breast ca-mast with reconstr   Cancer [facility]) 12/2020   left breast DCIS   Eczema    dx by derm per patient   Fibromyalgia    Hyperlipidemia    Hypothyroidism    Neuromuscular disorder (HCC)    Osteoporosis    PONV (postoperative nausea and vomiting)    PVC (premature ventricular contraction)    Thyroid disease    Past Surgical History:  Procedure Laterality Date   APPENDECTOMY     ARTHRODESIS METATARSALPHALANGEAL JOINT (MTPJ) Left 10/15/2022   Procedure: ARTHRODESIS METATARSALPHALANGEAL JOINT (MTPJ);  Surgeon: Toni Arthurs, MD;  Location: [facility];  Service: Orthopedics;  Laterality: Left;   BREAST IMPLANT REMOVAL Right 02/11/2021   Procedure: REMOVAL RIGHT BREAST IMPLANT;  Surgeon: Glenna Fellows, MD;  Location: [facility];  Service: Plastics;  Laterality: Right;   BREAST SURGERY Right 1990   Mastectomy   CAPSULECTOMY Right 02/11/2021   Procedure: CAPSULECTOMY;  Surgeon: Glenna Fellows, MD;  Location: [facility];  Service: Plastics;  Laterality: Right;   FOOT SURGERY     LAPROSCOPIC     TOTAL MASTECTOMY Left 02/11/2021   Procedure: LEFT TOTAL MASTECTOMY;  Surgeon: Emelia Loron, MD;  Location: [facility];  Service: General;  Laterality: Left;   WISDOM TEETH REMOVAL     Patient Active Problem List   Diagnosis Date Noted   Breast cancer, left  breast (HCC) 02/11/2021   Cough variant asthma with ? component upper airway cough syndrome 12/23/2018   Primary osteoarthritis of both hands 02/21/2016   Fibromyalgia 02/21/2016   History of hypothyroidism 02/21/2016   Dyslipidemia 02/21/2016   History of breast cancer 02/21/2016   Age-related osteoporosis without current pathological fracture 02/21/2016   Hypothyroidism 08/06/2013   Other and unspecified hyperlipidemia 08/06/2013   Insomnia 08/06/2013   Intrinsic asthma 08/06/2013   Hip pain, left 01/12/2011   Plantar fasciitis 01/12/2011    PCP: Pollyann Savoy, MD   REFERRING PROVIDER: Jene Every, MD  REFERRING DIAG: M54.59 (ICD-10-CM) - Other low back pain  Rationale for Evaluation and Treatment: Rehabilitation  THERAPY DIAG:  Pain in left hip  Cramp and spasm  Difficulty in walking, not elsewhere classified  Muscle weakness (generalized)  Other low back pain  ONSET DATE: 08/24/2022  SUBJECTIVE:  SUBJECTIVE STATEMENT: Patient states her shoulder is better.  "I've been putting the Voltaren gel on it and not using it much".  Pain level in her back is 3/10.     PERTINENT HISTORY:  Recent   PAIN:  11/12/22 Are you having pain?  Yes, varies throughout the day.  1/10  PRECAUTIONS: None  RED FLAGS: None   WEIGHT BEARING RESTRICTIONS: No  FALLS:  Has patient fallen in last 6 months? Yes. Number of falls tripped over folded up mat going out of a door   OCCUPATION: Retired Publishing rights manager  PLOF: Independent, Independent with basic ADLs, Independent with household mobility without device, Independent with community mobility without device, Independent with homemaking with ambulation, Independent with gait, and Independent with transfers  PATIENT GOALS: She hopes to get  updated instruction and guidance on how to resume her HEP safely and to gain control of her low back pain again to be able to resume prior level of function.  NEXT MD VISIT: prn  OBJECTIVE:   DIAGNOSTIC FINDINGS:  MRI recent:  shows progression of previous issues  PATIENT SURVEYS:  FOTO 57, predicted 7  SCREENING FOR RED FLAGS: Bowel or bladder incontinence: No Spinal tumors: No Cauda equina syndrome: No Compression fracture: No Abdominal aneurysm: No  COGNITION: Overall cognitive status: Within functional limits for tasks assessed     SENSATION: WFL  MUSCLE LENGTH: Patient has continued restriction in bilateral hamstrings to approx 60 degrees on left and 50 degrees on right, positive Thomas test bilaterally  POSTURE:  Obvious acquired scoliosis on flexion    LUMBAR ROM:   AROM eval  Flexion Fingertips to ankles  Extension WNL  Right lateral flexion Fingertips to just above joint line  Left lateral flexion Fingertips to just above joint line  Right rotation WNL  Left rotation WNL   (Blank rows = not tested)  LOWER EXTREMITY ROM:     WFL  LOWER EXTREMITY MMT:    No significant issues or myotomal weaknesses  LUMBAR SPECIAL TESTS:  Thomas test: Positive  FUNCTIONAL TESTS:  5 times sit to stand: 12.13 sec Timed up and go (TUG): 6.38  GAIT: Distance walked: 30 Assistive device utilized: None Level of assistance: Complete Independence Comments: guarded/antalgic  TODAY'S TREATMENT:     DATE: 11/19/22 Nustep x 5 min level 5 Supine hamstring stretch 2 x 30 sec both Supine IT band stretch 2 x 30 sec both Supine piriformis stretch 2 x 30 sec both Side lying reverse clam 2 x 10 with yellow loop Supine PPT with clamshell with red loop x 20 Sidelying clam x 20 each LE with red loop Bridge with clam x 10 with red loop PPT x 20 PPT with 90/90 heel taps x 20 PPT with dying bug x 20 Hook lying knee flexion to extension then eccentric straight leg lowering 2  x 10 each LE Hook lying lower trunk rotation x 20 Supine open book x10 each side Sit to stand hold 5lb red loop at knees 1 x 10 without weight then 2 x 10 with 5 lb kb  DATE: 11/17/22 FOTO completed Supine hamstring stretch 2 x 30 sec both Supine IT band stretch 2 x 30 sec both Supine piriformis stretch 2 x 30 sec both Side lying reverse clam 2 x 10 with yellow loop Supine PPT with clamshell with red loop x 20 Sidelying clam x 20 each LE with red loop PPT x 20 PPT with 90/90 heel taps x 20 PPT with dying bug x  20 PPT with red physioball pass UE/LE 2 x 10 (attempted but patient having right shoulder pain) Hook lying lower trunk rotation x 20 Hook lying knee flexion to extension then eccentric straight leg lowering 2 x 10 each LE Supine open book x10 each side Sit to stand hold 5lb red loop at knees, verbal cues for fwd weight shift  DATE: 11/12/22 NuStep L4 x 5' PT present to discuss status Supine hamstring stretch 3 x 30 sec both Supine IT band stretch 3 x 30 sec both PPT x 20 PPT with 90/90 heel taps x 20 PPT with dying bug x 20 PPT with red physioball pass UE/LE 2 x 10 Hook lying lower trunk rotation x 20 Supine PPT with clamshell with red loop x 20 Sidelying clam x 20 each LE with red loop Supine open book x10 each side Side lying reverse clam 2 x 10 with yellow loop Sit to stand hold 5lb yellow loop at knees, PT cued exaggerate knee opening and hip hinge to avoid momentum  PATIENT EDUCATION:  Education details: Initiated HEP Person educated: Patient Education method: Programmer, multimedia, Facilities manager, Verbal cues, and Handouts Education comprehension: verbalized understanding, returned demonstration, and verbal cues required  HOME EXERCISE PROGRAM: Access Code: [email or web address] URL: [email or web address] Date: 10/06/2022 Prepared by: Loistine Simas Beuhring  Exercises - Trunk Sidebending with Resistance  - 1 x daily - 7 x weekly - 1 sets - 5 reps - 10 hold - Supine Hip  Internal and External Rotation  - 1 x daily - 7 x weekly - 3 sets - 10 reps - Standing Hamstring Stretch on Chair  - 1 x daily - 7 x weekly - 1 sets - 3 reps - 30 sec hold - Quadriceps Stretch with Table  - 1 x daily - 7 x weekly - 1 sets - 10 reps - Seated Figure 4 Piriformis Stretch  - 1 x daily - 7 x weekly - 1 sets - 3 reps - 30 sec hold - Shoulder extension with resistance - Neutral  - 1 x daily - 7 x weekly - 2 sets - 10 reps - Standing Shoulder Row with Anchored Resistance  - 1 x daily - 7 x weekly - 2 sets - 10 reps - Shoulder External Rotation and Scapular Retraction with Resistance  - 1 x daily - 7 x weekly - 2 sets - 10 reps - Supine Posterior Pelvic Tilt  - 1 x daily - 7 x weekly - 1 sets - 20 reps - Supine 90/90 Alternating Heel Touches with Posterior Pelvic Tilt  - 1 x daily - 7 x weekly - 1 sets - 20 reps - Supine Dead Bug with Leg Extension  - 1 x daily - 7 x weekly - 1 sets - 20 reps - Supine 90/90 leg extensions  - 1 x daily - 7 x weekly - 1 sets - 20 reps - Supine Lower Trunk Rotation  - 1 x daily - 7 x weekly - 1 sets - 20 reps - Standing ITB Stretch  - 1 x daily - 7 x weekly - 1 sets - 2 reps - 20-30 hold - Seated Table Piriformis Stretch  - 1 x daily - 7 x weekly - 1 sets - 2 reps - 20-30 hold ASSESSMENT:  CLINICAL IMPRESSION: Kandis is meeting all goals on time and should be ready for DC next visit.  She has experienced some moments of set back due to being in cam boot but has managed to avoid any significant exacerbation  of low back and hip symptoms.  She has one more visit left for ortho PT.  We will likely DC at that time.   She would benefit from continued skilled PT for core stabilization, hip strengthening and LE flexibility exercises.    OBJECTIVE IMPAIRMENTS: difficulty walking, decreased ROM, decreased strength, impaired flexibility, postural dysfunction, and pain.   ACTIVITY LIMITATIONS: carrying, lifting, bending, sitting, standing, squatting, sleeping, and  bed mobility  PARTICIPATION LIMITATIONS: meal prep, cleaning, laundry, driving, shopping, community activity, and yard work  PERSONAL FACTORS: Past/current experiences and 1-2 comorbidities: OA, left great toe pain  are also affecting patient's functional outcome.   REHAB POTENTIAL: Good  CLINICAL DECISION MAKING: Stable/uncomplicated  EVALUATION COMPLEXITY: Low   GOALS: Goals reviewed with patient? Yes  SHORT TERM GOALS: Target date: 10/07/2022   Patient will be independent with initial HEP  Baseline: Goal status: MET  2.  Pain report to be no greater than 4/10  Baseline:  Goal status: MET 10/02/22   LONG TERM GOALS: Target date: 11/04/2022   Patient to be independent with advanced HEP  Baseline:  Goal status: MET 10/08/22  2.  Patient to report pain no greater than 2/10  Baseline:  Goal status: MET 10/08/22  3.  Patient to be able to stand or walk for at least 15 min without leg pain or  Baseline:  Goal status: Progressing  4.  Patient to be able to sleep through the night  Baseline:  Goal status: Progressing  5.  Patient to report 85% improvement in overall symptoms Baseline:  Goal status: Progressing 11/05/22  6.  FOTO score to reach predicted goal Baseline:  Goal status: Progressing 11/05/22  PLAN:  PT FREQUENCY: 1-2x/week  PT DURATION: 8 weeks  PLANNED INTERVENTIONS: Therapeutic exercises, Therapeutic activity, Neuromuscular re-education, Balance training, Gait training, Patient/Family education, Self Care, Joint mobilization, Aquatic Therapy, Dry Needling, Electrical stimulation, Spinal mobilization, Cryotherapy, Moist heat, Taping, Traction, Ultrasound, Manual therapy, and Re-evaluation.  PLAN FOR NEXT SESSION: DC next visit from ortho PT.  Continue nustep, progress hip strengthening,  core strengthening.     Victorino Dike B. Gustie Bobb, PT 11/19/22 5:11 PM [facility] [address] [place], [place] [address] Phone #  [phone] Fax [phone]

## 2022-11-20 NOTE — Therapy (Signed)
OUTPATIENT PHYSICAL THERAPY THORACOLUMBAR TREATMENT AND DISCHARGE SUMMARY   Patient Name: Michelle Gray MRN: [id number] DOB:November 08, 1955, 67 y.o., female Today's Date: 11/23/2022  END OF SESSION:  PT End of Session - 11/23/22 0935     Visit Number 14    Date for PT Re-Evaluation 11/23/22    Authorization Type Aetna - Medicare    PT Start Time 0935    PT Stop Time 1016    PT Time Calculation (min) 41 min    Activity Tolerance Patient tolerated treatment well    Behavior During Therapy [facility] for tasks assessed/performed                Past Medical History:  Diagnosis Date   Arthritis    Asthma    Cancer (HCC) 1990   right breast ca-mast with reconstr   Cancer [facility]) 12/2020   left breast DCIS   Eczema    dx by derm per patient   Fibromyalgia    Hyperlipidemia    Hypothyroidism    Neuromuscular disorder (HCC)    Osteoporosis    PONV (postoperative nausea and vomiting)    PVC (premature ventricular contraction)    Thyroid disease    Past Surgical History:  Procedure Laterality Date   APPENDECTOMY     ARTHRODESIS METATARSALPHALANGEAL JOINT (MTPJ) Left 10/15/2022   Procedure: ARTHRODESIS METATARSALPHALANGEAL JOINT (MTPJ);  Surgeon: Toni Arthurs, MD;  Location: [facility];  Service: Orthopedics;  Laterality: Left;   BREAST IMPLANT REMOVAL Right 02/11/2021   Procedure: REMOVAL RIGHT BREAST IMPLANT;  Surgeon: Glenna Fellows, MD;  Location: [facility];  Service: Plastics;  Laterality: Right;   BREAST SURGERY Right 1990   Mastectomy   CAPSULECTOMY Right 02/11/2021   Procedure: CAPSULECTOMY;  Surgeon: Glenna Fellows, MD;  Location: [facility];  Service: Plastics;  Laterality: Right;   FOOT SURGERY     LAPROSCOPIC     TOTAL MASTECTOMY Left 02/11/2021   Procedure: LEFT TOTAL MASTECTOMY;  Surgeon: Emelia Loron, MD;  [facility];  Service: General;  Laterality: Left;   WISDOM TEETH REMOVAL      Patient Active Problem List   Diagnosis Date Noted   Breast cancer, left breast (HCC) 02/11/2021   Cough variant asthma with ? component upper airway cough syndrome 12/23/2018   Primary osteoarthritis of both hands 02/21/2016   Fibromyalgia 02/21/2016   History of hypothyroidism 02/21/2016   Dyslipidemia 02/21/2016   History of breast cancer 02/21/2016   Age-related osteoporosis without current pathological fracture 02/21/2016   Hypothyroidism 08/06/2013   Other and unspecified hyperlipidemia 08/06/2013   Insomnia 08/06/2013   Intrinsic asthma 08/06/2013   Hip pain, left 01/12/2011   Plantar fasciitis 01/12/2011    PCP: Pollyann Savoy, MD   REFERRING PROVIDER: Jene Every, MD  REFERRING DIAG: M54.59 (ICD-10-CM) - Other low back pain  Rationale for Evaluation and Treatment: Rehabilitation  THERAPY DIAG:  Pain in left hip  Cramp and spasm  Difficulty in walking, not elsewhere classified  Muscle weakness (generalized)  Other low back pain  ONSET DATE: 08/24/2022  SUBJECTIVE:  SUBJECTIVE STATEMENT: My back is much better.  By three o'clock everything kind of hurts more.   PERTINENT HISTORY:  L arthrodesis MTP  10/15/22, L Mastectomy 2023, OP  PAIN:  11/12/22 Are you having pain?  Yes, varies throughout the day.  1/10  PRECAUTIONS: Osteoporosis  RED FLAGS: None   WEIGHT BEARING RESTRICTIONS: No  FALLS:  Has patient fallen in last 6 months? Yes. Number of falls tripped over folded up mat going out of a door   OCCUPATION: Retired Publishing rights manager  PLOF: Independent, Independent with basic ADLs, Independent with household mobility without device, Independent with community mobility without device, Independent with homemaking with ambulation, Independent with gait, and  Independent with transfers  PATIENT GOALS: She hopes to get updated instruction and guidance on how to resume her HEP safely and to gain control of her low back pain again to be able to resume prior level of function.  NEXT MD VISIT: prn  OBJECTIVE:   DIAGNOSTIC FINDINGS:  MRI recent:  shows progression of previous issues  PATIENT SURVEYS:  FOTO 57, predicted 69  11/23/22  70  SCREENING FOR RED FLAGS: Bowel or bladder incontinence: No Spinal tumors: No Cauda equina syndrome: No Compression fracture: No Abdominal aneurysm: No  COGNITION: Overall cognitive status: Within functional limits for tasks assessed     SENSATION: WFL  MUSCLE LENGTH: Patient has continued restriction in bilateral hamstrings to approx 60 degrees on left and 50 degrees on right, positive Thomas test bilaterally  POSTURE:  Obvious acquired scoliosis on flexion    LUMBAR ROM:   AROM eval  Flexion Fingertips to ankles  Extension WNL  Right lateral flexion Fingertips to just above joint line  Left lateral flexion Fingertips to just above joint line  Right rotation WNL  Left rotation WNL   (Blank rows = not tested)  LOWER EXTREMITY ROM:     WFL  LOWER EXTREMITY MMT:    No significant issues or myotomal weaknesses  LUMBAR SPECIAL TESTS:  Thomas test: Positive  FUNCTIONAL TESTS:  5 times sit to stand: 12.13 sec Timed up and go (TUG): 6.38  GAIT: Distance walked: 30 Assistive device utilized: None Level of assistance: Complete Independence Comments: guarded/antalgic  TODAY'S TREATMENT:     DATE:  11/23/22 Nustep x 5 min level 5 FOTO completed; goals assessed Supine PPT with clamshell with red loop x 20 PPT x 20 PPT with 90/90 heel taps x 20 PPT with dying bug x 20 advised slowing down holding 3 sec Hook lying knee flexion to extension then eccentric straight leg lowering 4 x 5 each LE Hook lying lower trunk rotation x 20 Supine open book x10 each side Sidelying clam x 20 each  LE with red loop Bridge with clam x 10 with red loop Reverse clam with bolster b/w legs yellow loop x 10 B Sit to stand red loop at knees 1 x 10 without weight then 2 x 10 with 5 lb kb   11/19/22 Nustep x 5 min level 5 Supine hamstring stretch 2 x 30 sec both Supine IT band stretch 2 x 30 sec both Supine piriformis stretch 2 x 30 sec both Side lying reverse clam 2 x 10 with yellow loop Supine PPT with clamshell with red loop x 20 Sidelying clam x 20 each LE with red loop Bridge with clam x 10 with red loop PPT x 20 PPT with 90/90 heel taps x 20 PPT with dying bug x 20 Hook lying knee flexion to extension then  eccentric straight leg lowering 2 x 10 each LE Hook lying lower trunk rotation x 20 Supine open book x10 each side Sit to stand hold 5lb red loop at knees 1 x 10 without weight then 2 x 10 with 5 lb kb  DATE: 11/17/22 FOTO completed Supine hamstring stretch 2 x 30 sec both Supine IT band stretch 2 x 30 sec both Supine piriformis stretch 2 x 30 sec both Side lying reverse clam 2 x 10 with yellow loop Supine PPT with clamshell with red loop x 20 Sidelying clam x 20 each LE with red loop PPT x 20 PPT with 90/90 heel taps x 20 PPT with dying bug x 20 PPT with red physioball pass UE/LE 2 x 10 (attempted but patient having right shoulder pain) Hook lying lower trunk rotation x 20 Hook lying knee flexion to extension then eccentric straight leg lowering 2 x 10 each LE Supine open book x10 each side Sit to stand hold 5lb red loop at knees, verbal cues for fwd weight shift  DATE: 11/12/22 NuStep L4 x 5' PT present to discuss status Supine hamstring stretch 3 x 30 sec both Supine IT band stretch 3 x 30 sec both PPT x 20 PPT with 90/90 heel taps x 20 PPT with dying bug x 20 PPT with red physioball pass UE/LE 2 x 10 Hook lying lower trunk rotation x 20 Supine PPT with clamshell with red loop x 20 Sidelying clam x 20 each LE with red loop Supine open book x10 each  side Side lying reverse clam 2 x 10 with yellow loop Sit to stand hold 5lb yellow loop at knees, PT cued exaggerate knee opening and hip hinge to avoid momentum  PATIENT EDUCATION:  Education details: Initiated HEP Person educated: Patient Education method: Programmer, multimedia, Facilities manager, Verbal cues, and Handouts Education comprehension: verbalized understanding, returned demonstration, and verbal cues required  HOME EXERCISE PROGRAM: Access Code: [email or web address] URL: [email or web address] Date: 11/23/2022 Prepared by: Raynelle Fanning  Exercises - Supine Hip Internal and External Rotation  - 1 x daily - 7 x weekly - 3 sets - 10 reps - Standing Hamstring Stretch on Chair  - 1 x daily - 7 x weekly - 1 sets - 3 reps - 30 sec hold - Quadriceps Stretch with Table  - 1 x daily - 7 x weekly - 1 sets - 10 reps - Seated Figure 4 Piriformis Stretch  - 1 x daily - 7 x weekly - 1 sets - 3 reps - 30 sec hold - Shoulder extension with resistance - Neutral  - 1 x daily - 7 x weekly - 2 sets - 10 reps - Standing Shoulder Row with Anchored Resistance  - 1 x daily - 7 x weekly - 2 sets - 10 reps - Shoulder External Rotation and Scapular Retraction with Resistance  - 1 x daily - 7 x weekly - 2 sets - 10 reps - Supine Posterior Pelvic Tilt  - 1 x daily - 7 x weekly - 1 sets - 20 reps - Supine 90/90 Alternating Heel Touches with Posterior Pelvic Tilt  - 1 x daily - 7 x weekly - 1 sets - 20 reps - Supine Dead Bug with Leg Extension  - 1 x daily - 7 x weekly - 1 sets - 20 reps - Supine 90/90 leg extensions  - 1 x daily - 7 x weekly - 1 sets - 20 reps - Supine Lower Trunk Rotation  - 1 x daily - 7 x weekly -  1 sets - 20 reps - Standing ITB Stretch  - 1 x daily - 7 x weekly - 1 sets - 2 reps - 20-30 hold - Seated Table Piriformis Stretch  - 1 x daily - 7 x weekly - 1 sets - 2 reps - 20-30 hold - Sidelying Open Book  - 2 x daily - 7 x weekly - 1 sets - 5 reps - 5 sec hold - Supine Bridge with Resistance Band  - 1 x  daily - 3-4 x weekly - 1-3 sets - 10 reps - Bridge with Hip Abduction and Resistance  - 1 x daily - 3 x weekly - 1-3 sets - 10 reps - Sidelying Reverse Clamshell with Resistance  - 1 x daily - 3-4 x weekly - 1-3 sets - 10 reps - Sit to Stand with Resistance Around Legs  - 1 x daily - 3-4 x weekly - 1-3 sets - 10 reps  ASSESSMENT:  CLINICAL IMPRESSION: Rushia has met all of her goals and is ready for discharge. She continues to have some back pain by late afternoon, but pain is significantly better. She has not been able to assess her full function such as walking endurance secondary to recent foot surgery and CAM boot. She is pleased with her current functional level and is independent with her HEP.      OBJECTIVE IMPAIRMENTS: difficulty walking, decreased ROM, decreased strength, impaired flexibility, postural dysfunction, and pain.   ACTIVITY LIMITATIONS: carrying, lifting, bending, sitting, standing, squatting, sleeping, and bed mobility  PARTICIPATION LIMITATIONS: meal prep, cleaning, laundry, driving, shopping, community activity, and yard work  PERSONAL FACTORS: Past/current experiences and 1-2 comorbidities: OA, left great toe pain  are also affecting patient's functional outcome.   REHAB POTENTIAL: Good  CLINICAL DECISION MAKING: Stable/uncomplicated  EVALUATION COMPLEXITY: Low   GOALS: Goals reviewed with patient? Yes  SHORT TERM GOALS: Target date: 10/07/2022   Patient will be independent with initial HEP  Baseline: Goal status: MET  2.  Pain report to be no greater than 4/10  Baseline:  Goal status: MET 10/02/22   LONG TERM GOALS: Target date: 11/04/2022   Patient to be independent with advanced HEP  Baseline:  Goal status: MET 10/08/22  2.  Patient to report pain no greater than 2/10  Baseline:  Goal status: MET 10/08/22  3.  Patient to be able to stand or walk for at least 15 min without leg pain or  Baseline:  Goal status: MET  4.  Patient to be able  to sleep through the night  Baseline:  Goal status: MET  5.  Patient to report 85% improvement in overall symptoms Baseline:  Goal status: MET  6.  FOTO score to reach predicted goal Baseline: MET Goal status: Progressing 11/05/22  PLAN:  PT FREQUENCY: 1-2x/week  PT DURATION: 8 weeks  PLANNED INTERVENTIONS: Therapeutic exercises, Therapeutic activity, Neuromuscular re-education, Balance training, Gait training, Patient/Family education, Self Care, Joint mobilization, Aquatic Therapy, Dry Needling, Electrical stimulation, Spinal mobilization, Cryotherapy, Moist heat, Taping, Traction, Ultrasound, Manual therapy, and Re-evaluation.  PLAN FOR NEXT SESSION: See d/c summary   Jennifer B. Fields, PT 11/23/22 10:20 AM [facility] [address] [place], [place] [address] Phone # [phone] Fax [phone]  PHYSICAL THERAPY DISCHARGE SUMMARY  Visits from Start of Care: 14  Current functional level related to goals / functional outcomes: See above   Remaining deficits: See above   Education / Equipment: HEP/TBAND   Patient agrees to discharge. Patient  goals were met. Patient is being discharged due to meeting the stated rehab goals.  Solon Palm, PT 11/23/22 10:20 AM

## 2022-11-23 ENCOUNTER — Encounter: Payer: Self-pay | Admitting: Physical Therapy

## 2022-11-23 ENCOUNTER — Ambulatory Visit: Payer: Medicare Other | Admitting: Physical Therapy

## 2022-11-23 DIAGNOSIS — M5459 Other low back pain: Secondary | ICD-10-CM

## 2022-11-23 DIAGNOSIS — Z0000 Encounter for general adult medical examination without abnormal findings: Secondary | ICD-10-CM | POA: Diagnosis not present

## 2022-11-23 DIAGNOSIS — Z1331 Encounter for screening for depression: Secondary | ICD-10-CM | POA: Diagnosis not present

## 2022-11-23 DIAGNOSIS — R262 Difficulty in walking, not elsewhere classified: Secondary | ICD-10-CM

## 2022-11-23 DIAGNOSIS — M25552 Pain in left hip: Secondary | ICD-10-CM | POA: Diagnosis not present

## 2022-11-23 DIAGNOSIS — M19079 Primary osteoarthritis, unspecified ankle and foot: Secondary | ICD-10-CM | POA: Diagnosis not present

## 2022-11-23 DIAGNOSIS — J45909 Unspecified asthma, uncomplicated: Secondary | ICD-10-CM | POA: Diagnosis not present

## 2022-11-23 DIAGNOSIS — M6281 Muscle weakness (generalized): Secondary | ICD-10-CM | POA: Diagnosis not present

## 2022-11-23 DIAGNOSIS — R252 Cramp and spasm: Secondary | ICD-10-CM | POA: Diagnosis not present

## 2022-11-23 DIAGNOSIS — M797 Fibromyalgia: Secondary | ICD-10-CM | POA: Diagnosis not present

## 2022-11-23 DIAGNOSIS — I2510 Atherosclerotic heart disease of native coronary artery without angina pectoris: Secondary | ICD-10-CM | POA: Diagnosis not present

## 2022-11-23 DIAGNOSIS — Z23 Encounter for immunization: Secondary | ICD-10-CM | POA: Diagnosis not present

## 2022-11-23 DIAGNOSIS — Z853 Personal history of malignant neoplasm of breast: Secondary | ICD-10-CM | POA: Diagnosis not present

## 2022-11-23 DIAGNOSIS — E782 Mixed hyperlipidemia: Secondary | ICD-10-CM | POA: Diagnosis not present

## 2022-11-23 DIAGNOSIS — E039 Hypothyroidism, unspecified: Secondary | ICD-10-CM | POA: Diagnosis not present

## 2022-11-23 DIAGNOSIS — R1013 Epigastric pain: Secondary | ICD-10-CM | POA: Diagnosis not present

## 2022-11-27 ENCOUNTER — Other Ambulatory Visit: Payer: Self-pay | Admitting: Rheumatology

## 2022-11-27 NOTE — Telephone Encounter (Signed)
Last Fill: 08/27/2022 (Ambien), 07/28/2022 (Gabapentin)   Next Visit: 01/14/2023  Last Visit: 03/23/2022  Dx: Primary insomnia Fibromyalgia   Current Dose per office note on 03/23/2022: Ambien 10 mg p.o. nightly   Okay to refill Ambien and Gabapentin?

## 2022-11-30 DIAGNOSIS — M2022 Hallux rigidus, left foot: Secondary | ICD-10-CM | POA: Diagnosis not present

## 2022-11-30 DIAGNOSIS — Z4789 Encounter for other orthopedic aftercare: Secondary | ICD-10-CM | POA: Diagnosis not present

## 2022-12-04 ENCOUNTER — Ambulatory Visit
Admission: RE | Admit: 2022-12-04 | Discharge: 2022-12-04 | Disposition: A | Discharge status: Home / Self Care | Payer: Medicare Other | Source: Ambulatory Visit | Attending: Family Medicine | Admitting: Family Medicine

## 2022-12-04 ENCOUNTER — Other Ambulatory Visit: Payer: Medicare Other

## 2022-12-04 DIAGNOSIS — R1013 Epigastric pain: Secondary | ICD-10-CM | POA: Diagnosis not present

## 2022-12-08 DIAGNOSIS — Z23 Encounter for immunization: Secondary | ICD-10-CM | POA: Diagnosis not present

## 2022-12-16 DIAGNOSIS — H40013 Open angle with borderline findings, low risk, bilateral: Secondary | ICD-10-CM | POA: Diagnosis not present

## 2022-12-22 ENCOUNTER — Ambulatory Visit: Payer: Medicare Other | Attending: Obstetrics & Gynecology

## 2022-12-22 DIAGNOSIS — M25552 Pain in left hip: Secondary | ICD-10-CM | POA: Diagnosis not present

## 2022-12-22 DIAGNOSIS — M62838 Other muscle spasm: Secondary | ICD-10-CM | POA: Insufficient documentation

## 2022-12-22 DIAGNOSIS — M5459 Other low back pain: Secondary | ICD-10-CM | POA: Insufficient documentation

## 2022-12-22 DIAGNOSIS — M6281 Muscle weakness (generalized): Secondary | ICD-10-CM | POA: Diagnosis present

## 2022-12-22 DIAGNOSIS — R279 Unspecified lack of coordination: Secondary | ICD-10-CM | POA: Insufficient documentation

## 2022-12-22 DIAGNOSIS — R102 Pelvic and perineal pain: Secondary | ICD-10-CM | POA: Insufficient documentation

## 2022-12-22 NOTE — Therapy (Signed)
OUTPATIENT PHYSICAL THERAPY FEMALE PELVIC EVALUATION   Patient Name: Michelle Gray MRN: [id number] DOB:11-Feb-1955, 67 y.o., female Today's Date: 12/22/2022  END OF SESSION:  PT End of Session - 12/22/22 0932     Visit Number 15    Date for PT Re-Evaluation 02/16/23    Authorization Type [employer] - Medicare    Progress Note Due on Visit 20    PT Start Time 0930    PT Stop Time 1010    PT Time Calculation (min) 40 min    Activity Tolerance Patient tolerated treatment well    Behavior During Therapy [facility] for tasks assessed/performed              Past Medical History:  Diagnosis Date   Arthritis    Asthma    Cancer (HCC) 1990   right breast ca-mast with reconstr   Cancer [facility]) 12/2020   left breast DCIS   Eczema    dx by derm per patient   Fibromyalgia    Hyperlipidemia    Hypothyroidism    Neuromuscular disorder (HCC)    Osteoporosis    PONV (postoperative nausea and vomiting)    PVC (premature ventricular contraction)    Thyroid disease    Past Surgical History:  Procedure Laterality Date   APPENDECTOMY     ARTHRODESIS METATARSALPHALANGEAL JOINT (MTPJ) Left 10/15/2022   Procedure: ARTHRODESIS METATARSALPHALANGEAL JOINT (MTPJ);  Surgeon: Toni Arthurs, MD;  Location: [facility];  Service: Orthopedics;  Laterality: Left;   BREAST IMPLANT REMOVAL Right 02/11/2021   Procedure: REMOVAL RIGHT BREAST IMPLANT;  Surgeon: Glenna Fellows, MD;  Location: [facility];  Service: Plastics;  Laterality: Right;   BREAST SURGERY Right 1990   Mastectomy   CAPSULECTOMY Right 02/11/2021   Procedure: CAPSULECTOMY;  Surgeon: Glenna Fellows, MD;  Location: [facility];  Service: Plastics;  Laterality: Right;   FOOT SURGERY     LAPROSCOPIC     TOTAL MASTECTOMY Left 02/11/2021   Procedure: LEFT TOTAL MASTECTOMY;  Surgeon: Emelia Loron, MD;  Location: [facility];  Service: General;  Laterality: Left;   WISDOM TEETH  REMOVAL     Patient Active Problem List   Diagnosis Date Noted   Breast cancer, left breast (HCC) 02/11/2021   Cough variant asthma with ? component upper airway cough syndrome 12/23/2018   Primary osteoarthritis of both hands 02/21/2016   Fibromyalgia 02/21/2016   History of hypothyroidism 02/21/2016   Dyslipidemia 02/21/2016   History of breast cancer 02/21/2016   Age-related osteoporosis without current pathological fracture 02/21/2016   Hypothyroidism 08/06/2013   Other and unspecified hyperlipidemia 08/06/2013   Insomnia 08/06/2013   Intrinsic asthma 08/06/2013   Hip pain, left 01/12/2011   Plantar fasciitis 01/12/2011    PCP: Gweneth Dimitri, MD  REFERRING PROVIDER: Shea Evans, MD  REFERRING DIAG: R29.898 (ICD-10-CM) - Other symptoms and signs involving the musculoskeletal system  THERAPY DIAG:  Pain in left hip  Unspecified lack of coordination  Other muscle spasm  Pelvic pain  Other low back pain  Muscle weakness (generalized)  Rationale for Evaluation and Treatment: Rehabilitation  ONSET DATE: 6 months   SUBJECTIVE:  SUBJECTIVE STATEMENT: Pt states that PT for her low back and epidural really helped low back. She feels like her pelvic pain is also better, but she still has spot on lower left side that will bother her. She is trying to due PT exercise 2x/week. When she does all of her exercises, she is really sore the next day. Lt great toe fusion 8 weeks ago.   PAIN:  Are you having pain? Yes NPRS scale: 1/10 currently, 5-6/10 as highest Pain location:  pelvic pain, Lt side, wraps around into groin and Lt side of sacrum   Pain type: aching and burning Pain description: intermittent   Aggravating factors: sitting, later in the day Relieving factors: stretches (pelvic  tilts, hamstring stretches), ice  PRECAUTIONS: None  RED FLAGS: None   WEIGHT BEARING RESTRICTIONS: No  FALLS:  Has patient fallen in last 6 months? No  LIVING ENVIRONMENT: Lives with: lives with their family Lives in: House/apartment   OCCUPATION: retired  PLOF: Independent  PATIENT GOALS: decrease Lt hip/pelvic pain  PERTINENT HISTORY:  Appendectomy, double mastectomy/breast cancer  BOWEL MOVEMENT: Pain with bowel movement: No Type of bowel movement:Frequency 1x/day and Strain No Fully empty rectum: Yes: - Leakage: No Pads: No Fiber supplement: No  URINATION: Pain with urination: No Fully empty bladder: Yes: - Stream: Strong Urgency: Yes: occasional Frequency: unsure daily, 1-2x/night Leakage:  none  - she is now having some stress incontinence since foot surgery.  Pads: No  INTERCOURSE: Pain with intercourse:  No pain Ability to have vaginal penetration:  Yes: - Climax: WNL Marinoff Scale: 0/3  PREGNANCY: None  PROLAPSE: none   OBJECTIVE:  12/22/22                             Internal Pelvic Floor burning around superficial pelvic floor layers; pain/tightness in Lt levator ani  Patient confirms identification and approves PT to assess internal pelvic floor and treatment Yes  PELVIC MMT:   MMT eval  Vaginal 4/5, 8 second endurance, 6 repeat contractions with decreasing coordination  (Blank rows = not tested)        TONE: Increased tone in Lt deep pelvic floor  PROLAPSE: WNL  09/28/22: COGNITION: Overall cognitive status: Within functional limits for tasks assessed     SENSATION: Light touch: Appears intact Proprioception: Appears intact  FUNCTIONAL TESTS:  Squat:Rt valgus knee collapse, anterior weight shift Single leg stance: Lt, pelvic collapse on Rt, 5 seconds; Rt stable pelvis >10 seconds  GAIT: Comments: WNL  POSTURE: rounded shoulders, forward head, decreased lumbar lordosis, decreased thoracic kyphosis, anterior pelvic  tilt, and Lt lower thoracolumbar scoliosis  LUMBARAROM/PROM:  A/PROM A/PROM  Eval (% available)  Flexion 75  Extension 100  Right lateral flexion 50  Left lateral flexion 50  Right rotation 50  Left rotation 50   (Blank rows = not tested)  PALPATION:   General  significant Lt chest wall scar tissue that is limiting rib cage mobility; decreased bil lateral rib cage excursion, generalized abdominal tightness likely due to bracing form low back pain and fear of movement                External Perineal Exam WNL                             Internal Pelvic Floor some burning at posterior fourchette; some mild tenderness and increased tension in Lt  levator ani; mild atrophy in Rt levator ani  Patient confirms identification and approves PT to assess internal pelvic floor and treatment Yes  PELVIC MMT:   MMT eval  Vaginal 3/5, 2 second endurance, 6 repeat contractions with decreasing coordination  Internal Anal Sphincter   External Anal Sphincter   Puborectalis   Diastasis Recti WNL  (Blank rows = not tested)        TONE: Slight increase in Rt levator ani  PROLAPSE: WNL  TODAY'S TREATMENT:                                                                                                                              DATE:  12/22/22 RE-EVAL Manual: Pt provides verbal consent for internal vaginal/rectal pelvic floor exam. Internal pelvic floor muscle reassessment vaginally Lt sided superficial and deep pelvic floor muscle release Neuromuscular re-education: Transversus abdominus training with multimodal cues for improved motor control and breath coordination Transversus abdominus isometrics with exhale Modified dead bug 2 x 10   2022-10-02  EVAL  Neuromuscular re-education: Diaphragmatic breathing - beach ball Transversus abdominus training with multimodal cues for improved motor control and breath coordination Pelvic floor contraction training    PATIENT EDUCATION:   Education details: See above Person educated: Patient Education method: Explanation, Demonstration, Tactile cues, Verbal cues, and Handouts Education comprehension: verbalized understanding  HOME EXERCISE PROGRAM: Written handout  ASSESSMENT:  CLINICAL IMPRESSION: Ptdoing well overall since going through PT for her low back and having epidural injection. She is performing her exercises on a regular basis. This is the first pelvic floor PT session she has had since eval 2 months ago. She is having increased stress urinary incontinence since Lt foot surgery; we discussed how this is very normal with decrease in activity in general, allowing for pelvic floor muscle wasting. It should improve as she becomes more active and we start focusing on pelvic floor muscle muscles specifically. She demonstrates good pelvic floor muscle strength and improvements in endurance. We discussed the knack to help with stress incontinence as well. She was able to contract transversus abdominus well with exhale and get pelvic floor muscle involved, but when doing a bigger exercise like dead bug, she is holding breath and increasing abdominal pressure in order to achieve core activation. We discussed modifying exercises so she can get better deep core activation without breath holding in her abdominal strengthening. Believe she has better awareness of this moving forward. Due to the Lt levator ani and superficial pelvic floor muscle tension present, release performed with good improvements. We discussed her return to performing some of this at home. She will continue to benefit from skilled PT intervention in order to improve pain, decrease abdominal and chest scar tissue restriction, and begin/progress functional strengthening program.   OBJECTIVE IMPAIRMENTS: decreased activity tolerance, decreased coordination, decreased endurance, decreased strength, increased fascial restrictions, increased muscle spasms, impaired tone,  postural dysfunction, and pain.   ACTIVITY LIMITATIONS: bending, sitting, squatting,  and locomotion level  PARTICIPATION LIMITATIONS: community activity  PERSONAL FACTORS: 1 comorbidity: medical history   are also affecting patient's functional outcome.   REHAB POTENTIAL: Good  CLINICAL DECISION MAKING: Stable/uncomplicated  EVALUATION COMPLEXITY: Low   GOALS: Goals reviewed with patient? Yes  SHORT TERM GOALS: Target date: 10/26/22 - updated 12/22/22  Pt will be independent with HEP.   Baseline: Goal status: IN PROGRESS  2.  Pt will be independent with diaphragmatic breathing and down training activities in order to improve pelvic floor/abdominal relaxation.  Baseline:  Goal status: IN PROGRESS  3.  Pt will be independent with chest wall scar tissue mobilization techniques and perform at least 2x/week.  Baseline:  Goal status: IN PROGRESS   LONG TERM GOALS: Target date: 11/23/22 - updated 12/22/22  Pt will be independent with advanced HEP.   Baseline:  Goal status: IN PROGRESS  2.  Pt will demonstrate normal pelvic floor muscle tone and A/ROM, able to achieve 4/5 strength with contractions and 10 sec endurance, in order to provide appropriate lumbopelvic support in functional activities.   Baseline:  Goal status: IN PROGRESS  3.  Pt will report no Lt pelvic pain higher than 3/5.  Baseline:  Goal status: IN PROGRESS  4.  Pt will be able to sit for longer than 60 minutes without increase in pelvic pain. Baseline:  Goal status: IN PROGRESS  5.  Pt will be able to perform single leg stance >20 seconds bil without pelvic drop.  Baseline:  Goal status: IN PROGRESS  6.  Pt will increase all impaired lumbar A/ROM by 25% without pain.  Baseline:  Goal status: IN PROGRESS  PLAN:  PT FREQUENCY: 1-2x/week  PT DURATION: 8 weeks  PLANNED INTERVENTIONS: Therapeutic exercises, Therapeutic activity, Neuromuscular re-education, Balance training, Gait training,  Patient/Family education, Self Care, Joint mobilization, Dry Needling, Biofeedback, and Manual therapy  PLAN FOR NEXT SESSION: Continue core training; mobility exercises for pelvic floor; possible dry needling/myofascial release to Lt pelvic girdle (quadruped release)   Julio Alm, PT, DPT11/19/249:32 AM

## 2022-12-30 ENCOUNTER — Other Ambulatory Visit: Payer: Self-pay | Admitting: Rheumatology

## 2022-12-30 DIAGNOSIS — M2022 Hallux rigidus, left foot: Secondary | ICD-10-CM | POA: Diagnosis not present

## 2022-12-30 DIAGNOSIS — Z4789 Encounter for other orthopedic aftercare: Secondary | ICD-10-CM | POA: Diagnosis not present

## 2022-12-30 NOTE — Telephone Encounter (Signed)
Last Fill: 11/27/2022  Next Visit: 01/14/2023   Last Visit: 03/23/2022   Dx: Primary insomnia    Current Dose per office note on 03/23/2022: Ambien 10 mg p.o. nightly    Okay to refill Ambien?

## 2023-01-04 NOTE — Progress Notes (Signed)
Office Visit Note  Patient: Michelle Gray             Date of Birth: 29-Sep-1955           MRN: [id number]             PCP: Gweneth Dimitri, MD Referring: Gweneth Dimitri, MD Visit Date: 01/14/2023 Occupation: @GUAROCC @  Subjective:  Left foot pain  History of Present Illness: Michelle Gray is a 67 y.o. female osteoarthritis, degenerative disc disease and fibromyalgia syndrome.  She states for her lower back she had an epidural injection in August by Dr. Ethelene Hal.  That relieved her lower back pain and radiculopathy.  She underwent left first MTP fusion by Dr. Victorino Dike in September 2024.  She states she is having very slow progression from that.  She was in a boot for about 6 weeks.  She continues to have some numbness over her left fourth and fifth toe.  Dr. Victorino Dike related to to the nerve block which she had for the surgery.  She is having difficulty walking due to numbness in her foot.  The CMC pain has improved since she quit working.  She has intermittent discomfort in the left trochanteric bursa.  She has been doing stretching exercises which were given to her by physical therapy.  Will she continues to take meloxicam 15 mg p.o. daily.  She takes Ambien for 10 mg p.o. at night for insomnia.  She takes methocarbamol rarely for muscle spasms.  Is been taking gabapentin 300 mg p.o. nightly.    Activities of Daily Living:  Patient reports morning stiffness for 5 minutes.   Patient Reports nocturnal pain.  Difficulty dressing/grooming: Denies Difficulty climbing stairs: Reports Difficulty getting out of chair: Denies Difficulty using hands for taps, buttons, cutlery, and/or writing: Denies  Review of Systems  Constitutional:  Positive for fatigue.  HENT:  Positive for mouth dryness. Negative for mouth sores.   Eyes:  Positive for dryness.  Respiratory:  Negative for wheezing.   Cardiovascular:  Negative for chest pain and palpitations.  Gastrointestinal:  Negative for blood in stool,  constipation and diarrhea.  Endocrine: Negative for increased urination.  Genitourinary:  Negative for involuntary urination.  Musculoskeletal:  Positive for joint pain, joint pain, myalgias, muscle weakness, morning stiffness, muscle tenderness and myalgias. Negative for joint swelling.  Skin:  Negative for color change, rash, hair loss and sensitivity to sunlight.  Allergic/Immunologic: Negative for susceptible to infections.  Neurological:  Positive for dizziness and numbness. Negative for headaches.  Hematological:  Negative for swollen glands.  Psychiatric/Behavioral:  Positive for sleep disturbance. Negative for depressed mood. The patient is not nervous/anxious.     PMFS History:  Patient Active Problem List   Diagnosis Date Noted   Breast cancer, left breast (HCC) 02/11/2021   Cough variant asthma with ? component upper airway cough syndrome 12/23/2018   Primary osteoarthritis of both hands 02/21/2016   Fibromyalgia 02/21/2016   History of hypothyroidism 02/21/2016   Dyslipidemia 02/21/2016   History of breast cancer 02/21/2016   Age-related osteoporosis without current pathological fracture 02/21/2016   Hypothyroidism 08/06/2013   Other and unspecified hyperlipidemia 08/06/2013   Insomnia 08/06/2013   Intrinsic asthma 08/06/2013   Hip pain, left 01/12/2011   Plantar fasciitis 01/12/2011    Past Medical History:  Diagnosis Date   Arthritis    Asthma    Cancer [facility]) 1990   right breast ca-mast with reconstr   Cancer [facility]) 12/2020   left breast DCIS  Eczema    dx by derm per patient   Fibromyalgia    Hyperlipidemia    Hypothyroidism    Neuromuscular disorder (HCC)    Osteoporosis    PONV (postoperative nausea and vomiting)    PVC (premature ventricular contraction)    Thyroid disease     Family History  Problem Relation Age of Onset   Cancer Mother    Hyperlipidemia Mother    Hypertension Mother    Stroke Mother    Dementia Mother    Hyperlipidemia Father     Stroke Father    Prostate cancer Father    Cancer Father 31       Prostate   Hypertension Brother    Past Surgical History:  Procedure Laterality Date   APPENDECTOMY     ARTHRODESIS METATARSALPHALANGEAL JOINT (MTPJ) Left 10/15/2022   Procedure: ARTHRODESIS METATARSALPHALANGEAL JOINT (MTPJ);  Surgeon: Toni Arthurs, MD;  Location: [facility];  Service: Orthopedics;  Laterality: Left;   BREAST IMPLANT REMOVAL Right 02/11/2021   Procedure: REMOVAL RIGHT BREAST IMPLANT;  Surgeon: Glenna Fellows, MD;  Location: [facility];  Service: Plastics;  Laterality: Right;   BREAST SURGERY Right 1990   Mastectomy   CAPSULECTOMY Right 02/11/2021   Procedure: CAPSULECTOMY;  Surgeon: Glenna Fellows, MD;  Location: [facility];  Service: Plastics;  Laterality: Right;   FOOT SURGERY     LAPROSCOPIC     TOTAL MASTECTOMY Left 02/11/2021   Procedure: LEFT TOTAL MASTECTOMY;  Surgeon: Emelia Loron, MD;  Location:  [facility];  Service: General;  Laterality: Left;   WISDOM TEETH REMOVAL     Social History   Social History Narrative   Not on file   Immunization History  Administered Date(s) Administered   Influenza-Unspecified 10/04/2018   PFIZER(Purple Top)SARS-COV-2 Vaccination 01/31/2019, 02/21/2019, 11/04/2019, 05/18/2020     Objective: Vital Signs: BP 133/83 (BP Location: Left Arm, Patient Position: Sitting, Cuff Size: Normal)   Pulse 74   Ht 5\' 7"  (1.702 m)   Wt 169 lb 9.6 oz (76.9 kg)   BMI 26.56 kg/m    Physical Exam Vitals and nursing note reviewed.  Constitutional:      Appearance: She is well-developed.  HENT:     Head: Normocephalic and atraumatic.  Eyes:     Conjunctiva/sclera: Conjunctivae normal.  Cardiovascular:     Rate and Rhythm: Normal rate and regular rhythm.     Heart sounds: Normal heart sounds.  Pulmonary:     Effort: Pulmonary effort is normal.     Breath sounds: Normal breath sounds.  Abdominal:      General: Bowel sounds are normal.     Palpations: Abdomen is soft.  Musculoskeletal:     Cervical back: Normal range of motion.  Lymphadenopathy:     Cervical: No cervical adenopathy.  Skin:    General: Skin is warm and dry.     Capillary Refill: Capillary refill takes less than 2 seconds.  Neurological:     Mental Status: She is alert and oriented to person, place, and time.  Psychiatric:        Behavior: Behavior normal.      Musculoskeletal Exam: Cervical spine was in good range of motion.  She had no discomfort with range of motion of her thoracic and lumbar spine.  Shoulders, elbows, wrist, MCPs PIPs and DIPs with good range of motion without any discomfort.  PIP and DIP thickening was noted.  Hip joints in good range of motion.  She had some tenderness over left trochanteric bursa.  Knee joints were in good range of motion without any warmth swelling or effusion.  She had no tenderness over ankles or MTPs.  CDAI Exam: CDAI Score: -- Patient Global: --; Provider Global: -- Swollen: --; Tender: -- Joint Exam 01/14/2023   No joint exam has been documented for this visit   There is currently no information documented on the homunculus. Go to the Rheumatology activity and complete the homunculus joint exam.  Investigation: No additional findings.  Imaging: No results found.  Recent Labs: Lab Results  Component Value Date   WBC 16.8 (H) 08/06/2013   HGB 13.4 08/06/2013   PLT 391 08/06/2013   November 16, 2022 TSH normal, CBC WBC 6.5, hemoglobin 13.5, platelets 328, CMP creatinine 0.78, AST 18, ALT 12, calcium 9.8, lipase 33, vitamin D46.1  Speciality Comments: No specialty comments available.  Procedures:  No procedures performed Allergies: Compazine and Tape   Assessment / Plan:     Visit Diagnoses: Primary osteoarthritis of both hands-patient states her discomfort in her hands has improved a lot since she retired.  She has bilateral DIP thickening.     Trochanteric bursitis of left hip-she continues to have intermittent discomfort in the left trochanteric region.  She is has been doing IT band stretches.  Primary osteoarthritis of both feet-she had been seeing Dr. Victorino Dike for the last few years for left first MTP arthritis.  She underwent left first MTP fusion in September 2024.  She is still having difficulty recovering from the surgery.  She describes numbness in her lateral aspect of the left foot.  She is having difficulty walking.  She has also developed hammertoes in her left foot.  She may benefit from orthotics.  Plantar fasciitis-she has been doing stretching exercises.  Spondylosis of lumbar spine-she continues to have intermittent lower back pain.  Patient had epidural injection in August 2024 by Dr. Ethelene Hal.  She had relief after the epidural injection.  Osteopenia of multiple sites - 01/12/2022 DEXA T-score -2.3, BMD 0.595 left femoral neck (no comparison) right total femur 7% increase left total femur no change, total spine 8% increase.  Raynaud's disease without gangrene-currently not symptomatic.  Other fatigue-she is to have some fatigue due to fibromyalgia.  Primary insomnia -she remains on Ambien 10 mg p.o. nightly.  She is unable to sleep without Ambien.  Fibromyalgia -she has intermittent discomfort from fibromyalgia flares.  She has generalized pain and hyperalgesia.  She is on methocarbamol 750 mg p.o. nightly as needed.  She takes gabapentin 300 mg p.o. nightly.  She was previously on gabapentin 300 mg 2 tablets p.o. nightly.  She states she has reduced the dose.  She takes meloxicam 15 mg p.o. daily.  Her labs have been stable.  She brought labs from November 16, 2022 TSH normal, CBC WBC 6.5, hemoglobin 13.5, platelets 328, CMP creatinine 0.78, AST 18, ALT 12, calcium 9.8, lipase 33, vitamin D46.1.  Labs were reviewed with the patient.  She was advised to get labs every 6 months while she is on meloxicam.  History of  breast cancer - Ductal carcinoma in situ.ER/PR+, Status post left mastectomy February 11, 2021.CTX 1990, No RTX.  History of hyperlipidemia  History of hypothyroidism  Orders: No orders of the defined types were placed in this encounter.  Meds ordered this encounter  Medications   meloxicam (MOBIC) 15 MG tablet    Sig: Take 1 tablet (15 mg total) by mouth daily.  Dispense:  90 tablet    Refill:  0    Face-to-face time spent with patient was 40 minutes. Greater than 50% of time was spent in counseling and coordination of care.  Follow-Up Instructions: Return in about 6 months (around 07/15/2023) for Osteoarthritis.   Pollyann Savoy, MD  Note - This record has been created using Animal nutritionist.  Chart creation errors have been sought, but may not always  have been located. Such creation errors do not reflect on  the standard of medical care.

## 2023-01-05 ENCOUNTER — Ambulatory Visit: Payer: Medicare Other | Attending: Obstetrics & Gynecology

## 2023-01-05 DIAGNOSIS — M62838 Other muscle spasm: Secondary | ICD-10-CM | POA: Diagnosis not present

## 2023-01-05 DIAGNOSIS — M5459 Other low back pain: Secondary | ICD-10-CM | POA: Diagnosis not present

## 2023-01-05 DIAGNOSIS — Z124 Encounter for screening for malignant neoplasm of cervix: Secondary | ICD-10-CM | POA: Diagnosis not present

## 2023-01-05 DIAGNOSIS — M6281 Muscle weakness (generalized): Secondary | ICD-10-CM | POA: Diagnosis not present

## 2023-01-05 DIAGNOSIS — N952 Postmenopausal atrophic vaginitis: Secondary | ICD-10-CM | POA: Diagnosis not present

## 2023-01-05 DIAGNOSIS — R279 Unspecified lack of coordination: Secondary | ICD-10-CM | POA: Diagnosis not present

## 2023-01-05 DIAGNOSIS — R102 Pelvic and perineal pain unspecified side: Secondary | ICD-10-CM

## 2023-01-05 DIAGNOSIS — Z779 Other contact with and (suspected) exposures hazardous to health: Secondary | ICD-10-CM | POA: Diagnosis not present

## 2023-01-05 DIAGNOSIS — M25552 Pain in left hip: Secondary | ICD-10-CM | POA: Diagnosis not present

## 2023-01-05 DIAGNOSIS — Z01411 Encounter for gynecological examination (general) (routine) with abnormal findings: Secondary | ICD-10-CM | POA: Diagnosis not present

## 2023-01-05 DIAGNOSIS — Z01419 Encounter for gynecological examination (general) (routine) without abnormal findings: Secondary | ICD-10-CM | POA: Diagnosis not present

## 2023-01-05 DIAGNOSIS — B3731 Acute candidiasis of vulva and vagina: Secondary | ICD-10-CM | POA: Diagnosis not present

## 2023-01-05 NOTE — Patient Instructions (Signed)

## 2023-01-05 NOTE — Therapy (Signed)
OUTPATIENT PHYSICAL THERAPY FEMALE PELVIC EVALUATION   Patient Name: Michelle Gray MRN: [id number] DOB:1955-06-09, 67 y.o., female Today's Date: 01/05/2023  END OF SESSION:  PT End of Session - 01/05/23 1149     Visit Number 16    Date for PT Re-Evaluation 02/16/23    Authorization Type [employer] - Medicare    Progress Note Due on Visit 20    PT Start Time 1145    PT Stop Time 1225    PT Time Calculation (min) 40 min    Activity Tolerance Patient tolerated treatment well    Behavior During Therapy [facility] for tasks assessed/performed              Past Medical History:  Diagnosis Date   Arthritis    Asthma    Cancer (HCC) 1990   right breast ca-mast with reconstr   Cancer [facility]) 12/2020   left breast DCIS   Eczema    dx by derm per patient   Fibromyalgia    Hyperlipidemia    Hypothyroidism    Neuromuscular disorder (HCC)    Osteoporosis    PONV (postoperative nausea and vomiting)    PVC (premature ventricular contraction)    Thyroid disease    Past Surgical History:  Procedure Laterality Date   APPENDECTOMY     ARTHRODESIS METATARSALPHALANGEAL JOINT (MTPJ) Left 10/15/2022   Procedure: ARTHRODESIS METATARSALPHALANGEAL JOINT (MTPJ);  Surgeon: Toni Arthurs, MD;  Location: [facility];  Service: Orthopedics;  Laterality: Left;   BREAST IMPLANT REMOVAL Right 02/11/2021   Procedure: REMOVAL RIGHT BREAST IMPLANT;  Surgeon: Glenna Fellows, MD;  Location: [facility];  Service: Plastics;  Laterality: Right;   BREAST SURGERY Right 1990   Mastectomy   CAPSULECTOMY Right 02/11/2021   Procedure: CAPSULECTOMY;  Surgeon: Glenna Fellows, MD;  Location:  SURGERY CENTER;  Service: Plastics;  Laterality: Right;   FOOT SURGERY     LAPROSCOPIC     TOTAL MASTECTOMY Left 02/11/2021   Procedure: LEFT TOTAL MASTECTOMY;  Surgeon: Emelia Loron, MD;  Location:  SURGERY CENTER;  Service: General;  Laterality: Left;   WISDOM TEETH  REMOVAL     Patient Active Problem List   Diagnosis Date Noted   Breast cancer, left breast (HCC) 02/11/2021   Cough variant asthma with ? component upper airway cough syndrome 12/23/2018   Primary osteoarthritis of both hands 02/21/2016   Fibromyalgia 02/21/2016   History of hypothyroidism 02/21/2016   Dyslipidemia 02/21/2016   History of breast cancer 02/21/2016   Age-related osteoporosis without current pathological fracture 02/21/2016   Hypothyroidism 08/06/2013   Other and unspecified hyperlipidemia 08/06/2013   Insomnia 08/06/2013   Intrinsic asthma 08/06/2013   Hip pain, left 01/12/2011   Plantar fasciitis 01/12/2011    PCP: Gweneth Dimitri, MD  REFERRING PROVIDER: Shea Evans, MD  REFERRING DIAG: R29.898 (ICD-10-CM) - Other symptoms and signs involving the musculoskeletal system  THERAPY DIAG:  Pain in left hip  Unspecified lack of coordination  Other muscle spasm  Pelvic pain  Other low back pain  Muscle weakness (generalized)  Rationale for Evaluation and Treatment: Rehabilitation  ONSET DATE: 6 months   SUBJECTIVE:  SUBJECTIVE STATEMENT: Pt states that she has used wand in the last week and discovered that there was still tension on Lt side. She states that she is doing very well right now and not having any Lt sided pain.   PAIN:  Are you having pain? Yes NPRS scale: 0/10 Pain location:  pelvic pain, Lt side, wraps around into groin and Lt side of sacrum   Pain type: aching and burning Pain description: intermittent   Aggravating factors: sitting, later in the day Relieving factors: stretches (pelvic tilts, hamstring stretches), ice  PRECAUTIONS: None  RED FLAGS: None   WEIGHT BEARING RESTRICTIONS: No  FALLS:  Has patient fallen in last 6 months?  No  LIVING ENVIRONMENT: Lives with: lives with their family Lives in: House/apartment   OCCUPATION: retired  PLOF: Independent  PATIENT GOALS: decrease Lt hip/pelvic pain  PERTINENT HISTORY:  Appendectomy, double mastectomy/breast cancer  BOWEL MOVEMENT: Pain with bowel movement: No Type of bowel movement:Frequency 1x/day and Strain No Fully empty rectum: Yes: - Leakage: No Pads: No Fiber supplement: No  URINATION: Pain with urination: No Fully empty bladder: Yes: - Stream: Strong Urgency: Yes: occasional Frequency: unsure daily, 1-2x/night Leakage:  none  - she is now having some stress incontinence since foot surgery.  Pads: No  INTERCOURSE: Pain with intercourse:  No pain Ability to have vaginal penetration:  Yes: - Climax: WNL Marinoff Scale: 0/3  PREGNANCY: None  PROLAPSE: none   OBJECTIVE:  12/22/22                             Internal Pelvic Floor burning around superficial pelvic floor layers; pain/tightness in Lt levator ani  Patient confirms identification and approves PT to assess internal pelvic floor and treatment Yes  PELVIC MMT:   MMT eval  Vaginal 4/5, 8 second endurance, 6 repeat contractions with decreasing coordination  (Blank rows = not tested)        TONE: Increased tone in Lt deep pelvic floor  PROLAPSE: WNL  09/28/22: COGNITION: Overall cognitive status: Within functional limits for tasks assessed     SENSATION: Light touch: Appears intact Proprioception: Appears intact  FUNCTIONAL TESTS:  Squat:Rt valgus knee collapse, anterior weight shift Single leg stance: Lt, pelvic collapse on Rt, 5 seconds; Rt stable pelvis >10 seconds  GAIT: Comments: WNL  POSTURE: rounded shoulders, forward head, decreased lumbar lordosis, decreased thoracic kyphosis, anterior pelvic tilt, and Lt lower thoracolumbar scoliosis  LUMBARAROM/PROM:  A/PROM A/PROM  Eval (% available)  Flexion 75  Extension 100  Right lateral flexion 50   Left lateral flexion 50  Right rotation 50  Left rotation 50   (Blank rows = not tested)  PALPATION:   General  significant Lt chest wall scar tissue that is limiting rib cage mobility; decreased bil lateral rib cage excursion, generalized abdominal tightness likely due to bracing form low back pain and fear of movement                External Perineal Exam WNL                             Internal Pelvic Floor some burning at posterior fourchette; some mild tenderness and increased tension in Lt levator ani; mild atrophy in Rt levator ani  Patient confirms identification and approves PT to assess internal pelvic floor and treatment Yes  PELVIC MMT:   MMT eval  Vaginal 3/5, 2 second endurance, 6 repeat contractions with decreasing coordination  Internal Anal Sphincter   External Anal Sphincter   Puborectalis   Diastasis Recti WNL  (Blank rows = not tested)        TONE: Slight increase in Rt levator ani  PROLAPSE: WNL  TODAY'S TREATMENT:                                                                                                                              DATE:  01/05/23 Manual: Trigger Point Dry-Needling  Treatment instructions: Expect mild to moderate muscle soreness. S/S of pneumothorax if dry needled over a lung field, and to seek immediate medical attention should they occur. Patient verbalized understanding of these instructions and education.  Patient Consent Given: Yes Education handout provided: Yes Muscles treated: Lt glutes and bil L4 multifidi  Electrical stimulation performed: No Parameters: N/A Treatment response/outcome: twitch response/release Soft tissue mobilization to Lt glutes and bil lumbar paraspinals Neuromuscular re-education: Supine 90/90 toe taps 2 x 10 Supine 90/90 lower trunk rotation 2 x 10 Supine full shoulder flexion weight drop 2 x 10 5 lbs  Clam shells with UE ball press 2 x 10 bil   12/22/22 RE-EVAL Manual: Pt provides  verbal consent for internal vaginal/rectal pelvic floor exam. Internal pelvic floor muscle reassessment vaginally Lt sided superficial and deep pelvic floor muscle release Neuromuscular re-education: Transversus abdominus training with multimodal cues for improved motor control and breath coordination Transversus abdominus isometrics with exhale Modified dead bug 2 x 10   10-20-2022  EVAL  Neuromuscular re-education: Diaphragmatic breathing - beach ball Transversus abdominus training with multimodal cues for improved motor control and breath coordination Pelvic floor contraction training    PATIENT EDUCATION:  Education details: See above Person educated: Patient Education method: Explanation, Demonstration, Tactile cues, Verbal cues, and Handouts Education comprehension: verbalized understanding  HOME EXERCISE PROGRAM: Written handout  ASSESSMENT:  CLINICAL IMPRESSION: Pt doing very well and is having improvement in Lt sided pelvic pain. We focused externally today on trigger points in Lt glutes and piriformis with notable twitch response from dry needling. Further improvement in restriction with soft tissue mobilization. She tolerated core strengthening progressions well, but has some difficulty with bearing down and out vs drawing in with appropriate pressure management. Proprioception is improving with multimodal cues. She will continue to benefit from skilled PT intervention in order to improve pain, decrease abdominal and chest scar tissue restriction, and begin/progress functional strengthening program.   OBJECTIVE IMPAIRMENTS: decreased activity tolerance, decreased coordination, decreased endurance, decreased strength, increased fascial restrictions, increased muscle spasms, impaired tone, postural dysfunction, and pain.   ACTIVITY LIMITATIONS: bending, sitting, squatting, and locomotion level  PARTICIPATION LIMITATIONS: community activity  PERSONAL FACTORS: 1 comorbidity:  medical history   are also affecting patient's functional outcome.   REHAB POTENTIAL: Good  CLINICAL DECISION MAKING: Stable/uncomplicated  EVALUATION COMPLEXITY: Low   GOALS: Goals reviewed with patient? Yes  SHORT TERM GOALS:  Target date: 10/26/22 - updated 12/22/22  Pt will be independent with HEP.   Baseline: Goal status: IN PROGRESS  2.  Pt will be independent with diaphragmatic breathing and down training activities in order to improve pelvic floor/abdominal relaxation.  Baseline:  Goal status: IN PROGRESS  3.  Pt will be independent with chest wall scar tissue mobilization techniques and perform at least 2x/week.  Baseline:  Goal status: IN PROGRESS   LONG TERM GOALS: Target date: 11/23/22 - updated 12/22/22  Pt will be independent with advanced HEP.   Baseline:  Goal status: IN PROGRESS  2.  Pt will demonstrate normal pelvic floor muscle tone and A/ROM, able to achieve 4/5 strength with contractions and 10 sec endurance, in order to provide appropriate lumbopelvic support in functional activities.   Baseline:  Goal status: IN PROGRESS  3.  Pt will report no Lt pelvic pain higher than 3/5.  Baseline:  Goal status: IN PROGRESS  4.  Pt will be able to sit for longer than 60 minutes without increase in pelvic pain. Baseline:  Goal status: IN PROGRESS  5.  Pt will be able to perform single leg stance >20 seconds bil without pelvic drop.  Baseline:  Goal status: IN PROGRESS  6.  Pt will increase all impaired lumbar A/ROM by 25% without pain.  Baseline:  Goal status: IN PROGRESS  PLAN:  PT FREQUENCY: 1-2x/week  PT DURATION: 8 weeks  PLANNED INTERVENTIONS: Therapeutic exercises, Therapeutic activity, Neuromuscular re-education, Balance training, Gait training, Patient/Family education, Self Care, Joint mobilization, Dry Needling, Biofeedback, and Manual therapy  PLAN FOR NEXT SESSION: Continue core training; mobility exercises for pelvic floor;  possible dry needling/myofascial release to Lt pelvic girdle    Julio Alm, PT, DPT12/04/2410:30 PM

## 2023-01-12 ENCOUNTER — Ambulatory Visit: Payer: Medicare Other

## 2023-01-12 NOTE — Therapy (Unsigned)
OUTPATIENT PHYSICAL THERAPY FEMALE PELVIC EVALUATION   Patient Name: Michelle Gray MRN: [id number] DOB:April 07, 1955, 67 y.o., female Today's Date: 01/12/2023  END OF SESSION:     Past Medical History:  Diagnosis Date   Arthritis    Asthma    Cancer (HCC) 1990   right breast ca-mast with reconstr   Cancer [facility]) 12/2020   left breast DCIS   Eczema    dx by derm per patient   Fibromyalgia    Hyperlipidemia    Hypothyroidism    Neuromuscular disorder (HCC)    Osteoporosis    PONV (postoperative nausea and vomiting)    PVC (premature ventricular contraction)    Thyroid disease    Past Surgical History:  Procedure Laterality Date   APPENDECTOMY     ARTHRODESIS METATARSALPHALANGEAL JOINT (MTPJ) Left 10/15/2022   Procedure: ARTHRODESIS METATARSALPHALANGEAL JOINT (MTPJ);  Surgeon: Toni Arthurs, MD;  Location: [facility];  Service: Orthopedics;  Laterality: Left;   BREAST IMPLANT REMOVAL Right 02/11/2021   Procedure: REMOVAL RIGHT BREAST IMPLANT;  Surgeon: Glenna Fellows, MD;  Location: [facility];  Service: Plastics;  Laterality: Right;   BREAST SURGERY Right 1990   Mastectomy   CAPSULECTOMY Right 02/11/2021   Procedure: CAPSULECTOMY;  Surgeon: Glenna Fellows, MD;  Location: [facility];  Service: Plastics;  Laterality: Right;   FOOT SURGERY     LAPROSCOPIC     TOTAL MASTECTOMY Left 02/11/2021   Procedure: LEFT TOTAL MASTECTOMY;  Surgeon: Emelia Loron, MD;  Location: [facility];  Service: General;  Laterality: Left;   WISDOM TEETH REMOVAL     Patient Active Problem List   Diagnosis Date Noted   Breast cancer, left breast (HCC) 02/11/2021   Cough variant asthma with ? component upper airway cough syndrome 12/23/2018   Primary osteoarthritis of both hands 02/21/2016   Fibromyalgia 02/21/2016   History of hypothyroidism 02/21/2016   Dyslipidemia 02/21/2016   History of breast cancer 02/21/2016    Age-related osteoporosis without current pathological fracture 02/21/2016   Hypothyroidism 08/06/2013   Other and unspecified hyperlipidemia 08/06/2013   Insomnia 08/06/2013   Intrinsic asthma 08/06/2013   Hip pain, left 01/12/2011   Plantar fasciitis 01/12/2011    PCP: Gweneth Dimitri, MD  REFERRING PROVIDER: Shea Evans, MD  REFERRING DIAG: R29.898 (ICD-10-CM) - Other symptoms and signs involving the musculoskeletal system  THERAPY DIAG:  No diagnosis found.  Rationale for Evaluation and Treatment: Rehabilitation  ONSET DATE: 6 months   SUBJECTIVE:  SUBJECTIVE STATEMENT: Pt states that she has used wand in the last week and discovered that there was still tension on Lt side. She states that she is doing very well right now and not having any Lt sided pain.   PAIN:  Are you having pain? Yes NPRS scale: 0/10 Pain location:  pelvic pain, Lt side, wraps around into groin and Lt side of sacrum   Pain type: aching and burning Pain description: intermittent   Aggravating factors: sitting, later in the day Relieving factors: stretches (pelvic tilts, hamstring stretches), ice  PRECAUTIONS: None  RED FLAGS: None   WEIGHT BEARING RESTRICTIONS: No  FALLS:  Has patient fallen in last 6 months? No  LIVING ENVIRONMENT: Lives with: lives with their family Lives in: House/apartment   OCCUPATION: retired  PLOF: Independent  PATIENT GOALS: decrease Lt hip/pelvic pain  PERTINENT HISTORY:  Appendectomy, double mastectomy/breast cancer  BOWEL MOVEMENT: Pain with bowel movement: No Type of bowel movement:Frequency 1x/day and Strain No Fully empty rectum: Yes: - Leakage: No Pads: No Fiber supplement: No  URINATION: Pain with urination: No Fully empty bladder: Yes: - Stream:  Strong Urgency: Yes: occasional Frequency: unsure daily, 1-2x/night Leakage:  none  - she is now having some stress incontinence since foot surgery.  Pads: No  INTERCOURSE: Pain with intercourse:  No pain Ability to have vaginal penetration:  Yes: - Climax: WNL Marinoff Scale: 0/3  PREGNANCY: None  PROLAPSE: none   OBJECTIVE:  12/22/22                             Internal Pelvic Floor burning around superficial pelvic floor layers; pain/tightness in Lt levator ani  Patient confirms identification and approves PT to assess internal pelvic floor and treatment Yes  PELVIC MMT:   MMT eval  Vaginal 4/5, 8 second endurance, 6 repeat contractions with decreasing coordination  (Blank rows = not tested)        TONE: Increased tone in Lt deep pelvic floor  PROLAPSE: WNL  09/28/22: COGNITION: Overall cognitive status: Within functional limits for tasks assessed     SENSATION: Light touch: Appears intact Proprioception: Appears intact  FUNCTIONAL TESTS:  Squat:Rt valgus knee collapse, anterior weight shift Single leg stance: Lt, pelvic collapse on Rt, 5 seconds; Rt stable pelvis >10 seconds  GAIT: Comments: WNL  POSTURE: rounded shoulders, forward head, decreased lumbar lordosis, decreased thoracic kyphosis, anterior pelvic tilt, and Lt lower thoracolumbar scoliosis  LUMBARAROM/PROM:  A/PROM A/PROM  Eval (% available)  Flexion 75  Extension 100  Right lateral flexion 50  Left lateral flexion 50  Right rotation 50  Left rotation 50   (Blank rows = not tested)  PALPATION:   General  significant Lt chest wall scar tissue that is limiting rib cage mobility; decreased bil lateral rib cage excursion, generalized abdominal tightness likely due to bracing form low back pain and fear of movement                External Perineal Exam WNL                             Internal Pelvic Floor some burning at posterior fourchette; some mild tenderness and increased tension  in Lt levator ani; mild atrophy in Rt levator ani  Patient confirms identification and approves PT to assess internal pelvic floor and treatment Yes  PELVIC MMT:   MMT eval  Vaginal 3/5, 2 second endurance, 6 repeat contractions with decreasing coordination  Internal Anal Sphincter   External Anal Sphincter   Puborectalis   Diastasis Recti WNL  (Blank rows = not tested)        TONE: Slight increase in Rt levator ani  PROLAPSE: WNL  TODAY'S TREATMENT:                                                                                                                              DATE:  01/12/23 Manual:  Neuromuscular re-education:  Exercises:  Therapeutic activities:    01/05/23 Manual: Trigger Point Dry-Needling  Treatment instructions: Expect mild to moderate muscle soreness. S/S of pneumothorax if dry needled over a lung field, and to seek immediate medical attention should they occur. Patient verbalized understanding of these instructions and education.  Patient Consent Given: Yes Education handout provided: Yes Muscles treated: Lt glutes and bil L4 multifidi  Electrical stimulation performed: No Parameters: N/A Treatment response/outcome: twitch response/release Soft tissue mobilization to Lt glutes and bil lumbar paraspinals Neuromuscular re-education: Supine 90/90 toe taps 2 x 10 Supine 90/90 lower trunk rotation 2 x 10 Supine full shoulder flexion weight drop 2 x 10 5 lbs  Clam shells with UE ball press 2 x 10 bil   12/22/22 RE-EVAL Manual: Pt provides verbal consent for internal vaginal/rectal pelvic floor exam. Internal pelvic floor muscle reassessment vaginally Lt sided superficial and deep pelvic floor muscle release Neuromuscular re-education: Transversus abdominus training with multimodal cues for improved motor control and breath coordination Transversus abdominus isometrics with exhale Modified dead bug 2 x 10   PATIENT EDUCATION:  Education  details: See above Person educated: Patient Education method: Programmer, multimedia, Demonstration, Tactile cues, Verbal cues, and Handouts Education comprehension: verbalized understanding  HOME EXERCISE PROGRAM: Written handout  ASSESSMENT:  CLINICAL IMPRESSION: She will continue to benefit from skilled PT intervention in order to improve pain, decrease abdominal and chest scar tissue restriction, and begin/progress functional strengthening program.   OBJECTIVE IMPAIRMENTS: decreased activity tolerance, decreased coordination, decreased endurance, decreased strength, increased fascial restrictions, increased muscle spasms, impaired tone, postural dysfunction, and pain.   ACTIVITY LIMITATIONS: bending, sitting, squatting, and locomotion level  PARTICIPATION LIMITATIONS: community activity  PERSONAL FACTORS: 1 comorbidity: medical history   are also affecting patient's functional outcome.   REHAB POTENTIAL: Good  CLINICAL DECISION MAKING: Stable/uncomplicated  EVALUATION COMPLEXITY: Low   GOALS: Goals reviewed with patient? Yes  SHORT TERM GOALS: Target date: 10/26/22 - updated 12/22/22  Pt will be independent with HEP.   Baseline: Goal status: IN PROGRESS  2.  Pt will be independent with diaphragmatic breathing and down training activities in order to improve pelvic floor/abdominal relaxation.  Baseline:  Goal status: IN PROGRESS  3.  Pt will be independent with chest wall scar tissue mobilization techniques and perform at least 2x/week.  Baseline:  Goal status: IN PROGRESS   LONG TERM GOALS: Target date: 11/23/22 - updated 12/22/22  Pt  will be independent with advanced HEP.   Baseline:  Goal status: IN PROGRESS  2.  Pt will demonstrate normal pelvic floor muscle tone and A/ROM, able to achieve 4/5 strength with contractions and 10 sec endurance, in order to provide appropriate lumbopelvic support in functional activities.   Baseline:  Goal status: IN PROGRESS  3.   Pt will report no Lt pelvic pain higher than 3/5.  Baseline:  Goal status: IN PROGRESS  4.  Pt will be able to sit for longer than 60 minutes without increase in pelvic pain. Baseline:  Goal status: IN PROGRESS  5.  Pt will be able to perform single leg stance >20 seconds bil without pelvic drop.  Baseline:  Goal status: IN PROGRESS  6.  Pt will increase all impaired lumbar A/ROM by 25% without pain.  Baseline:  Goal status: IN PROGRESS  PLAN:  PT FREQUENCY: 1-2x/week  PT DURATION: 8 weeks  PLANNED INTERVENTIONS: Therapeutic exercises, Therapeutic activity, Neuromuscular re-education, Balance training, Gait training, Patient/Family education, Self Care, Joint mobilization, Dry Needling, Biofeedback, and Manual therapy  PLAN FOR NEXT SESSION: Continue core training; mobility exercises for pelvic floor; possible dry needling/myofascial release to Lt pelvic girdle    Julio Alm, PT, DPT12/10/248:53 AM

## 2023-01-14 ENCOUNTER — Ambulatory Visit: Payer: Medicare Other | Attending: Rheumatology | Admitting: Rheumatology

## 2023-01-14 ENCOUNTER — Encounter: Payer: Self-pay | Admitting: Rheumatology

## 2023-01-14 VITALS — BP 133/83 | HR 74 | Ht 67.0 in | Wt 169.6 lb

## 2023-01-14 DIAGNOSIS — M19072 Primary osteoarthritis, left ankle and foot: Secondary | ICD-10-CM | POA: Diagnosis not present

## 2023-01-14 DIAGNOSIS — R5383 Other fatigue: Secondary | ICD-10-CM | POA: Diagnosis not present

## 2023-01-14 DIAGNOSIS — F5101 Primary insomnia: Secondary | ICD-10-CM | POA: Insufficient documentation

## 2023-01-14 DIAGNOSIS — M8589 Other specified disorders of bone density and structure, multiple sites: Secondary | ICD-10-CM | POA: Diagnosis not present

## 2023-01-14 DIAGNOSIS — M7062 Trochanteric bursitis, left hip: Secondary | ICD-10-CM | POA: Insufficient documentation

## 2023-01-14 DIAGNOSIS — I7300 Raynaud's syndrome without gangrene: Secondary | ICD-10-CM | POA: Diagnosis not present

## 2023-01-14 DIAGNOSIS — M19071 Primary osteoarthritis, right ankle and foot: Secondary | ICD-10-CM | POA: Insufficient documentation

## 2023-01-14 DIAGNOSIS — M797 Fibromyalgia: Secondary | ICD-10-CM | POA: Insufficient documentation

## 2023-01-14 DIAGNOSIS — Z8639 Personal history of other endocrine, nutritional and metabolic disease: Secondary | ICD-10-CM | POA: Insufficient documentation

## 2023-01-14 DIAGNOSIS — M47816 Spondylosis without myelopathy or radiculopathy, lumbar region: Secondary | ICD-10-CM | POA: Insufficient documentation

## 2023-01-14 DIAGNOSIS — M722 Plantar fascial fibromatosis: Secondary | ICD-10-CM | POA: Insufficient documentation

## 2023-01-14 DIAGNOSIS — M19041 Primary osteoarthritis, right hand: Secondary | ICD-10-CM | POA: Insufficient documentation

## 2023-01-14 DIAGNOSIS — M19042 Primary osteoarthritis, left hand: Secondary | ICD-10-CM | POA: Diagnosis not present

## 2023-01-14 DIAGNOSIS — Z853 Personal history of malignant neoplasm of breast: Secondary | ICD-10-CM | POA: Diagnosis not present

## 2023-01-14 MED ORDER — MELOXICAM 15 MG PO TABS: 15.0000 mg | ORAL_TABLET | Freq: Every day | ORAL | 0 refills | Status: DC

## 2023-01-15 NOTE — Progress Notes (Signed)
Michelle Gray reached out to me regarding numbness in her left foot since her surgery 10/15/2022. There have been concerns raised to her that the popliteal nerve block is the likely culprit and her rheumatologist recommended she reach out to me. I apologized for her discomfort, especially if I was indeed the cause or contributed of the injury. We talked at length about her symptoms and, in my opinion, there are multiple possibilities that could explain her symptoms. She asked if a nerve conduction study is warranted. I stated that while it may indicate more clearly where the nerve is injured, it is unlikely to change the treatment plan or the outcome. I said if she would like to know, she could certainly see a neurologist for nerve conduction studies. She describes her sensation as "pins and needles" when she touches the skin ~ 2 in proximal to her lateral malleolus down over her 4th and 5th toes. She denies any weakness or motor deficits. She stated that she wore the boot as instructed, but that when the block wore off ~36 hours after surgery the ace wrap felt like it was cutting off her circulation. She said she had to loosen it multiple times to feel like her foot was being perfused. I described to her the possible mechanisms of injury from proximal to distal: Direct nerve injury by the block needle, pressure injury from the local anesthetic around the nerve, medication toxicity to the nerve, pressure on a nerve by the boot or ace wrap (especially while the block was working), nerve ischemia from the pressure of ace wrap or boot. There are certainly other possibilities, but these were the most likely in my opinion. She stated she has a follow up appointment with Dr. Victorino Dike in early January 2025. I recommended that she discuss nerve conduction studies or other workup at that time. I also said I would reach out again and see how she is doing in the near future, and that she is welcome to contact our office at any time  and I'll reach at ASAP.

## 2023-01-19 ENCOUNTER — Ambulatory Visit: Payer: Medicare Other

## 2023-01-19 DIAGNOSIS — M5459 Other low back pain: Secondary | ICD-10-CM | POA: Diagnosis not present

## 2023-01-19 DIAGNOSIS — R102 Pelvic and perineal pain: Secondary | ICD-10-CM

## 2023-01-19 DIAGNOSIS — M6281 Muscle weakness (generalized): Secondary | ICD-10-CM

## 2023-01-19 DIAGNOSIS — M62838 Other muscle spasm: Secondary | ICD-10-CM

## 2023-01-19 DIAGNOSIS — R279 Unspecified lack of coordination: Secondary | ICD-10-CM

## 2023-01-19 DIAGNOSIS — M25552 Pain in left hip: Secondary | ICD-10-CM

## 2023-01-19 NOTE — Therapy (Signed)
OUTPATIENT PHYSICAL THERAPY FEMALE PELVIC EVALUATION   Patient Name: Michelle Gray MRN: [id number] DOB:[birth year], 67 y.o., female Today's Date: 01/19/2023  END OF SESSION:  PT End of Session - 01/19/23 1103     Visit Number 17    Date for PT Re-Evaluation 02/16/23    Authorization Type [employer] - Medicare    Progress Note Due on Visit 20    PT Start Time 1100    PT Stop Time 1140    PT Time Calculation (min) 40 min    Activity Tolerance Patient tolerated treatment well    Behavior During Therapy [facility] for tasks assessed/performed              Past Medical History:  Diagnosis Date   Arthritis    Asthma    Cancer (HCC) 1990   right breast ca-mast with reconstr   Cancer [facility]) 12/2020   left breast DCIS   Eczema    dx by derm per patient   Fibromyalgia    Hyperlipidemia    Hypothyroidism    Neuromuscular disorder (HCC)    Osteoporosis    PONV (postoperative nausea and vomiting)    PVC (premature ventricular contraction)    Thyroid disease    Past Surgical History:  Procedure Laterality Date   APPENDECTOMY     ARTHRODESIS METATARSALPHALANGEAL JOINT (MTPJ) Left 10/15/2022   Procedure: ARTHRODESIS METATARSALPHALANGEAL JOINT (MTPJ);  Surgeon: Toni Arthurs, MD;  Location: [facility];  Service: Orthopedics;  Laterality: Left;   BREAST IMPLANT REMOVAL Right 02/11/2021   Procedure: REMOVAL RIGHT BREAST IMPLANT;  Surgeon: Glenna Fellows, MD;  Location: [facility];  Service: Plastics;  Laterality: Right;   BREAST SURGERY Right 1990   Mastectomy   CAPSULECTOMY Right 02/11/2021   Procedure: CAPSULECTOMY;  Surgeon: Glenna Fellows, MD;  Location: [facility];  Service: Plastics;  Laterality: Right;   FOOT SURGERY     LAPROSCOPIC     TOTAL MASTECTOMY Left 02/11/2021   Procedure: LEFT TOTAL MASTECTOMY;  Surgeon: Emelia Loron, MD;  Location: [facility];  Service: General;  Laterality: Left;   WISDOM TEETH  REMOVAL     Patient Active Problem List   Diagnosis Date Noted   Breast cancer, left breast (HCC) 02/11/2021   Cough variant asthma with ? component upper airway cough syndrome 12/23/2018   Primary osteoarthritis of both hands 02/21/2016   Fibromyalgia 02/21/2016   History of hypothyroidism 02/21/2016   Dyslipidemia 02/21/2016   History of breast cancer 02/21/2016   Age-related osteoporosis without current pathological fracture 02/21/2016   Hypothyroidism 08/06/2013   Other and unspecified hyperlipidemia 08/06/2013   Insomnia 08/06/2013   Intrinsic asthma 08/06/2013   Hip pain, left 01/12/2011   Plantar fasciitis 01/12/2011    PCP: Gweneth Dimitri, MD  REFERRING PROVIDER: Shea Evans, MD  REFERRING DIAG: R29.898 (ICD-10-CM) - Other symptoms and signs involving the musculoskeletal system  THERAPY DIAG:  Pain in left hip  Unspecified lack of coordination  Other muscle spasm  Pelvic pain  Other low back pain  Muscle weakness (generalized)  Rationale for Evaluation and Treatment: Rehabilitation  ONSET DATE: 6 months   SUBJECTIVE:  SUBJECTIVE STATEMENT: Pt states that she has had a little more pelvic pain. Lt foot continues to bother her.  PAIN:  Are you having pain? Yes NPRS scale: 1/10 Pain location:  pelvic pain, Lt side, wraps around into groin and Lt side of sacrum   Pain type: aching and burning Pain description: intermittent   Aggravating factors: sitting, later in the day Relieving factors: stretches (pelvic tilts, hamstring stretches), ice  PRECAUTIONS: None  RED FLAGS: None   WEIGHT BEARING RESTRICTIONS: No  FALLS:  Has patient fallen in last 6 months? No  LIVING ENVIRONMENT: Lives with: lives with their family Lives in: House/apartment   OCCUPATION:  retired  PLOF: Independent  PATIENT GOALS: decrease Lt hip/pelvic pain  PERTINENT HISTORY:  Appendectomy, double mastectomy/breast cancer  BOWEL MOVEMENT: Pain with bowel movement: No Type of bowel movement:Frequency 1x/day and Strain No Fully empty rectum: Yes: - Leakage: No Pads: No Fiber supplement: No  URINATION: Pain with urination: No Fully empty bladder: Yes: - Stream: Strong Urgency: Yes: occasional Frequency: unsure daily, 1-2x/night Leakage:  none  - she is now having some stress incontinence since foot surgery.  Pads: No  INTERCOURSE: Pain with intercourse:  No pain Ability to have vaginal penetration:  Yes: - Climax: WNL Marinoff Scale: 0/3  PREGNANCY: None  PROLAPSE: none   OBJECTIVE:  12/22/22                             Internal Pelvic Floor burning around superficial pelvic floor layers; pain/tightness in Lt levator ani  Patient confirms identification and approves PT to assess internal pelvic floor and treatment Yes  PELVIC MMT:   MMT eval  Vaginal 4/5, 8 second endurance, 6 repeat contractions with decreasing coordination  (Blank rows = not tested)        TONE: Increased tone in Lt deep pelvic floor  PROLAPSE: WNL  09/28/22: COGNITION: Overall cognitive status: Within functional limits for tasks assessed     SENSATION: Light touch: Appears intact Proprioception: Appears intact  FUNCTIONAL TESTS:  Squat:Rt valgus knee collapse, anterior weight shift Single leg stance: Lt, pelvic collapse on Rt, 5 seconds; Rt stable pelvis >10 seconds  GAIT: Comments: WNL  POSTURE: rounded shoulders, forward head, decreased lumbar lordosis, decreased thoracic kyphosis, anterior pelvic tilt, and Lt lower thoracolumbar scoliosis  LUMBARAROM/PROM:  A/PROM A/PROM  Eval (% available)  Flexion 75  Extension 100  Right lateral flexion 50  Left lateral flexion 50  Right rotation 50  Left rotation 50   (Blank rows = not  tested)  PALPATION:   General  significant Lt chest wall scar tissue that is limiting rib cage mobility; decreased bil lateral rib cage excursion, generalized abdominal tightness likely due to bracing form low back pain and fear of movement                External Perineal Exam WNL                             Internal Pelvic Floor some burning at posterior fourchette; some mild tenderness and increased tension in Lt levator ani; mild atrophy in Rt levator ani  Patient confirms identification and approves PT to assess internal pelvic floor and treatment Yes  PELVIC MMT:   MMT eval  Vaginal 3/5, 2 second endurance, 6 repeat contractions with decreasing coordination  Internal Anal Sphincter   External Anal Sphincter  Puborectalis   Diastasis Recti WNL  (Blank rows = not tested)        TONE: Slight increase in Rt levator ani  PROLAPSE: WNL  TODAY'S TREATMENT:                                                                                                                              DATE:  01/19/23 Manual: Lt foot soft tissue mobilization and scar tissue mobilization Soft tissue mobilization to Lt adductors in Lt side lying Lt obturator internus soft tissue mobilization in Lt side lying with myofascial release surrounding Lt ischial tuberosity Bil lateral sacral release and soft tissue mobilization to glutes  01/05/23 Manual: Trigger Point Dry-Needling  Treatment instructions: Expect mild to moderate muscle soreness. S/S of pneumothorax if dry needled over a lung field, and to seek immediate medical attention should they occur. Patient verbalized understanding of these instructions and education.  Patient Consent Given: Yes Education handout provided: Yes Muscles treated: Lt glutes and bil L4 multifidi  Electrical stimulation performed: No Parameters: N/A Treatment response/outcome: twitch response/release Soft tissue mobilization to Lt glutes and bil lumbar  paraspinals Neuromuscular re-education: Supine 90/90 toe taps 2 x 10 Supine 90/90 lower trunk rotation 2 x 10 Supine full shoulder flexion weight drop 2 x 10 5 lbs  Clam shells with UE ball press 2 x 10 bil   12/22/22 RE-EVAL Manual: Pt provides verbal consent for internal vaginal/rectal pelvic floor exam. Internal pelvic floor muscle reassessment vaginally Lt sided superficial and deep pelvic floor muscle release Neuromuscular re-education: Transversus abdominus training with multimodal cues for improved motor control and breath coordination Transversus abdominus isometrics with exhale Modified dead bug 2 x 10   PATIENT EDUCATION:  Education details: See above Person educated: Patient Education method: Explanation, Demonstration, Tactile cues, Verbal cues, and Handouts Education comprehension: verbalized understanding  HOME EXERCISE PROGRAM: Written handout  ASSESSMENT:  CLINICAL IMPRESSION: Pt still having notable discomfort and issues with Lt foot since great toe fusion. Believe this may be exacerbating LT sided pelvic pain as she is walking with increased Lt LE external rotation and foot supination. Because she is having increased pelvic pain this week, we focused on obturator internus on the Lt side with trigger point release in Lt side lying with ischial tuberosity myofascial release and adductor release. She did very well with this and reported no pain at end of session. We discussed possibly performing dry needling to this area next session. She was encouraged to perform supine Lt LE internal rotation. She will continue to benefit from skilled PT intervention in order to improve pain, decrease abdominal and chest scar tissue restriction, and begin/progress functional strengthening program.   OBJECTIVE IMPAIRMENTS: decreased activity tolerance, decreased coordination, decreased endurance, decreased strength, increased fascial restrictions, increased muscle spasms, impaired  tone, postural dysfunction, and pain.   ACTIVITY LIMITATIONS: bending, sitting, squatting, and locomotion level  PARTICIPATION LIMITATIONS: community activity  PERSONAL FACTORS: 1 comorbidity: medical history  are also affecting patient's functional outcome.   REHAB POTENTIAL: Good  CLINICAL DECISION MAKING: Stable/uncomplicated  EVALUATION COMPLEXITY: Low   GOALS: Goals reviewed with patient? Yes  SHORT TERM GOALS: Target date: 10/26/22 - updated 12/22/22  Pt will be independent with HEP.   Baseline: Goal status: IN PROGRESS  2.  Pt will be independent with diaphragmatic breathing and down training activities in order to improve pelvic floor/abdominal relaxation.  Baseline:  Goal status: IN PROGRESS  3.  Pt will be independent with chest wall scar tissue mobilization techniques and perform at least 2x/week.  Baseline:  Goal status: IN PROGRESS   LONG TERM GOALS: Target date: 11/23/22 - updated 12/22/22  Pt will be independent with advanced HEP.   Baseline:  Goal status: IN PROGRESS  2.  Pt will demonstrate normal pelvic floor muscle tone and A/ROM, able to achieve 4/5 strength with contractions and 10 sec endurance, in order to provide appropriate lumbopelvic support in functional activities.   Baseline:  Goal status: IN PROGRESS  3.  Pt will report no Lt pelvic pain higher than 3/5.  Baseline:  Goal status: IN PROGRESS  4.  Pt will be able to sit for longer than 60 minutes without increase in pelvic pain. Baseline:  Goal status: IN PROGRESS  5.  Pt will be able to perform single leg stance >20 seconds bil without pelvic drop.  Baseline:  Goal status: IN PROGRESS  6.  Pt will increase all impaired lumbar A/ROM by 25% without pain.  Baseline:  Goal status: IN PROGRESS  PLAN:  PT FREQUENCY: 1-2x/week  PT DURATION: 8 weeks  PLANNED INTERVENTIONS: Therapeutic exercises, Therapeutic activity, Neuromuscular re-education, Balance training, Gait  training, Patient/Family education, Self Care, Joint mobilization, Dry Needling, Biofeedback, and Manual therapy  PLAN FOR NEXT SESSION: Continue core training; mobility exercises for pelvic floor; possible dry needling/myofascial release to Lt pelvic girdle    Julio Alm, PT, DPT12/17/2412:15 PM

## 2023-01-28 ENCOUNTER — Other Ambulatory Visit: Payer: Self-pay | Admitting: Rheumatology

## 2023-01-28 NOTE — Telephone Encounter (Signed)
Last Fill: 12/30/2022  Next Visit: 07/15/2023  Last Visit: 01/14/2023  Dx: Primary insomnia    Current Dose per office note on 01/14/2023: Ambien 10 mg p.o. nightly.   Okay to refill Ambien?

## 2023-01-31 NOTE — Progress Notes (Unsigned)
Cardiology Office Note    Date:  02/01/2023  ID:  Marieann, Mullenbach December 12, 1955, MRN [id number] PCP:  Gweneth Dimitri, MD  Cardiologist:  Little Ishikawa, MD  Electrophysiologist:  None   Chief Complaint: Follow up for palpitations   History of Present Illness: .    CONSETTA AKITA is a 67 y.o. female with visit-pertinent history of fibromyalgia, asthma, hyperlipidemia, breast cancer, hypothyroidism and palpitations.  She was initially seen by Dr. Bjorn Pippin in 12/2018 for evaluation of palpitations and chest pain.  Echocardiogram in 12/2018 showed normal LV systolic function, normal RV function, no significant valvular disease.  Cardiac monitor showed no significant arrhythmias, patient triggered events corresponding to sinus rhythm plus or minus PACs.  Coronary CTA done on 01/14/2019 showed nonobstructive CAD with calcified plaque in the proximal LAD causing minimal stenosis and noncalcified plaque in the proximal RCA causing minimal stenosis.  Calcium score was 32, 73 percentile for age and gender.  Zio patch for 7 days in 01/2022 showed 2 episodes of NSVT, longest lasting 5 beats and 45 seconds of SVT with the longest lasting 18.5 seconds and occasional PVCs, 1.7% of beats.  Echocardiogram on 02/20/2022 showed EF 55 to 60%, normal RV function, no significant valvular disease.  She was last seen in clinic on 07/28/2022 by Dr. Bjorn Pippin, she remained stable from a cardiac standpoint.  Her palpitations had significantly improved since increasing diltiazem to 240 mg daily and she had stopped drinking caffeine.  Today she presents for follow-up.  She reports that she she has been doing well.  She reports that she weaned herself off of diltiazem as in September she started experiencing increased nausea and indigestion.  She was started on treatment for indigestion with resolution of symptoms.  She does report some occasional palpitations that sometimes worsens in the afternoon.  She is able to  feel them but they are not overall bothersome, she continues to monitor them on her [facility].  Discussed restarting on Cardizem, she would prefer to wait and watch at this time.  If she feels she needs to restart she will notify the office and we will restart her on 120 mg daily.  She reports that she had a toe fusion in September and has not been exercising as much since but plans to restart.  She does report episodes of low blood pressures in October, after discontinuation of Cardizem, notes improvement after drinking a small bit of Coke.  No further recurrence.  ROS: .   Today she denies chest pain, shortness of breath, lower extremity edema, fatigue, melena, hematuria, hemoptysis, diaphoresis, weakness, presyncope, syncope, orthopnea, and PND.  All other systems are reviewed and otherwise negative. Studies Reviewed: Marland Kitchen    EKG:  EKG is ordered today, personally reviewed, demonstrating  EKG Interpretation Date/Time:  Monday February 01 2023 08:06:36 EST Ventricular Rate:  76 PR Interval:  218 QRS Duration:  96 QT Interval:  416 QTC Calculation: 468 R Axis:   -62  Text Interpretation: Sinus rhythm with 1st degree A-V block with Premature ventricular complexes Incomplete right bundle branch block Left anterior fascicular block Confirmed by Reather Littler [phone]) on 02/01/2023 8:11:09 AM   CV Studies:  Cardiac Studies & Procedures      ECHOCARDIOGRAM  ECHOCARDIOGRAM COMPLETE 02/20/2022  Narrative ECHOCARDIOGRAM REPORT    Patient Name:   ADYSIN SPITALE Date of Exam: 02/20/2022 Medical Rec #:  [id number]        Height:       67.0  in Accession #:    [id number]       Weight:       162.6 lb Date of Birth:  09-16-1955        BSA:          1.852 m Patient Age:    66 years         BP:           134/72 mmHg Patient Gender: F                HR:           67 bpm. Exam Location:  [address]  Procedure: 2D Echo, 3D Echo, Cardiac Doppler and Color Doppler  Indications:    R94.31 Abnormal  EKG  History:        Patient has prior history of Echocardiogram examinations, most recent 12/20/2018. Abnormal ECG; Risk Factors:Dyslipidemia. Palpitations, Breast Cancer (Right- 1990, Left-2022) status post Bilateral Mastectomy.  Sonographer:    Farrel Conners RDCS Referring Phys: EMILY C MONGE  IMPRESSIONS   1. Left ventricular ejection fraction, by estimation, is 55 to 60%. The left ventricle has normal function. The left ventricle has no regional wall motion abnormalities. Left ventricular diastolic parameters were normal. 2. Right ventricular systolic function is normal. The right ventricular size is normal. 3. The mitral valve is abnormal. Trivial mitral valve regurgitation. No evidence of mitral stenosis. 4. The aortic valve is tricuspid. There is mild calcification of the aortic valve. Aortic valve regurgitation is not visualized. Aortic valve sclerosis is present, with no evidence of aortic valve stenosis. 5. The inferior vena cava is normal in size with greater than 50% respiratory variability, suggesting right atrial pressure of 3 mmHg.  FINDINGS Left Ventricle: Left ventricular ejection fraction, by estimation, is 55 to 60%. The left ventricle has normal function. The left ventricle has no regional wall motion abnormalities. The left ventricular internal cavity size was normal in size. There is no left ventricular hypertrophy. Left ventricular diastolic parameters were normal.  Right Ventricle: The right ventricular size is normal. No increase in right ventricular wall thickness. Right ventricular systolic function is normal.  Left Atrium: Left atrial size was normal in size.  Right Atrium: Right atrial size was normal in size.  Pericardium: There is no evidence of pericardial effusion.  Mitral Valve: The mitral valve is abnormal. There is mild thickening of the mitral valve leaflet(s). There is mild calcification of the mitral valve leaflet(s). Trivial mitral valve  regurgitation. No evidence of mitral valve stenosis.  Tricuspid Valve: The tricuspid valve is normal in structure. Tricuspid valve regurgitation is trivial. No evidence of tricuspid stenosis.  Aortic Valve: The aortic valve is tricuspid. There is mild calcification of the aortic valve. Aortic valve regurgitation is not visualized. Aortic valve sclerosis is present, with no evidence of aortic valve stenosis.  Pulmonic Valve: The pulmonic valve was normal in structure. Pulmonic valve regurgitation is not visualized. No evidence of pulmonic stenosis.  Aorta: The aortic root is normal in size and structure.  Venous: The inferior vena cava is normal in size with greater than 50% respiratory variability, suggesting right atrial pressure of 3 mmHg.  IAS/Shunts: No atrial level shunt detected by color flow Doppler.   LEFT VENTRICLE PLAX 2D LVIDd:         4.10 cm   Diastology LVIDs:         2.50 cm   LV e' medial:    9.36 cm/s LV PW:  0.80 cm   LV E/e' medial:  9.7 LV IVS:        0.60 cm   LV e' lateral:   11.90 cm/s LVOT diam:     2.20 cm   LV E/e' lateral: 7.6 LV SV:         67 LV SV Index:   36 LVOT Area:     3.80 cm  3D Volume EF: 3D EF:        66 % LV EDV:       125 ml LV ESV:       42 ml LV SV:        82 ml  RIGHT VENTRICLE RV Basal diam:  3.20 cm RV S prime:     13.10 cm/s TAPSE (M-mode): 3.0 cm  LEFT ATRIUM             Index        RIGHT ATRIUM           Index LA diam:        2.20 cm 1.19 cm/m   RA Area:     12.50 cm LA Vol (A2C):   61.7 ml 33.32 ml/m  RA Volume:   28.30 ml  15.28 ml/m LA Vol (A4C):   68.9 ml 37.20 ml/m LA Biplane Vol: 68.2 ml 36.83 ml/m AORTIC VALVE LVOT Vmax:   69.70 cm/s LVOT Vmean:  47.200 cm/s LVOT VTI:    0.175 m  AORTA Ao Root diam: 3.40 cm Ao Asc diam:  3.30 cm  MITRAL VALVE MV Area (PHT): cm         SHUNTS MV Decel Time: 208 msec    Systemic VTI:  0.18 m MV E velocity: 90.65 cm/s  Systemic Diam: 2.20 cm MV A velocity:  85.65 cm/s MV E/A ratio:  1.06  Charlton Haws MD Electronically signed by Charlton Haws MD Signature Date/Time: 02/20/2022/5:22:37 PM    Final   MONITORS  LONG TERM MONITOR (3-14 DAYS) 01/20/2022  Narrative   2 episodes of NSVT, longest lasting 5 beats   45 episodes of SVT, longest lasting 18.5 seconds with average rate 103 bpm   Occasional PVCs (1.7%)   Patch Wear Time:  7 days and 9 hours (2023-12-08T07:19:01-499 to 2023-12-15T17:04:12-498)  Patient had a min HR of 60 bpm, max HR of 184 bpm, and avg HR of 80 bpm. Predominant underlying rhythm was Sinus Rhythm. First Degree AV Block was present. 2 Ventricular Tachycardia runs occurred, the run with the fastest interval lasting 4 beats with a max rate of 150 bpm, the longest lasting 5 beats with an avg rate of 135 bpm. 45 Supraventricular Tachycardia runs occurred, the run with the fastest interval lasting 13.3 secs with a max rate of 184 bpm, the longest lasting 18.5 secs with an avg rate of 103 bpm. Supraventricular Tachycardia was detected within +/- 45 seconds of symptomatic patient event(s). Isolated SVEs were rare (<1.0%), SVE Couplets were rare (<1.0%), and SVE Triplets were rare (<1.0%). Isolated VEs were occasional (1.7%, 13865), VE Couplets were rare (<1.0%, 148), and VE Triplets were rare (<1.0%, 1). Ventricular Bigeminy was present.  150 patient triggered events, corresponding sinus rhythm  PACs/PVCs, ventricular bigeminy, NSVT  CT SCANS  CT CORONARY MORPH W/CTA COR W/SCORE 01/13/2019  Addendum 01/14/2019  5:20 PM ADDENDUM REPORT: 01/14/2019 17:17  CLINICAL DATA:  39F presents with chest pain  EXAM: Cardiac/Coronary CTA  TECHNIQUE: The patient was scanned on a [employer].  FINDINGS: A 100 kV prospective  scan was triggered in the descending thoracic aorta at 111 HU's. Axial non-contrast 3 mm slices were carried out through the heart. The data set was analyzed on a dedicated work station and scored  using the Agatson method. Gantry rotation speed was 250 msecs and collimation was .6 mm. No beta blockade and 0.8 mg of sl NTG was given. The 3D data set was reconstructed in 5% intervals of the 67-82 % of the R-R cycle. Diastolic phases were analyzed on a dedicated work station using MPR, MIP and VRT modes. The patient received 80 cc of contrast.  Aorta: Normal size  Aortic Valve:  Trileaflet.  No calcifications.  Coronary Arteries:  Normal coronary origin.  Right dominance.  RCA is a large dominant artery that gives rise to PDA and PLA. There is noncalcified plaque in the proximal RCA causing minimal (0-24%) stenosis  Left main is a large artery that gives rise to LAD and LCX arteries.  LAD is a large vessel. There is calcified plaque in the proximal LAD causing minimal (0-24%) stenosis  LCX is a non-dominant artery.  There is no plaque.  Other findings:  Left Ventricle: Normal size  Left Atrium: Normal size  Pulmonary Veins: Normal configuration  Right Ventricle: Normal size  Right Atrium: Normal size  Cardiac valves: No calcifications  Thoracic aorta: Normal size  Pulmonary Arteries: Normal size  Systemic Veins: Normal drainage  Pericardium: Normal thickness  IMPRESSION: 1. Coronary calcium score of 32. This was 67 percentile for age and sex matched control.  2.  Normal coronary origin with right dominance.  3. Nonobstructive CAD, with calcified plaque in the proximal LAD causing minimal (0-24%) stenosis and noncalcified plaque in the proximal RCA causing minimal (0-24%) stenosis  CAD-RADS 1. Minimal non-obstructive CAD (0-24%). Consider non-atherosclerotic causes of chest pain. Consider preventive therapy and risk factor modification.   Electronically Signed By: Epifanio Lesches MD On: 01/14/2019 17:17  Narrative EXAM: OVER-READ INTERPRETATION  CT CHEST  The following report is an over-read performed by radiologist Dr. Trudie Reed of  [facility] on 01/13/2019. This over-read does not include interpretation of cardiac or coronary anatomy or pathology. The coronary calcium score/coronary CTA interpretation by the cardiologist is attached.  COMPARISON:  None.  FINDINGS: Aortic atherosclerosis. Within the visualized portions of the thorax there are no suspicious appearing pulmonary nodules or masses, there is no acute consolidative airspace disease, no pleural effusions, no pneumothorax and no lymphadenopathy. Visualized portions of the upper abdomen are unremarkable. There are no aggressive appearing lytic or blastic lesions noted in the visualized portions of the skeleton. Right-sided breast implant is incidentally noted.  IMPRESSION: 1.  Aortic Atherosclerosis (ICD10-I70.0).  Electronically Signed: By: Trudie Reed M.D. On: 01/13/2019 08:08            Current Reported Medications:.    Current Meds  Medication Sig   5-HTP CAPS Take 2 tablets by mouth at bedtime.   Acetaminophen (TYLENOL PO) Take 1,000 mg by mouth 2 (two) times daily.   albuterol (VENTOLIN HFA) 108 (90 Base) MCG/ACT inhaler Inhale 2 puffs into the lungs every 6 (six) hours as needed for wheezing or shortness of breath.   Biotin 5000 MCG CAPS Take 5,000 mcg by mouth daily.   budesonide-formoterol (SYMBICORT) 160-4.5 MCG/ACT inhaler Inhale 2 puffs into the lungs in the morning and at bedtime.   Cholecalciferol (VITAMIN D3) 5000 units CAPS Take 7,000 Units by mouth once a week.   Coenzyme Q10 (COQ-10) 100 MG CAPS  estradiol (ESTRACE) 0.1 MG/GM vaginal cream Insert 1 g twice a week by vaginal route as directed for 30 days.   ezetimibe (ZETIA) 10 MG tablet TAKE ONE TABLET BY MOUTH DAILY   gabapentin (NEURONTIN) 300 MG capsule TAKE TWO CAPSULES BY MOUTH AT BEDTIME (Patient taking differently: Take 300 mg by mouth at bedtime. Patient stated she is only taking 1 tab at bedtime)   Glucosamine 500 MG TABS Take 1,500 mg by mouth  daily.   levothyroxine (SYNTHROID, LEVOTHROID) 50 MCG tablet 50 mcg 6 days weekly.   levothyroxine (SYNTHROID, LEVOTHROID) 75 MCG tablet Take 75 mcg by mouth once a week.   Magnesium 500 MG CAPS Take 500 mg by mouth daily.   meloxicam (MOBIC) 15 MG tablet Take 1 tablet (15 mg total) by mouth daily.   methocarbamol (ROBAXIN) 750 MG tablet Take 1 tablet (750 mg total) by mouth at bedtime as needed for muscle spasms.   Misc Natural Products (TART CHERRY ADVANCED PO) Take by mouth.   mometasone (ELOCON) 0.1 % cream Apply 1 Application topically daily as needed (As needed).   pantoprazole (PROTONIX) 40 MG tablet SMARTSIG:1.0 Tablet(s) By Mouth Daily   rosuvastatin (CRESTOR) 20 MG tablet TAKE ONE TABLET BY MOUTH DAILY   zolpidem (AMBIEN) 10 MG tablet TAKE 1 TABLET BY MOUTH AT BEDTIME    Physical Exam:    VS:  BP (!) 120/58 (BP Location: Left Arm, Patient Position: Sitting, Cuff Size: Normal)   Pulse 75   Ht 5\' 7"  (1.702 m)   Wt 169 lb (76.7 kg)   SpO2 97%   BMI 26.47 kg/m    Wt Readings from Last 3 Encounters:  02/01/23 169 lb (76.7 kg)  01/14/23 169 lb 9.6 oz (76.9 kg)  11/05/22 167 lb (75.8 kg)    GEN: Well nourished, well developed in no acute distress NECK: No JVD; No carotid bruits CARDIAC: RRR, no murmurs, rubs, gallops RESPIRATORY:  Clear to auscultation without rales, wheezing or rhonchi  ABDOMEN: Soft, non-tender, non-distended EXTREMITIES:  No edema; No acute deformity   Asessement and Plan:.    Palpitations: Zio patch for 7 days in 01/2022 showed 2 episodes of NSVT, longest lasting 5 beats and 45 seconds of SVT with the longest lasting 18.5 seconds and occasional PVCs, 1.7% of beats.  Echocardiogram on 02/20/2022 showed EF 55 to 60%, normal RV function, no significant valvular disease.  Today she reports that she is doing well, she weaned herself off of Cardizem earlier this fall, she questioned if it was causing nausea and indigestion.  She was started on treatment for  indigestion with improvement in symptoms.  She does note that her PVCs have returned but they are not as significant as they previously were.  Discussed restarting Cardizem.  She prefers to monitor her symptoms and if she has worsening PVCs she plans to restart on Cardizem 120 mg daily.  She will notify the office if she feels she needs to restart.  Coronary artery disease: Coronary CTA done on 01/14/2019 showed nonobstructive CAD with calcified plaque in the proximal LAD causing minimal stenosis and noncalcified plaque in the proximal RCA causing minimal stenosis.  Calcium score was 32, 73 percentile for age and gender. Stable with no anginal symptoms. No indication for ischemic evaluation.  Continue rosuvastatin 20 mg daily and Zetia 10 mg daily.  Hyperlipidemia: LDL 61 on 03/17/2022.  Patient with history of myalgias on higher dose rosuvastatin.  Continue rosuvastatin 20 mg daily and Zetia 10 mg daily.  She  plans to have her fasting labs completed with her PCP in April.  If she decides she would like them sooner she will notify the office.   Disposition: F/u with Dr. Bjorn Pippin in six months.   Signed, Rip Harbour, NP

## 2023-02-01 ENCOUNTER — Ambulatory Visit: Payer: Medicare Other | Attending: Cardiology | Admitting: Cardiology

## 2023-02-01 ENCOUNTER — Encounter: Payer: Self-pay | Admitting: Cardiology

## 2023-02-01 VITALS — BP 120/58 | HR 75 | Ht 67.0 in | Wt 169.0 lb

## 2023-02-01 DIAGNOSIS — I2510 Atherosclerotic heart disease of native coronary artery without angina pectoris: Secondary | ICD-10-CM

## 2023-02-01 DIAGNOSIS — E785 Hyperlipidemia, unspecified: Secondary | ICD-10-CM

## 2023-02-01 DIAGNOSIS — R002 Palpitations: Secondary | ICD-10-CM

## 2023-02-01 NOTE — Patient Instructions (Signed)
 Medication Instructions:  No changes *If you need a refill on your cardiac medications before your next appointment, please call your pharmacy*  Lab Work: No labs  Testing/Procedures: No testing  Follow-Up: At [facility], you and your health needs are our priority.  As part of our continuing mission to provide you with exceptional heart care, we have created designated Provider Care Teams.  These Care Teams include your primary Cardiologist (physician) and Advanced Practice Providers (APPs -  Physician Assistants and Nurse Practitioners) who all work together to provide you with the care you need, when you need it.  We recommend signing up for the patient portal called "MyChart".  Sign up information is provided on this After Visit Summary.  MyChart is used to connect with patients for Virtual Visits (Telemedicine).  Patients are able to view lab/test results, encounter notes, upcoming appointments, etc.  Non-urgent messages can be sent to your provider as well.   To learn more about what you can do with MyChart, go to [email or web address].    Your next appointment:   6 month(s)  Provider:   Little Ishikawa, MD

## 2023-02-02 ENCOUNTER — Ambulatory Visit: Payer: Medicare Other

## 2023-02-02 DIAGNOSIS — M25552 Pain in left hip: Secondary | ICD-10-CM | POA: Diagnosis not present

## 2023-02-02 DIAGNOSIS — M5459 Other low back pain: Secondary | ICD-10-CM

## 2023-02-02 DIAGNOSIS — R102 Pelvic and perineal pain unspecified side: Secondary | ICD-10-CM

## 2023-02-02 DIAGNOSIS — M62838 Other muscle spasm: Secondary | ICD-10-CM | POA: Diagnosis not present

## 2023-02-02 DIAGNOSIS — R279 Unspecified lack of coordination: Secondary | ICD-10-CM | POA: Diagnosis not present

## 2023-02-02 DIAGNOSIS — M6281 Muscle weakness (generalized): Secondary | ICD-10-CM

## 2023-02-02 NOTE — Therapy (Signed)
 OUTPATIENT PHYSICAL THERAPY FEMALE PELVIC EVALUATION   Patient Name: Michelle Gray MRN: [id number] DOB:1955/08/17, 67 y.o., female Today's Date: 02/02/2023  END OF SESSION:  PT End of Session - 02/02/23 1102     Visit Number 18    Date for PT Re-Evaluation 02/16/23    Authorization Type [employer] - Medicare    Progress Note Due on Visit 20    PT Start Time 1100    PT Stop Time 1140    PT Time Calculation (min) 40 min    Activity Tolerance Patient tolerated treatment well    Behavior During Therapy [facility] for tasks assessed/performed              Past Medical History:  Diagnosis Date   Arthritis    Asthma    Cancer (HCC) 1990   right breast ca-mast with reconstr   Cancer [facility]) 12/2020   left breast DCIS   Eczema    dx by derm per patient   Fibromyalgia    Hyperlipidemia    Hypothyroidism    Neuromuscular disorder (HCC)    Osteoporosis    PONV (postoperative nausea and vomiting)    PVC (premature ventricular contraction)    Thyroid  disease    Past Surgical History:  Procedure Laterality Date   APPENDECTOMY     ARTHRODESIS METATARSALPHALANGEAL JOINT (MTPJ) Left 10/15/2022   Procedure: ARTHRODESIS METATARSALPHALANGEAL JOINT (MTPJ);  Surgeon: Kit Rush, MD;  Location: [facility];  Service: Orthopedics;  Laterality: Left;   BREAST IMPLANT REMOVAL Right 02/11/2021   Procedure: REMOVAL RIGHT BREAST IMPLANT;  Surgeon: Arelia Filippo, MD;  Location: [facility];  Service: Plastics;  Laterality: Right;   BREAST SURGERY Right 1990   Mastectomy   CAPSULECTOMY Right 02/11/2021   Procedure: CAPSULECTOMY;  Surgeon: Arelia Filippo, MD;  Location: [facility];  Service: Plastics;  Laterality: Right;   FOOT SURGERY     LAPROSCOPIC     TOTAL MASTECTOMY Left 02/11/2021   Procedure: LEFT TOTAL MASTECTOMY;  Surgeon: Ebbie Cough, MD;  Location: [facility];  Service: General;  Laterality: Left;   WISDOM TEETH  REMOVAL     Patient Active Problem List   Diagnosis Date Noted   Breast cancer, left breast (HCC) 02/11/2021   Cough variant asthma with ? component upper airway cough syndrome 12/23/2018   Primary osteoarthritis of both hands 02/21/2016   Fibromyalgia 02/21/2016   History of hypothyroidism 02/21/2016   Dyslipidemia 02/21/2016   History of breast cancer 02/21/2016   Age-related osteoporosis without current pathological fracture 02/21/2016   Hypothyroidism 08/06/2013   Other and unspecified hyperlipidemia 08/06/2013   Insomnia 08/06/2013   Intrinsic asthma 08/06/2013   Hip pain, left 01/12/2011   Plantar fasciitis 01/12/2011    PCP: Aisha Harvey, MD  REFERRING PROVIDER: Barbette Knock, MD  REFERRING DIAG: R29.898 (ICD-10-CM) - Other symptoms and signs involving the musculoskeletal system  THERAPY DIAG:  Pain in left hip  Unspecified lack of coordination  Other muscle spasm  Pelvic pain  Other low back pain  Muscle weakness (generalized)  Rationale for Evaluation and Treatment: Rehabilitation  ONSET DATE: 6 months   SUBJECTIVE:  SUBJECTIVE STATEMENT: Pt states that over the holiday she was not able to perform as many exercises and feels like it has really caused her pain to increase.  PAIN:  Are you having pain? Yes NPRS scale: 3/10 Pain location:  pelvic pain, Lt side, wraps around into groin and Lt side of sacrum   Pain type: aching and burning Pain description: intermittent   Aggravating factors: sitting, later in the day Relieving factors: stretches (pelvic tilts, hamstring stretches), ice  PRECAUTIONS: None  RED FLAGS: None   WEIGHT BEARING RESTRICTIONS: No  FALLS:  Has patient fallen in last 6 months? No  LIVING ENVIRONMENT: Lives with: lives with their  family Lives in: House/apartment   OCCUPATION: retired  PLOF: Independent  PATIENT GOALS: decrease Lt hip/pelvic pain  PERTINENT HISTORY:  Appendectomy, double mastectomy/breast cancer  BOWEL MOVEMENT: Pain with bowel movement: No Type of bowel movement:Frequency 1x/day and Strain No Fully empty rectum: Yes: - Leakage: No Pads: No Fiber supplement: No  URINATION: Pain with urination: No Fully empty bladder: Yes: - Stream: Strong Urgency: Yes: occasional Frequency: unsure daily, 1-2x/night Leakage:  none  - she is now having some stress incontinence since foot surgery.  Pads: No  INTERCOURSE: Pain with intercourse:  No pain Ability to have vaginal penetration:  Yes: - Climax: WNL Marinoff Scale: 0/3  PREGNANCY: None  PROLAPSE: none   OBJECTIVE:  12/22/22                             Internal Pelvic Floor burning around superficial pelvic floor layers; pain/tightness in Lt levator ani  Patient confirms identification and approves PT to assess internal pelvic floor and treatment Yes  PELVIC MMT:   MMT eval  Vaginal 4/5, 8 second endurance, 6 repeat contractions with decreasing coordination  (Blank rows = not tested)        TONE: Increased tone in Lt deep pelvic floor  PROLAPSE: WNL  09/28/22: COGNITION: Overall cognitive status: Within functional limits for tasks assessed     SENSATION: Light touch: Appears intact Proprioception: Appears intact  FUNCTIONAL TESTS:  Squat:Rt valgus knee collapse, anterior weight shift Single leg stance: Lt, pelvic collapse on Rt, 5 seconds; Rt stable pelvis >10 seconds  GAIT: Comments: WNL  POSTURE: rounded shoulders, forward head, decreased lumbar lordosis, decreased thoracic kyphosis, anterior pelvic tilt, and Lt lower thoracolumbar scoliosis  LUMBARAROM/PROM:  A/PROM A/PROM  Eval (% available)  Flexion 75  Extension 100  Right lateral flexion 50  Left lateral flexion 50  Right rotation 50  Left  rotation 50   (Blank rows = not tested)  PALPATION:   General  significant Lt chest wall scar tissue that is limiting rib cage mobility; decreased bil lateral rib cage excursion, generalized abdominal tightness likely due to bracing form low back pain and fear of movement                External Perineal Exam WNL                             Internal Pelvic Floor some burning at posterior fourchette; some mild tenderness and increased tension in Lt levator ani; mild atrophy in Rt levator ani  Patient confirms identification and approves PT to assess internal pelvic floor and treatment Yes  PELVIC MMT:   MMT eval  Vaginal 3/5, 2 second endurance, 6 repeat contractions with decreasing coordination  Internal  Anal Sphincter   External Anal Sphincter   Puborectalis   Diastasis Recti WNL  (Blank rows = not tested)        TONE: Slight increase in Rt levator ani  PROLAPSE: WNL  TODAY'S TREATMENT:                                                                                                                              DATE:  02/02/23 Manual: Trigger Point Dry-Needling  Treatment instructions: Expect mild to moderate muscle soreness. S/S of pneumothorax if dry needled over a lung field, and to seek immediate medical attention should they occur. Patient verbalized understanding of these instructions and education.  Patient Consent Given: Yes Education handout provided: Previously provided Muscles treated: Lt glutes and piriformis  Electrical stimulation performed: No Parameters: N/A Treatment response/outcome: twitch response and release Soft tissue mobilization to Lt glutes External Lt obturator trigger point release in Rt side lying  Soft tissue mobilization to Lt adductors in Rt side lying Exercises: Side lying hip abduction 2 x 10 bil Side lying hip adduction 2 x 10 bil Seated hip adduction 2 x 10 ball squeeze Seated hip abduction red band with transversus abdominus and  pelvic floor muscle 2 x 10 Seated hip internal rotation with red band 2 x 10  01/19/23 Manual: Lt foot soft tissue mobilization and scar tissue mobilization Soft tissue mobilization to Lt adductors in Lt side lying Lt obturator internus soft tissue mobilization in Lt side lying with myofascial release surrounding Lt ischial tuberosity Bil lateral sacral release and soft tissue mobilization to glutes  01/05/23 Manual: Trigger Point Dry-Needling  Treatment instructions: Expect mild to moderate muscle soreness. S/S of pneumothorax if dry needled over a lung field, and to seek immediate medical attention should they occur. Patient verbalized understanding of these instructions and education.  Patient Consent Given: Yes Education handout provided: Yes Muscles treated: Lt glutes and bil L4 multifidi  Electrical stimulation performed: No Parameters: N/A Treatment response/outcome: twitch response/release Soft tissue mobilization to Lt glutes and bil lumbar paraspinals Neuromuscular re-education: Supine 90/90 toe taps 2 x 10 Supine 90/90 lower trunk rotation 2 x 10 Supine full shoulder flexion weight drop 2 x 10 5 lbs  Clam shells with UE ball press 2 x 10 bil   PATIENT EDUCATION:  Education details: See above Person educated: Patient Education method: Programmer, Multimedia, Demonstration, Tactile cues, Verbal cues, and Handouts Education comprehension: verbalized understanding  HOME EXERCISE PROGRAM: Written handout  ASSESSMENT:  CLINICAL IMPRESSION: Pt has had a difficult week with pain increase due to being unable to perform exercises on a regular basis over the holiday. She is otherwise doing better with foot and feels like there is progress there. Due to increase in pain, dry needling performed to Lt piriformis and glutes with large twitch response. She had notable trigger points in LT obturator internus, but tolerated release well with good improvements. She did well with exercise  progressions today with  no increase in pain. She will continue to benefit from skilled PT intervention in order to improve pain, decrease abdominal and chest scar tissue restriction, and begin/progress functional strengthening program.   OBJECTIVE IMPAIRMENTS: decreased activity tolerance, decreased coordination, decreased endurance, decreased strength, increased fascial restrictions, increased muscle spasms, impaired tone, postural dysfunction, and pain.   ACTIVITY LIMITATIONS: bending, sitting, squatting, and locomotion level  PARTICIPATION LIMITATIONS: community activity  PERSONAL FACTORS: 1 comorbidity: medical history   are also affecting patient's functional outcome.   REHAB POTENTIAL: Good  CLINICAL DECISION MAKING: Stable/uncomplicated  EVALUATION COMPLEXITY: Low   GOALS: Goals reviewed with patient? Yes  SHORT TERM GOALS: Target date: 10/26/22 - updated 12/22/22  Pt will be independent with HEP.   Baseline: Goal status: MET 02/02/23  2.  Pt will be independent with diaphragmatic breathing and down training activities in order to improve pelvic floor/abdominal relaxation.  Baseline:  Goal status: MET 02/02/23  3.  Pt will be independent with chest wall scar tissue mobilization techniques and perform at least 2x/week.  Baseline:  Goal status: IN PROGRESS 02/02/23   LONG TERM GOALS: Target date: 11/23/22 - updated 12/22/22 - updated 02/02/23  Pt will be independent with advanced HEP.   Baseline:  Goal status: IN PROGRESS 02/02/23  2.  Pt will demonstrate normal pelvic floor muscle tone and A/ROM, able to achieve 4/5 strength with contractions and 10 sec endurance, in order to provide appropriate lumbopelvic support in functional activities.   Baseline:  Goal status: IN PROGRESS 02/02/23  3.  Pt will report no Lt pelvic pain higher than 3/5.  Baseline:  Goal status: IN PROGRESS 02/02/23  4.  Pt will be able to sit for longer than 60 minutes without increase in  pelvic pain. Baseline:  Goal status: IN PROGRESS 02/02/23  5.  Pt will be able to perform single leg stance >20 seconds bil without pelvic drop.  Baseline:  Goal status: IN PROGRESS 02/02/23  6.  Pt will increase all impaired lumbar A/ROM by 25% without pain.  Baseline:  Goal status: IN PROGRESS 02/02/23  PLAN:  PT FREQUENCY: 1-2x/week  PT DURATION: 8 weeks  PLANNED INTERVENTIONS: Therapeutic exercises, Therapeutic activity, Neuromuscular re-education, Balance training, Gait training, Patient/Family education, Self Care, Joint mobilization, Dry Needling, Biofeedback, and Manual therapy  PLAN FOR NEXT SESSION: Continue core training; mobility exercises for pelvic floor; possible dry needling/myofascial release to Lt pelvic girdle    Josette Mares, PT, DPT12/31/2411:41 AM

## 2023-02-10 DIAGNOSIS — M2042 Other hammer toe(s) (acquired), left foot: Secondary | ICD-10-CM | POA: Diagnosis not present

## 2023-02-10 DIAGNOSIS — M2022 Hallux rigidus, left foot: Secondary | ICD-10-CM | POA: Diagnosis not present

## 2023-02-10 DIAGNOSIS — G5782 Other specified mononeuropathies of left lower limb: Secondary | ICD-10-CM | POA: Diagnosis not present

## 2023-02-23 ENCOUNTER — Other Ambulatory Visit: Payer: Self-pay

## 2023-02-23 MED ORDER — ZOLPIDEM TARTRATE 10 MG PO TABS: 10.0000 mg | ORAL_TABLET | Freq: Every day | ORAL | 0 refills | Status: DC

## 2023-02-23 NOTE — Telephone Encounter (Signed)
Patient contacted the office requesting a refill of Ambien be sent to [facility] on [address]  Last Fill: 01/28/2023  Next Visit: 07/15/2023  Last Visit: 01/14/2023  Dx: Primary insomnia   Current Dose per office note on 01/14/2023: Ambien 10 mg p.o. nightly   Okay to refill Ambien?

## 2023-02-24 ENCOUNTER — Ambulatory Visit: Payer: Medicare Other | Attending: Obstetrics & Gynecology

## 2023-02-24 DIAGNOSIS — M25552 Pain in left hip: Secondary | ICD-10-CM | POA: Diagnosis not present

## 2023-02-24 DIAGNOSIS — M5459 Other low back pain: Secondary | ICD-10-CM | POA: Insufficient documentation

## 2023-02-24 DIAGNOSIS — R279 Unspecified lack of coordination: Secondary | ICD-10-CM | POA: Insufficient documentation

## 2023-02-24 DIAGNOSIS — M62838 Other muscle spasm: Secondary | ICD-10-CM | POA: Insufficient documentation

## 2023-02-24 DIAGNOSIS — M6281 Muscle weakness (generalized): Secondary | ICD-10-CM | POA: Diagnosis not present

## 2023-02-24 DIAGNOSIS — R102 Pelvic and perineal pain: Secondary | ICD-10-CM | POA: Insufficient documentation

## 2023-02-24 NOTE — Therapy (Signed)
OUTPATIENT PHYSICAL THERAPY FEMALE PELVIC EVALUATION   Patient Name: Michelle Gray MRN: [id number] DOB:Feb 15, 1955, 68 y.o., female Today's Date: 02/24/2023  END OF SESSION:  PT End of Session - 02/24/23 1536     Visit Number 19    Date for PT Re-Evaluation 04/21/23    Authorization Type [employer] - Medicare    PT Start Time 1530    PT Stop Time 1610    PT Time Calculation (min) 40 min    Activity Tolerance Patient tolerated treatment well    Behavior During Therapy [facility] for tasks assessed/performed              Past Medical History:  Diagnosis Date   Arthritis    Asthma    Cancer (HCC) 1990   right breast ca-mast with reconstr   Cancer [facility] [address]) 12/2020   left breast DCIS   Eczema    dx by derm per patient   Fibromyalgia    Hyperlipidemia    Hypothyroidism    Neuromuscular disorder (HCC)    Osteoporosis    PONV (postoperative nausea and vomiting)    PVC (premature ventricular contraction)    Thyroid disease    Past Surgical History:  Procedure Laterality Date   APPENDECTOMY     ARTHRODESIS METATARSALPHALANGEAL JOINT (MTPJ) Left 10/15/2022   Procedure: ARTHRODESIS METATARSALPHALANGEAL JOINT (MTPJ);  Surgeon: Toni Arthurs, MD;  Location: [facility];  Service: Orthopedics;  Laterality: Left;   BREAST IMPLANT REMOVAL Right 02/11/2021   Procedure: REMOVAL RIGHT BREAST IMPLANT;  Surgeon: Glenna Fellows, MD;  Location: [facility];  Service: Plastics;  Laterality: Right;   BREAST SURGERY Right 1990   Mastectomy   CAPSULECTOMY Right 02/11/2021   Procedure: CAPSULECTOMY;  Surgeon: Glenna Fellows, MD;  Location: [facility];  Service: Plastics;  Laterality: Right;   FOOT SURGERY     LAPROSCOPIC     TOTAL MASTECTOMY Left 02/11/2021   Procedure: LEFT TOTAL MASTECTOMY;  Surgeon: Emelia Loron, MD;  Location: [facility];  Service: General;  Laterality: Left;   WISDOM TEETH REMOVAL     Patient Active Problem  List   Diagnosis Date Noted   Breast cancer, left breast (HCC) 02/11/2021   Cough variant asthma with ? component upper airway cough syndrome 12/23/2018   Primary osteoarthritis of both hands 02/21/2016   Fibromyalgia 02/21/2016   History of hypothyroidism 02/21/2016   Dyslipidemia 02/21/2016   History of breast cancer 02/21/2016   Age-related osteoporosis without current pathological fracture 02/21/2016   Hypothyroidism 08/06/2013   Other and unspecified hyperlipidemia 08/06/2013   Insomnia 08/06/2013   Intrinsic asthma 08/06/2013   Hip pain, left 01/12/2011   Plantar fasciitis 01/12/2011    PCP: Gweneth Dimitri, MD  REFERRING PROVIDER: Shea Evans, MD  REFERRING DIAG: R29.898 (ICD-10-CM) - Other symptoms and signs involving the musculoskeletal system  THERAPY DIAG:  Pain in left hip  Unspecified lack of coordination  Other muscle spasm  Pelvic pain  Other low back pain  Muscle weakness (generalized)  Rationale for Evaluation and Treatment: Rehabilitation  ONSET DATE: 6 months   SUBJECTIVE:  SUBJECTIVE STATEMENT: Pt states that she has been having a little more pain. She has not been able to perform as many exercises as normal. Wife came to learn manual techniques to help with pain relief.  PAIN:  Are you having pain? Yes NPRS scale: 5/10 Pain location:  pelvic pain, Lt side, wraps around into groin and Lt side of sacrum   Pain type: aching and burning Pain description: intermittent   Aggravating factors: sitting, later in the day Relieving factors: stretches (pelvic tilts, hamstring stretches), ice  PRECAUTIONS: None  RED FLAGS: None   WEIGHT BEARING RESTRICTIONS: No  FALLS:  Has patient fallen in last 6 months? No  LIVING ENVIRONMENT: Lives with: lives with their  family Lives in: House/apartment   OCCUPATION: retired  PLOF: Independent  PATIENT GOALS: decrease Lt hip/pelvic pain  PERTINENT HISTORY:  Appendectomy, double mastectomy/breast cancer  BOWEL MOVEMENT: Pain with bowel movement: No Type of bowel movement:Frequency 1x/day and Strain No Fully empty rectum: Yes: - Leakage: No Pads: No Fiber supplement: No  URINATION: Pain with urination: No Fully empty bladder: Yes: - Stream: Strong Urgency: Yes: occasional Frequency: unsure daily, 1-2x/night Leakage:  none  - she is now having some stress incontinence since foot surgery.  Pads: No  INTERCOURSE: Pain with intercourse:  No pain Ability to have vaginal penetration:  Yes: - Climax: WNL Marinoff Scale: 0/3  PREGNANCY: None  PROLAPSE: none   OBJECTIVE:  02/24/23 Significant restriction in Lt obturator internus with tenderness Adductor restriction and fascial restriction over inside of ischial tuberosity and obturator foramen  Trigger points throughout Lt glutes and tender sacral attachments.   12/22/22                             Internal Pelvic Floor burning around superficial pelvic floor layers; pain/tightness in Lt levator ani  Patient confirms identification and approves PT to assess internal pelvic floor and treatment Yes  PELVIC MMT:   MMT eval  Vaginal 4/5, 8 second endurance, 6 repeat contractions with decreasing coordination  (Blank rows = not tested)        TONE: Increased tone in Lt deep pelvic floor  PROLAPSE: WNL  09/28/22: COGNITION: Overall cognitive status: Within functional limits for tasks assessed     SENSATION: Light touch: Appears intact Proprioception: Appears intact  FUNCTIONAL TESTS:  Squat:Rt valgus knee collapse, anterior weight shift Single leg stance: Lt, pelvic collapse on Rt, 5 seconds; Rt stable pelvis >10 seconds  GAIT: Comments: WNL  POSTURE: rounded shoulders, forward head, decreased lumbar lordosis, decreased  thoracic kyphosis, anterior pelvic tilt, and Lt lower thoracolumbar scoliosis  LUMBARAROM/PROM:  A/PROM A/PROM  Eval (% available)  Flexion 75  Extension 100  Right lateral flexion 50  Left lateral flexion 50  Right rotation 50  Left rotation 50   (Blank rows = not tested)  PALPATION:   General  significant Lt chest wall scar tissue that is limiting rib cage mobility; decreased bil lateral rib cage excursion, generalized abdominal tightness likely due to bracing form low back pain and fear of movement                External Perineal Exam WNL                             Internal Pelvic Floor some burning at posterior fourchette; some mild tenderness and increased tension in Lt levator ani;  mild atrophy in Rt levator ani  Patient confirms identification and approves PT to assess internal pelvic floor and treatment Yes  PELVIC MMT:   MMT eval  Vaginal 3/5, 2 second endurance, 6 repeat contractions with decreasing coordination  Internal Anal Sphincter   External Anal Sphincter   Puborectalis   Diastasis Recti WNL  (Blank rows = not tested)        TONE: Slight increase in Rt levator ani  PROLAPSE: WNL  TODAY'S TREATMENT:                                                                                                                              DATE:  02/24/23 Manual: External manual release to Lt obturator internus in Lt side lying Soft tissue mobilization and trigger point release to Lt glutes in Rt side lying Sacral border release on Lt in Rt side lying Therapeutic activities: Partner education on manual techniques to use in pain relief   02/02/23 Manual: Trigger Point Dry-Needling  Treatment instructions: Expect mild to moderate muscle soreness. S/S of pneumothorax if dry needled over a lung field, and to seek immediate medical attention should they occur. Patient verbalized understanding of these instructions and education.  Patient Consent Given: Yes Education  handout provided: Previously provided Muscles treated: Lt glutes and piriformis  Electrical stimulation performed: No Parameters: N/A Treatment response/outcome: twitch response and release Soft tissue mobilization to Lt glutes External Lt obturator trigger point release in Rt side lying  Soft tissue mobilization to Lt adductors in Rt side lying Exercises: Side lying hip abduction 2 x 10 bil Side lying hip adduction 2 x 10 bil Seated hip adduction 2 x 10 ball squeeze Seated hip abduction red band with transversus abdominus and pelvic floor muscle 2 x 10 Seated hip internal rotation with red band 2 x 10  01/19/23 Manual: Lt foot soft tissue mobilization and scar tissue mobilization Soft tissue mobilization to Lt adductors in Lt side lying Lt obturator internus soft tissue mobilization in Lt side lying with myofascial release surrounding Lt ischial tuberosity Bil lateral sacral release and soft tissue mobilization to glutes   PATIENT EDUCATION:  Education details: See above Person educated: Patient Education method: Programmer, multimedia, Facilities manager, Actor cues, Verbal cues, and Handouts Education comprehension: verbalized understanding  HOME EXERCISE PROGRAM: Written handout  ASSESSMENT:  CLINICAL IMPRESSION: Pt having moe difficult week with pain again. She feels like she has not been able to work on exercises as much which may be aggravating her symptoms. Palpation of Lt hip muscles and obturator internus continue to demonstrate restriction and trigger points that reproduce the pain that patient is having. Good tolerance to all manual techniques today; her wife attended session and education performed on manual techniques best for muscle release and pain management. They were able to take pictures to help increase confidence that they would perform these techniques appropriately at home. Pt reported good pain relief at end of session. We also discussed  role of stress in increased  tension. In general, believe that she is making progress towards goals and we are identifying factors that are contributing to pain, such as Lt food pain/surgery/recovery, core weakness, and decreased ability to do exercises recently. She will continue to benefit from skilled PT intervention in order to improve pain, decrease abdominal and chest scar tissue restriction, and begin/progress functional strengthening program.   OBJECTIVE IMPAIRMENTS: decreased activity tolerance, decreased coordination, decreased endurance, decreased strength, increased fascial restrictions, increased muscle spasms, impaired tone, postural dysfunction, and pain.   ACTIVITY LIMITATIONS: bending, sitting, squatting, and locomotion level  PARTICIPATION LIMITATIONS: community activity  PERSONAL FACTORS: 1 comorbidity: medical history   are also affecting patient's functional outcome.   REHAB POTENTIAL: Good  CLINICAL DECISION MAKING: Stable/uncomplicated  EVALUATION COMPLEXITY: Low   GOALS: Goals reviewed with patient? Yes  SHORT TERM GOALS: Target date: 10/26/22 - updated 12/22/22   Pt will be independent with HEP.   Baseline: Goal status: MET 02/02/23  2.  Pt will be independent with diaphragmatic breathing and down training activities in order to improve pelvic floor/abdominal relaxation.  Baseline:  Goal status: MET 02/02/23  3.  Pt will be independent with chest wall scar tissue mobilization techniques and perform at least 2x/week.  Baseline:  Goal status: IN PROGRESS 02/02/23   LONG TERM GOALS: Target date: 11/23/22 - updated 12/22/22 - updated 02/02/23 - updated 02/24/23  Pt will be independent with advanced HEP.   Baseline:  Goal status: IN PROGRESS 02/02/23  2.  Pt will demonstrate normal pelvic floor muscle tone and A/ROM, able to achieve 4/5 strength with contractions and 10 sec endurance, in order to provide appropriate lumbopelvic support in functional activities.   Baseline: 4/5  strength, still working on endurance Goal status: IN PROGRESS 02/24/23  3.  Pt will report no Lt pelvic pain higher than 3/10.  Baseline: patient is having higher pain levels over the last several weeks exceeding 3/10 Goal status: IN PROGRESS 02/24/23  4.  Pt will be able to sit for longer than 60 minutes without increase in pelvic pain. Baseline: driving long distances still aggravating and any prolonged sitting Goal status: IN PROGRESS 02/24/23  5.  Pt will be able to perform single leg stance >20 seconds bil without pelvic drop.  Baseline:  Goal status: IN PROGRESS 02/24/23  6.  Pt will increase all impaired lumbar A/ROM by 25% without pain.  Baseline:  Goal status: IN PROGRESS 02/24/23  PLAN:  PT FREQUENCY: 1-2x/week  PT DURATION: 8 weeks  PLANNED INTERVENTIONS: Therapeutic exercises, Therapeutic activity, Neuromuscular re-education, Balance training, Gait training, Patient/Family education, Self Care, Joint mobilization, Dry Needling, Biofeedback, and Manual therapy  PLAN FOR NEXT SESSION: Continue core training; mobility exercises for pelvic floor; possible dry needling/myofascial release to Lt pelvic girdle    Julio Alm, PT, DPT01/22/253:36 PM

## 2023-03-08 DIAGNOSIS — M79672 Pain in left foot: Secondary | ICD-10-CM | POA: Diagnosis not present

## 2023-03-21 ENCOUNTER — Other Ambulatory Visit: Payer: Self-pay | Admitting: Physician Assistant

## 2023-03-22 NOTE — Telephone Encounter (Signed)
 Last Fill: 02/23/2023   Next Visit: 07/15/2023   Last Visit: 01/14/2023   Dx: Primary insomnia    Current Dose per office note on 01/14/2023: Ambien 10 mg p.o. nightly    Okay to refill Ambien?

## 2023-03-24 NOTE — Telephone Encounter (Signed)
 Ok to increase diltiazem to 180 mg daily. Continue to monitor BP/HR. Thank you -EM

## 2023-03-29 ENCOUNTER — Ambulatory Visit: Payer: Medicare Other | Attending: Obstetrics & Gynecology

## 2023-03-29 DIAGNOSIS — M62838 Other muscle spasm: Secondary | ICD-10-CM | POA: Insufficient documentation

## 2023-03-29 DIAGNOSIS — M6281 Muscle weakness (generalized): Secondary | ICD-10-CM | POA: Insufficient documentation

## 2023-03-29 DIAGNOSIS — M5459 Other low back pain: Secondary | ICD-10-CM | POA: Diagnosis not present

## 2023-03-29 DIAGNOSIS — R279 Unspecified lack of coordination: Secondary | ICD-10-CM | POA: Diagnosis not present

## 2023-03-29 DIAGNOSIS — M25552 Pain in left hip: Secondary | ICD-10-CM | POA: Diagnosis not present

## 2023-03-29 DIAGNOSIS — R102 Pelvic and perineal pain: Secondary | ICD-10-CM | POA: Diagnosis not present

## 2023-03-29 NOTE — Therapy (Signed)
 OUTPATIENT PHYSICAL THERAPY FEMALE PELVIC EVALUATION Progress Note Reporting Period 11/12/22 to 03/29/23  See note below for Objective Data and Assessment of Progress/Goals.      Patient Name: Michelle Gray MRN: [id number] DOB:December 26, 1955, 68 y.o., female Today's Date: 03/29/2023  END OF SESSION:  PT End of Session - 03/29/23 0804     Visit Number 20    Date for PT Re-Evaluation 04/21/23    Authorization Type [employer] - Medicare    Progress Note Due on Visit 30    PT Start Time 0800    PT Stop Time 0840    PT Time Calculation (min) 40 min    Activity Tolerance Patient tolerated treatment well    Behavior During Therapy [facility] for tasks assessed/performed              Past Medical History:  Diagnosis Date   Arthritis    Asthma    Cancer (HCC) 1990   right breast ca-mast with reconstr   Cancer [facility]) 12/2020   left breast DCIS   Eczema    dx by derm per patient   Fibromyalgia    Hyperlipidemia    Hypothyroidism    Neuromuscular disorder (HCC)    Osteoporosis    PONV (postoperative nausea and vomiting)    PVC (premature ventricular contraction)    Thyroid disease    Past Surgical History:  Procedure Laterality Date   APPENDECTOMY     ARTHRODESIS METATARSALPHALANGEAL JOINT (MTPJ) Left 10/15/2022   Procedure: ARTHRODESIS METATARSALPHALANGEAL JOINT (MTPJ);  Surgeon: Toni Arthurs, MD;  Location: [facility];  Service: Orthopedics;  Laterality: Left;   BREAST IMPLANT REMOVAL Right 02/11/2021   Procedure: REMOVAL RIGHT BREAST IMPLANT;  Surgeon: Glenna Fellows, MD;  Location: [facility];  Service: Plastics;  Laterality: Right;   BREAST SURGERY Right 1990   Mastectomy   CAPSULECTOMY Right 02/11/2021   Procedure: CAPSULECTOMY;  Surgeon: Glenna Fellows, MD;  Location: [facility];  Service: Plastics;  Laterality: Right;   FOOT SURGERY     LAPROSCOPIC     TOTAL MASTECTOMY Left 02/11/2021   Procedure: LEFT TOTAL MASTECTOMY;   Surgeon: Emelia Loron, MD;  Location: [facility];  Service: General;  Laterality: Left;   WISDOM TEETH REMOVAL     Patient Active Problem List   Diagnosis Date Noted   Breast cancer, left breast (HCC) 02/11/2021   Cough variant asthma with ? component upper airway cough syndrome 12/23/2018   Primary osteoarthritis of both hands 02/21/2016   Fibromyalgia 02/21/2016   History of hypothyroidism 02/21/2016   Dyslipidemia 02/21/2016   History of breast cancer 02/21/2016   Age-related osteoporosis without current pathological fracture 02/21/2016   Hypothyroidism 08/06/2013   Other and unspecified hyperlipidemia 08/06/2013   Insomnia 08/06/2013   Intrinsic asthma 08/06/2013   Hip pain, left 01/12/2011   Plantar fasciitis 01/12/2011    PCP: Gweneth Dimitri, MD  REFERRING PROVIDER: Shea Evans, MD  REFERRING DIAG: R29.898 (ICD-10-CM) - Other symptoms and signs involving the musculoskeletal system  THERAPY DIAG:  Pain in left hip  Unspecified lack of coordination  Other muscle spasm  Pelvic pain  Other low back pain  Muscle weakness (generalized)  Rationale for Evaluation and Treatment: Rehabilitation  ONSET DATE: 6 months   SUBJECTIVE:  SUBJECTIVE STATEMENT: Pt states that she has been having as much pain as she has since last summer due to sleeping on a hard mattress and more sitting. She feels like stress is making her pain worse as well.   PAIN:  Are you having pain? Yes NPRS scale: 2/10 Pain location:  pelvic pain, Lt side, wraps around into groin and Lt side of sacrum   Pain type: aching and burning Pain description: intermittent   Aggravating factors: sitting, later in the day Relieving factors: stretches (pelvic tilts, hamstring stretches),  ice  PRECAUTIONS: None  RED FLAGS: None   WEIGHT BEARING RESTRICTIONS: No  FALLS:  Has patient fallen in last 6 months? No  LIVING ENVIRONMENT: Lives with: lives with their family Lives in: House/apartment   OCCUPATION: retired  PLOF: Independent  PATIENT GOALS: decrease Lt hip/pelvic pain  PERTINENT HISTORY:  Appendectomy, double mastectomy/breast cancer  BOWEL MOVEMENT: Pain with bowel movement: No Type of bowel movement:Frequency 1x/day and Strain No Fully empty rectum: Yes: - Leakage: No Pads: No Fiber supplement: No  URINATION: Pain with urination: No Fully empty bladder: Yes: - Stream: Strong Urgency: Yes: occasional Frequency: unsure daily, 1-2x/night Leakage:  none  - she is now having some stress incontinence since foot surgery.  Pads: No  INTERCOURSE: Pain with intercourse:  No pain Ability to have vaginal penetration:  Yes: - Climax: WNL Marinoff Scale: 0/3  PREGNANCY: None  PROLAPSE: none   OBJECTIVE:  02/24/23 Significant restriction in Lt obturator internus with tenderness Adductor restriction and fascial restriction over inside of ischial tuberosity and obturator foramen  Trigger points throughout Lt glutes and tender sacral attachments.   12/22/22                             Internal Pelvic Floor burning around superficial pelvic floor layers; pain/tightness in Lt levator ani  Patient confirms identification and approves PT to assess internal pelvic floor and treatment Yes  PELVIC MMT:   MMT eval  Vaginal 4/5, 8 second endurance, 6 repeat contractions with decreasing coordination  (Blank rows = not tested)        TONE: Increased tone in Lt deep pelvic floor  PROLAPSE: WNL  09/28/22: COGNITION: Overall cognitive status: Within functional limits for tasks assessed     SENSATION: Light touch: Appears intact Proprioception: Appears intact  FUNCTIONAL TESTS:  Squat:Rt valgus knee collapse, anterior weight shift Single  leg stance: Lt, pelvic collapse on Rt, 5 seconds; Rt stable pelvis >10 seconds  GAIT: Comments: WNL  POSTURE: rounded shoulders, forward head, decreased lumbar lordosis, decreased thoracic kyphosis, anterior pelvic tilt, and Lt lower thoracolumbar scoliosis  LUMBARAROM/PROM:  A/PROM A/PROM  Eval (% available)  Flexion 75  Extension 100  Right lateral flexion 50  Left lateral flexion 50  Right rotation 50  Left rotation 50   (Blank rows = not tested)  PALPATION:   General  significant Lt chest wall scar tissue that is limiting rib cage mobility; decreased bil lateral rib cage excursion, generalized abdominal tightness likely due to bracing form low back pain and fear of movement                External Perineal Exam WNL                             Internal Pelvic Floor some burning at posterior fourchette; some mild tenderness and increased tension  in Lt levator ani; mild atrophy in Rt levator ani  Patient confirms identification and approves PT to assess internal pelvic floor and treatment Yes  PELVIC MMT:   MMT eval  Vaginal 3/5, 2 second endurance, 6 repeat contractions with decreasing coordination  Internal Anal Sphincter   External Anal Sphincter   Puborectalis   Diastasis Recti WNL  (Blank rows = not tested)        TONE: Slight increase in Rt levator ani  PROLAPSE: WNL  TODAY'S TREATMENT:                                                                                                                              DATE:  03/29/23 Manual: Lt side lying obturator internus trigger point release; myofascial release surrounding Lt ischial tuberosity Soft tissue mobilization to Lt glutes/piriformis Instrument assisted soft tissue mobilization to Lt greater trochanter and lateral thigh Lt sacral border release  02/24/23 Manual: External manual release to Lt obturator internus in Lt side lying Soft tissue mobilization and trigger point release to Lt glutes in Rt side  lying Sacral border release on Lt in Rt side lying Therapeutic activities: Partner education on manual techniques to use in pain relief   02/02/23 Manual: Trigger Point Dry-Needling  Treatment instructions: Expect mild to moderate muscle soreness. S/S of pneumothorax if dry needled over a lung field, and to seek immediate medical attention should they occur. Patient verbalized understanding of these instructions and education.  Patient Consent Given: Yes Education handout provided: Previously provided Muscles treated: Lt glutes and piriformis  Electrical stimulation performed: No Parameters: N/A Treatment response/outcome: twitch response and release Soft tissue mobilization to Lt glutes External Lt obturator trigger point release in Rt side lying  Soft tissue mobilization to Lt adductors in Rt side lying Exercises: Side lying hip abduction 2 x 10 bil Side lying hip adduction 2 x 10 bil Seated hip adduction 2 x 10 ball squeeze Seated hip abduction red band with transversus abdominus and pelvic floor muscle 2 x 10 Seated hip internal rotation with red band 2 x 10    PATIENT EDUCATION:  Education details: See above Person educated: Patient Education method: Programmer, multimedia, Demonstration, Tactile cues, Verbal cues, and Handouts Education comprehension: verbalized understanding  HOME EXERCISE PROGRAM: Written handout  ASSESSMENT:  CLINICAL IMPRESSION: Pt having a set back with increased levels of pain. Believe exacerbation has occurred due to stress, travel, and continuing to deal with foot pain and altered gait pattern. We focused on manual techniques to release significant restriction and trigger points surrounding Lt side of pelvis today with good tolerance. Since she is also having a lot of pain over Lt lateral thigh, instrument assisted soft tissue mobilizatoin performed with tenderness, but overall good tolerance. She will continue to benefit from skilled PT intervention in  order to improve pain, decrease abdominal and chest scar tissue restriction, and begin/progress functional strengthening program.   OBJECTIVE IMPAIRMENTS: decreased activity tolerance, decreased coordination,  decreased endurance, decreased strength, increased fascial restrictions, increased muscle spasms, impaired tone, postural dysfunction, and pain.   ACTIVITY LIMITATIONS: bending, sitting, squatting, and locomotion level  PARTICIPATION LIMITATIONS: community activity  PERSONAL FACTORS: 1 comorbidity: medical history   are also affecting patient's functional outcome.   REHAB POTENTIAL: Good  CLINICAL DECISION MAKING: Stable/uncomplicated  EVALUATION COMPLEXITY: Low   GOALS: Goals reviewed with patient? Yes  SHORT TERM GOALS: Target date: 10/26/22 - updated 12/22/22  - updated 03/29/23  Pt will be independent with HEP.   Baseline: Goal status: MET 02/02/23  2.  Pt will be independent with diaphragmatic breathing and down training activities in order to improve pelvic floor/abdominal relaxation.  Baseline:  Goal status: MET 02/02/23  3.  Pt will be independent with chest wall scar tissue mobilization techniques and perform at least 2x/week.  Baseline:  Goal status: IN PROGRESS 03/29/23   LONG TERM GOALS: Target date: 11/23/22 - updated 12/22/22 - updated 02/02/23 - updated 02/24/23 - updated 03/29/23  Pt will be independent with advanced HEP.   Baseline:  Goal status: IN PROGRESS 03/29/23  2.  Pt will demonstrate normal pelvic floor muscle tone and A/ROM, able to achieve 4/5 strength with contractions and 10 sec endurance, in order to provide appropriate lumbopelvic support in functional activities.   Baseline: 4/5 strength, still working on endurance Goal status: IN PROGRESS 03/29/23  3.  Pt will report no Lt pelvic pain higher than 3/10.  Baseline: patient is having higher pain levels over the last several weeks exceeding 3/10 Goal status: IN PROGRESS 03/29/23  4.  Pt  will be able to sit for longer than 60 minutes without increase in pelvic pain. Baseline: driving long distances still aggravating and any prolonged sitting Goal status: IN PROGRESS 03/29/23  5.  Pt will be able to perform single leg stance >20 seconds bil without pelvic drop.  Baseline:  Goal status: IN PROGRESS 03/29/23  6.  Pt will increase all impaired lumbar A/ROM by 25% without pain.  Baseline:  Goal status: IN PROGRESS 03/29/23  PLAN:  PT FREQUENCY: 1-2x/week  PT DURATION: 8 weeks  PLANNED INTERVENTIONS: Therapeutic exercises, Therapeutic activity, Neuromuscular re-education, Balance training, Gait training, Patient/Family education, Self Care, Joint mobilization, Dry Needling, Biofeedback, and Manual therapy  PLAN FOR NEXT SESSION: Continue core training; mobility exercises for pelvic floor; possible dry needling/myofascial release to Lt pelvic girdle    Julio Alm, PT, DPT02/24/258:40 AM

## 2023-03-31 DIAGNOSIS — M2022 Hallux rigidus, left foot: Secondary | ICD-10-CM | POA: Diagnosis not present

## 2023-03-31 DIAGNOSIS — M2042 Other hammer toe(s) (acquired), left foot: Secondary | ICD-10-CM | POA: Diagnosis not present

## 2023-04-05 ENCOUNTER — Ambulatory Visit: Payer: Medicare Other | Attending: Obstetrics & Gynecology

## 2023-04-05 DIAGNOSIS — R279 Unspecified lack of coordination: Secondary | ICD-10-CM | POA: Insufficient documentation

## 2023-04-05 DIAGNOSIS — R102 Pelvic and perineal pain: Secondary | ICD-10-CM | POA: Insufficient documentation

## 2023-04-05 DIAGNOSIS — M6281 Muscle weakness (generalized): Secondary | ICD-10-CM | POA: Diagnosis not present

## 2023-04-05 DIAGNOSIS — M62838 Other muscle spasm: Secondary | ICD-10-CM | POA: Diagnosis not present

## 2023-04-05 DIAGNOSIS — M5459 Other low back pain: Secondary | ICD-10-CM | POA: Diagnosis not present

## 2023-04-05 DIAGNOSIS — M25552 Pain in left hip: Secondary | ICD-10-CM | POA: Insufficient documentation

## 2023-04-05 NOTE — Therapy (Signed)
 OUTPATIENT PHYSICAL THERAPY FEMALE PELVIC TREATMENT Progress Note Reporting Period 11/12/22 to 03/29/23  See note below for Objective Data and Assessment of Progress/Goals.      Patient Name: Michelle Gray MRN: [id number] DOB:Apr 28, 1955, 68 y.o., female Today's Date: 04/05/2023  END OF SESSION:  PT End of Session - 04/05/23 1232     Visit Number 21    Date for PT Re-Evaluation 04/21/23    Authorization Type [employer] - Medicare    Progress Note Due on Visit 30    PT Start Time 1230    PT Stop Time 1310    PT Time Calculation (min) 40 min    Activity Tolerance Patient tolerated treatment well    Behavior During Therapy [facility] for tasks assessed/performed              Past Medical History:  Diagnosis Date   Arthritis    Asthma    Cancer (HCC) 1990   right breast ca-mast with reconstr   Cancer [facility]) 12/2020   left breast DCIS   Eczema    dx by derm per patient   Fibromyalgia    Hyperlipidemia    Hypothyroidism    Neuromuscular disorder (HCC)    Osteoporosis    PONV (postoperative nausea and vomiting)    PVC (premature ventricular contraction)    Thyroid disease    Past Surgical History:  Procedure Laterality Date   APPENDECTOMY     ARTHRODESIS METATARSALPHALANGEAL JOINT (MTPJ) Left 10/15/2022   Procedure: ARTHRODESIS METATARSALPHALANGEAL JOINT (MTPJ);  Surgeon: Toni Arthurs, MD;  Location: [facility];  Service: Orthopedics;  Laterality: Left;   BREAST IMPLANT REMOVAL Right 02/11/2021   Procedure: REMOVAL RIGHT BREAST IMPLANT;  Surgeon: Glenna Fellows, MD;  Location: [facility];  Service: Plastics;  Laterality: Right;   BREAST SURGERY Right 1990   Mastectomy   CAPSULECTOMY Right 02/11/2021   Procedure: CAPSULECTOMY;  Surgeon: Glenna Fellows, MD;  Location: [facility];  Service: Plastics;  Laterality: Right;   FOOT SURGERY     LAPROSCOPIC     TOTAL MASTECTOMY Left 02/11/2021   Procedure: LEFT TOTAL MASTECTOMY;   Surgeon: Emelia Loron, MD;  Location: [facility];  Service: General;  Laterality: Left;   WISDOM TEETH REMOVAL     Patient Active Problem List   Diagnosis Date Noted   Breast cancer, left breast (HCC) 02/11/2021   Cough variant asthma with ? component upper airway cough syndrome 12/23/2018   Primary osteoarthritis of both hands 02/21/2016   Fibromyalgia 02/21/2016   History of hypothyroidism 02/21/2016   Dyslipidemia 02/21/2016   History of breast cancer 02/21/2016   Age-related osteoporosis without current pathological fracture 02/21/2016   Hypothyroidism 08/06/2013   Other and unspecified hyperlipidemia 08/06/2013   Insomnia 08/06/2013   Intrinsic asthma 08/06/2013   Hip pain, left 01/12/2011   Plantar fasciitis 01/12/2011    PCP: Gweneth Dimitri, MD  REFERRING PROVIDER: Shea Evans, MD  REFERRING DIAG: R29.898 (ICD-10-CM) - Other symptoms and signs involving the musculoskeletal system  THERAPY DIAG:  Pain in left hip  Unspecified lack of coordination  Other muscle spasm  Pelvic pain  Other low back pain  Muscle weakness (generalized)  Rationale for Evaluation and Treatment: Rehabilitation  ONSET DATE: 6 months   SUBJECTIVE:  SUBJECTIVE STATEMENT: Pt states that she feels like instrument assisted soft tissue mobilization was helpful, but did leave her very bruised. She states that she started getting tight again Friday and Saturday. She reports morning are usually better and she gets more sore throughout the day.   PAIN:  Are you having pain? Yes NPRS scale: 1/10 Pain location:  pelvic pain, Lt side, wraps around into groin and Lt side of sacrum   Pain type: aching and burning Pain description: intermittent   Aggravating factors: sitting, later in the  day Relieving factors: stretches (pelvic tilts, hamstring stretches), ice  PRECAUTIONS: None  RED FLAGS: None   WEIGHT BEARING RESTRICTIONS: No  FALLS:  Has patient fallen in last 6 months? No  LIVING ENVIRONMENT: Lives with: lives with their family Lives in: House/apartment   OCCUPATION: retired  PLOF: Independent  PATIENT GOALS: decrease Lt hip/pelvic pain  PERTINENT HISTORY:  Appendectomy, double mastectomy/breast cancer  BOWEL MOVEMENT: Pain with bowel movement: No Type of bowel movement:Frequency 1x/day and Strain No Fully empty rectum: Yes: - Leakage: No Pads: No Fiber supplement: No  URINATION: Pain with urination: No Fully empty bladder: Yes: - Stream: Strong Urgency: Yes: occasional Frequency: unsure daily, 1-2x/night Leakage:  none  - she is now having some stress incontinence since foot surgery.  Pads: No  INTERCOURSE: Pain with intercourse:  No pain Ability to have vaginal penetration:  Yes: - Climax: WNL Marinoff Scale: 0/3  PREGNANCY: None  PROLAPSE: none   OBJECTIVE:  02/24/23 Significant restriction in Lt obturator internus with tenderness Adductor restriction and fascial restriction over inside of ischial tuberosity and obturator foramen  Trigger points throughout Lt glutes and tender sacral attachments.   12/22/22                             Internal Pelvic Floor burning around superficial pelvic floor layers; pain/tightness in Lt levator ani  Patient confirms identification and approves PT to assess internal pelvic floor and treatment Yes  PELVIC MMT:   MMT eval  Vaginal 4/5, 8 second endurance, 6 repeat contractions with decreasing coordination  (Blank rows = not tested)        TONE: Increased tone in Lt deep pelvic floor  PROLAPSE: WNL  09/28/22: COGNITION: Overall cognitive status: Within functional limits for tasks assessed     SENSATION: Light touch: Appears intact Proprioception: Appears  intact  FUNCTIONAL TESTS:  Squat:Rt valgus knee collapse, anterior weight shift Single leg stance: Lt, pelvic collapse on Rt, 5 seconds; Rt stable pelvis >10 seconds  GAIT: Comments: WNL  POSTURE: rounded shoulders, forward head, decreased lumbar lordosis, decreased thoracic kyphosis, anterior pelvic tilt, and Lt lower thoracolumbar scoliosis  LUMBARAROM/PROM:  A/PROM A/PROM  Eval (% available)  Flexion 75  Extension 100  Right lateral flexion 50  Left lateral flexion 50  Right rotation 50  Left rotation 50   (Blank rows = not tested)  PALPATION:   General  significant Lt chest wall scar tissue that is limiting rib cage mobility; decreased bil lateral rib cage excursion, generalized abdominal tightness likely due to bracing form low back pain and fear of movement                External Perineal Exam WNL                             Internal Pelvic Floor some burning at posterior  fourchette; some mild tenderness and increased tension in Lt levator ani; mild atrophy in Rt levator ani  Patient confirms identification and approves PT to assess internal pelvic floor and treatment Yes  PELVIC MMT:   MMT eval  Vaginal 3/5, 2 second endurance, 6 repeat contractions with decreasing coordination  Internal Anal Sphincter   External Anal Sphincter   Puborectalis   Diastasis Recti WNL  (Blank rows = not tested)        TONE: Slight increase in Rt levator ani  PROLAPSE: WNL  TODAY'S TREATMENT:                                                                                                                              DATE:  04/05/23 Manual: Lt side lying obturator internus trigger point release; myofascial release surrounding Lt ischial tuberosity Soft tissue mobilization to Lt glutes/piriformis Lt sacral border release Neuromuscular re-education: Seated pelvic tilts 2 x 10 Seated forward fold 2 x 10 breaths  Seated hip adduction ball press with transversus abdominus and  pelvic floor muscle 2 x 10 Seated hip adduction with LE push/pull 2 x 10 Seated lateral body mobility stretch 2 x 10 breaths bil   03/29/23 Manual: Lt side lying obturator internus trigger point release; myofascial release surrounding Lt ischial tuberosity Soft tissue mobilization to Lt glutes/piriformis Instrument assisted soft tissue mobilization to Lt greater trochanter and lateral thigh Lt sacral border release  02/24/23 Manual: External manual release to Lt obturator internus in Lt side lying Soft tissue mobilization and trigger point release to Lt glutes in Rt side lying Sacral border release on Lt in Rt side lying Therapeutic activities: Partner education on manual techniques to use in pain relief   PATIENT EDUCATION:  Education details: See above Person educated: Patient Education method: Programmer, multimedia, Facilities manager, Actor cues, Verbal cues, and Handouts Education comprehension: verbalized understanding  HOME EXERCISE PROGRAM: Written handout  ASSESSMENT:  CLINICAL IMPRESSION: Pt did very well after last visit and had a good week, but feels like she is starting to get tight again. She does have notable bruising still present from instrument assisted soft tissue mobilizatoin performed last session, so we did not perform again; we did do some soft tissue mobilization to the area with good tolerance and no abnormal soreness from bruising. She still has trigger points in Lt obturator internus, but good tolerance to release techniques to the area. She did very well with all stretches and mobility exercises. We discussed focusing strengthening earlier in the day and do stretches later in the day. She reported good improvement in tightness at end of session. She will continue to benefit from skilled PT intervention in order to improve pain, decrease abdominal and chest scar tissue restriction, and begin/progress functional strengthening program.   OBJECTIVE IMPAIRMENTS: decreased  activity tolerance, decreased coordination, decreased endurance, decreased strength, increased fascial restrictions, increased muscle spasms, impaired tone, postural dysfunction, and pain.   ACTIVITY LIMITATIONS: bending, sitting, squatting, and  locomotion level  PARTICIPATION LIMITATIONS: community activity  PERSONAL FACTORS: 1 comorbidity: medical history   are also affecting patient's functional outcome.   REHAB POTENTIAL: Good  CLINICAL DECISION MAKING: Stable/uncomplicated  EVALUATION COMPLEXITY: Low   GOALS: Goals reviewed with patient? Yes  SHORT TERM GOALS: Target date: 10/26/22 - updated 12/22/22  - updated 03/29/23  Pt will be independent with HEP.   Baseline: Goal status: MET 02/02/23  2.  Pt will be independent with diaphragmatic breathing and down training activities in order to improve pelvic floor/abdominal relaxation.  Baseline:  Goal status: MET 02/02/23  3.  Pt will be independent with chest wall scar tissue mobilization techniques and perform at least 2x/week.  Baseline:  Goal status: IN PROGRESS 03/29/23   LONG TERM GOALS: Target date: 11/23/22 - updated 12/22/22 - updated 02/02/23 - updated 02/24/23 - updated 03/29/23  Pt will be independent with advanced HEP.   Baseline:  Goal status: IN PROGRESS 03/29/23  2.  Pt will demonstrate normal pelvic floor muscle tone and A/ROM, able to achieve 4/5 strength with contractions and 10 sec endurance, in order to provide appropriate lumbopelvic support in functional activities.   Baseline: 4/5 strength, still working on endurance Goal status: IN PROGRESS 03/29/23  3.  Pt will report no Lt pelvic pain higher than 3/10.  Baseline: patient is having higher pain levels over the last several weeks exceeding 3/10 Goal status: IN PROGRESS 03/29/23  4.  Pt will be able to sit for longer than 60 minutes without increase in pelvic pain. Baseline: driving long distances still aggravating and any prolonged sitting Goal  status: IN PROGRESS 03/29/23  5.  Pt will be able to perform single leg stance >20 seconds bil without pelvic drop.  Baseline:  Goal status: IN PROGRESS 03/29/23  6.  Pt will increase all impaired lumbar A/ROM by 25% without pain.  Baseline:  Goal status: IN PROGRESS 03/29/23  PLAN:  PT FREQUENCY: 1-2x/week  PT DURATION: 8 weeks  PLANNED INTERVENTIONS: Therapeutic exercises, Therapeutic activity, Neuromuscular re-education, Balance training, Gait training, Patient/Family education, Self Care, Joint mobilization, Dry Needling, Biofeedback, and Manual therapy  PLAN FOR NEXT SESSION: Continue core training; mobility exercises for pelvic floor; possible dry needling/myofascial release to Lt pelvic girdle    Julio Alm, PT, DPT03/03/251:13 PM

## 2023-04-12 ENCOUNTER — Other Ambulatory Visit: Payer: Self-pay | Admitting: Rheumatology

## 2023-04-12 ENCOUNTER — Ambulatory Visit: Payer: Medicare Other

## 2023-04-12 DIAGNOSIS — M62838 Other muscle spasm: Secondary | ICD-10-CM | POA: Diagnosis not present

## 2023-04-12 DIAGNOSIS — M25552 Pain in left hip: Secondary | ICD-10-CM | POA: Diagnosis not present

## 2023-04-12 DIAGNOSIS — M5459 Other low back pain: Secondary | ICD-10-CM

## 2023-04-12 DIAGNOSIS — R102 Pelvic and perineal pain: Secondary | ICD-10-CM

## 2023-04-12 DIAGNOSIS — M6281 Muscle weakness (generalized): Secondary | ICD-10-CM | POA: Diagnosis not present

## 2023-04-12 DIAGNOSIS — R279 Unspecified lack of coordination: Secondary | ICD-10-CM

## 2023-04-12 NOTE — Telephone Encounter (Signed)
 Advised patient that she is due for labs in 1 month. Patient verbalized understanding and is scheduled to have the labs (CBC and CMP) drawn with her PCP.

## 2023-04-12 NOTE — Telephone Encounter (Signed)
 Due for lab work in 1 month

## 2023-04-12 NOTE — Therapy (Signed)
 OUTPATIENT PHYSICAL THERAPY FEMALE PELVIC TREATMENT   Patient Name: Michelle Gray MRN: [id number] DOB:[birth year], 68 y.o., female Today's Date: 04/12/2023  END OF SESSION:  PT End of Session - 04/12/23 0847     Visit Number 22    Date for PT Re-Evaluation 07/05/23    Authorization Type [employer] - Medicare    Progress Note Due on Visit 30    PT Start Time 0845    PT Stop Time 0925    PT Time Calculation (min) 40 min    Activity Tolerance Patient tolerated treatment well    Behavior During Therapy [facility] for tasks assessed/performed              Past Medical History:  Diagnosis Date   Arthritis    Asthma    Cancer (HCC) 1990   right breast ca-mast with reconstr   Cancer [facility]) 12/2020   left breast DCIS   Eczema    dx by derm per patient   Fibromyalgia    Hyperlipidemia    Hypothyroidism    Neuromuscular disorder (HCC)    Osteoporosis    PONV (postoperative nausea and vomiting)    PVC (premature ventricular contraction)    Thyroid disease    Past Surgical History:  Procedure Laterality Date   APPENDECTOMY     ARTHRODESIS METATARSALPHALANGEAL JOINT (MTPJ) Left 10/15/2022   Procedure: ARTHRODESIS METATARSALPHALANGEAL JOINT (MTPJ);  Surgeon: Toni Arthurs, MD;  Location: [facility];  Service: Orthopedics;  Laterality: Left;   BREAST IMPLANT REMOVAL Right 02/11/2021   Procedure: REMOVAL RIGHT BREAST IMPLANT;  Surgeon: Glenna Fellows, MD;  Location: [facility];  Service: Plastics;  Laterality: Right;   BREAST SURGERY Right 1990   Mastectomy   CAPSULECTOMY Right 02/11/2021   Procedure: CAPSULECTOMY;  Surgeon: Glenna Fellows, MD;  Location: [facility];  Service: Plastics;  Laterality: Right;   FOOT SURGERY     LAPROSCOPIC     TOTAL MASTECTOMY Left 02/11/2021   Procedure: LEFT TOTAL MASTECTOMY;  Surgeon: Emelia Loron, MD;  Location: [facility];  Service: General;  Laterality: Left;   WISDOM TEETH REMOVAL      Patient Active Problem List   Diagnosis Date Noted   Breast cancer, left breast (HCC) 02/11/2021   Cough variant asthma with ? component upper airway cough syndrome 12/23/2018   Primary osteoarthritis of both hands 02/21/2016   Fibromyalgia 02/21/2016   History of hypothyroidism 02/21/2016   Dyslipidemia 02/21/2016   History of breast cancer 02/21/2016   Age-related osteoporosis without current pathological fracture 02/21/2016   Hypothyroidism 08/06/2013   Other and unspecified hyperlipidemia 08/06/2013   Insomnia 08/06/2013   Intrinsic asthma 08/06/2013   Hip pain, left 01/12/2011   Plantar fasciitis 01/12/2011    PCP: Gweneth Dimitri, MD  REFERRING PROVIDER: Shea Evans, MD  REFERRING DIAG: R29.898 (ICD-10-CM) - Other symptoms and signs involving the musculoskeletal system  THERAPY DIAG:  Pain in left hip  Unspecified lack of coordination  Other muscle spasm  Pelvic pain  Other low back pain  Muscle weakness (generalized)  Rationale for Evaluation and Treatment: Rehabilitation  ONSET DATE: 6 months   SUBJECTIVE:  SUBJECTIVE STATEMENT: Pt states that she is having a better week this week. She is not working on exercises quite as frequently. The pain she has had has been more in low back vs Lt hip and perineal pain.   PAIN:  Are you having pain? Yes NPRS scale: 0/10 Pain location:  pelvic pain, Lt side, wraps around into groin and Lt side of sacrum   Pain type: aching and burning Pain description: intermittent   Aggravating factors: sitting, later in the day Relieving factors: stretches (pelvic tilts, hamstring stretches), ice  PRECAUTIONS: None  RED FLAGS: None   WEIGHT BEARING RESTRICTIONS: No  FALLS:  Has patient fallen in last 6 months? No  LIVING  ENVIRONMENT: Lives with: lives with their family Lives in: House/apartment   OCCUPATION: retired  PLOF: Independent  PATIENT GOALS: decrease Lt hip/pelvic pain  PERTINENT HISTORY:  Appendectomy, double mastectomy/breast cancer  BOWEL MOVEMENT: Pain with bowel movement: No Type of bowel movement:Frequency 1x/day and Strain No Fully empty rectum: Yes: - Leakage: No Pads: No Fiber supplement: No  URINATION: Pain with urination: No Fully empty bladder: Yes: - Stream: Strong Urgency: Yes: occasional Frequency: unsure daily, 1-2x/night Leakage:  none  - she is now having some stress incontinence since foot surgery.  Pads: No  INTERCOURSE: Pain with intercourse:  No pain Ability to have vaginal penetration:  Yes: - Climax: WNL Marinoff Scale: 0/3  PREGNANCY: None  PROLAPSE: none   OBJECTIVE:  04/12/23 Improving Lt adductor restriction, obturator restriction Still tender at Lt ischial tuberosity and inferior pubic ramus  Notable restriction in Rt obliques/lats/lumbar paraspinals  02/24/23 Significant restriction in Lt obturator internus with tenderness Adductor restriction and fascial restriction over inside of ischial tuberosity and obturator foramen  Trigger points throughout Lt glutes and tender sacral attachments.   12/22/22                             Internal Pelvic Floor burning around superficial pelvic floor layers; pain/tightness in Lt levator ani  Patient confirms identification and approves PT to assess internal pelvic floor and treatment Yes  PELVIC MMT:   MMT eval  Vaginal 4/5, 8 second endurance, 6 repeat contractions with decreasing coordination  (Blank rows = not tested)        TONE: Increased tone in Lt deep pelvic floor  PROLAPSE: WNL  09/28/22: COGNITION: Overall cognitive status: Within functional limits for tasks assessed     SENSATION: Light touch: Appears intact Proprioception: Appears intact  FUNCTIONAL TESTS:  Squat:Rt  valgus knee collapse, anterior weight shift Single leg stance: Lt, pelvic collapse on Rt, 5 seconds; Rt stable pelvis >10 seconds  GAIT: Comments: WNL  POSTURE: rounded shoulders, forward head, decreased lumbar lordosis, decreased thoracic kyphosis, anterior pelvic tilt, and Lt lower thoracolumbar scoliosis  LUMBARAROM/PROM:  A/PROM A/PROM  Eval (% available)  Flexion 75  Extension 100  Right lateral flexion 50  Left lateral flexion 50  Right rotation 50  Left rotation 50   (Blank rows = not tested)  PALPATION:   General  significant Lt chest wall scar tissue that is limiting rib cage mobility; decreased bil lateral rib cage excursion, generalized abdominal tightness likely due to bracing form low back pain and fear of movement                External Perineal Exam WNL  Internal Pelvic Floor some burning at posterior fourchette; some mild tenderness and increased tension in Lt levator ani; mild atrophy in Rt levator ani  Patient confirms identification and approves PT to assess internal pelvic floor and treatment Yes  PELVIC MMT:   MMT eval  Vaginal 3/5, 2 second endurance, 6 repeat contractions with decreasing coordination  Internal Anal Sphincter   External Anal Sphincter   Puborectalis   Diastasis Recti WNL  (Blank rows = not tested)        TONE: Slight increase in Rt levator ani  PROLAPSE: WNL  TODAY'S TREATMENT:                                                                                                                              DATE:  04/12/23 RE-EVAL Manual: Soft tissue mobilization to bil lumbar paraspinals, Rt obliques/lats Lt side lying obturator internus trigger point release; myofascial release surrounding Lt ischial tuberosity Lt sacral border release Exercises: Single knee to chest 10 sec hold, 5x bil Wide foot lower trunk rotation 10x bil, 10 sec holds Seated forward fold 10 breaths Seated thoracic openers 5x  bil Seated lateral flexion 10 breaths bil  04/05/23 Manual: Lt side lying obturator internus trigger point release; myofascial release surrounding Lt ischial tuberosity Soft tissue mobilization to Lt glutes/piriformis Lt sacral border release Neuromuscular re-education: Seated pelvic tilts 2 x 10 Seated forward fold 2 x 10 breaths  Seated hip adduction ball press with transversus abdominus and pelvic floor muscle 2 x 10 Seated hip adduction with LE push/pull 2 x 10 Seated lateral body mobility stretch 2 x 10 breaths bil   03/29/23 Manual: Lt side lying obturator internus trigger point release; myofascial release surrounding Lt ischial tuberosity Soft tissue mobilization to Lt glutes/piriformis Instrument assisted soft tissue mobilization to Lt greater trochanter and lateral thigh Lt sacral border release    PATIENT EDUCATION:  Education details: See above Person educated: Patient Education method: Programmer, multimedia, Demonstration, Actor cues, Verbal cues, and Handouts Education comprehension: verbalized understanding  HOME EXERCISE PROGRAM: Written handout  ASSESSMENT:  CLINICAL IMPRESSION: Pt overall doing very well. She had several weeks of exacerbation, but believes this was due to travel and sleeping in a bed she was not used to in addition to not performing exercises on a regular basis. She was able to get pain down drastically after last weeks session and maintain some of these improvements. She is demonstrating improvements in trigger points/restriction surrounding Lt pelvis and adductors today. She did have more tightness in low back today, focused in Rt obliques/lats/paraspinals, which she does report more tightness. She will continue to benefit from skilled PT intervention in order to improve pain, decrease abdominal and chest scar tissue restriction, and begin/progress functional strengthening program.   OBJECTIVE IMPAIRMENTS: decreased activity tolerance, decreased  coordination, decreased endurance, decreased strength, increased fascial restrictions, increased muscle spasms, impaired tone, postural dysfunction, and pain.   ACTIVITY LIMITATIONS: bending, sitting, squatting, and locomotion level  PARTICIPATION LIMITATIONS: community activity  PERSONAL FACTORS: 1 comorbidity: medical history   are also affecting patient's functional outcome.   REHAB POTENTIAL: Good  CLINICAL DECISION MAKING: Stable/uncomplicated  EVALUATION COMPLEXITY: Low   GOALS: Goals reviewed with patient? Yes  SHORT TERM GOALS: Target date: 10/26/22 - updated 12/22/22  - updated 03/29/23 - updated 04/12/23  Pt will be independent with HEP.   Baseline: Goal status: MET 02/02/23  2.  Pt will be independent with diaphragmatic breathing and down training activities in order to improve pelvic floor/abdominal relaxation.  Baseline:  Goal status: MET 02/02/23  3.  Pt will be independent with chest wall scar tissue mobilization techniques and perform at least 2x/week.  Baseline:  Goal status: IN PROGRESS 04/12/23   LONG TERM GOALS: Target date: 11/23/22 - updated 12/22/22 - updated 02/02/23 - updated 02/24/23 - updated 03/29/23 - updated 04/12/23  Pt will be independent with advanced HEP.   Baseline:  Goal status: IN PROGRESS 04/12/23  2.  Pt will demonstrate normal pelvic floor muscle tone and A/ROM, able to achieve 4/5 strength with contractions and 10 sec endurance, in order to provide appropriate lumbopelvic support in functional activities.   Baseline: 4/5 strength, still working on endurance Goal status: IN PROGRESS 04/12/23  3.  Pt will report no Lt pelvic pain higher than 3/10.  Baseline: patient is having higher pain levels over the last several weeks exceeding 3/10 Goal status: MET 04/12/23  4.  Pt will be able to sit for longer than 60 minutes without increase in pelvic pain. Baseline: driving long distances still aggravating and any prolonged sitting Goal  status: IN PROGRESS 04/12/23  5.  Pt will be able to perform single leg stance >20 seconds bil without pelvic drop.  Baseline:  Goal status: IN PROGRESS 04/12/23  6.  Pt will increase all impaired lumbar A/ROM by 25% without pain.  Baseline:  Goal status: IN PROGRESS 04/12/23  PLAN:  PT FREQUENCY: 1-2x/week  PT DURATION: 12 weeks  PLANNED INTERVENTIONS: Therapeutic exercises, Therapeutic activity, Neuromuscular re-education, Balance training, Gait training, Patient/Family education, Self Care, Joint mobilization, Dry Needling, Biofeedback, and Manual therapy  PLAN FOR NEXT SESSION: Continue core training; mobility exercises for pelvic floor; possible dry needling/myofascial release to Lt pelvic girdle    Julio Alm, PT, DPT03/10/259:23 AM

## 2023-04-12 NOTE — Telephone Encounter (Signed)
 Last Fill: 01/14/2023  Labs: 11/16/2022  RBC 3.98 MCV 100.6 MCH 33.8  Next Visit: 07/15/2023  Last Visit: 01/14/2023  DX: Fibromyalgia   Current Dose per office note on 01/14/2023: meloxicam 15 mg p.o. daily.   Okay to refill Meloxicam?

## 2023-04-26 ENCOUNTER — Other Ambulatory Visit: Payer: Self-pay | Admitting: *Deleted

## 2023-04-26 MED ORDER — ZOLPIDEM TARTRATE 10 MG PO TABS: 10.0000 mg | ORAL_TABLET | Freq: Every day | ORAL | 0 refills | Status: DC

## 2023-04-26 NOTE — Telephone Encounter (Signed)
 Refill request received via fax from [facility]  for Ambien   Last Fill: 03/22/2023   Next Visit: 07/15/2023   Last Visit: 01/14/2023   Dx: Primary insomnia    Current Dose per office note on 01/14/2023: Ambien 10 mg p.o. nightly    Okay to refill Ambien?

## 2023-05-03 ENCOUNTER — Ambulatory Visit: Payer: Self-pay

## 2023-05-03 DIAGNOSIS — M62838 Other muscle spasm: Secondary | ICD-10-CM

## 2023-05-03 DIAGNOSIS — M25552 Pain in left hip: Secondary | ICD-10-CM | POA: Diagnosis not present

## 2023-05-03 DIAGNOSIS — M6281 Muscle weakness (generalized): Secondary | ICD-10-CM

## 2023-05-03 DIAGNOSIS — M5459 Other low back pain: Secondary | ICD-10-CM | POA: Diagnosis not present

## 2023-05-03 DIAGNOSIS — R279 Unspecified lack of coordination: Secondary | ICD-10-CM

## 2023-05-03 DIAGNOSIS — R102 Pelvic and perineal pain: Secondary | ICD-10-CM

## 2023-05-03 NOTE — Therapy (Signed)
 OUTPATIENT PHYSICAL THERAPY FEMALE PELVIC TREATMENT   Patient Name: Michelle Gray MRN: [id number] DOB:[birth year], 68 y.o., female Today's Date: 05/03/2023  END OF SESSION:  PT End of Session - 05/03/23 1354     Visit Number 23    Date for PT Re-Evaluation 07/05/23    Authorization Type Aetna - Medicare    Progress Note Due on Visit 30    PT Start Time 1400    PT Stop Time 1440    PT Time Calculation (min) 40 min    Activity Tolerance Patient tolerated treatment well    Behavior During Therapy [facility] for tasks assessed/performed              Past Medical History:  Diagnosis Date   Arthritis    Asthma    Cancer (HCC) 1990   right breast ca-mast with reconstr   Cancer [facility]) 12/2020   left breast DCIS   Eczema    dx by derm per patient   Fibromyalgia    Hyperlipidemia    Hypothyroidism    Neuromuscular disorder (HCC)    Osteoporosis    PONV (postoperative nausea and vomiting)    PVC (premature ventricular contraction)    Thyroid disease    Past Surgical History:  Procedure Laterality Date   APPENDECTOMY     ARTHRODESIS METATARSALPHALANGEAL JOINT (MTPJ) Left 10/15/2022   Procedure: ARTHRODESIS METATARSALPHALANGEAL JOINT (MTPJ);  Surgeon: Toni Arthurs, MD;  Location: [facility];  Service: Orthopedics;  Laterality: Left;   BREAST IMPLANT REMOVAL Right 02/11/2021   Procedure: REMOVAL RIGHT BREAST IMPLANT;  Surgeon: Glenna Fellows, MD;  Location: [facility];  Service: Plastics;  Laterality: Right;   BREAST SURGERY Right 1990   Mastectomy   CAPSULECTOMY Right 02/11/2021   Procedure: CAPSULECTOMY;  Surgeon: Glenna Fellows, MD;  Location: [facility];  Service: Plastics;  Laterality: Right;   FOOT SURGERY     LAPROSCOPIC     TOTAL MASTECTOMY Left 02/11/2021   Procedure: LEFT TOTAL MASTECTOMY;  Surgeon: Emelia Loron, MD;  Location: [facility];  Service: General;  Laterality: Left;   WISDOM TEETH REMOVAL      Patient Active Problem List   Diagnosis Date Noted   Breast cancer, left breast (HCC) 02/11/2021   Cough variant asthma with ? component upper airway cough syndrome 12/23/2018   Primary osteoarthritis of both hands 02/21/2016   Fibromyalgia 02/21/2016   History of hypothyroidism 02/21/2016   Dyslipidemia 02/21/2016   History of breast cancer 02/21/2016   Age-related osteoporosis without current pathological fracture 02/21/2016   Hypothyroidism 08/06/2013   Other and unspecified hyperlipidemia 08/06/2013   Insomnia 08/06/2013   Intrinsic asthma 08/06/2013   Hip pain, left 01/12/2011   Plantar fasciitis 01/12/2011    PCP: Gweneth Dimitri, MD  REFERRING PROVIDER: Shea Evans, MD  REFERRING DIAG: R29.898 (ICD-10-CM) - Other symptoms and signs involving the musculoskeletal system  THERAPY DIAG:  Pain in left hip  Unspecified lack of coordination  Other muscle spasm  Pelvic pain  Other low back pain  Muscle weakness (generalized)  Rationale for Evaluation and Treatment: Rehabilitation  ONSET DATE: 6 months   SUBJECTIVE:  SUBJECTIVE STATEMENT: Pt states that she pulled something around the Rt side of her pelvis shortly after her last visit.   PAIN:  Are you having pain? Yes NPRS scale: 0/10 Pain location:  pelvic pain, Lt side, wraps around into groin and Lt side of sacrum   Pain type: aching and burning Pain description: intermittent   Aggravating factors: sitting, later in the day Relieving factors: stretches (pelvic tilts, hamstring stretches), ice  PRECAUTIONS: None  RED FLAGS: None   WEIGHT BEARING RESTRICTIONS: No  FALLS:  Has patient fallen in last 6 months? No  LIVING ENVIRONMENT: Lives with: lives with their family Lives in: House/apartment   OCCUPATION:  retired  PLOF: Independent  PATIENT GOALS: decrease Lt hip/pelvic pain  PERTINENT HISTORY:  Appendectomy, double mastectomy/breast cancer  BOWEL MOVEMENT: Pain with bowel movement: No Type of bowel movement:Frequency 1x/day and Strain No Fully empty rectum: Yes: - Leakage: No Pads: No Fiber supplement: No  URINATION: Pain with urination: No Fully empty bladder: Yes: - Stream: Strong Urgency: Yes: occasional Frequency: unsure daily, 1-2x/night Leakage:  none  - she is now having some stress incontinence since foot surgery.  Pads: No  INTERCOURSE: Pain with intercourse:  No pain Ability to have vaginal penetration:  Yes: - Climax: WNL Marinoff Scale: 0/3  PREGNANCY: None  PROLAPSE: none   OBJECTIVE:  04/12/23 Improving Lt adductor restriction, obturator restriction Still tender at Lt ischial tuberosity and inferior pubic ramus  Notable restriction in Rt obliques/lats/lumbar paraspinals  02/24/23 Significant restriction in Lt obturator internus with tenderness Adductor restriction and fascial restriction over inside of ischial tuberosity and obturator foramen  Trigger points throughout Lt glutes and tender sacral attachments.   12/22/22                             Internal Pelvic Floor burning around superficial pelvic floor layers; pain/tightness in Lt levator ani  Patient confirms identification and approves PT to assess internal pelvic floor and treatment Yes  PELVIC MMT:   MMT eval  Vaginal 4/5, 8 second endurance, 6 repeat contractions with decreasing coordination  (Blank rows = not tested)        TONE: Increased tone in Lt deep pelvic floor  PROLAPSE: WNL  09/28/22: COGNITION: Overall cognitive status: Within functional limits for tasks assessed     SENSATION: Light touch: Appears intact Proprioception: Appears intact  FUNCTIONAL TESTS:  Squat:Rt valgus knee collapse, anterior weight shift Single leg stance: Lt, pelvic collapse on Rt, 5  seconds; Rt stable pelvis >10 seconds  GAIT: Comments: WNL  POSTURE: rounded shoulders, forward head, decreased lumbar lordosis, decreased thoracic kyphosis, anterior pelvic tilt, and Lt lower thoracolumbar scoliosis  LUMBARAROM/PROM:  A/PROM A/PROM  Eval (% available)  Flexion 75  Extension 100  Right lateral flexion 50  Left lateral flexion 50  Right rotation 50  Left rotation 50   (Blank rows = not tested)  PALPATION:   General  significant Lt chest wall scar tissue that is limiting rib cage mobility; decreased bil lateral rib cage excursion, generalized abdominal tightness likely due to bracing form low back pain and fear of movement                External Perineal Exam WNL                             Internal Pelvic Floor some burning at posterior fourchette;  some mild tenderness and increased tension in Lt levator ani; mild atrophy in Rt levator ani  Patient confirms identification and approves PT to assess internal pelvic floor and treatment Yes  PELVIC MMT:   MMT eval  Vaginal 3/5, 2 second endurance, 6 repeat contractions with decreasing coordination  Internal Anal Sphincter   External Anal Sphincter   Puborectalis   Diastasis Recti WNL  (Blank rows = not tested)        TONE: Slight increase in Rt levator ani  PROLAPSE: WNL  TODAY'S TREATMENT:                                                                                                                              DATE:  05/03/23 Neuromuscular re-education: Supine dead bugs with form correction 2 x 10 Supine heel taps 2 x 10  Exercises: Lower trunk rotation 10x Seated forward fold 10 breaths  Therapeutic activities: Standing shoulder extensions green band 3 x 10 Standing pallof press red band 2 x 10 bil  Standing hip abduction with shoulder elevation to 90 degrees 2 lbs 2 x 10 bil Standing march with half lunge 2lbs 2 x 10 bil   04/12/23 RE-EVAL Manual: Soft tissue mobilization to bil lumbar  paraspinals, Rt obliques/lats Lt side lying obturator internus trigger point release; myofascial release surrounding Lt ischial tuberosity Lt sacral border release Exercises: Single knee to chest 10 sec hold, 5x bil Wide foot lower trunk rotation 10x bil, 10 sec holds Seated forward fold 10 breaths Seated thoracic openers 5x bil Seated lateral flexion 10 breaths bil  04/05/23 Manual: Lt side lying obturator internus trigger point release; myofascial release surrounding Lt ischial tuberosity Soft tissue mobilization to Lt glutes/piriformis Lt sacral border release Neuromuscular re-education: Seated pelvic tilts 2 x 10 Seated forward fold 2 x 10 breaths  Seated hip adduction ball press with transversus abdominus and pelvic floor muscle 2 x 10 Seated hip adduction with LE push/pull 2 x 10 Seated lateral body mobility stretch 2 x 10 breaths bil    PATIENT EDUCATION:  Education details: See above Person educated: Patient Education method: Programmer, multimedia, Demonstration, Tactile cues, Verbal cues, and Handouts Education comprehension: verbalized understanding  HOME EXERCISE PROGRAM: Written handout  ASSESSMENT:  CLINICAL IMPRESSION: Pt continues to do well other than having pulled something in Rt groin/pelvis. She believes that increased activity overall has been very helpful at improving her comfort level. We reviewed supine core activities and we made some corrections to slow pace down and focus on lumbopelvic stability. She reported feeling very good at end of session without manual techniques today. She will continue to benefit from skilled PT intervention in order to improve pain, decrease abdominal and chest scar tissue restriction, and begin/progress functional strengthening program.   OBJECTIVE IMPAIRMENTS: decreased activity tolerance, decreased coordination, decreased endurance, decreased strength, increased fascial restrictions, increased muscle spasms, impaired tone, postural  dysfunction, and pain.   ACTIVITY LIMITATIONS: bending, sitting, squatting, and locomotion  level  PARTICIPATION LIMITATIONS: community activity  PERSONAL FACTORS: 1 comorbidity: medical history   are also affecting patient's functional outcome.   REHAB POTENTIAL: Good  CLINICAL DECISION MAKING: Stable/uncomplicated  EVALUATION COMPLEXITY: Low   GOALS: Goals reviewed with patient? Yes  SHORT TERM GOALS: Target date: 10/26/22 - updated 12/22/22  - updated 03/29/23 - updated 04/12/23  Pt will be independent with HEP.   Baseline: Goal status: MET 02/02/23  2.  Pt will be independent with diaphragmatic breathing and down training activities in order to improve pelvic floor/abdominal relaxation.  Baseline:  Goal status: MET 02/02/23  3.  Pt will be independent with chest wall scar tissue mobilization techniques and perform at least 2x/week.  Baseline:  Goal status: IN PROGRESS 04/12/23   LONG TERM GOALS: Target date: 11/23/22 - updated 12/22/22 - updated 02/02/23 - updated 02/24/23 - updated 03/29/23 - updated 04/12/23  Pt will be independent with advanced HEP.   Baseline:  Goal status: IN PROGRESS 04/12/23  2.  Pt will demonstrate normal pelvic floor muscle tone and A/ROM, able to achieve 4/5 strength with contractions and 10 sec endurance, in order to provide appropriate lumbopelvic support in functional activities.   Baseline: 4/5 strength, still working on endurance Goal status: IN PROGRESS 04/12/23  3.  Pt will report no Lt pelvic pain higher than 3/10.  Baseline: patient is having higher pain levels over the last several weeks exceeding 3/10 Goal status: MET 04/12/23  4.  Pt will be able to sit for longer than 60 minutes without increase in pelvic pain. Baseline: driving long distances still aggravating and any prolonged sitting Goal status: IN PROGRESS 04/12/23  5.  Pt will be able to perform single leg stance >20 seconds bil without pelvic drop.  Baseline:  Goal  status: IN PROGRESS 04/12/23  6.  Pt will increase all impaired lumbar A/ROM by 25% without pain.  Baseline:  Goal status: IN PROGRESS 04/12/23  PLAN:  PT FREQUENCY: 1-2x/week  PT DURATION: 12 weeks  PLANNED INTERVENTIONS: Therapeutic exercises, Therapeutic activity, Neuromuscular re-education, Balance training, Gait training, Patient/Family education, Self Care, Joint mobilization, Dry Needling, Biofeedback, and Manual therapy  PLAN FOR NEXT SESSION: Continue core training; mobility exercises for pelvic floor; possible dry needling/myofascial release to Lt pelvic girdle    Julio Alm, PT, DPT03/31/253:00 PM

## 2023-05-18 ENCOUNTER — Ambulatory Visit: Payer: Medicare Other | Attending: Obstetrics & Gynecology

## 2023-05-18 DIAGNOSIS — R279 Unspecified lack of coordination: Secondary | ICD-10-CM | POA: Insufficient documentation

## 2023-05-18 DIAGNOSIS — M6281 Muscle weakness (generalized): Secondary | ICD-10-CM | POA: Diagnosis not present

## 2023-05-18 DIAGNOSIS — R102 Pelvic and perineal pain: Secondary | ICD-10-CM | POA: Insufficient documentation

## 2023-05-18 DIAGNOSIS — M5459 Other low back pain: Secondary | ICD-10-CM | POA: Diagnosis not present

## 2023-05-18 DIAGNOSIS — M62838 Other muscle spasm: Secondary | ICD-10-CM | POA: Insufficient documentation

## 2023-05-18 DIAGNOSIS — M25552 Pain in left hip: Secondary | ICD-10-CM | POA: Insufficient documentation

## 2023-05-18 NOTE — Therapy (Signed)
 OUTPATIENT PHYSICAL THERAPY FEMALE PELVIC TREATMENT   Patient Name: Michelle Gray MRN: [id number] DOB:1955/11/06, 68 y.o., female Today's Date: 05/18/2023  END OF SESSION:  PT End of Session - 05/18/23 0757     Visit Number 24    Date for PT Re-Evaluation 07/05/23    Authorization Type Aetna - Medicare    Progress Note Due on Visit 30    PT Start Time 0800    PT Stop Time 0840    PT Time Calculation (min) 40 min    Activity Tolerance Patient tolerated treatment well    Behavior During Therapy [facility] for tasks assessed/performed              Past Medical History:  Diagnosis Date   Arthritis    Asthma    Cancer (HCC) 1990   right breast ca-mast with reconstr   Cancer [facility]) 12/2020   left breast DCIS   Eczema    dx by derm per patient   Fibromyalgia    Hyperlipidemia    Hypothyroidism    Neuromuscular disorder (HCC)    Osteoporosis    PONV (postoperative nausea and vomiting)    PVC (premature ventricular contraction)    Thyroid disease    Past Surgical History:  Procedure Laterality Date   APPENDECTOMY     ARTHRODESIS METATARSALPHALANGEAL JOINT (MTPJ) Left 10/15/2022   Procedure: ARTHRODESIS METATARSALPHALANGEAL JOINT (MTPJ);  Surgeon: Amada Backer, MD;  Location: [facility];  Service: Orthopedics;  Laterality: Left;   BREAST IMPLANT REMOVAL Right 02/11/2021   Procedure: REMOVAL RIGHT BREAST IMPLANT;  Surgeon: Alger Infield, MD;  Location: [facility];  Service: Plastics;  Laterality: Right;   BREAST SURGERY Right 1990   Mastectomy   CAPSULECTOMY Right 02/11/2021   Procedure: CAPSULECTOMY;  Surgeon: Alger Infield, MD;  Location: [facility];  Service: Plastics;  Laterality: Right;   FOOT SURGERY     LAPROSCOPIC     TOTAL MASTECTOMY Left 02/11/2021   Procedure: LEFT TOTAL MASTECTOMY;  Surgeon: Enid Harry, MD;  Location: [facility];  Service: General;  Laterality: Left;   WISDOM TEETH REMOVAL      Patient Active Problem List   Diagnosis Date Noted   Breast cancer, left breast (HCC) 02/11/2021   Cough variant asthma with ? component upper airway cough syndrome 12/23/2018   Primary osteoarthritis of both hands 02/21/2016   Fibromyalgia 02/21/2016   History of hypothyroidism 02/21/2016   Dyslipidemia 02/21/2016   History of breast cancer 02/21/2016   Age-related osteoporosis without current pathological fracture 02/21/2016   Hypothyroidism 08/06/2013   Other and unspecified hyperlipidemia 08/06/2013   Insomnia 08/06/2013   Intrinsic asthma 08/06/2013   Hip pain, left 01/12/2011   Plantar fasciitis 01/12/2011    PCP: Helyn Lobstein, MD  REFERRING PROVIDER: Terri Fester, MD  REFERRING DIAG: R29.898 (ICD-10-CM) - Other symptoms and signs involving the musculoskeletal system  THERAPY DIAG:  Pain in left hip  Unspecified lack of coordination  Other muscle spasm  Pelvic pain  Other low back pain  Muscle weakness (generalized)  Rationale for Evaluation and Treatment: Rehabilitation  ONSET DATE: 6 months   SUBJECTIVE:  SUBJECTIVE STATEMENT: Pt states that she did some yard work yesterday and if feeling a little stiff. She notices that when she does a lot of bending she will typically have this soreness in her low back for a day or 2.   PAIN:  Are you having pain? Yes NPRS scale: 0/10 Pain location:  pelvic pain, Lt side, wraps around into groin and Lt side of sacrum   Pain type: aching and burning Pain description: intermittent   Aggravating factors: sitting, later in the day Relieving factors: stretches (pelvic tilts, hamstring stretches), ice  PRECAUTIONS: None  RED FLAGS: None   WEIGHT BEARING RESTRICTIONS: No  FALLS:  Has patient fallen in last 6 months?  No  LIVING ENVIRONMENT: Lives with: lives with their family Lives in: House/apartment   OCCUPATION: retired  PLOF: Independent  PATIENT GOALS: decrease Lt hip/pelvic pain  PERTINENT HISTORY:  Appendectomy, double mastectomy/breast cancer  BOWEL MOVEMENT: Pain with bowel movement: No Type of bowel movement:Frequency 1x/day and Strain No Fully empty rectum: Yes: - Leakage: No Pads: No Fiber supplement: No  URINATION: Pain with urination: No Fully empty bladder: Yes: - Stream: Strong Urgency: Yes: occasional Frequency: unsure daily, 1-2x/night Leakage:  none  - she is now having some stress incontinence since foot surgery.  Pads: No  INTERCOURSE: Pain with intercourse:  No pain Ability to have vaginal penetration:  Yes: - Climax: WNL Marinoff Scale: 0/3  PREGNANCY: None  PROLAPSE: none   OBJECTIVE:  04/12/23 Improving Lt adductor restriction, obturator restriction Still tender at Lt ischial tuberosity and inferior pubic ramus  Notable restriction in Rt obliques/lats/lumbar paraspinals  02/24/23 Significant restriction in Lt obturator internus with tenderness Adductor restriction and fascial restriction over inside of ischial tuberosity and obturator foramen  Trigger points throughout Lt glutes and tender sacral attachments.   12/22/22                             Internal Pelvic Floor burning around superficial pelvic floor layers; pain/tightness in Lt levator ani  Patient confirms identification and approves PT to assess internal pelvic floor and treatment Yes  PELVIC MMT:   MMT eval  Vaginal 4/5, 8 second endurance, 6 repeat contractions with decreasing coordination  (Blank rows = not tested)        TONE: Increased tone in Lt deep pelvic floor  PROLAPSE: WNL  09/28/22: COGNITION: Overall cognitive status: Within functional limits for tasks assessed     SENSATION: Light touch: Appears intact Proprioception: Appears intact  FUNCTIONAL  TESTS:  Squat:Rt valgus knee collapse, anterior weight shift Single leg stance: Lt, pelvic collapse on Rt, 5 seconds; Rt stable pelvis >10 seconds  GAIT: Comments: WNL  POSTURE: rounded shoulders, forward head, decreased lumbar lordosis, decreased thoracic kyphosis, anterior pelvic tilt, and Lt lower thoracolumbar scoliosis  LUMBARAROM/PROM:  A/PROM A/PROM  Eval (% available)  Flexion 75  Extension 100  Right lateral flexion 50  Left lateral flexion 50  Right rotation 50  Left rotation 50   (Blank rows = not tested)  PALPATION:   General  significant Lt chest wall scar tissue that is limiting rib cage mobility; decreased bil lateral rib cage excursion, generalized abdominal tightness likely due to bracing form low back pain and fear of movement                External Perineal Exam WNL  Internal Pelvic Floor some burning at posterior fourchette; some mild tenderness and increased tension in Lt levator ani; mild atrophy in Rt levator ani  Patient confirms identification and approves PT to assess internal pelvic floor and treatment Yes  PELVIC MMT:   MMT eval  Vaginal 3/5, 2 second endurance, 6 repeat contractions with decreasing coordination  Internal Anal Sphincter   External Anal Sphincter   Puborectalis   Diastasis Recti WNL  (Blank rows = not tested)        TONE: Slight increase in Rt levator ani  PROLAPSE: WNL  TODAY'S TREATMENT:                                                                                                                              DATE:  05/18/23 Therapeutic activities: Standing shoulder extensions green band 3 x 10 Standing pallof press red band 2 x 10 bil  Bent unilateral rows 2 x 10 bil 4lbs  Straight leg dead lifts 15 lbs 4 x 10 with significant cues for core activation, thoracic paraspinal activation, and good form Sit to stands   05/03/23 Neuromuscular re-education: Supine dead bugs with form  correction 2 x 10 Supine heel taps 2 x 10  Exercises: Lower trunk rotation 10x Seated forward fold 10 breaths  Therapeutic activities: Standing shoulder extensions green band 3 x 10 Standing pallof press red band 2 x 10 bil  Standing hip abduction with shoulder elevation to 90 degrees 2 lbs 2 x 10 bil Standing march with half lunge 2lbs 2 x 10 bil   04/12/23 RE-EVAL Manual: Soft tissue mobilization to bil lumbar paraspinals, Rt obliques/lats Lt side lying obturator internus trigger point release; myofascial release surrounding Lt ischial tuberosity Lt sacral border release Exercises: Single knee to chest 10 sec hold, 5x bil Wide foot lower trunk rotation 10x bil, 10 sec holds Seated forward fold 10 breaths Seated thoracic openers 5x bil Seated lateral flexion 10 breaths bil   PATIENT EDUCATION:  Education details: See above Person educated: Patient Education method: Programmer, multimedia, Demonstration, Actor cues, Verbal cues, and Handouts Education comprehension: verbalized understanding  HOME EXERCISE PROGRAM: Written handout  ASSESSMENT:  CLINICAL IMPRESSION: Pt is doing well with overall low pelvic pain levels. However, she feels a pull in the area of thoracolumbar fascia/lat attachment in Rt low back/pelvis that feels worse when she does a lot of bending activities like the yard work she did over the weekend. We focused on strengthening core and back/core in bent positions to help improve strength and coordination for gardening activities that are aggravating. She had very difficult time keeping core engaged and also appropriate lower trap/lat activation for good posture and support in these positions. She did make some great improvements in proprioception and understanding when she had good form vs slipping out of good form. She will continue to benefit from skilled PT intervention in order to improve pain, decrease abdominal and chest scar tissue restriction, and begin/progress  functional strengthening program.   OBJECTIVE IMPAIRMENTS: decreased activity tolerance, decreased coordination, decreased endurance, decreased strength, increased fascial restrictions, increased muscle spasms, impaired tone, postural dysfunction, and pain.   ACTIVITY LIMITATIONS: bending, sitting, squatting, and locomotion level  PARTICIPATION LIMITATIONS: community activity  PERSONAL FACTORS: 1 comorbidity: medical history   are also affecting patient's functional outcome.   REHAB POTENTIAL: Good  CLINICAL DECISION MAKING: Stable/uncomplicated  EVALUATION COMPLEXITY: Low   GOALS: Goals reviewed with patient? Yes  SHORT TERM GOALS: Updated 05/18/23  Pt will be independent with HEP.   Baseline: Goal status: MET 02/02/23  2.  Pt will be independent with diaphragmatic breathing and down training activities in order to improve pelvic floor/abdominal relaxation.  Baseline:  Goal status: MET 02/02/23  3.  Pt will be independent with chest wall scar tissue mobilization techniques and perform at least 2x/week.  Baseline:  Goal status: IN PROGRESS 05/18/23   LONG TERM GOALS: updated 05/18/23  Pt will be independent with advanced HEP.   Baseline:  Goal status: IN PROGRESS 05/18/23  2.  Pt will demonstrate normal pelvic floor muscle tone and A/ROM, able to achieve 4/5 strength with contractions and 10 sec endurance, in order to provide appropriate lumbopelvic support in functional activities.   Baseline: 4/5 strength, still working on endurance Goal status: IN PROGRESS 05/18/23  3.  Pt will report no Lt pelvic pain higher than 3/10.  Baseline: patient is having higher pain levels over the last several weeks exceeding 3/10 Goal status: MET 04/12/23  4.  Pt will be able to sit for longer than 60 minutes without increase in pelvic pain. Baseline: driving long distances still aggravating and any prolonged sitting Goal status: IN PROGRESS 05/18/23  5.  Pt will be able to perform  single leg stance >20 seconds bil without pelvic drop.  Baseline:  Goal status: IN PROGRESS 05/18/23  6.  Pt will increase all impaired lumbar A/ROM by 25% without pain.  Baseline:  Goal status: IN PROGRESS 05/18/23  PLAN:  PT FREQUENCY: 1-2x/week  PT DURATION: 12 weeks  PLANNED INTERVENTIONS: Therapeutic exercises, Therapeutic activity, Neuromuscular re-education, Balance training, Gait training, Patient/Family education, Self Care, Joint mobilization, Dry Needling, Biofeedback, and Manual therapy  PLAN FOR NEXT SESSION: Continue core training; mobility exercises for pelvic floor; possible dry needling/myofascial release to Lt pelvic girdle    Verlena Glenn, PT, DPT04/15/258:47 AM

## 2023-05-20 DIAGNOSIS — Z79899 Other long term (current) drug therapy: Secondary | ICD-10-CM | POA: Diagnosis not present

## 2023-05-20 DIAGNOSIS — E039 Hypothyroidism, unspecified: Secondary | ICD-10-CM | POA: Diagnosis not present

## 2023-05-20 DIAGNOSIS — E782 Mixed hyperlipidemia: Secondary | ICD-10-CM | POA: Diagnosis not present

## 2023-05-20 LAB — TSH: TSH: 1.1 (ref 0.41–5.90)

## 2023-05-25 ENCOUNTER — Other Ambulatory Visit: Payer: Self-pay | Admitting: Physician Assistant

## 2023-05-25 ENCOUNTER — Ambulatory Visit: Payer: Medicare Other

## 2023-05-25 DIAGNOSIS — R102 Pelvic and perineal pain: Secondary | ICD-10-CM | POA: Diagnosis not present

## 2023-05-25 DIAGNOSIS — M62838 Other muscle spasm: Secondary | ICD-10-CM | POA: Diagnosis not present

## 2023-05-25 DIAGNOSIS — M5459 Other low back pain: Secondary | ICD-10-CM

## 2023-05-25 DIAGNOSIS — M25552 Pain in left hip: Secondary | ICD-10-CM

## 2023-05-25 DIAGNOSIS — M6281 Muscle weakness (generalized): Secondary | ICD-10-CM

## 2023-05-25 DIAGNOSIS — R279 Unspecified lack of coordination: Secondary | ICD-10-CM | POA: Diagnosis not present

## 2023-05-25 NOTE — Telephone Encounter (Signed)
 Last Fill: 04/26/2023  Next Visit: 07/15/2023  Last Visit: 01/14/2023  Dx: Primary insomnia   Current Dose per office note on 01/14/2023: Ambien  10 mg p.o. nightly.   Okay to refill Ambien ?

## 2023-05-25 NOTE — Therapy (Signed)
 OUTPATIENT PHYSICAL THERAPY FEMALE PELVIC TREATMENT   Patient Name: Michelle Gray MRN: [id number] DOB:04-Oct-1955, 68 y.o., female Today's Date: 05/25/2023  END OF SESSION:  PT End of Session - 05/25/23 0853     Visit Number 25    Date for PT Re-Evaluation 07/05/23    Authorization Type [employer] - Medicare    Progress Note Due on Visit 30    PT Start Time 0845    PT Stop Time 0925    PT Time Calculation (min) 40 min    Activity Tolerance Patient tolerated treatment well    Behavior During Therapy [facility] for tasks assessed/performed              Past Medical History:  Diagnosis Date   Arthritis    Asthma    Cancer (HCC) 1990   right breast ca-mast with reconstr   Cancer [facility]) 12/2020   left breast DCIS   Eczema    dx by derm per patient   Fibromyalgia    Hyperlipidemia    Hypothyroidism    Neuromuscular disorder (HCC)    Osteoporosis    PONV (postoperative nausea and vomiting)    PVC (premature ventricular contraction)    Thyroid  disease    Past Surgical History:  Procedure Laterality Date   APPENDECTOMY     ARTHRODESIS METATARSALPHALANGEAL JOINT (MTPJ) Left 10/15/2022   Procedure: ARTHRODESIS METATARSALPHALANGEAL JOINT (MTPJ);  Surgeon: Amada Backer, MD;  Location: [facility];  Service: Orthopedics;  Laterality: Left;   BREAST IMPLANT REMOVAL Right 02/11/2021   Procedure: REMOVAL RIGHT BREAST IMPLANT;  Surgeon: Alger Infield, MD;  Location: [facility];  Service: Plastics;  Laterality: Right;   BREAST SURGERY Right 1990   Mastectomy   CAPSULECTOMY Right 02/11/2021   Procedure: CAPSULECTOMY;  Surgeon: Alger Infield, MD;  Location: [facility];  Service: Plastics;  Laterality: Right;   FOOT SURGERY     LAPROSCOPIC     TOTAL MASTECTOMY Left 02/11/2021   Procedure: LEFT TOTAL MASTECTOMY;  Surgeon: Enid Harry, MD;  Location:  SURGERY CENTER;  Service: General;  Laterality: Left;   WISDOM TEETH REMOVAL      Patient Active Problem List   Diagnosis Date Noted   Breast cancer, left breast (HCC) 02/11/2021   Cough variant asthma with ? component upper airway cough syndrome 12/23/2018   Primary osteoarthritis of both hands 02/21/2016   Fibromyalgia 02/21/2016   History of hypothyroidism 02/21/2016   Dyslipidemia 02/21/2016   History of breast cancer 02/21/2016   Age-related osteoporosis without current pathological fracture 02/21/2016   Hypothyroidism 08/06/2013   Other and unspecified hyperlipidemia 08/06/2013   Insomnia 08/06/2013   Intrinsic asthma 08/06/2013   Hip pain, left 01/12/2011   Plantar fasciitis 01/12/2011    PCP: Helyn Lobstein, MD  REFERRING PROVIDER: Terri Fester, MD  REFERRING DIAG: R29.898 (ICD-10-CM) - Other symptoms and signs involving the musculoskeletal system  THERAPY DIAG:  Pain in left hip  Unspecified lack of coordination  Other muscle spasm  Pelvic pain  Other low back pain  Muscle weakness (generalized)  Rationale for Evaluation and Treatment: Rehabilitation  ONSET DATE: 6 months   SUBJECTIVE:  SUBJECTIVE STATEMENT: Pt states that she was in a lot of pain yesterday due to driving back from [place] and sleeping on a different bed. She states that she is tired. She is having some soreness in bil hips and low back.   PAIN:  Are you having pain? Yes NPRS scale: 3/10 Pain location:  pelvic pain, Lt side, wraps around into groin and Lt side of sacrum   Pain type: aching and burning Pain description: intermittent   Aggravating factors: sitting, later in the day Relieving factors: stretches (pelvic tilts, hamstring stretches), ice  PRECAUTIONS: None  RED FLAGS: None   WEIGHT BEARING RESTRICTIONS: No  FALLS:  Has patient fallen in last 6 months?  No  LIVING ENVIRONMENT: Lives with: lives with their family Lives in: House/apartment   OCCUPATION: retired  PLOF: Independent  PATIENT GOALS: decrease Lt hip/pelvic pain  PERTINENT HISTORY:  Appendectomy, double mastectomy/breast cancer  BOWEL MOVEMENT: Pain with bowel movement: No Type of bowel movement:Frequency 1x/day and Strain No Fully empty rectum: Yes: - Leakage: No Pads: No Fiber supplement: No  URINATION: Pain with urination: No Fully empty bladder: Yes: - Stream: Strong Urgency: Yes: occasional Frequency: unsure daily, 1-2x/night Leakage:  none  - she is now having some stress incontinence since foot surgery.  Pads: No  INTERCOURSE: Pain with intercourse:  No pain Ability to have vaginal penetration:  Yes: - Climax: WNL Marinoff Scale: 0/3  PREGNANCY: None  PROLAPSE: none   OBJECTIVE:  04/12/23 Improving Lt adductor restriction, obturator restriction Still tender at Lt ischial tuberosity and inferior pubic ramus  Notable restriction in Rt obliques/lats/lumbar paraspinals  02/24/23 Significant restriction in Lt obturator internus with tenderness Adductor restriction and fascial restriction over inside of ischial tuberosity and obturator foramen  Trigger points throughout Lt glutes and tender sacral attachments.   12/22/22                             Internal Pelvic Floor burning around superficial pelvic floor layers; pain/tightness in Lt levator ani  Patient confirms identification and approves PT to assess internal pelvic floor and treatment Yes  PELVIC MMT:   MMT eval  Vaginal 4/5, 8 second endurance, 6 repeat contractions with decreasing coordination  (Blank rows = not tested)        TONE: Increased tone in Lt deep pelvic floor  PROLAPSE: WNL  09/28/22: COGNITION: Overall cognitive status: Within functional limits for tasks assessed     SENSATION: Light touch: Appears intact Proprioception: Appears intact  FUNCTIONAL  TESTS:  Squat:Rt valgus knee collapse, anterior weight shift Single leg stance: Lt, pelvic collapse on Rt, 5 seconds; Rt stable pelvis >10 seconds  GAIT: Comments: WNL  POSTURE: rounded shoulders, forward head, decreased lumbar lordosis, decreased thoracic kyphosis, anterior pelvic tilt, and Lt lower thoracolumbar scoliosis  LUMBARAROM/PROM:  A/PROM A/PROM  Eval (% available)  Flexion 75  Extension 100  Right lateral flexion 50  Left lateral flexion 50  Right rotation 50  Left rotation 50   (Blank rows = not tested)  PALPATION:   General  significant Lt chest wall scar tissue that is limiting rib cage mobility; decreased bil lateral rib cage excursion, generalized abdominal tightness likely due to bracing form low back pain and fear of movement                External Perineal Exam WNL  Internal Pelvic Floor some burning at posterior fourchette; some mild tenderness and increased tension in Lt levator ani; mild atrophy in Rt levator ani  Patient confirms identification and approves PT to assess internal pelvic floor and treatment Yes  PELVIC MMT:   MMT eval  Vaginal 3/5, 2 second endurance, 6 repeat contractions with decreasing coordination  Internal Anal Sphincter   External Anal Sphincter   Puborectalis   Diastasis Recti WNL  (Blank rows = not tested)        TONE: Slight increase in Rt levator ani  PROLAPSE: WNL  TODAY'S TREATMENT:                                                                                                                              DATE:  05/25/23 Manual: Rt side lying soft tissue mobilization to lumbar paraspinals, quadratus lumborum, obliques Trigger Point Dry Needling  Subsequent Treatment: Instructions provided previously at initial dry needling treatment.  Instructions reviewed, if requested by the patient, prior to subsequent dry needling treatment.   Patient Verbal Consent Given: Yes Education Handout  Provided: Previously Provided Muscles Treated: Lt piriformis Electrical Stimulation Performed: No Treatment Response/Outcome: twitch response and release  Manual trigger point release to Lt glutes and piriformis Exercises: Seated wide leg twist 10x Seated lateral side bending 8x bil Standing hip circles 10x bil Therapeutic activities: Squats to table 2 x 10   05/18/23 Therapeutic activities: Standing shoulder extensions green band 3 x 10 Standing pallof press red band 2 x 10 bil  Bent unilateral rows 2 x 10 bil 4lbs  Straight leg dead lifts 15 lbs 4 x 10 with significant cues for core activation, thoracic paraspinal activation, and good form Sit to stands   05/03/23 Neuromuscular re-education: Supine dead bugs with form correction 2 x 10 Supine heel taps 2 x 10  Exercises: Lower trunk rotation 10x Seated forward fold 10 breaths  Therapeutic activities: Standing shoulder extensions green band 3 x 10 Standing pallof press red band 2 x 10 bil  Standing hip abduction with shoulder elevation to 90 degrees 2 lbs 2 x 10 bil Standing march with half lunge 2lbs 2 x 10 bil   PATIENT EDUCATION:  Education details: See above Person educated: Patient Education method: Explanation, Demonstration, Tactile cues, Verbal cues, and Handouts Education comprehension: verbalized understanding  HOME EXERCISE PROGRAM: Written handout  ASSESSMENT:  CLINICAL IMPRESSION: Pt having a little more pain today after traveling over the weekend. Believe that the prolonged sitting is what causes her piriformis pain to flare up. We performed some dry needling and manual techniques to help improve the muscular and trigger point restriction in this area with good tolerance. We followed this up with some stretches to get into side mobility which she tolerated very well. She will continue to benefit from skilled PT intervention in order to improve pain, decrease abdominal and chest scar tissue restriction,  and begin/progress functional strengthening program.   OBJECTIVE IMPAIRMENTS:  decreased activity tolerance, decreased coordination, decreased endurance, decreased strength, increased fascial restrictions, increased muscle spasms, impaired tone, postural dysfunction, and pain.   ACTIVITY LIMITATIONS: bending, sitting, squatting, and locomotion level  PARTICIPATION LIMITATIONS: community activity  PERSONAL FACTORS: 1 comorbidity: medical history   are also affecting patient's functional outcome.   REHAB POTENTIAL: Good  CLINICAL DECISION MAKING: Stable/uncomplicated  EVALUATION COMPLEXITY: Low   GOALS: Goals reviewed with patient? Yes  SHORT TERM GOALS: Updated 05/18/23  Pt will be independent with HEP.   Baseline: Goal status: MET 02/02/23  2.  Pt will be independent with diaphragmatic breathing and down training activities in order to improve pelvic floor/abdominal relaxation.  Baseline:  Goal status: MET 02/02/23  3.  Pt will be independent with chest wall scar tissue mobilization techniques and perform at least 2x/week.  Baseline:  Goal status: IN PROGRESS 05/18/23   LONG TERM GOALS: updated 05/18/23  Pt will be independent with advanced HEP.   Baseline:  Goal status: IN PROGRESS 05/18/23  2.  Pt will demonstrate normal pelvic floor muscle tone and A/ROM, able to achieve 4/5 strength with contractions and 10 sec endurance, in order to provide appropriate lumbopelvic support in functional activities.   Baseline: 4/5 strength, still working on endurance Goal status: IN PROGRESS 05/18/23  3.  Pt will report no Lt pelvic pain higher than 3/10.  Baseline: patient is having higher pain levels over the last several weeks exceeding 3/10 Goal status: MET 04/12/23  4.  Pt will be able to sit for longer than 60 minutes without increase in pelvic pain. Baseline: driving long distances still aggravating and any prolonged sitting Goal status: IN PROGRESS 05/18/23  5.  Pt will be  able to perform single leg stance >20 seconds bil without pelvic drop.  Baseline:  Goal status: IN PROGRESS 05/18/23  6.  Pt will increase all impaired lumbar A/ROM by 25% without pain.  Baseline:  Goal status: IN PROGRESS 05/18/23  PLAN:  PT FREQUENCY: 1-2x/week  PT DURATION: 12 weeks  PLANNED INTERVENTIONS: Therapeutic exercises, Therapeutic activity, Neuromuscular re-education, Balance training, Gait training, Patient/Family education, Self Care, Joint mobilization, Dry Needling, Biofeedback, and Manual therapy  PLAN FOR NEXT SESSION: Continue core training; mobility exercises for pelvic floor; possible dry needling/myofascial release to Lt pelvic girdle    Verlena Glenn, PT, DPT04/22/259:26 AM

## 2023-05-26 DIAGNOSIS — E039 Hypothyroidism, unspecified: Secondary | ICD-10-CM | POA: Diagnosis not present

## 2023-05-26 DIAGNOSIS — Z23 Encounter for immunization: Secondary | ICD-10-CM | POA: Diagnosis not present

## 2023-05-26 DIAGNOSIS — Z6826 Body mass index (BMI) 26.0-26.9, adult: Secondary | ICD-10-CM | POA: Diagnosis not present

## 2023-05-26 DIAGNOSIS — F439 Reaction to severe stress, unspecified: Secondary | ICD-10-CM | POA: Diagnosis not present

## 2023-05-26 DIAGNOSIS — Z87898 Personal history of other specified conditions: Secondary | ICD-10-CM | POA: Diagnosis not present

## 2023-05-26 DIAGNOSIS — E782 Mixed hyperlipidemia: Secondary | ICD-10-CM | POA: Diagnosis not present

## 2023-05-26 DIAGNOSIS — E559 Vitamin D deficiency, unspecified: Secondary | ICD-10-CM | POA: Diagnosis not present

## 2023-05-26 DIAGNOSIS — M797 Fibromyalgia: Secondary | ICD-10-CM | POA: Diagnosis not present

## 2023-05-26 DIAGNOSIS — J45909 Unspecified asthma, uncomplicated: Secondary | ICD-10-CM | POA: Diagnosis not present

## 2023-05-27 ENCOUNTER — Telehealth: Payer: Self-pay | Admitting: *Deleted

## 2023-05-27 NOTE — Telephone Encounter (Signed)
 Labs received from:[facility] on: 05/20/2023  Reviewed by: Dr. Nicholas Bari   Labs drawn: CBC, CMP, TSH, Lipid Panel  Results: RBC 4.12   MCH 33.2   Mono % 15.0   Mono # 0.9   HDLD 96   Chol/HDL Ratio 1.9

## 2023-05-28 ENCOUNTER — Other Ambulatory Visit: Payer: Self-pay | Admitting: *Deleted

## 2023-05-28 MED ORDER — METHOCARBAMOL 750 MG PO TABS: 750.0000 mg | ORAL_TABLET | Freq: Every evening | ORAL | 0 refills | Status: AC | PRN

## 2023-05-28 NOTE — Telephone Encounter (Signed)
 Patient contacted the office requesting a refill on Methocarbamol  750 mg to be sent to the [facility]. Patient states she has been doing PT and has been doing increased activity around the house. Patient states she is feeling increased muscle discomfort.   Last Fill: 03/25/2021  Next Visit: 07/15/2023  Last Visit: 01/14/2023  Dx: Fibromyalgia   Current Dose per office note on 01/14/2023: methocarbamol  750 mg p.o. nightly as needed   Okay to refill Methocarbamol ?

## 2023-05-31 ENCOUNTER — Other Ambulatory Visit: Payer: Self-pay | Admitting: Cardiology

## 2023-06-01 ENCOUNTER — Ambulatory Visit: Payer: Medicare Other

## 2023-06-01 DIAGNOSIS — M62838 Other muscle spasm: Secondary | ICD-10-CM | POA: Diagnosis not present

## 2023-06-01 DIAGNOSIS — M6281 Muscle weakness (generalized): Secondary | ICD-10-CM | POA: Diagnosis not present

## 2023-06-01 DIAGNOSIS — M25552 Pain in left hip: Secondary | ICD-10-CM

## 2023-06-01 DIAGNOSIS — M5459 Other low back pain: Secondary | ICD-10-CM

## 2023-06-01 DIAGNOSIS — R279 Unspecified lack of coordination: Secondary | ICD-10-CM | POA: Diagnosis not present

## 2023-06-01 DIAGNOSIS — R102 Pelvic and perineal pain: Secondary | ICD-10-CM

## 2023-06-01 NOTE — Therapy (Signed)
 OUTPATIENT PHYSICAL THERAPY FEMALE PELVIC TREATMENT   Patient Name: Michelle Gray MRN: [id number] DOB:May 15, 1955, 68 y.o., female Today's Date: 06/01/2023  END OF SESSION:  PT End of Session - 06/01/23 0800     Visit Number 26    Date for PT Re-Evaluation 07/05/23    Authorization Type [employer] - Medicare    Progress Note Due on Visit 30    PT Start Time 0800    PT Stop Time 0840    PT Time Calculation (min) 40 min    Activity Tolerance Patient tolerated treatment well    Behavior During Therapy [facility] for tasks assessed/performed              Past Medical History:  Diagnosis Date   Arthritis    Asthma    Cancer (HCC) 1990   right breast ca-mast with reconstr   Cancer [facility]) 12/2020   left breast DCIS   Eczema    dx by derm per patient   Fibromyalgia    Hyperlipidemia    Hypothyroidism    Neuromuscular disorder (HCC)    Osteoporosis    PONV (postoperative nausea and vomiting)    PVC (premature ventricular contraction)    Thyroid  disease    Past Surgical History:  Procedure Laterality Date   APPENDECTOMY     ARTHRODESIS METATARSALPHALANGEAL JOINT (MTPJ) Left 10/15/2022   Procedure: ARTHRODESIS METATARSALPHALANGEAL JOINT (MTPJ);  Surgeon: Amada Backer, MD;  Location: [facility];  Service: Orthopedics;  Laterality: Left;   BREAST IMPLANT REMOVAL Right 02/11/2021   Procedure: REMOVAL RIGHT BREAST IMPLANT;  Surgeon: Alger Infield, MD;  Location: [facility];  Service: Plastics;  Laterality: Right;   BREAST SURGERY Right 1990   Mastectomy   CAPSULECTOMY Right 02/11/2021   Procedure: CAPSULECTOMY;  Surgeon: Alger Infield, MD;  Location: [facility];  Service: Plastics;  Laterality: Right;   FOOT SURGERY     LAPROSCOPIC     TOTAL MASTECTOMY Left 02/11/2021   Procedure: LEFT TOTAL MASTECTOMY;  Surgeon: Enid Harry, MD;  Location: [facility];  Service: General;  Laterality: Left;   WISDOM TEETH REMOVAL      Patient Active Problem List   Diagnosis Date Noted   Breast cancer, left breast (HCC) 02/11/2021   Cough variant asthma with ? component upper airway cough syndrome 12/23/2018   Primary osteoarthritis of both hands 02/21/2016   Fibromyalgia 02/21/2016   History of hypothyroidism 02/21/2016   Dyslipidemia 02/21/2016   History of breast cancer 02/21/2016   Age-related osteoporosis without current pathological fracture 02/21/2016   Hypothyroidism 08/06/2013   Other and unspecified hyperlipidemia 08/06/2013   Insomnia 08/06/2013   Intrinsic asthma 08/06/2013   Hip pain, left 01/12/2011   Plantar fasciitis 01/12/2011    PCP: Helyn Lobstein, MD  REFERRING PROVIDER: Terri Fester, MD  REFERRING DIAG: R29.898 (ICD-10-CM) - Other symptoms and signs involving the musculoskeletal system  THERAPY DIAG:  Pain in left hip  Unspecified lack of coordination  Other muscle spasm  Pelvic pain  Other low back pain  Muscle weakness (generalized)  Rationale for Evaluation and Treatment: Rehabilitation  ONSET DATE: 6 months   SUBJECTIVE:  SUBJECTIVE STATEMENT: Pt states that she was very sore for 4 days after dry needling last session without great improvement.   PAIN: 06/01/23 Are you having pain? Yes NPRS scale: 2/10 Pain location:  pelvic pain, Lt side, wraps around into groin and Lt side of sacrum   Pain type: aching and burning Pain description: intermittent   Aggravating factors: sitting, later in the day Relieving factors: stretches (pelvic tilts, hamstring stretches), ice  PRECAUTIONS: None  RED FLAGS: None   WEIGHT BEARING RESTRICTIONS: No  FALLS:  Has patient fallen in last 6 months? No  LIVING ENVIRONMENT: Lives with: lives with their family Lives in:  House/apartment   OCCUPATION: retired  PLOF: Independent  PATIENT GOALS: decrease Lt hip/pelvic pain  PERTINENT HISTORY:  Appendectomy, double mastectomy/breast cancer  BOWEL MOVEMENT: Pain with bowel movement: No Type of bowel movement:Frequency 1x/day and Strain No Fully empty rectum: Yes: - Leakage: No Pads: No Fiber supplement: No  URINATION: Pain with urination: No Fully empty bladder: Yes: - Stream: Strong Urgency: Yes: occasional Frequency: unsure daily, 1-2x/night Leakage:  none  - she is now having some stress incontinence since foot surgery.  Pads: No  INTERCOURSE: Pain with intercourse:  No pain Ability to have vaginal penetration:  Yes: - Climax: WNL Marinoff Scale: 0/3  PREGNANCY: None  PROLAPSE: none   OBJECTIVE:  06/01/23 Decreased lumbar lordosis  Significant bil hip flexor restriction  04/12/23 Improving Lt adductor restriction, obturator restriction Still tender at Lt ischial tuberosity and inferior pubic ramus  Notable restriction in Rt obliques/lats/lumbar paraspinals  02/24/23 Significant restriction in Lt obturator internus with tenderness Adductor restriction and fascial restriction over inside of ischial tuberosity and obturator foramen  Trigger points throughout Lt glutes and tender sacral attachments.   12/22/22                             Internal Pelvic Floor burning around superficial pelvic floor layers; pain/tightness in Lt levator ani  Patient confirms identification and approves PT to assess internal pelvic floor and treatment Yes  PELVIC MMT:   MMT eval  Vaginal 4/5, 8 second endurance, 6 repeat contractions with decreasing coordination  (Blank rows = not tested)        TONE: Increased tone in Lt deep pelvic floor  PROLAPSE: WNL  09/28/22: COGNITION: Overall cognitive status: Within functional limits for tasks assessed     SENSATION: Light touch: Appears intact Proprioception: Appears intact  FUNCTIONAL  TESTS:  Squat:Rt valgus knee collapse, anterior weight shift Single leg stance: Lt, pelvic collapse on Rt, 5 seconds; Rt stable pelvis >10 seconds  GAIT: Comments: WNL  POSTURE: rounded shoulders, forward head, decreased lumbar lordosis, decreased thoracic kyphosis, anterior pelvic tilt, and Lt lower thoracolumbar scoliosis  LUMBARAROM/PROM:  A/PROM A/PROM  Eval (% available)  Flexion 75  Extension 100  Right lateral flexion 50  Left lateral flexion 50  Right rotation 50  Left rotation 50   (Blank rows = not tested)  PALPATION:   General  significant Lt chest wall scar tissue that is limiting rib cage mobility; decreased bil lateral rib cage excursion, generalized abdominal tightness likely due to bracing form low back pain and fear of movement                External Perineal Exam WNL  Internal Pelvic Floor some burning at posterior fourchette; some mild tenderness and increased tension in Lt levator ani; mild atrophy in Rt levator ani  Patient confirms identification and approves PT to assess internal pelvic floor and treatment Yes  PELVIC MMT:   MMT eval  Vaginal 3/5, 2 second endurance, 6 repeat contractions with decreasing coordination  Internal Anal Sphincter   External Anal Sphincter   Puborectalis   Diastasis Recti WNL  (Blank rows = not tested)        TONE: Slight increase in Rt levator ani  PROLAPSE: WNL  TODAY'S TREATMENT:                                                                                                                              DATE:  06/01/23 Manual: Prone PA mobilizations grade 1-3 L1-S1 Neuromuscular re-education: Seated hip adduction ball press with transversus abdominus and pelvic floor muscle 2 x 10 Seated hip abduction red band with transversus abdominus and pelvic floor muscle 2 x 10 Seated resisted march red band with transversus abdominus and pelvic floor muscle 2 x 10 Exercises: Prone hip flexor  stretching with overpressure 3 minutes bil Seated backward reaches in sitting 10x bil Open books 10x bil Unilateral chest press in side lying rotation 10x bil 5lbs    05/25/23 Manual: Rt side lying soft tissue mobilization to lumbar paraspinals, quadratus lumborum, obliques Trigger Point Dry Needling  Subsequent Treatment: Instructions provided previously at initial dry needling treatment.  Instructions reviewed, if requested by the patient, prior to subsequent dry needling treatment.   Patient Verbal Consent Given: Yes Education Handout Provided: Previously Provided Muscles Treated: Lt piriformis Electrical Stimulation Performed: No Treatment Response/Outcome: twitch response and release  Manual trigger point release to Lt glutes and piriformis Exercises: Seated wide leg twist 10x Seated lateral side bending 8x bil Standing hip circles 10x bil Therapeutic activities: Squats to table 2 x 10   05/18/23 Therapeutic activities: Standing shoulder extensions green band 3 x 10 Standing pallof press red band 2 x 10 bil  Bent unilateral rows 2 x 10 bil 4lbs  Straight leg dead lifts 15 lbs 4 x 10 with significant cues for core activation, thoracic paraspinal activation, and good form Sit to stands   PATIENT EDUCATION:  Education details: See above Person educated: Patient Education method: Explanation, Demonstration, Tactile cues, Verbal cues, and Handouts Education comprehension: verbalized understanding  HOME EXERCISE PROGRAM: Written handout  ASSESSMENT:  CLINICAL IMPRESSION: Pt continuing to have lingering low back pain that feels achy. She is being compliant with HEP and performing regularly. She sometimes has relief with lumbar flexion and at other times this will increase her pain. Pelvic alignment appears normal at this time. She has reduced lumbar curvature and kyphotic/forward flexed posture overall. She had some relief with PA mobilization to L5/S1 today. Significant  restriction in bil hip flexors, but good tolerance to stretching today. We focused on some pelvic stability exercises and strengthening  in rotation. She tolerated all treatment well today and reported no increase in pain. She will continue to benefit from skilled PT intervention in order to improve pain, decrease abdominal and chest scar tissue restriction, and begin/progress functional strengthening program.   OBJECTIVE IMPAIRMENTS: decreased activity tolerance, decreased coordination, decreased endurance, decreased strength, increased fascial restrictions, increased muscle spasms, impaired tone, postural dysfunction, and pain.   ACTIVITY LIMITATIONS: bending, sitting, squatting, and locomotion level  PARTICIPATION LIMITATIONS: community activity  PERSONAL FACTORS: 1 comorbidity: medical history   are also affecting patient's functional outcome.   REHAB POTENTIAL: Good  CLINICAL DECISION MAKING: Stable/uncomplicated  EVALUATION COMPLEXITY: Low   GOALS: Goals reviewed with patient? Yes  SHORT TERM GOALS: Updated 05/18/23  Pt will be independent with HEP.   Baseline: Goal status: MET 02/02/23  2.  Pt will be independent with diaphragmatic breathing and down training activities in order to improve pelvic floor/abdominal relaxation.  Baseline:  Goal status: MET 02/02/23  3.  Pt will be independent with chest wall scar tissue mobilization techniques and perform at least 2x/week.  Baseline:  Goal status: IN PROGRESS 05/18/23   LONG TERM GOALS: updated 05/18/23  Pt will be independent with advanced HEP.   Baseline:  Goal status: IN PROGRESS 05/18/23  2.  Pt will demonstrate normal pelvic floor muscle tone and A/ROM, able to achieve 4/5 strength with contractions and 10 sec endurance, in order to provide appropriate lumbopelvic support in functional activities.   Baseline: 4/5 strength, still working on endurance Goal status: IN PROGRESS 05/18/23  3.  Pt will report no Lt pelvic  pain higher than 3/10.  Baseline: patient is having higher pain levels over the last several weeks exceeding 3/10 Goal status: MET 04/12/23  4.  Pt will be able to sit for longer than 60 minutes without increase in pelvic pain. Baseline: driving long distances still aggravating and any prolonged sitting Goal status: IN PROGRESS 05/18/23  5.  Pt will be able to perform single leg stance >20 seconds bil without pelvic drop.  Baseline:  Goal status: IN PROGRESS 05/18/23  6.  Pt will increase all impaired lumbar A/ROM by 25% without pain.  Baseline:  Goal status: IN PROGRESS 05/18/23  PLAN:  PT FREQUENCY: 1-2x/week  PT DURATION: 12 weeks  PLANNED INTERVENTIONS: Therapeutic exercises, Therapeutic activity, Neuromuscular re-education, Balance training, Gait training, Patient/Family education, Self Care, Joint mobilization, Dry Needling, Biofeedback, and Manual therapy  PLAN FOR NEXT SESSION: Continue core training; mobility exercises for pelvic floor; possible dry needling/myofascial release to Lt pelvic girdle    Verlena Glenn, PT, DPT04/29/258:42 AM

## 2023-06-04 ENCOUNTER — Encounter: Payer: Self-pay | Admitting: Cardiology

## 2023-06-04 NOTE — Telephone Encounter (Signed)
 Yes that is fine, we can switch to diltiazem  120 mg daily and send in refill

## 2023-06-07 ENCOUNTER — Other Ambulatory Visit: Payer: Self-pay | Admitting: *Deleted

## 2023-06-07 MED ORDER — DILTIAZEM HCL ER COATED BEADS 120 MG PO CP24: 120.0000 mg | ORAL_CAPSULE | Freq: Every day | ORAL | 3 refills | Status: DC

## 2023-06-07 NOTE — Progress Notes (Signed)
 Per Dr. Alda Amas Diltiazem  120 mg sent to [facility] pharmacy as patient requested.

## 2023-06-08 ENCOUNTER — Ambulatory Visit: Payer: Medicare Other | Attending: Obstetrics & Gynecology

## 2023-06-08 DIAGNOSIS — R102 Pelvic and perineal pain unspecified side: Secondary | ICD-10-CM

## 2023-06-08 DIAGNOSIS — M5459 Other low back pain: Secondary | ICD-10-CM

## 2023-06-08 DIAGNOSIS — R279 Unspecified lack of coordination: Secondary | ICD-10-CM | POA: Diagnosis not present

## 2023-06-08 DIAGNOSIS — M6281 Muscle weakness (generalized): Secondary | ICD-10-CM

## 2023-06-08 DIAGNOSIS — M62838 Other muscle spasm: Secondary | ICD-10-CM

## 2023-06-08 DIAGNOSIS — M25552 Pain in left hip: Secondary | ICD-10-CM | POA: Diagnosis not present

## 2023-06-08 NOTE — Therapy (Signed)
 OUTPATIENT PHYSICAL THERAPY FEMALE PELVIC TREATMENT   Patient Name: Michelle Gray MRN: [id number] DOB:September 02, 1955, 68 y.o., female Today's Date: 06/08/2023  END OF SESSION:  PT End of Session - 06/08/23 0846     Visit Number 27    Date for PT Re-Evaluation 07/05/23    Authorization Type [employer] - Medicare    Progress Note Due on Visit 30    PT Start Time 0845    PT Stop Time 0925    PT Time Calculation (min) 40 min    Activity Tolerance Patient tolerated treatment well    Behavior During Therapy [facility] for tasks assessed/performed              Past Medical History:  Diagnosis Date   Arthritis    Asthma    Cancer (HCC) 1990   right breast ca-mast with reconstr   Cancer [facility]) 12/2020   left breast DCIS   Eczema    dx by derm per patient   Fibromyalgia    Hyperlipidemia    Hypothyroidism    Neuromuscular disorder (HCC)    Osteoporosis    PONV (postoperative nausea and vomiting)    PVC (premature ventricular contraction)    Thyroid  disease    Past Surgical History:  Procedure Laterality Date   APPENDECTOMY     ARTHRODESIS METATARSALPHALANGEAL JOINT (MTPJ) Left 10/15/2022   Procedure: ARTHRODESIS METATARSALPHALANGEAL JOINT (MTPJ);  Surgeon: Amada Backer, MD;  Location: [facility];  Service: Orthopedics;  Laterality: Left;   BREAST IMPLANT REMOVAL Right 02/11/2021   Procedure: REMOVAL RIGHT BREAST IMPLANT;  Surgeon: Alger Infield, MD;  Location: [facility];  Service: Plastics;  Laterality: Right;   BREAST SURGERY Right 1990   Mastectomy   CAPSULECTOMY Right 02/11/2021   Procedure: CAPSULECTOMY;  Surgeon: Alger Infield, MD;  Location: [facility];  Service: Plastics;  Laterality: Right;   FOOT SURGERY     LAPROSCOPIC     TOTAL MASTECTOMY Left 02/11/2021   Procedure: LEFT TOTAL MASTECTOMY;  Surgeon: Enid Harry, MD;  Location: [facility];  Service: General;  Laterality: Left;   WISDOM TEETH REMOVAL      Patient Active Problem List   Diagnosis Date Noted   Breast cancer, left breast (HCC) 02/11/2021   Cough variant asthma with ? component upper airway cough syndrome 12/23/2018   Primary osteoarthritis of both hands 02/21/2016   Fibromyalgia 02/21/2016   History of hypothyroidism 02/21/2016   Dyslipidemia 02/21/2016   History of breast cancer 02/21/2016   Age-related osteoporosis without current pathological fracture 02/21/2016   Hypothyroidism 08/06/2013   Other and unspecified hyperlipidemia 08/06/2013   Insomnia 08/06/2013   Intrinsic asthma 08/06/2013   Hip pain, left 01/12/2011   Plantar fasciitis 01/12/2011    PCP: Helyn Lobstein, MD  REFERRING PROVIDER: Terri Fester, MD  REFERRING DIAG: R29.898 (ICD-10-CM) - Other symptoms and signs involving the musculoskeletal system  THERAPY DIAG:  Pain in left hip  Unspecified lack of coordination  Other muscle spasm  Pelvic pain  Other low back pain  Muscle weakness (generalized)  Rationale for Evaluation and Treatment: Rehabilitation  ONSET DATE: 6 months   SUBJECTIVE:  SUBJECTIVE STATEMENT: Pt states that she feels like obturator is still the pinpointed pain on her Lt side. She has not done as many exercises this week.   PAIN: 06/08/23 Are you having pain? Yes NPRS scale: 2/10 Pain location:  pelvic pain, Lt side, wraps around into groin and Lt side of sacrum   Pain type: aching and burning Pain description: intermittent   Aggravating factors: sitting, later in the day Relieving factors: stretches (pelvic tilts, hamstring stretches), ice  PRECAUTIONS: None  RED FLAGS: None   WEIGHT BEARING RESTRICTIONS: No  FALLS:  Has patient fallen in last 6 months? No  LIVING ENVIRONMENT: Lives with: lives with their family Lives  in: House/apartment   OCCUPATION: retired  PLOF: Independent  PATIENT GOALS: decrease Lt hip/pelvic pain  PERTINENT HISTORY:  Appendectomy, double mastectomy/breast cancer  BOWEL MOVEMENT: Pain with bowel movement: No Type of bowel movement:Frequency 1x/day and Strain No Fully empty rectum: Yes: - Leakage: No Pads: No Fiber supplement: No  URINATION: Pain with urination: No Fully empty bladder: Yes: - Stream: Strong Urgency: Yes: occasional Frequency: unsure daily, 1-2x/night Leakage:  none  - she is now having some stress incontinence since foot surgery.  Pads: No  INTERCOURSE: Pain with intercourse:  No pain Ability to have vaginal penetration:  Yes: - Climax: WNL Marinoff Scale: 0/3  PREGNANCY: None  PROLAPSE: none   OBJECTIVE:  06/01/23 Decreased lumbar lordosis  Significant bil hip flexor restriction  04/12/23 Improving Lt adductor restriction, obturator restriction Still tender at Lt ischial tuberosity and inferior pubic ramus  Notable restriction in Rt obliques/lats/lumbar paraspinals  02/24/23 Significant restriction in Lt obturator internus with tenderness Adductor restriction and fascial restriction over inside of ischial tuberosity and obturator foramen  Trigger points throughout Lt glutes and tender sacral attachments.   12/22/22                             Internal Pelvic Floor burning around superficial pelvic floor layers; pain/tightness in Lt levator ani  Patient confirms identification and approves PT to assess internal pelvic floor and treatment Yes  PELVIC MMT:   MMT eval  Vaginal 4/5, 8 second endurance, 6 repeat contractions with decreasing coordination  (Blank rows = not tested)        TONE: Increased tone in Lt deep pelvic floor  PROLAPSE: WNL  09/28/22: COGNITION: Overall cognitive status: Within functional limits for tasks assessed     SENSATION: Light touch: Appears intact Proprioception: Appears  intact  FUNCTIONAL TESTS:  Squat:Rt valgus knee collapse, anterior weight shift Single leg stance: Lt, pelvic collapse on Rt, 5 seconds; Rt stable pelvis >10 seconds  GAIT: Comments: WNL  POSTURE: rounded shoulders, forward head, decreased lumbar lordosis, decreased thoracic kyphosis, anterior pelvic tilt, and Lt lower thoracolumbar scoliosis  LUMBARAROM/PROM:  A/PROM A/PROM  Eval (% available)  Flexion 75  Extension 100  Right lateral flexion 50  Left lateral flexion 50  Right rotation 50  Left rotation 50   (Blank rows = not tested)  PALPATION:   General  significant Lt chest wall scar tissue that is limiting rib cage mobility; decreased bil lateral rib cage excursion, generalized abdominal tightness likely due to bracing form low back pain and fear of movement                External Perineal Exam WNL  Internal Pelvic Floor some burning at posterior fourchette; some mild tenderness and increased tension in Lt levator ani; mild atrophy in Rt levator ani  Patient confirms identification and approves PT to assess internal pelvic floor and treatment Yes  PELVIC MMT:   MMT eval  Vaginal 3/5, 2 second endurance, 6 repeat contractions with decreasing coordination  Internal Anal Sphincter   External Anal Sphincter   Puborectalis   Diastasis Recti WNL  (Blank rows = not tested)        TONE: Slight increase in Rt levator ani  PROLAPSE: WNL  TODAY'S TREATMENT:                                                                                                                              DATE:  06/08/23 Manual: External obturator internus trigger point release and soft tissue mobilization in Lt side lying Neuromuscular re-education: Staggered pallof press red band 2 x 10 bil Standing shoulder extension red band 3 x 10 Standing 3 way kick 10x each, bil  Standing lateral lunge with cues for form and better mobility 12x  bil   06/01/23 Manual: Prone PA mobilizations grade 1-3 L1-S1 Neuromuscular re-education: Seated hip adduction ball press with transversus abdominus and pelvic floor muscle 2 x 10 Seated hip abduction red band with transversus abdominus and pelvic floor muscle 2 x 10 Seated resisted march red band with transversus abdominus and pelvic floor muscle 2 x 10 Exercises: Prone hip flexor stretching with overpressure 3 minutes bil Seated backward reaches in sitting 10x bil Open books 10x bil Unilateral chest press in side lying rotation 10x bil 5lbs    05/25/23 Manual: Rt side lying soft tissue mobilization to lumbar paraspinals, quadratus lumborum, obliques Trigger Point Dry Needling  Subsequent Treatment: Instructions provided previously at initial dry needling treatment.  Instructions reviewed, if requested by the patient, prior to subsequent dry needling treatment.   Patient Verbal Consent Given: Yes Education Handout Provided: Previously Provided Muscles Treated: Lt piriformis Electrical Stimulation Performed: No Treatment Response/Outcome: twitch response and release  Manual trigger point release to Lt glutes and piriformis Exercises: Seated wide leg twist 10x Seated lateral side bending 8x bil Standing hip circles 10x bil Therapeutic activities: Squats to table 2 x 10   PATIENT EDUCATION:  Education details: See above Person educated: Patient Education method: Programmer, multimedia, Facilities manager, Actor cues, Verbal cues, and Handouts Education comprehension: verbalized understanding  HOME EXERCISE PROGRAM: Written handout  ASSESSMENT:  CLINICAL IMPRESSION: Pt doing better with Lt hip and low back pain, but still feels pelvic pain at Lt obturator internus. We performed external release to this area in Lt side lying with notable palpation of trigger points in obturator internus and good release. She tolerated very well reporting relief afterwards. She was able to continue to  progress functional strengthening; pallof press performed in staggered stance to help facilitate adductor contraction. Good tolerance to all exercises. She will continue to benefit from skilled PT intervention  in order to improve pain, decrease abdominal and chest scar tissue restriction, and begin/progress functional strengthening program.   OBJECTIVE IMPAIRMENTS: decreased activity tolerance, decreased coordination, decreased endurance, decreased strength, increased fascial restrictions, increased muscle spasms, impaired tone, postural dysfunction, and pain.   ACTIVITY LIMITATIONS: bending, sitting, squatting, and locomotion level  PARTICIPATION LIMITATIONS: community activity  PERSONAL FACTORS: 1 comorbidity: medical history   are also affecting patient's functional outcome.   REHAB POTENTIAL: Good  CLINICAL DECISION MAKING: Stable/uncomplicated  EVALUATION COMPLEXITY: Low   GOALS: Goals reviewed with patient? Yes  SHORT TERM GOALS: Updated 05/18/23  Pt will be independent with HEP.   Baseline: Goal status: MET 02/02/23  2.  Pt will be independent with diaphragmatic breathing and down training activities in order to improve pelvic floor/abdominal relaxation.  Baseline:  Goal status: MET 02/02/23  3.  Pt will be independent with chest wall scar tissue mobilization techniques and perform at least 2x/week.  Baseline:  Goal status: IN PROGRESS 05/18/23   LONG TERM GOALS: updated 05/18/23  Pt will be independent with advanced HEP.   Baseline:  Goal status: IN PROGRESS 05/18/23  2.  Pt will demonstrate normal pelvic floor muscle tone and A/ROM, able to achieve 4/5 strength with contractions and 10 sec endurance, in order to provide appropriate lumbopelvic support in functional activities.   Baseline: 4/5 strength, still working on endurance Goal status: IN PROGRESS 05/18/23  3.  Pt will report no Lt pelvic pain higher than 3/10.  Baseline: patient is having higher pain  levels over the last several weeks exceeding 3/10 Goal status: MET 04/12/23  4.  Pt will be able to sit for longer than 60 minutes without increase in pelvic pain. Baseline: driving long distances still aggravating and any prolonged sitting Goal status: IN PROGRESS 05/18/23  5.  Pt will be able to perform single leg stance >20 seconds bil without pelvic drop.  Baseline:  Goal status: IN PROGRESS 05/18/23  6.  Pt will increase all impaired lumbar A/ROM by 25% without pain.  Baseline:  Goal status: IN PROGRESS 05/18/23  PLAN:  PT FREQUENCY: 1-2x/week  PT DURATION: 12 weeks  PLANNED INTERVENTIONS: Therapeutic exercises, Therapeutic activity, Neuromuscular re-education, Balance training, Gait training, Patient/Family education, Self Care, Joint mobilization, Dry Needling, Biofeedback, and Manual therapy  PLAN FOR NEXT SESSION: Continue core training; mobility exercises for pelvic floor; possible dry needling/myofascial release to Lt pelvic girdle    Verlena Glenn, PT, DPT05/06/259:19 AM

## 2023-06-15 DIAGNOSIS — H40013 Open angle with borderline findings, low risk, bilateral: Secondary | ICD-10-CM | POA: Diagnosis not present

## 2023-06-25 ENCOUNTER — Other Ambulatory Visit: Payer: Self-pay | Admitting: Physician Assistant

## 2023-06-25 ENCOUNTER — Other Ambulatory Visit: Payer: Self-pay | Admitting: Rheumatology

## 2023-06-25 NOTE — Telephone Encounter (Signed)
 Last Fill: 05/25/2023  Next Visit: 07/15/2023  Last Visit: 01/14/2023  Dx: Primary insomnia   Current Dose per office note on 01/14/2023: Ambien  10 mg p.o. nightly.   Okay to refill Ambien ?

## 2023-06-25 NOTE — Telephone Encounter (Signed)
 Last Fill: 11/27/2022  Next Visit: 07/15/2023  Last Visit: 01/14/2023  Dx: Fibromyalgia   Current Dose per office note on 01/14/2023: She takes gabapentin  300 mg p.o. nightly. She was previously on gabapentin  300 mg 2 tablets p.o. nightly. She states she has reduced the dose.   Contacted the patient and she is only taking one Gabapentin  tablet at night now. Prescription changed to reflect one tablet at night.   Okay to refill Gabapentin ?

## 2023-07-01 NOTE — Progress Notes (Deleted)
 Office Visit Note  Patient: Michelle Gray             Date of Birth: [birth year]           MRN: [id number]             PCP: Helyn Lobstein, MD Referring: Helyn Lobstein, MD Visit Date: 07/15/2023 Occupation: @GUAROCC @  Subjective:  No chief complaint on file.   History of Present Illness: Michelle Gray is a 68 y.o. female ***     Activities of Daily Living:  Patient reports morning stiffness for *** {minute/hour:19697}.   Patient {ACTIONS;DENIES/REPORTS:21021675::"Denies"} nocturnal pain.  Difficulty dressing/grooming: {ACTIONS;DENIES/REPORTS:21021675::"Denies"} Difficulty climbing stairs: {ACTIONS;DENIES/REPORTS:21021675::"Denies"} Difficulty getting out of chair: {ACTIONS;DENIES/REPORTS:21021675::"Denies"} Difficulty using hands for taps, buttons, cutlery, and/or writing: {ACTIONS;DENIES/REPORTS:21021675::"Denies"}  No Rheumatology ROS completed.   PMFS History:  Patient Active Problem List   Diagnosis Date Noted   Breast cancer, left breast (HCC) 02/11/2021   Cough variant asthma with ? component upper airway cough syndrome 12/23/2018   Primary osteoarthritis of both hands 02/21/2016   Fibromyalgia 02/21/2016   History of hypothyroidism 02/21/2016   Dyslipidemia 02/21/2016   History of breast cancer 02/21/2016   Age-related osteoporosis without current pathological fracture 02/21/2016   Hypothyroidism 08/06/2013   Other and unspecified hyperlipidemia 08/06/2013   Insomnia 08/06/2013   Intrinsic asthma 08/06/2013   Hip pain, left 01/12/2011   Plantar fasciitis 01/12/2011    Past Medical History:  Diagnosis Date   Arthritis    Asthma    Cancer (HCC) 1990   right breast ca-mast with reconstr   Cancer [facility]) 12/2020   left breast DCIS   Eczema    dx by derm per patient   Fibromyalgia    Hyperlipidemia    Hypothyroidism    Neuromuscular disorder (HCC)    Osteoporosis    PONV (postoperative nausea and vomiting)    PVC (premature ventricular  contraction)    Thyroid  disease     Family History  Problem Relation Age of Onset   Cancer Mother    Hyperlipidemia Mother    Hypertension Mother    Stroke Mother    Dementia Mother    Hyperlipidemia Father    Stroke Father    Prostate cancer Father    Cancer Father 107       Prostate   Hypertension Brother    Past Surgical History:  Procedure Laterality Date   APPENDECTOMY     ARTHRODESIS METATARSALPHALANGEAL JOINT (MTPJ) Left 10/15/2022   Procedure: ARTHRODESIS METATARSALPHALANGEAL JOINT (MTPJ);  Surgeon: Amada Backer, MD;  Location: [facility];  Service: Orthopedics;  Laterality: Left;   BREAST IMPLANT REMOVAL Right 02/11/2021   Procedure: REMOVAL RIGHT BREAST IMPLANT;  Surgeon: Alger Infield, MD;  Location: [facility];  Service: Plastics;  Laterality: Right;   BREAST SURGERY Right 1990   Mastectomy   CAPSULECTOMY Right 02/11/2021   Procedure: CAPSULECTOMY;  Surgeon: Alger Infield, MD;  Location: [facility];  Service: Plastics;  Laterality: Right;   FOOT SURGERY     LAPROSCOPIC     TOTAL MASTECTOMY Left 02/11/2021   Procedure: LEFT TOTAL MASTECTOMY;  Surgeon: Enid Harry, MD;  Location: [facility];  Service: General;  Laterality: Left;   WISDOM TEETH REMOVAL     Social History   Social History Narrative   Not on file   Immunization History  Administered Date(s) Administered   Influenza-Unspecified 10/04/2018   PFIZER(Purple Top)SARS-COV-2 Vaccination 01/31/2019, 02/21/2019, 11/04/2019, 05/18/2020   Pfizer(Comirnaty)Fall Seasonal  Vaccine 12 years and older 05/26/2023     Objective: Vital Signs: There were no vitals taken for this visit.   Physical Exam   Musculoskeletal Exam: ***  CDAI Exam: CDAI Score: -- Patient Global: --; Provider Global: -- Swollen: --; Tender: -- Joint Exam 07/15/2023   No joint exam has been documented for this visit   There is currently no information  documented on the homunculus. Go to the Rheumatology activity and complete the homunculus joint exam.  Investigation: No additional findings.  Imaging: No results found.  Recent Labs: Lab Results  Component Value Date   WBC 16.8 (H) 08/06/2013   HGB 13.4 08/06/2013   PLT 391 08/06/2013    Speciality Comments: No specialty comments available.  Procedures:  No procedures performed Allergies: Compazine and Tape   Assessment / Plan:     Visit Diagnoses: No diagnosis found.  Orders: No orders of the defined types were placed in this encounter.  No orders of the defined types were placed in this encounter.   Face-to-face time spent with patient was *** minutes. Greater than 50% of time was spent in counseling and coordination of care.  Follow-Up Instructions: No follow-ups on file.   Dee Farber, CMA  Note - This record has been created using Animal nutritionist.  Chart creation errors have been sought, but may not always  have been located. Such creation errors do not reflect on  the standard of medical care.

## 2023-07-02 ENCOUNTER — Other Ambulatory Visit: Payer: Self-pay | Admitting: Cardiology

## 2023-07-05 ENCOUNTER — Other Ambulatory Visit: Payer: Self-pay | Admitting: *Deleted

## 2023-07-05 MED ORDER — MELOXICAM 15 MG PO TABS: 15.0000 mg | ORAL_TABLET | Freq: Every day | ORAL | 0 refills | Status: DC

## 2023-07-05 NOTE — Telephone Encounter (Signed)
 Last Fill: 04/12/2023  Labs:  Labs received from:[facility] on: 05/20/2023   Reviewed by: Dr. Nicholas Bari    Labs drawn: CBC, CMP, TSH, Lipid Panel   Results: RBC 4.12   MCH 33.2   Mono % 15.0   Mono # 0.9   HDLD 96   Chol/HDL Ratio 1.9     Next Visit: 07/15/2023  Last Visit: 01/14/2023  DX: Fibromyalgia   Current Dose per office note 01/14/2023: meloxicam  15 mg p.o. daily   Okay to refill Meloxicam ?

## 2023-07-13 ENCOUNTER — Encounter: Payer: Self-pay | Admitting: Rheumatology

## 2023-07-13 ENCOUNTER — Ambulatory Visit: Attending: Rheumatology | Admitting: Rheumatology

## 2023-07-13 VITALS — BP 117/71 | HR 67 | Resp 14 | Ht 67.25 in | Wt 169.0 lb

## 2023-07-13 DIAGNOSIS — M19071 Primary osteoarthritis, right ankle and foot: Secondary | ICD-10-CM | POA: Insufficient documentation

## 2023-07-13 DIAGNOSIS — M47816 Spondylosis without myelopathy or radiculopathy, lumbar region: Secondary | ICD-10-CM | POA: Diagnosis not present

## 2023-07-13 DIAGNOSIS — M19041 Primary osteoarthritis, right hand: Secondary | ICD-10-CM | POA: Diagnosis not present

## 2023-07-13 DIAGNOSIS — Z8639 Personal history of other endocrine, nutritional and metabolic disease: Secondary | ICD-10-CM | POA: Diagnosis not present

## 2023-07-13 DIAGNOSIS — I7300 Raynaud's syndrome without gangrene: Secondary | ICD-10-CM | POA: Diagnosis not present

## 2023-07-13 DIAGNOSIS — M19072 Primary osteoarthritis, left ankle and foot: Secondary | ICD-10-CM | POA: Insufficient documentation

## 2023-07-13 DIAGNOSIS — M7062 Trochanteric bursitis, left hip: Secondary | ICD-10-CM | POA: Insufficient documentation

## 2023-07-13 DIAGNOSIS — Z853 Personal history of malignant neoplasm of breast: Secondary | ICD-10-CM | POA: Insufficient documentation

## 2023-07-13 DIAGNOSIS — M8589 Other specified disorders of bone density and structure, multiple sites: Secondary | ICD-10-CM | POA: Diagnosis not present

## 2023-07-13 DIAGNOSIS — F5101 Primary insomnia: Secondary | ICD-10-CM | POA: Diagnosis not present

## 2023-07-13 DIAGNOSIS — M19042 Primary osteoarthritis, left hand: Secondary | ICD-10-CM | POA: Insufficient documentation

## 2023-07-13 DIAGNOSIS — R5383 Other fatigue: Secondary | ICD-10-CM | POA: Insufficient documentation

## 2023-07-13 DIAGNOSIS — M722 Plantar fascial fibromatosis: Secondary | ICD-10-CM | POA: Insufficient documentation

## 2023-07-13 DIAGNOSIS — M797 Fibromyalgia: Secondary | ICD-10-CM | POA: Insufficient documentation

## 2023-07-13 NOTE — Patient Instructions (Signed)
 Low Back Sprain or Strain Rehab Ask your health care provider which exercises are safe for you. Do exercises exactly as told by your health care provider and adjust them as directed. It is normal to feel mild stretching, pulling, tightness, or discomfort as you do these exercises. Stop right away if you feel sudden pain or your pain gets worse. Do not begin these exercises until told by your health care provider. Stretching and range-of-motion exercises These exercises warm up your muscles and joints and improve the movement and flexibility of your back. These exercises also help to relieve pain, numbness, and tingling. Lumbar rotation  Lie on your back on a firm bed or the floor with your knees bent. Straighten your arms out to your sides so each arm forms a 90-degree angle (right angle) with a side of your body. Slowly move (rotate) both of your knees to one side of your body until you feel a stretch in your lower back (lumbar). Try not to let your shoulders lift off the floor. Hold this position for __________ seconds. Tense your abdominal muscles and slowly move your knees back to the starting position. Repeat this exercise on the other side of your body. Repeat __________ times. Complete this exercise __________ times a day. Single knee to chest  Lie on your back on a firm bed or the floor with both legs straight. Bend one of your knees. Use your hands to move your knee up toward your chest until you feel a gentle stretch in your lower back and buttock. Hold your leg in this position by holding on to the front of your knee. Keep your other leg as straight as possible. Hold this position for __________ seconds. Slowly return to the starting position. Repeat with your other leg. Repeat __________ times. Complete this exercise __________ times a day. Prone extension on elbows  Lie on your abdomen on a firm bed or the floor (prone position). Prop yourself up on your elbows. Use your arms  to help lift your chest up until you feel a gentle stretch in your abdomen and your lower back. This will place some of your body weight on your elbows. If this is uncomfortable, try stacking pillows under your chest. Your hips should stay down, against the surface that you are lying on. Keep your hip and back muscles relaxed. Hold this position for __________ seconds. Slowly relax your upper body and return to the starting position. Repeat __________ times. Complete this exercise __________ times a day. Strengthening exercises These exercises build strength and endurance in your back. Endurance is the ability to use your muscles for a long time, even after they get tired. Pelvic tilt This exercise strengthens the muscles that lie deep in the abdomen. Lie on your back on a firm bed or the floor with your legs extended. Bend your knees so they are pointing toward the ceiling and your feet are flat on the floor. Tighten your lower abdominal muscles to press your lower back against the floor. This motion will tilt your pelvis so your tailbone points up toward the ceiling instead of pointing to your feet or the floor. To help with this exercise, you may place a small towel under your lower back and try to push your back into the towel. Hold this position for __________ seconds. Let your muscles relax completely before you repeat this exercise. Repeat __________ times. Complete this exercise __________ times a day. Alternating arm and leg raises  Get on your hands  and knees on a firm surface. If you are on a hard floor, you may want to use padding, such as an exercise mat, to cushion your knees. Line up your arms and legs. Your hands should be directly below your shoulders, and your knees should be directly below your hips. Lift your left leg behind you. At the same time, raise your right arm and straighten it in front of you. Do not lift your leg higher than your hip. Do not lift your arm higher  than your shoulder. Keep your abdominal and back muscles tight. Keep your hips facing the ground. Do not arch your back. Keep your balance carefully, and do not hold your breath. Hold this position for __________ seconds. Slowly return to the starting position. Repeat with your right leg and your left arm. Repeat __________ times. Complete this exercise __________ times a day. Abdominal set with straight leg raise  Lie on your back on a firm bed or the floor. Bend one of your knees and keep your other leg straight. Tense your abdominal muscles and lift your straight leg up, 4-6 inches (10-15 cm) off the ground. Keep your abdominal muscles tight and hold this position for __________ seconds. Do not hold your breath. Do not arch your back. Keep it flat against the ground. Keep your abdominal muscles tense as you slowly lower your leg back to the starting position. Repeat with your other leg. Repeat __________ times. Complete this exercise __________ times a day. Single leg lower with bent knees Lie on your back on a firm bed or the floor. Tense your abdominal muscles and lift your feet off the floor, one foot at a time, so your knees and hips are bent in 90-degree angles (right angles). Your knees should be over your hips and your lower legs should be parallel to the floor. Keeping your abdominal muscles tense and your knee bent, slowly lower one of your legs so your toe touches the ground. Lift your leg back up to return to the starting position. Do not hold your breath. Do not let your back arch. Keep your back flat against the ground. Repeat with your other leg. Repeat __________ times. Complete this exercise __________ times a day. Posture and body mechanics Good posture and healthy body mechanics can help to relieve stress in your body's tissues and joints. Body mechanics refers to the movements and positions of your body while you do your daily activities. Posture is part of body  mechanics. Good posture means: Your spine is in its natural S-curve position (neutral). Your shoulders are pulled back slightly. Your head is not tipped forward (neutral). Follow these guidelines to improve your posture and body mechanics in your everyday activities. Standing  When standing, keep your spine neutral and your feet about hip-width apart. Keep a slight bend in your knees. Your ears, shoulders, and hips should line up. When you do a task in which you stand in one place for a long time, place one foot up on a stable object that is 2-4 inches (5-10 cm) high, such as a footstool. This helps keep your spine neutral. Sitting  When sitting, keep your spine neutral and keep your feet flat on the floor. Use a footrest, if necessary, and keep your thighs parallel to the floor. Avoid rounding your shoulders, and avoid tilting your head forward. When working at a desk or a computer, keep your desk at a height where your hands are slightly lower than your elbows. Slide your  chair under your desk so you are close enough to maintain good posture. When working at a computer, place your monitor at a height where you are looking straight ahead and you do not have to tilt your head forward or downward to look at the screen. Resting When lying down and resting, avoid positions that are most painful for you. If you have pain with activities such as sitting, bending, stooping, or squatting, lie in a position in which your body does not bend very much. For example, avoid curling up on your side with your arms and knees near your chest (fetal position). If you have pain with activities such as standing for a long time or reaching with your arms, lie with your spine in a neutral position and bend your knees slightly. Try the following positions: Lying on your side with a pillow between your knees. Lying on your back with a pillow under your knees. Lifting  When lifting objects, keep your feet at least  shoulder-width apart and tighten your abdominal muscles. Bend your knees and hips and keep your spine neutral. It is important to lift using the strength of your legs, not your back. Do not lock your knees straight out. Always ask for help to lift heavy or awkward objects. This information is not intended to replace advice given to you by your health care provider. Make sure you discuss any questions you have with your health care provider. Document Revised: 05/25/2022 Document Reviewed: 04/08/2020 Elsevier Patient Education  2024 [employer].

## 2023-07-13 NOTE — Progress Notes (Signed)
 Office Visit Note  Patient: Michelle Gray             Date of Birth: 03/14/1955           MRN: [id number]             PCP: Helyn Lobstein, MD Referring: Helyn Lobstein, MD Visit Date: 07/13/2023 Occupation: @GUAROCC @  Subjective:  Increased fatigue and joint pain  History of Present Illness: Michelle Gray is a 68 y.o. female with osteoarthritis, degenerative disc disease and fibromyalgia syndrome.  She returns today after her last visit in December 2024.  She states her last 6 months have been very difficult as she was concerned about her father's health and was going back and forth to [employer].  As she has not been able to exercise on a regular basis.  She has been experiencing increased fatigue.  She is planning to join the gym.  She states the metatarsal pads were very helpful although she continues to have some discomfort in her hammertoe.  She is planning to have surgery in the future.  She continues to have some stiffness in her hands and the left trochanteric bursa.  She states she has been having increased lower back pain recently.  She also has tight hamstrings.  Patient states she stopped diltiazem .  She has not noticed any worsening of Raynauds.  She continues to have generalized pain and discomfort from fibromyalgia.    Activities of Daily Living:  Patient reports morning stiffness for situational minutes.   Patient Reports nocturnal pain.  Difficulty dressing/grooming: Denies Difficulty climbing stairs: Denies Difficulty getting out of chair: Reports Difficulty using hands for taps, buttons, cutlery, and/or writing: Reports  Review of Systems  Constitutional:  Positive for fatigue.  HENT:  Positive for mouth dryness. Negative for mouth sores.   Eyes:  Positive for dryness.  Respiratory:  Negative for shortness of breath.   Cardiovascular:  Positive for palpitations. Negative for chest pain.  Gastrointestinal:  Negative for blood in stool, constipation and  diarrhea.  Endocrine: Negative for increased urination.  Genitourinary:  Negative for involuntary urination.  Musculoskeletal:  Positive for joint pain, joint pain, myalgias, muscle weakness, morning stiffness, muscle tenderness and myalgias. Negative for gait problem and joint swelling.  Skin:  Positive for sensitivity to sunlight. Negative for color change, rash and hair loss.  Allergic/Immunologic: Negative for susceptible to infections.  Neurological:  Negative for dizziness and headaches.  Hematological:  Negative for swollen glands.  Psychiatric/Behavioral:  Positive for sleep disturbance. Negative for depressed mood. The patient is not nervous/anxious.     PMFS History:  Patient Active Problem List   Diagnosis Date Noted   Breast cancer, left breast (HCC) 02/11/2021   Cough variant asthma with ? component upper airway cough syndrome 12/23/2018   Primary osteoarthritis of both hands 02/21/2016   Fibromyalgia 02/21/2016   History of hypothyroidism 02/21/2016   Dyslipidemia 02/21/2016   History of breast cancer 02/21/2016   Age-related osteoporosis without current pathological fracture 02/21/2016   Hypothyroidism 08/06/2013   Other and unspecified hyperlipidemia 08/06/2013   Insomnia 08/06/2013   Intrinsic asthma 08/06/2013   Hip pain, left 01/12/2011   Plantar fasciitis 01/12/2011    Past Medical History:  Diagnosis Date   Arthritis    Asthma    Cancer (HCC) 1990   right breast ca-mast with reconstr   Cancer [facility]) 12/2020   left breast DCIS   Eczema    dx by derm per patient   Fibromyalgia  Hyperlipidemia    Hypothyroidism    Neuromuscular disorder (HCC)    Osteoporosis    PONV (postoperative nausea and vomiting)    PVC (premature ventricular contraction)    Thyroid  disease     Family History  Problem Relation Age of Onset   Cancer Mother    Hyperlipidemia Mother    Hypertension Mother    Stroke Mother    Dementia Mother    Hyperlipidemia Father     Stroke Father    Prostate cancer Father    Cancer Father 44       Prostate   Hypertension Brother    Past Surgical History:  Procedure Laterality Date   APPENDECTOMY     ARTHRODESIS METATARSALPHALANGEAL JOINT (MTPJ) Left 10/15/2022   Procedure: ARTHRODESIS METATARSALPHALANGEAL JOINT (MTPJ);  Surgeon: Amada Backer, MD;  Location: [facility];  Service: Orthopedics;  Laterality: Left;   BREAST IMPLANT REMOVAL Right 02/11/2021   Procedure: REMOVAL RIGHT BREAST IMPLANT;  Surgeon: Alger Infield, MD;  Location: [facility];  Service: Plastics;  Laterality: Right;   BREAST SURGERY Right 1990   Mastectomy   CAPSULECTOMY Right 02/11/2021   Procedure: CAPSULECTOMY;  Surgeon: Alger Infield, MD;  Location: [facility];  Service: Plastics;  Laterality: Right;   FOOT SURGERY     LAPROSCOPIC     TOTAL MASTECTOMY Left 02/11/2021   Procedure: LEFT TOTAL MASTECTOMY;  Surgeon: Enid Harry, MD;  Location:  SURGERY CENTER;  Service: General;  Laterality: Left;   WISDOM TEETH REMOVAL     Social History   Social History Narrative   Not on file   Immunization History  Administered Date(s) Administered   Influenza-Unspecified 10/04/2018   PFIZER(Purple Top)SARS-COV-2 Vaccination 01/31/2019, 02/21/2019, 11/04/2019, 05/18/2020   Pfizer(Comirnaty)Fall Seasonal Vaccine 12 years and older 05/26/2023     Objective: Vital Signs: BP 117/71 (BP Location: Left Arm, Patient Position: Sitting, Cuff Size: Normal)   Pulse 67   Resp 14   Ht 5' 7.25" (1.708 m)   Wt 169 lb (76.7 kg)   BMI 26.27 kg/m    Physical Exam Vitals and nursing note reviewed.  Constitutional:      Appearance: She is well-developed.  HENT:     Head: Normocephalic and atraumatic.  Eyes:     Conjunctiva/sclera: Conjunctivae normal.  Cardiovascular:     Rate and Rhythm: Normal rate and regular rhythm.     Heart sounds: Normal heart sounds.  Pulmonary:     Effort:  Pulmonary effort is normal.     Breath sounds: Normal breath sounds.  Abdominal:     General: Bowel sounds are normal.     Palpations: Abdomen is soft.  Musculoskeletal:     Cervical back: Normal range of motion.  Lymphadenopathy:     Cervical: No cervical adenopathy.  Skin:    General: Skin is warm and dry.     Capillary Refill: Capillary refill takes less than 2 seconds.  Neurological:     Mental Status: She is alert and oriented to person, place, and time.  Psychiatric:        Behavior: Behavior normal.      Musculoskeletal Exam: Cervical, thoracic and lumbar spine were in good range of motion.  She had no SI joint tenderness.  Shoulders, elbows, wrist joints, MCPs PIPs and DIPs Juengel range of motion with no synovitis.  Hip joints with good range of motion.  She had mild tenderness over left trochanteric bursa.  Knee joints, ankles were in good  range of motion.  There was no tenderness across MTPs.  CDAI Exam: CDAI Score: -- Patient Global: --; Provider Global: -- Swollen: --; Tender: -- Joint Exam 07/13/2023   No joint exam has been documented for this visit   There is currently no information documented on the homunculus. Go to the Rheumatology activity and complete the homunculus joint exam.  Investigation: No additional findings.  Imaging: No results found.  Recent Labs: Lab Results  Component Value Date   WBC 16.8 (H) 08/06/2013   HGB 13.4 08/06/2013   PLT 391 08/06/2013    Speciality Comments: No specialty comments available.  Procedures:  No procedures performed Allergies: Prochlorperazine, Compazine, and Tape   Assessment / Plan:     Visit Diagnoses: Primary osteoarthritis of both hands -she continues to have stiffness in her hands.  No synovitis was noted.  Bilateral DIP thickening was noted.  Trochanteric bursitis of left hip-she had tenderness over left trochanteric bursa.  She has been doing IT band stretches which have been helpful.  Primary  osteoarthritis of both feet - Left first MTP fusion September 2024 by Dr. Rosebud Confer.  She continues to have numbness in the lateral aspect of the left foot.  She plans to have surgery for hammertoes in the left foot.  She continue to have difficulty walking.  It limits her from doing exercises.  Benefits of water aerobics and swimming were discussed.  Plantar fasciitis-no recent episodes.  Spondylosis of lumbar spine-she has been spearing seeing increased lower back pain.  She denies any radiculopathy.  She has had epidural injections in the past by Dr. Rexanne Catalina.  Core strengthening exercise were discussed and a handout was placed in the AVS.  Osteopenia of multiple sites-DEXA scan January 12, 2022 showed T-score of -2.3, BMD 0.595 in the left femoral neck.  Right femoral neck showed 70% increase.  Resistance training and exercise was emphasized.  Raynaud's disease without gangrene-currently not symptomatic.  Other fatigue-she has been asked Raynauds increased fatigue.  She has been under a lot of stress.  Need for regular exercise was emphasized.  Primary insomnia-good sleep  Fibromyalgia-she continues to have generalized pain and discomfort from fibromyalgia.  She takes methocarbamol  on as needed basis.  She continues to take gabapentin  300 mg p.o. nightly.  Benefits of water aerobics and swimming were discussed.  History of breast cancer-Ductal carcinoma in situ.ER/PR+, Status post left mastectomy February 11, 2021.CTX 1990, No RTX.   History of hypothyroidism  History of hyperlipidemia  Orders: No orders of the defined types were placed in this encounter.  No orders of the defined types were placed in this encounter.    Follow-Up Instructions: Return in about 6 months (around 01/12/2024) for Osteoarthritis.   Nicholas Bari, MD  Note - This record has been created using Animal nutritionist.  Chart creation errors have been sought, but may not always  have been located. Such creation  errors do not reflect on  the standard of medical care.

## 2023-07-15 ENCOUNTER — Ambulatory Visit: Payer: Medicare Other | Admitting: Rheumatology

## 2023-07-15 ENCOUNTER — Ambulatory Visit: Payer: Self-pay | Attending: Obstetrics & Gynecology

## 2023-07-15 DIAGNOSIS — M25552 Pain in left hip: Secondary | ICD-10-CM | POA: Insufficient documentation

## 2023-07-15 DIAGNOSIS — R279 Unspecified lack of coordination: Secondary | ICD-10-CM | POA: Insufficient documentation

## 2023-07-15 DIAGNOSIS — M7062 Trochanteric bursitis, left hip: Secondary | ICD-10-CM

## 2023-07-15 DIAGNOSIS — F5101 Primary insomnia: Secondary | ICD-10-CM

## 2023-07-15 DIAGNOSIS — M47816 Spondylosis without myelopathy or radiculopathy, lumbar region: Secondary | ICD-10-CM

## 2023-07-15 DIAGNOSIS — M722 Plantar fascial fibromatosis: Secondary | ICD-10-CM

## 2023-07-15 DIAGNOSIS — M62838 Other muscle spasm: Secondary | ICD-10-CM | POA: Insufficient documentation

## 2023-07-15 DIAGNOSIS — R5383 Other fatigue: Secondary | ICD-10-CM

## 2023-07-15 DIAGNOSIS — M6281 Muscle weakness (generalized): Secondary | ICD-10-CM | POA: Diagnosis not present

## 2023-07-15 DIAGNOSIS — M797 Fibromyalgia: Secondary | ICD-10-CM

## 2023-07-15 DIAGNOSIS — M5459 Other low back pain: Secondary | ICD-10-CM | POA: Diagnosis not present

## 2023-07-15 DIAGNOSIS — I7300 Raynaud's syndrome without gangrene: Secondary | ICD-10-CM

## 2023-07-15 DIAGNOSIS — Z8639 Personal history of other endocrine, nutritional and metabolic disease: Secondary | ICD-10-CM

## 2023-07-15 DIAGNOSIS — Z853 Personal history of malignant neoplasm of breast: Secondary | ICD-10-CM

## 2023-07-15 DIAGNOSIS — R102 Pelvic and perineal pain: Secondary | ICD-10-CM | POA: Diagnosis not present

## 2023-07-15 DIAGNOSIS — M8589 Other specified disorders of bone density and structure, multiple sites: Secondary | ICD-10-CM

## 2023-07-15 DIAGNOSIS — M19041 Primary osteoarthritis, right hand: Secondary | ICD-10-CM

## 2023-07-15 DIAGNOSIS — M19071 Primary osteoarthritis, right ankle and foot: Secondary | ICD-10-CM

## 2023-07-15 NOTE — Therapy (Signed)
 OUTPATIENT PHYSICAL THERAPY FEMALE PELVIC TREATMENT   Patient Name: Michelle Gray MRN: [id number] DOB:1955/11/25, 68 y.o., female Today's Date: 07/15/2023  END OF SESSION:  PT End of Session - 07/15/23 0801     Visit Number 28    Date for PT Re-Evaluation 10/07/23    Authorization Type [employer] - Medicare    Progress Note Due on Visit 30    PT Start Time 0800    PT Stop Time 0840    PT Time Calculation (min) 40 min    Activity Tolerance Patient tolerated treatment well    Behavior During Therapy [facility] for tasks assessed/performed            Past Medical History:  Diagnosis Date   Arthritis    Asthma    Cancer (HCC) 1990   right breast ca-mast with reconstr   Cancer [facility]) 12/2020   left breast DCIS   Eczema    dx by derm per patient   Fibromyalgia    Hyperlipidemia    Hypothyroidism    Neuromuscular disorder (HCC)    Osteoporosis    PONV (postoperative nausea and vomiting)    PVC (premature ventricular contraction)    Thyroid  disease    Past Surgical History:  Procedure Laterality Date   APPENDECTOMY     ARTHRODESIS METATARSALPHALANGEAL JOINT (MTPJ) Left 10/15/2022   Procedure: ARTHRODESIS METATARSALPHALANGEAL JOINT (MTPJ);  Surgeon: Amada Backer, MD;  Location: [facility];  Service: Orthopedics;  Laterality: Left;   BREAST IMPLANT REMOVAL Right 02/11/2021   Procedure: REMOVAL RIGHT BREAST IMPLANT;  Surgeon: Alger Infield, MD;  Location: [facility];  Service: Plastics;  Laterality: Right;   BREAST SURGERY Right 1990   Mastectomy   CAPSULECTOMY Right 02/11/2021   Procedure: CAPSULECTOMY;  Surgeon: Alger Infield, MD;  Location: [facility];  Service: Plastics;  Laterality: Right;   FOOT SURGERY     LAPROSCOPIC     TOTAL MASTECTOMY Left 02/11/2021   Procedure: LEFT TOTAL MASTECTOMY;  Surgeon: Enid Harry, MD;  Location: [facility];  Service: General;  Laterality: Left;   WISDOM TEETH REMOVAL      Patient Active Problem List   Diagnosis Date Noted   Breast cancer, left breast (HCC) 02/11/2021   Cough variant asthma with ? component upper airway cough syndrome 12/23/2018   Primary osteoarthritis of both hands 02/21/2016   Fibromyalgia 02/21/2016   History of hypothyroidism 02/21/2016   Dyslipidemia 02/21/2016   History of breast cancer 02/21/2016   Age-related osteoporosis without current pathological fracture 02/21/2016   Hypothyroidism 08/06/2013   Other and unspecified hyperlipidemia 08/06/2013   Insomnia 08/06/2013   Intrinsic asthma 08/06/2013   Hip pain, left 01/12/2011   Plantar fasciitis 01/12/2011    PCP: Helyn Lobstein, MD  REFERRING PROVIDER: Terri Fester, MD  REFERRING DIAG: R29.898 (ICD-10-CM) - Other symptoms and signs involving the musculoskeletal system  THERAPY DIAG:  Pain in left hip  Unspecified lack of coordination  Other muscle spasm  Pelvic pain  Other low back pain  Muscle weakness (generalized)  Rationale for Evaluation and Treatment: Rehabilitation  ONSET DATE: 6 months   SUBJECTIVE:  SUBJECTIVE STATEMENT: Pt states that she did a lot of work in the yard yesterday and her pain was very high last night. She reports pain is all over and got up to a 8/10 last night. She has not had to use the wand because her pain has been better. Up until large increase in yard work, she did feel like things were better.   PAIN: 07/15/23 Are you having pain? Yes NPRS scale: 3/10 Pain location: pelvic pain, Lt side, wraps around into groin and Lt side of sacrum   Pain type: aching and burning Pain description: intermittent   Aggravating factors: sitting, later in the day Relieving factors: stretches (pelvic tilts, hamstring stretches), ice  PRECAUTIONS:  None  RED FLAGS: None   WEIGHT BEARING RESTRICTIONS: No  FALLS:  Has patient fallen in last 6 months? No  LIVING ENVIRONMENT: Lives with: lives with their family Lives in: House/apartment   OCCUPATION: retired  PLOF: Independent  PATIENT GOALS: decrease Lt hip/pelvic pain  PERTINENT HISTORY:  Appendectomy, double mastectomy/breast cancer  BOWEL MOVEMENT: Pain with bowel movement: No Type of bowel movement:Frequency 1x/day and Strain No Fully empty rectum: Yes: - Leakage: No Pads: No Fiber supplement: No  URINATION: Pain with urination: No Fully empty bladder: Yes: - Stream: Strong Urgency: Yes: occasional Frequency: unsure daily, 1-2x/night Leakage: none - she is now having some stress incontinence since foot surgery.  Pads: No  INTERCOURSE: Pain with intercourse: No pain Ability to have vaginal penetration:  Yes: - Climax: WNL Marinoff Scale: 0/3  PREGNANCY: None  PROLAPSE: none   OBJECTIVE:  07/15/23: LUMBARAROM/PROM:  A/PROM A/PROM  07/15/23 (% available)  Flexion 75  Extension 100  Right lateral flexion 80  Left lateral flexion 80  Right rotation 75  Left rotation 75   (Blank rows = not tested)  Single leg stance: -Rt: initially pelvic drop; she could feel and was able to correct to some extent, but rotated to the Lt in order to hold more stable  -Lt: Overall stable with light UE support  Palpation:   06/01/23 Decreased lumbar lordosis  Significant bil hip flexor restriction  04/12/23 Improving Lt adductor restriction, obturator restriction Still tender at Lt ischial tuberosity and inferior pubic ramus  Notable restriction in Rt obliques/lats/lumbar paraspinals  02/24/23 Significant restriction in Lt obturator internus with tenderness Adductor restriction and fascial restriction over inside of ischial tuberosity and obturator foramen  Trigger points throughout Lt glutes and tender sacral attachments.   12/22/22                              Internal Pelvic Floor burning around superficial pelvic floor layers; pain/tightness in Lt levator ani  Patient confirms identification and approves PT to assess internal pelvic floor and treatment Yes  PELVIC MMT:   MMT eval  Vaginal 4/5, 8 second endurance, 6 repeat contractions with decreasing coordination  (Blank rows = not tested)        TONE: Increased tone in Lt deep pelvic floor  PROLAPSE: WNL  09/28/22: COGNITION: Overall cognitive status: Within functional limits for tasks assessed     SENSATION: Light touch: Appears intact Proprioception: Appears intact  FUNCTIONAL TESTS:  Squat:Rt valgus knee collapse, anterior weight shift Single leg stance: Lt, pelvic collapse on Rt, 5 seconds; Rt stable pelvis >10 seconds  GAIT: Comments: WNL  POSTURE: rounded shoulders, forward head, decreased lumbar lordosis, decreased thoracic kyphosis, anterior pelvic tilt, and Lt lower thoracolumbar scoliosis  LUMBARAROM/PROM:  A/PROM A/PROM  Eval (% available)  Flexion 75  Extension 100  Right lateral flexion 50  Left lateral flexion 50  Right rotation 50  Left rotation 50   (Blank rows = not tested)  PALPATION:   General  significant Lt chest wall scar tissue that is limiting rib cage mobility; decreased bil lateral rib cage excursion, generalized abdominal tightness likely due to bracing form low back pain and fear of movement                External Perineal Exam WNL                             Internal Pelvic Floor some burning at posterior fourchette; some mild tenderness and increased tension in Lt levator ani; mild atrophy in Rt levator ani  Patient confirms identification and approves PT to assess internal pelvic floor and treatment Yes  PELVIC MMT:   MMT eval  Vaginal 3/5, 2 second endurance, 6 repeat contractions with decreasing coordination  Internal Anal Sphincter   External Anal Sphincter   Puborectalis   Diastasis Recti WNL  (Blank rows = not  tested)        TONE: Slight increase in Rt levator ani  PROLAPSE: WNL  TODAY'S TREATMENT:                                                                                                                              DATE:  07/15/23 RE-EVAL Manual: Reassessment of lumbar A/ROM and pelvic stability in single leg stance Soft tissue mobilization to Lt adductors, bil lumbar paraspinals, bil quadratus lumborum, and bil glutes Myofascial/trigger point release to Lt obturator internus   06/08/23 Manual: External obturator internus trigger point release and soft tissue mobilization in Lt side lying Neuromuscular re-education: Staggered pallof press red band 2 x 10 bil Standing shoulder extension red band 3 x 10 Standing 3 way kick 10x each, bil  Standing lateral lunge with cues for form and better mobility 12x bil   06/01/23 Manual: Prone PA mobilizations grade 1-3 L1-S1 Neuromuscular re-education: Seated hip adduction ball press with transversus abdominus and pelvic floor muscle 2 x 10 Seated hip abduction red band with transversus abdominus and pelvic floor muscle 2 x 10 Seated resisted march red band with transversus abdominus and pelvic floor muscle 2 x 10 Exercises: Prone hip flexor stretching with overpressure 3 minutes bil Seated backward reaches in sitting 10x bil Open books 10x bil Unilateral chest press in side lying rotation 10x bil 5lbs     PATIENT EDUCATION:  Education details: See above Person educated: Patient Education method: Programmer, multimedia, Demonstration, Actor cues, Verbal cues, and Handouts Education comprehension: verbalized understanding  HOME EXERCISE PROGRAM: Written handout  ASSESSMENT:  CLINICAL IMPRESSION: Pt doing very well overall even though she is having more pain today from increased yard work this week. She was managing with exercises and not  even having to perform pelvic floor muscle wand on regular basis before this week. She is becoming  more stable in pelvis in single leg stance, but when standing on Rt she has to rotate left to help engage hip abductors in order to achieve more stability - this and the continued pelvic drop on the side may be what is keeping Lt obturator and glutes aggravated. She demonstrated notable tightness throughout Lt side in low back, hip and adductors, including obturator internus. She responded very well and demonstrated good release at end of session. She is doing well with having about one visit a month and feels like this is appropriate to maintain her progress overall. She will continue to benefit from skilled PT intervention in order to improve pain, decrease abdominal and chest scar tissue restriction, and begin/progress functional strengthening program.   OBJECTIVE IMPAIRMENTS: decreased activity tolerance, decreased coordination, decreased endurance, decreased strength, increased fascial restrictions, increased muscle spasms, impaired tone, postural dysfunction, and pain.   ACTIVITY LIMITATIONS: bending, sitting, squatting, and locomotion level  PARTICIPATION LIMITATIONS: community activity  PERSONAL FACTORS: 1 comorbidity: medical history  are also affecting patient's functional outcome.   REHAB POTENTIAL: Good  CLINICAL DECISION MAKING: Stable/uncomplicated  EVALUATION COMPLEXITY: Low   GOALS: Goals reviewed with patient? Yes  SHORT TERM GOALS: Updated 07/15/23  Pt will be independent with HEP.   Baseline: Goal status: MET 02/02/23  2.  Pt will be independent with diaphragmatic breathing and down training activities in order to improve pelvic floor/abdominal relaxation.  Baseline:  Goal status: MET 02/02/23  3.  Pt will be independent with chest wall scar tissue mobilization techniques and perform at least 2x/week.  Baseline:  Goal status: IN PROGRESS 05/18/23   LONG TERM GOALS: updated 07/15/23  Pt will be independent with advanced HEP.   Baseline:  Goal status: IN  PROGRESS 07/15/23  2.  Pt will demonstrate normal pelvic floor muscle tone and A/ROM, able to achieve 4/5 strength with contractions and 10 sec endurance, in order to provide appropriate lumbopelvic support in functional activities.   Baseline: 4/5 strength, still working on endurance Goal status: IN PROGRESS 07/15/23  3.  Pt will report no Lt pelvic pain higher than 3/10.  Baseline: patient is having higher pain levels over the last several weeks exceeding 3/10 Goal status: MET 04/12/23  4.  Pt will be able to sit for longer than 60 minutes without increase in pelvic pain. Baseline: driving long distances still aggravating and any prolonged sitting Goal status: IN PROGRESS 07/15/23  5.  Pt will be able to perform single leg stance >20 seconds bil without pelvic drop.  Baseline:  Goal status: IN PROGRESS 07/15/23  6.  Pt will increase all impaired lumbar A/ROM by 25% without pain.  Baseline:  Goal status: IN PROGRESS 07/15/23  PLAN:  PT FREQUENCY: 1-2x/week  PT DURATION: 12 weeks  PLANNED INTERVENTIONS: Therapeutic exercises, Therapeutic activity, Neuromuscular re-education, Balance training, Gait training, Patient/Family education, Self Care, Joint mobilization, Dry Needling, Biofeedback, and Manual therapy  PLAN FOR NEXT SESSION: Continue core training; mobility exercises for pelvic floor; possible dry needling/myofascial release to Lt pelvic girdle    Verlena Glenn, PT, DPT06/12/258:38 AM

## 2023-07-26 ENCOUNTER — Other Ambulatory Visit: Payer: Self-pay

## 2023-07-26 MED ORDER — ZOLPIDEM TARTRATE 10 MG PO TABS: 10.0000 mg | ORAL_TABLET | Freq: Every day | ORAL | 0 refills | Status: DC

## 2023-07-26 NOTE — Telephone Encounter (Signed)
 Last Fill: 06/25/2023  Next Visit: 01/17/2024  Last Visit: 07/13/2023  Dx: Primary insomnia    Current Dose per office note on 01/14/2023:   Okay to refill Ambien ?

## 2023-07-27 NOTE — Progress Notes (Unsigned)
 Cardiology Office Note:    Date:  07/28/2023   ID:  Michelle Gray, DOB [birth year], MRN [id number]  PCP:  Aisha Harvey, MD  Cardiologist:  Lonni LITTIE Nanas, MD  Electrophysiologist:  None   Referring MD: Aisha Harvey, MD   Chief Complaint  Patient presents with   Palpitations    History of Present Illness:    Michelle Gray is a 68 y.o. female with a hx of fibromyalgia, asthma, hyperlipidemia, breast cancer, hypothyroidism who returns for follow-up.  She was initially seen on 12/08/2018, she was referred by Dr. Aisha for evaluation of palpitations and chest pain.    TTE was done on 12/20/2018, which showed normal LV systolic function, normal RV function, no significant valvular disease.  Cardiac monitor showed no significant arrhythmias, with the patient triggered events corresponding to sinus rhythm plus or minus PACs.  Coronary CTA was done on 01/14/2019, which showed nonobstructive CAD with calcified plaque in the proximal LAD causing minimal (0-24%) stenosis and noncalcified plaque in the proximal RCA causing minimal (0-24%) stenosis.  Calcium  score was 32 (73rd percentile for age/gender).  Zio patch x 7 days 01/2022 showed 2 episodes of NSVT, longest lasting 5 beats and 45 episodes of SVT with longest lasting 18.5 seconds and occasional PVCs (1.7% of beats).  Echocardiogram 02/20/2022 showed EF 55 to 60%, normal RV function, no significant valvular disease.  Since last clinic visit, she reports she is doing okay.  She continues to have palpitations.  Her Kardia mobile device is showing PVCs.  She is currently taking diltiazem  180 mg daily.  She reports some lightheadedness but denies any syncope.  Denies any chest pain, dyspnea, lower extremity edema. Not exercising.     Past Medical History:  Diagnosis Date   Arthritis    Asthma    Cancer (HCC) 1990   right breast ca-mast with reconstr   Cancer [facility]) 12/2020   left breast DCIS   Eczema    dx by derm per patient    Fibromyalgia    Hyperlipidemia    Hypothyroidism    Neuromuscular disorder (HCC)    Osteoporosis    PONV (postoperative nausea and vomiting)    PVC (premature ventricular contraction)    Thyroid  disease     Past Surgical History:  Procedure Laterality Date   APPENDECTOMY     ARTHRODESIS METATARSALPHALANGEAL JOINT (MTPJ) Left 10/15/2022   Procedure: ARTHRODESIS METATARSALPHALANGEAL JOINT (MTPJ);  Surgeon: Kit Rush, MD;  Location: [facility];  Service: Orthopedics;  Laterality: Left;   BREAST IMPLANT REMOVAL Right 02/11/2021   Procedure: REMOVAL RIGHT BREAST IMPLANT;  Surgeon: Arelia Filippo, MD;  Location: [facility];  Service: Plastics;  Laterality: Right;   BREAST SURGERY Right 1990   Mastectomy   CAPSULECTOMY Right 02/11/2021   Procedure: CAPSULECTOMY;  Surgeon: Arelia Filippo, MD;  Location: [facility];  Service: Plastics;  Laterality: Right;   FOOT SURGERY     LAPROSCOPIC     TOTAL MASTECTOMY Left 02/11/2021   Procedure: LEFT TOTAL MASTECTOMY;  Surgeon: Ebbie Cough, MD;  Location: [facility];  Service: General;  Laterality: Left;   WISDOM TEETH REMOVAL      Current Medications: Current Meds  Medication Sig   5-Hydroxytryptophan 100 MG CAPS 2 capsules every day by oral route.   Acetaminophen  (TYLENOL  PO) Take 1,000 mg by mouth 2 (two) times daily.   albuterol  (VENTOLIN  HFA) 108 (90 Base) MCG/ACT inhaler Inhale 2 puffs into the lungs every 6 (  six) hours as needed for wheezing or shortness of breath.   Biotin 5000 MCG CAPS Take 5,000 mcg by mouth daily.   budesonide -formoterol  (SYMBICORT ) 160-4.5 MCG/ACT inhaler Inhale 2 puffs into the lungs in the morning and at bedtime.   Calcium  Carbonate (CALCIUM  500 PO) Take 500 mg by mouth daily at 6 (six) AM.   Cholecalciferol (VITAMIN D3) 5000 units CAPS Take 7,000 Units by mouth once a week.   Coenzyme Q10 (COQ-10) 100 MG CAPS    diltiazem  (CARDIZEM  CD) 180 MG  24 hr capsule 1 capsule.   estradiol (ESTRACE) 0.1 MG/GM vaginal cream Insert 1 g twice a week by vaginal route as directed for 30 days.   ezetimibe  (ZETIA ) 10 MG tablet TAKE 1 TABLET BY MOUTH DAILY   gabapentin  (NEURONTIN ) 300 MG capsule Take 1 capsule (300 mg total) by mouth at bedtime.   Glucosamine 500 MG TABS Take 1,500 mg by mouth daily.   levothyroxine  (SYNTHROID , LEVOTHROID) 50 MCG tablet 50 mcg 6 days weekly.   levothyroxine  (SYNTHROID , LEVOTHROID) 75 MCG tablet Take 75 mcg by mouth once a week.   Magnesium 500 MG CAPS Take 500 mg by mouth daily.   meloxicam  (MOBIC ) 15 MG tablet Take 1 tablet (15 mg total) by mouth daily.   methocarbamol  (ROBAXIN ) 750 MG tablet Take 1 tablet (750 mg total) by mouth at bedtime as needed for muscle spasms.   Misc Natural Products (TART CHERRY ADVANCED PO) Take by mouth.   mometasone  (ELOCON ) 0.1 % cream Apply 1 Application topically daily as needed (As needed).   rosuvastatin  (CRESTOR ) 20 MG tablet TAKE 1 TABLET BY MOUTH DAILY   zolpidem  (AMBIEN ) 10 MG tablet Take 1 tablet (10 mg total) by mouth at bedtime.     Allergies:   Prochlorperazine, Compazine, and Tape   Social History   Socioeconomic History   Marital status: Married    Spouse name: Not on file   Number of children: Not on file   Years of education: Not on file   Highest education level: Not on file  Occupational History   Not on file  Tobacco Use   Smoking status: Never    Passive exposure: Past   Smokeless tobacco: Never  Vaping Use   Vaping status: Never Used  Substance and Sexual Activity   Alcohol use: Yes    Comment: social   Drug use: Never   Sexual activity: Not on file  Other Topics Concern   Not on file  Social History Narrative   Not on file   Social Drivers of Health   Financial Resource Strain: Not on file  Food Insecurity: Low Risk  (09/06/2022)   Received from [facility]   Hunger Vital Sign    Within the past 12 months, you worried that your food  would run out before you got money to buy more: Never true    Within the past 12 months, the food you bought just didn't last and you didn't have money to get more. : Never true  Transportation Needs: Not on file (09/06/2022)  Physical Activity: Not on file  Stress: Not on file  Social Connections: Not on file     Family History: The patient's family history includes Cancer in her mother; Cancer (age of onset: 20) in her father; Dementia in her mother; Hyperlipidemia in her father and mother; Hypertension in her brother and mother; Prostate cancer in her father; Stroke in her father and mother.  ROS:   Please see the history  of present illness.    All other systems reviewed and are negative.  EKGs/Labs/Other Studies Reviewed:    The following studies were reviewed today:   EKG:   04/09/21: NSR with first degree AV block, LAD, Q waves in V1-3 (old) 04/06/22: NSR, rate 69, first degree AV block, iRBBB, Q waves in V1-3 (old), LAD 07/28/2023: Normal sinus rhythm with first-degree AV, rate 70, no ST abnormalities, incomplete right bundle branch block   Recent Labs: 05/20/2023: TSH 1.10  Recent Lipid Panel No results found for: CHOL, TRIG, HDL, CHOLHDL, VLDL, LDLCALC, LDLDIRECT   TTE 12/20/18:  1. Left ventricular ejection fraction, by visual estimation, is 60 to  65%. The left ventricle has normal function. There is no left ventricular  hypertrophy. Normal diastolic function.   2. Global right ventricle has normal systolic function.The right  ventricular size is normal. No increase in right ventricular wall  thickness.   3. Left atrial size was normal.   4. Right atrial size was normal.   5. Moderate thickening of the mitral valve leaflet(s).   6. The mitral valve is normal in structure. Trace mitral valve  regurgitation.   7. The tricuspid valve is normal in structure. Tricuspid valve  regurgitation is trivial.   8. The aortic valve was not well visualized. Aortic valve  regurgitation  is not visualized. No evidence of aortic valve sclerosis or stenosis.   9. The pulmonic valve was not well visualized. Pulmonic valve  regurgitation is not visualized.  10. Mildly elevated pulmonary artery systolic pressure.  11. The inferior vena cava is normal in size with greater than 50%  respiratory variability, suggesting right atrial pressure of 3 mmHg.  12. The tricuspid regurgitant velocity is 2.31 m/s, and with an assumed  right atrial pressure of 10 mmHg, the estimated right ventricular systolic  pressure is mildly elevated at 31.3 mmHg.   CTA 01/14/19: 1. Coronary calcium  score of 32. This was 3 percentile for age and sex matched control. 2.  Normal coronary origin with right dominance. 3. Nonobstructive CAD, with calcified plaque in the proximal LAD causing minimal (0-24%) stenosis and noncalcified plaque in the proximal RCA causing minimal (0-24%) stenosis   CAD-RADS 1. Minimal non-obstructive CAD (0-24%). Consider non-atherosclerotic causes of chest pain. Consider preventive therapy and risk factor modification.  Noncardiac: IMPRESSION: 1.  Aortic Atherosclerosis (ICD10-I70.0).  Cardiac monitor 02/16/19: No significant arrhythmias Patient triggered events corresponded to sinus rhythm +/- PACs   Predominant rhythm is sinus rhythm. Range is 58-142 bpm with average of 78 bpm. No atrial fibrillation, sustained ventricular tachycardia, significant pause, or high degree AV block. Total ectopy <1%. 20 patient triggered events, corresponding to sinus rhythm  +/- PACs  No significant abnormalities.  Physical Exam:    VS:  BP 122/64   Pulse 74   Ht 5' 7.25 (1.708 m)   Wt 167 lb 9.6 oz (76 kg)   SpO2 97%   BMI 26.06 kg/m     Wt Readings from Last 3 Encounters:  07/28/23 167 lb 9.6 oz (76 kg)  07/13/23 169 lb (76.7 kg)  02/01/23 169 lb (76.7 kg)     GEN:  Well nourished, well developed in no acute distress HEENT: Normal NECK: No JVD; No carotid  bruits CARDIAC: RRR, no murmurs, rubs, gallops RESPIRATORY:  Expiratory wheezing ABDOMEN: Soft, non-tender, non-distended MUSCULOSKELETAL:  No edema; No deformity  SKIN: Warm and dry NEUROLOGIC:  Alert and oriented x 3 PSYCHIATRIC:  Normal affect   ASSESSMENT:  1. Palpitations   2. PVC's (premature ventricular contractions)   3. Coronary artery disease involving native coronary artery of native heart without angina pectoris   4. Hyperlipidemia, unspecified hyperlipidemia type       PLAN:    Coronary artery disease: Coronary CTA on 01/14/2019  showed nonobstructive CAD with calcified plaque in the proximal LAD causing minimal (0-24%) stenosis and noncalcified plaque in the proximal RCA causing minimal (0-24%) stenosis.  Calcium  score was 32 (73rd percentile for age/gender) -Rosuvastatin  increased to 40 mg daily, developed myalgias, so was decreased to 20 mg daily and Zetia  10 mg daily was added.  LDL 72 on 05/2023. Continue rosuvastatin  and Zetia .  Palpitations: Zio patch x 7 days 01/2022 showed 2 episodes of NSVT, longest lasting 5 beats and 45 episodes of SVT with longest lasting 18.5 seconds and occasional PVCs (1.7% of beats).  Echocardiogram 02/20/2022 showed EF 55 to 60%, normal RV function, no significant valvular disease.  Normal electrolytes and TSH on labs 03/2022. -She had been on Toprol  for her palpitations, with description concerning for symptomatic PVCs.  Symptoms not improving with metoprolol , was switched to diltiazem .  Currently on diltiazem  180 mg daily and stopped caffeine. - She had reported that her palpitations have improved but now having worsening palpitations.  Will check Zio patch x 3 days  Hyperlipidemia: Continue rosuvastatin  20 mg daily and Zetia  10 mg daily,  LDL 72 on 05/2023  RTC in 6 months  Medication Adjustments/Labs and Tests Ordered: Current medicines are reviewed at length with the patient today.  Concerns regarding medicines are outlined above.   Orders Placed This Encounter  Procedures   LONG TERM MONITOR (3-14 DAYS)   EKG 12-Lead   No orders of the defined types were placed in this encounter.   Patient Instructions  Medication Instructions:  Continue current medicaitons *If you need a refill on your cardiac medications before your next appointment, please call your pharmacy*  Lab Work: none If you have labs (blood work) drawn today and your tests are completely normal, you will receive your results only by: MyChart Message (if you have MyChart) OR A paper copy in the mail If you have any lab test that is abnormal or we need to change your treatment, we will call you to review the results.  Testing/Procedures: Zio  ZIO XT- Long Term Monitor Instructions  Your physician has requested you wear a ZIO patch monitor for 3 days.  This is a single patch monitor. [employer] supplies one patch monitor per enrollment. Additional stickers are not available. Please do not apply patch if you will be having a Nuclear Stress Test,  Echocardiogram, Cardiac CT, MRI, or Chest Xray during the period you would be wearing the  monitor. The patch cannot be worn during these tests. You cannot remove and re-apply the  ZIO XT patch monitor.  Your ZIO patch monitor will be mailed 3 day USPS to your address on file. It may take 3-5 days  to receive your monitor after you have been enrolled.  Once you have received your monitor, please review the enclosed instructions. Your monitor  has already been registered assigning a specific monitor serial # to you.  Billing and Patient Assistance Program Information  We have supplied [employer] with any of your insurance information on file for billing purposes. [employer] offers a sliding scale Patient Assistance Program for patients that do not have  insurance, or whose insurance does not completely cover the cost of the ZIO monitor.  You  must apply for the Patient Assistance Program to qualify for this  discounted rate.  To apply, please call [employer] at [phone], select option 4, select option 2, ask to apply for  Patient Assistance Program. Meredeth will ask your household income, and how many people  are in your household. They will quote your out-of-pocket cost based on that information.  [employer] will also be able to set up a 66-month, interest-free payment plan if needed.  Applying the monitor   Shave hair from upper left chest.  Hold abrader disc by orange tab. Rub abrader in 40 strokes over the upper left chest as  indicated in your monitor instructions.  Clean area with 4 enclosed alcohol pads. Let dry.  Apply patch as indicated in monitor instructions. Patch will be placed under collarbone on left  side of chest with arrow pointing upward.  Rub patch adhesive wings for 2 minutes. Remove white label marked 1. Remove the white  label marked 2. Rub patch adhesive wings for 2 additional minutes.  While looking in a mirror, press and release button in center of patch. A small green light will  flash 3-4 times. This will be your only indicator that the monitor has been turned on.  Do not shower for the first 24 hours. You may shower after the first 24 hours.  Press the button if you feel a symptom. You will hear a small click. Record Date, Time and  Symptom in the Patient Logbook.  When you are ready to remove the patch, follow instructions on the last 2 pages of Patient  Logbook. Stick patch monitor onto the last page of Patient Logbook.  Place Patient Logbook in the blue and white box. Use locking tab on box and tape box closed  securely. The blue and white box has prepaid postage on it. Please place it in the mailbox as  soon as possible. Your physician should have your test results approximately 7 days after the  monitor has been mailed back to [facility].  Call [facility] Customer Care at [phone] if you have questions regarding  your ZIO XT patch monitor. Call  them immediately if you see an orange light blinking on your  monitor.  If your monitor falls off in less than 4 days, contact our Monitor department at [phone].  If your monitor becomes loose or falls off after 4 days call [employer] at [phone] for  suggestions on securing your monitor   Follow-Up: At [facility], you and your health needs are our priority.  As part of our continuing mission to provide you with exceptional heart care, our providers are all part of one team.  This team includes your primary Cardiologist (physician) and Advanced Practice Providers or APPs (Physician Assistants and Nurse Practitioners) who all work together to provide you with the care you need, when you need it.  Your next appointment:   6 month(s)  Provider:   Lonni LITTIE Nanas, MD    We recommend signing up for the patient portal called MyChart.  Sign up information is provided on this After Visit Summary.  MyChart is used to connect with patients for Virtual Visits (Telemedicine).  Patients are able to view lab/test results, encounter notes, upcoming appointments, etc.  Non-urgent messages can be sent to your provider as well.   To learn more about what you can do with MyChart, go to [email or web address].   Other Instructions none       Signed, Lonni LITTIE Nanas, MD  07/28/2023  11:29 AM    [facility]

## 2023-07-28 ENCOUNTER — Ambulatory Visit: Attending: Cardiology

## 2023-07-28 ENCOUNTER — Encounter: Payer: Self-pay | Admitting: Cardiology

## 2023-07-28 ENCOUNTER — Ambulatory Visit: Payer: Medicare Other | Attending: Cardiology | Admitting: Cardiology

## 2023-07-28 VITALS — BP 122/64 | HR 74 | Ht 67.25 in | Wt 167.6 lb

## 2023-07-28 DIAGNOSIS — E785 Hyperlipidemia, unspecified: Secondary | ICD-10-CM | POA: Insufficient documentation

## 2023-07-28 DIAGNOSIS — R002 Palpitations: Secondary | ICD-10-CM | POA: Diagnosis not present

## 2023-07-28 DIAGNOSIS — I493 Ventricular premature depolarization: Secondary | ICD-10-CM

## 2023-07-28 DIAGNOSIS — I2510 Atherosclerotic heart disease of native coronary artery without angina pectoris: Secondary | ICD-10-CM | POA: Insufficient documentation

## 2023-07-28 NOTE — Progress Notes (Unsigned)
 Enrolled patient for a 3 day Zio XT monitor to be mailed to patients home

## 2023-07-28 NOTE — Patient Instructions (Signed)
 Medication Instructions:  Continue current medicaitons *If you need a refill on your cardiac medications before your next appointment, please call your pharmacy*  Lab Work: none If you have labs (blood work) drawn today and your tests are completely normal, you will receive your results only by: MyChart Message (if you have MyChart) OR A paper copy in the mail If you have any lab test that is abnormal or we need to change your treatment, we will call you to review the results.  Testing/Procedures: Zio  ZIO XT- Long Term Monitor Instructions  Your physician has requested you wear a ZIO patch monitor for 3 days.  This is a single patch monitor. [employer] supplies one patch monitor per enrollment. Additional stickers are not available. Please do not apply patch if you will be having a Nuclear Stress Test,  Echocardiogram, Cardiac CT, MRI, or Chest Xray during the period you would be wearing the  monitor. The patch cannot be worn during these tests. You cannot remove and re-apply the  ZIO XT patch monitor.  Your ZIO patch monitor will be mailed 3 day USPS to your address on file. It may take 3-5 days  to receive your monitor after you have been enrolled.  Once you have received your monitor, please review the enclosed instructions. Your monitor  has already been registered assigning a specific monitor serial # to you.  Billing and Patient Assistance Program Information  We have supplied [employer] with any of your insurance information on file for billing purposes. [employer] offers a sliding scale Patient Assistance Program for patients that do not have  insurance, or whose insurance does not completely cover the cost of the ZIO monitor.  You must apply for the Patient Assistance Program to qualify for this discounted rate.  To apply, please call [employer] at [phone], select option 4, select option 2, ask to apply for  Patient Assistance Program. Meredeth will ask your household income, and  how many people  are in your household. They will quote your out-of-pocket cost based on that information.  [employer] will also be able to set up a 61-month, interest-free payment plan if needed.  Applying the monitor   Shave hair from upper left chest.  Hold abrader disc by orange tab. Rub abrader in 40 strokes over the upper left chest as  indicated in your monitor instructions.  Clean area with 4 enclosed alcohol pads. Let dry.  Apply patch as indicated in monitor instructions. Patch will be placed under collarbone on left  side of chest with arrow pointing upward.  Rub patch adhesive wings for 2 minutes. Remove white label marked 1. Remove the white  label marked 2. Rub patch adhesive wings for 2 additional minutes.  While looking in a mirror, press and release button in center of patch. A small green light will  flash 3-4 times. This will be your only indicator that the monitor has been turned on.  Do not shower for the first 24 hours. You may shower after the first 24 hours.  Press the button if you feel a symptom. You will hear a small click. Record Date, Time and  Symptom in the Patient Logbook.  When you are ready to remove the patch, follow instructions on the last 2 pages of Patient  Logbook. Stick patch monitor onto the last page of Patient Logbook.  Place Patient Logbook in the blue and white box. Use locking tab on box and tape box closed  securely. The blue and white box  has prepaid postage on it. Please place it in the mailbox as  soon as possible. Your physician should have your test results approximately 7 days after the  monitor has been mailed back to [facility].  Call [facility] Customer Care at [phone] if you have questions regarding  your ZIO XT patch monitor. Call them immediately if you see an orange light blinking on your  monitor.  If your monitor falls off in less than 4 days, contact our Monitor department at [phone].  If your monitor  becomes loose or falls off after 4 days call [employer] at [phone] for  suggestions on securing your monitor   Follow-Up: At [facility], you and your health needs are our priority.  As part of our continuing mission to provide you with exceptional heart care, our providers are all part of one team.  This team includes your primary Cardiologist (physician) and Advanced Practice Providers or APPs (Physician Assistants and Nurse Practitioners) who all work together to provide you with the care you need, when you need it.  Your next appointment:   6 month(s)  Provider:   Lonni LITTIE Nanas, MD    We recommend signing up for the patient portal called MyChart.  Sign up information is provided on this After Visit Summary.  MyChart is used to connect with patients for Virtual Visits (Telemedicine).  Patients are able to view lab/test results, encounter notes, upcoming appointments, etc.  Non-urgent messages can be sent to your provider as well.   To learn more about what you can do with MyChart, go to [email or web address].   Other Instructions none

## 2023-08-05 ENCOUNTER — Ambulatory Visit

## 2023-08-17 ENCOUNTER — Ambulatory Visit: Attending: Obstetrics & Gynecology

## 2023-08-17 DIAGNOSIS — I493 Ventricular premature depolarization: Secondary | ICD-10-CM | POA: Diagnosis not present

## 2023-08-17 DIAGNOSIS — R279 Unspecified lack of coordination: Secondary | ICD-10-CM | POA: Insufficient documentation

## 2023-08-17 DIAGNOSIS — M25552 Pain in left hip: Secondary | ICD-10-CM | POA: Diagnosis not present

## 2023-08-17 DIAGNOSIS — M62838 Other muscle spasm: Secondary | ICD-10-CM | POA: Insufficient documentation

## 2023-08-17 DIAGNOSIS — R102 Pelvic and perineal pain: Secondary | ICD-10-CM | POA: Diagnosis not present

## 2023-08-17 NOTE — Therapy (Signed)
 OUTPATIENT PHYSICAL THERAPY FEMALE PELVIC TREATMENT   Patient Name: Michelle Gray MRN: [id number] DOB:[birth year], 68 y.o., female Today's Date: 08/17/2023  END OF SESSION:  PT End of Session - 08/17/23 0934     Visit Number 29    Date for PT Re-Evaluation 10/07/23    Authorization Type [employer] - Medicare    Progress Note Due on Visit 30    PT Start Time 0931    PT Stop Time 1012    PT Time Calculation (min) 41 min    Activity Tolerance Patient tolerated treatment well    Behavior During Therapy [facility] for tasks assessed/performed             Past Medical History:  Diagnosis Date   Arthritis    Asthma    Cancer (HCC) 1990   right breast ca-mast with reconstr   Cancer [facility]) 12/2020   left breast DCIS   Eczema    dx by derm per patient   Fibromyalgia    Hyperlipidemia    Hypothyroidism    Neuromuscular disorder (HCC)    Osteoporosis    PONV (postoperative nausea and vomiting)    PVC (premature ventricular contraction)    Thyroid  disease    Past Surgical History:  Procedure Laterality Date   APPENDECTOMY     ARTHRODESIS METATARSALPHALANGEAL JOINT (MTPJ) Left 10/15/2022   Procedure: ARTHRODESIS METATARSALPHALANGEAL JOINT (MTPJ);  Surgeon: Kit Rush, MD;  Location: [facility];  Service: Orthopedics;  Laterality: Left;   BREAST IMPLANT REMOVAL Right 02/11/2021   Procedure: REMOVAL RIGHT BREAST IMPLANT;  Surgeon: Arelia Filippo, MD;  Location: [facility];  Service: Plastics;  Laterality: Right;   BREAST SURGERY Right 1990   Mastectomy   CAPSULECTOMY Right 02/11/2021   Procedure: CAPSULECTOMY;  Surgeon: Arelia Filippo, MD;  Location: [facility];  Service: Plastics;  Laterality: Right;   FOOT SURGERY     LAPROSCOPIC     TOTAL MASTECTOMY Left 02/11/2021   Procedure: LEFT TOTAL MASTECTOMY;  Surgeon: Ebbie Cough, MD;  Location: [facility];  Service: General;  Laterality: Left;   WISDOM TEETH REMOVAL      Patient Active Problem List   Diagnosis Date Noted   Breast cancer, left breast (HCC) 02/11/2021   Cough variant asthma with ? component upper airway cough syndrome 12/23/2018   Primary osteoarthritis of both hands 02/21/2016   Fibromyalgia 02/21/2016   History of hypothyroidism 02/21/2016   Dyslipidemia 02/21/2016   History of breast cancer 02/21/2016   Age-related osteoporosis without current pathological fracture 02/21/2016   Hypothyroidism 08/06/2013   Other and unspecified hyperlipidemia 08/06/2013   Insomnia 08/06/2013   Intrinsic asthma 08/06/2013   Hip pain, left 01/12/2011   Plantar fasciitis 01/12/2011    PCP: Aisha Harvey, MD  REFERRING PROVIDER: Barbette Knock, MD  REFERRING DIAG: R29.898 (ICD-10-CM) - Other symptoms and signs involving the musculoskeletal system  THERAPY DIAG:  Pain in left hip  Unspecified lack of coordination  Other muscle spasm  Pelvic pain  Rationale for Evaluation and Treatment: Rehabilitation  ONSET DATE: 6 months   SUBJECTIVE:  SUBJECTIVE STATEMENT: Pt states that her pain has been worse lately; she has not been able to work on exercises as much.   PAIN: 08/17/23/25 Are you having pain? Yes NPRS scale: 4/10, but later in the day after yard work it can get up to a 10/10 - can is across low back and feels like pressure  Pain location: pelvic pain, Lt side, wraps around into groin and Lt side of sacrum   Pain type: aching and burning Pain description: intermittent   Aggravating factors: sitting, later in the day Relieving factors: stretches (pelvic tilts, hamstring stretches), ice  PRECAUTIONS: None  RED FLAGS: None   WEIGHT BEARING RESTRICTIONS: No  FALLS:  Has patient fallen in last 6 months? No  LIVING ENVIRONMENT: Lives with:  lives with their family Lives in: House/apartment   OCCUPATION: retired  PLOF: Independent  PATIENT GOALS: decrease Lt hip/pelvic pain  PERTINENT HISTORY:  Appendectomy, double mastectomy/breast cancer  BOWEL MOVEMENT: Pain with bowel movement: No Type of bowel movement:Frequency 1x/day and Strain No Fully empty rectum: Yes: - Leakage: No Pads: No Fiber supplement: No  URINATION: Pain with urination: No Fully empty bladder: Yes: - Stream: Strong Urgency: Yes: occasional Frequency: unsure daily, 1-2x/night Leakage: none - she is now having some stress incontinence since foot surgery.  Pads: No  INTERCOURSE: Pain with intercourse: No pain Ability to have vaginal penetration:  Yes: - Climax: WNL Marinoff Scale: 0/3  PREGNANCY: None  PROLAPSE: none   OBJECTIVE:  07/15/23: LUMBARAROM/PROM:  A/PROM A/PROM  07/15/23 (% available)  Flexion 75  Extension 100  Right lateral flexion 80  Left lateral flexion 80  Right rotation 75  Left rotation 75   (Blank rows = not tested)  Single leg stance: -Rt: initially pelvic drop; she could feel and was able to correct to some extent, but rotated to the Lt in order to hold more stable  -Lt: Overall stable with light UE support  Palpation:   06/01/23 Decreased lumbar lordosis  Significant bil hip flexor restriction  04/12/23 Improving Lt adductor restriction, obturator restriction Still tender at Lt ischial tuberosity and inferior pubic ramus  Notable restriction in Rt obliques/lats/lumbar paraspinals  02/24/23 Significant restriction in Lt obturator internus with tenderness Adductor restriction and fascial restriction over inside of ischial tuberosity and obturator foramen  Trigger points throughout Lt glutes and tender sacral attachments.   12/22/22                             Internal Pelvic Floor burning around superficial pelvic floor layers; pain/tightness in Lt levator ani  Patient confirms identification  and approves PT to assess internal pelvic floor and treatment Yes  PELVIC MMT:   MMT eval  Vaginal 4/5, 8 second endurance, 6 repeat contractions with decreasing coordination  (Blank rows = not tested)        TONE: Increased tone in Lt deep pelvic floor  PROLAPSE: WNL  09/28/22: COGNITION: Overall cognitive status: Within functional limits for tasks assessed     SENSATION: Light touch: Appears intact Proprioception: Appears intact  FUNCTIONAL TESTS:  Squat:Rt valgus knee collapse, anterior weight shift Single leg stance: Lt, pelvic collapse on Rt, 5 seconds; Rt stable pelvis >10 seconds  GAIT: Comments: WNL  POSTURE: rounded shoulders, forward head, decreased lumbar lordosis, decreased thoracic kyphosis, anterior pelvic tilt, and Lt lower thoracolumbar scoliosis  LUMBARAROM/PROM:  A/PROM A/PROM  Eval (% available)  Flexion 75  Extension 100  Right  lateral flexion 50  Left lateral flexion 50  Right rotation 50  Left rotation 50   (Blank rows = not tested)  PALPATION:   General  significant Lt chest wall scar tissue that is limiting rib cage mobility; decreased bil lateral rib cage excursion, generalized abdominal tightness likely due to bracing form low back pain and fear of movement                External Perineal Exam WNL                             Internal Pelvic Floor some burning at posterior fourchette; some mild tenderness and increased tension in Lt levator ani; mild atrophy in Rt levator ani  Patient confirms identification and approves PT to assess internal pelvic floor and treatment Yes  PELVIC MMT:   MMT eval  Vaginal 3/5, 2 second endurance, 6 repeat contractions with decreasing coordination  Internal Anal Sphincter   External Anal Sphincter   Puborectalis   Diastasis Recti WNL  (Blank rows = not tested)        TONE: Slight increase in Rt levator ani  PROLAPSE: WNL  TODAY'S TREATMENT:                                                                                                                               DATE:  08/17/23 Manual: Negative pressure soft tissue mobilization to low back and glutes Exercises: Manual P/ROM and stretch in prone to hip flexors/quad 4 min bil Manual P/ROM and stretch in prone to ER/IR flexors/quad 4 min bil Therapeutic activities: Dead lifts 10x Single leg dead lift 10x bil Bridge with unilateral chest press 10x bil 5 lbs  Overhead bil shoulder flexion in supine 5 lbs hook lying 20x   07/15/23 RE-EVAL Manual: Reassessment of lumbar A/ROM and pelvic stability in single leg stance Soft tissue mobilization to Lt adductors, bil lumbar paraspinals, bil quadratus lumborum, and bil glutes Myofascial/trigger point release to Lt obturator internus   06/08/23 Manual: External obturator internus trigger point release and soft tissue mobilization in Lt side lying Neuromuscular re-education: Staggered pallof press red band 2 x 10 bil Standing shoulder extension red band 3 x 10 Standing 3 way kick 10x each, bil  Standing lateral lunge with cues for form and better mobility 12x bil     PATIENT EDUCATION:  Education details: See above Person educated: Patient Education method: Programmer, multimedia, Demonstration, Tactile cues, Verbal cues, and Handouts Education comprehension: verbalized understanding  HOME EXERCISE PROGRAM: [id number]  ASSESSMENT:  CLINICAL IMPRESSION: Pt having more pain recently, but likely due to decreased exercise and postural contribution. Believe that part of her postural contribution is hip flexor tightness and this could be directly impacting low back pain. We worked on passive stretching and P/ROM to bil hip flexors and negative pressure soft tissue mobilization to low back and she reported decrease in pain  to 1.5/10. She was also able to perform strengthening and had direct improvement in low back pain when she corrected posture and actively thought about core facilitation.  HEP updated with exercises.  She will continue to benefit from skilled PT intervention in order to improve pain, decrease abdominal and chest scar tissue restriction, and begin/progress functional strengthening program.   OBJECTIVE IMPAIRMENTS: decreased activity tolerance, decreased coordination, decreased endurance, decreased strength, increased fascial restrictions, increased muscle spasms, impaired tone, postural dysfunction, and pain.   ACTIVITY LIMITATIONS: bending, sitting, squatting, and locomotion level  PARTICIPATION LIMITATIONS: community activity  PERSONAL FACTORS: 1 comorbidity: medical history  are also affecting patient's functional outcome.   REHAB POTENTIAL: Good  CLINICAL DECISION MAKING: Stable/uncomplicated  EVALUATION COMPLEXITY: Low   GOALS: Goals reviewed with patient? Yes  SHORT TERM GOALS: Updated 08/17/23  Pt will be independent with HEP.   Baseline: Goal status: MET 02/02/23  2.  Pt will be independent with diaphragmatic breathing and down training activities in order to improve pelvic floor/abdominal relaxation.  Baseline:  Goal status: MET 02/02/23  3.  Pt will be independent with chest wall scar tissue mobilization techniques and perform at least 2x/week.  Baseline:  Goal status: IN PROGRESS 08/17/23   LONG TERM GOALS: updated 08/17/23  Pt will be independent with advanced HEP.   Baseline:  Goal status: IN PROGRESS 08/17/23  2.  Pt will demonstrate normal pelvic floor muscle tone and A/ROM, able to achieve 4/5 strength with contractions and 10 sec endurance, in order to provide appropriate lumbopelvic support in functional activities.   Baseline: 4/5 strength, still working on endurance Goal status: IN PROGRESS 08/17/23  3.  Pt will report no Lt pelvic pain higher than 3/10.  Baseline: patient is having higher pain levels over the last several weeks exceeding 3/10 Goal status: MET 04/12/23  4.  Pt will be able to sit for longer than 60  minutes without increase in pelvic pain. Baseline: driving long distances still aggravating and any prolonged sitting Goal status: IN PROGRESS 08/17/23  5.  Pt will be able to perform single leg stance >20 seconds bil without pelvic drop.  Baseline:  Goal status: IN PROGRESS 08/17/23  6.  Pt will increase all impaired lumbar A/ROM by 25% without pain.  Baseline:  Goal status: IN PROGRESS 08/17/23  PLAN:  PT FREQUENCY: 1-2x/week  PT DURATION: 12 weeks  PLANNED INTERVENTIONS: Therapeutic exercises, Therapeutic activity, Neuromuscular re-education, Balance training, Gait training, Patient/Family education, Self Care, Joint mobilization, Dry Needling, Biofeedback, and Manual therapy  PLAN FOR NEXT SESSION: Continue core training; mobility exercises for pelvic floor; possible dry needling/myofascial release to Lt pelvic girdle    Josette Mares, PT, DPT07/15/2510:11 AM

## 2023-08-23 ENCOUNTER — Other Ambulatory Visit: Payer: Self-pay | Admitting: Physician Assistant

## 2023-08-23 NOTE — Telephone Encounter (Signed)
 Last Fill: 07/26/2023   Next Visit: 01/17/2024   Last Visit: 07/13/2023   Dx: Primary insomnia     Current Dose per office note on 01/14/2023:    Okay to refill Ambien ?

## 2023-08-28 ENCOUNTER — Ambulatory Visit: Payer: Self-pay | Admitting: Cardiology

## 2023-08-28 DIAGNOSIS — I493 Ventricular premature depolarization: Secondary | ICD-10-CM

## 2023-09-07 ENCOUNTER — Ambulatory Visit: Attending: Obstetrics & Gynecology

## 2023-09-07 DIAGNOSIS — M62838 Other muscle spasm: Secondary | ICD-10-CM | POA: Diagnosis not present

## 2023-09-07 DIAGNOSIS — R279 Unspecified lack of coordination: Secondary | ICD-10-CM | POA: Diagnosis not present

## 2023-09-07 DIAGNOSIS — R102 Pelvic and perineal pain: Secondary | ICD-10-CM | POA: Diagnosis not present

## 2023-09-07 DIAGNOSIS — Z170 Estrogen receptor positive status [ER+]: Secondary | ICD-10-CM | POA: Diagnosis not present

## 2023-09-07 DIAGNOSIS — M25552 Pain in left hip: Secondary | ICD-10-CM | POA: Diagnosis not present

## 2023-09-07 DIAGNOSIS — M5459 Other low back pain: Secondary | ICD-10-CM | POA: Diagnosis not present

## 2023-09-07 DIAGNOSIS — M6281 Muscle weakness (generalized): Secondary | ICD-10-CM | POA: Insufficient documentation

## 2023-09-07 DIAGNOSIS — C50912 Malignant neoplasm of unspecified site of left female breast: Secondary | ICD-10-CM | POA: Diagnosis not present

## 2023-09-07 NOTE — Therapy (Addendum)
 OUTPATIENT PHYSICAL THERAPY FEMALE PELVIC TREATMENT  Progress Note Reporting Period 04/05/23 to 09/07/23  See note below for Objective Data and Assessment of Progress/Goals.     Patient Name: Michelle Gray MRN: [id number] DOB:[birth year], 68 y.o., female Today's Date: 09/07/2023  END OF SESSION:  PT End of Session - 09/07/23 0848     Visit Number 30    Date for PT Re-Evaluation 10/07/23    Authorization Type [employer] - Medicare    Progress Note Due on Visit 40    PT Start Time [phone]    PT Stop Time 0927    PT Time Calculation (min) 41 min    Activity Tolerance Patient tolerated treatment well    Behavior During Therapy [facility] for tasks assessed/performed             Past Medical History:  Diagnosis Date   Arthritis    Asthma    Cancer (HCC) 1990   right breast ca-mast with reconstr   Cancer [facility]) 12/2020   left breast DCIS   Eczema    dx by derm per patient   Fibromyalgia    Hyperlipidemia    Hypothyroidism    Neuromuscular disorder (HCC)    Osteoporosis    PONV (postoperative nausea and vomiting)    PVC (premature ventricular contraction)    Thyroid  disease    Past Surgical History:  Procedure Laterality Date   APPENDECTOMY     ARTHRODESIS METATARSALPHALANGEAL JOINT (MTPJ) Left 10/15/2022   Procedure: ARTHRODESIS METATARSALPHALANGEAL JOINT (MTPJ);  Surgeon: Kit Rush, MD;  Location: [facility];  Service: Orthopedics;  Laterality: Left;   BREAST IMPLANT REMOVAL Right 02/11/2021   Procedure: REMOVAL RIGHT BREAST IMPLANT;  Surgeon: Arelia Filippo, MD;  Location: [facility];  Service: Plastics;  Laterality: Right;   BREAST SURGERY Right 1990   Mastectomy   CAPSULECTOMY Right 02/11/2021   Procedure: CAPSULECTOMY;  Surgeon: Arelia Filippo, MD;  Location: [facility];  Service: Plastics;  Laterality: Right;   FOOT SURGERY     LAPROSCOPIC     TOTAL MASTECTOMY Left 02/11/2021   Procedure: LEFT TOTAL MASTECTOMY;  Surgeon:  Ebbie Cough, MD;  Location: [facility];  Service: General;  Laterality: Left;   WISDOM TEETH REMOVAL     Patient Active Problem List   Diagnosis Date Noted   Breast cancer, left breast (HCC) 02/11/2021   Cough variant asthma with ? component upper airway cough syndrome 12/23/2018   Primary osteoarthritis of both hands 02/21/2016   Fibromyalgia 02/21/2016   History of hypothyroidism 02/21/2016   Dyslipidemia 02/21/2016   History of breast cancer 02/21/2016   Age-related osteoporosis without current pathological fracture 02/21/2016   Hypothyroidism 08/06/2013   Other and unspecified hyperlipidemia 08/06/2013   Insomnia 08/06/2013   Intrinsic asthma 08/06/2013   Hip pain, left 01/12/2011   Plantar fasciitis 01/12/2011    PCP: Aisha Harvey, MD  REFERRING PROVIDER: Barbette Knock, MD  REFERRING DIAG: R29.898 (ICD-10-CM) - Other symptoms and signs involving the musculoskeletal system  THERAPY DIAG:  Pain in left hip  Unspecified lack of coordination  Other muscle spasm  Pelvic pain  Other low back pain  Muscle weakness (generalized)  Rationale for Evaluation and Treatment: Rehabilitation  ONSET DATE: 6 months   SUBJECTIVE:  SUBJECTIVE STATEMENT: Pt states that pain has not been as bad as it has been. She states that she has been doing more exercise.   PAIN: 08/17/23/25 Are you having pain? Yes NPRS scale: 1/10 Pain location: pelvic pain, Lt side, wraps around into groin and Lt side of sacrum   Pain type: aching and burning Pain description: intermittent   Aggravating factors: sitting, later in the day Relieving factors: stretches (pelvic tilts, hamstring stretches), ice  PRECAUTIONS: None  RED FLAGS: None   WEIGHT BEARING RESTRICTIONS: No  FALLS:  Has  patient fallen in last 6 months? No  LIVING ENVIRONMENT: Lives with: lives with their family Lives in: House/apartment   OCCUPATION: retired  PLOF: Independent  PATIENT GOALS: decrease Lt hip/pelvic pain  PERTINENT HISTORY:  Appendectomy, double mastectomy/breast cancer  BOWEL MOVEMENT: Pain with bowel movement: No Type of bowel movement:Frequency 1x/day and Strain No Fully empty rectum: Yes: - Leakage: No Pads: No Fiber supplement: No  URINATION: Pain with urination: No Fully empty bladder: Yes: - Stream: Strong Urgency: Yes: occasional Frequency: unsure daily, 1-2x/night Leakage: none - she is now having some stress incontinence since foot surgery.  Pads: No  INTERCOURSE: Pain with intercourse: No pain Ability to have vaginal penetration:  Yes: - Climax: WNL Marinoff Scale: 0/3  PREGNANCY: None  PROLAPSE: none   OBJECTIVE:  07/15/23: LUMBARAROM/PROM:  A/PROM A/PROM  07/15/23 (% available)  Flexion 75  Extension 100  Right lateral flexion 80  Left lateral flexion 80  Right rotation 75  Left rotation 75   (Blank rows = not tested)  Single leg stance: -Rt: initially pelvic drop; she could feel and was able to correct to some extent, but rotated to the Lt in order to hold more stable  -Lt: Overall stable with light UE support  Palpation:   06/01/23 Decreased lumbar lordosis  Significant bil hip flexor restriction  04/12/23 Improving Lt adductor restriction, obturator restriction Still tender at Lt ischial tuberosity and inferior pubic ramus  Notable restriction in Rt obliques/lats/lumbar paraspinals  02/24/23 Significant restriction in Lt obturator internus with tenderness Adductor restriction and fascial restriction over inside of ischial tuberosity and obturator foramen  Trigger points throughout Lt glutes and tender sacral attachments.   12/22/22                             Internal Pelvic Floor burning around superficial pelvic floor  layers; pain/tightness in Lt levator ani  Patient confirms identification and approves PT to assess internal pelvic floor and treatment Yes  PELVIC MMT:   MMT eval  Vaginal 4/5, 8 second endurance, 6 repeat contractions with decreasing coordination  (Blank rows = not tested)        TONE: Increased tone in Lt deep pelvic floor  PROLAPSE: WNL  09/28/22: COGNITION: Overall cognitive status: Within functional limits for tasks assessed     SENSATION: Light touch: Appears intact Proprioception: Appears intact  FUNCTIONAL TESTS:  Squat:Rt valgus knee collapse, anterior weight shift Single leg stance: Lt, pelvic collapse on Rt, 5 seconds; Rt stable pelvis >10 seconds  GAIT: Comments: WNL  POSTURE: rounded shoulders, forward head, decreased lumbar lordosis, decreased thoracic kyphosis, anterior pelvic tilt, and Lt lower thoracolumbar scoliosis  LUMBARAROM/PROM:  A/PROM A/PROM  Eval (% available)  Flexion 75  Extension 100  Right lateral flexion 50  Left lateral flexion 50  Right rotation 50  Left rotation 50   (Blank rows = not tested)  PALPATION:   General  significant Lt chest wall scar tissue that is limiting rib cage mobility; decreased bil lateral rib cage excursion, generalized abdominal tightness likely due to bracing form low back pain and fear of movement                External Perineal Exam WNL                             Internal Pelvic Floor some burning at posterior fourchette; some mild tenderness and increased tension in Lt levator ani; mild atrophy in Rt levator ani  Patient confirms identification and approves PT to assess internal pelvic floor and treatment Yes  PELVIC MMT:   MMT eval  Vaginal 3/5, 2 second endurance, 6 repeat contractions with decreasing coordination  Internal Anal Sphincter   External Anal Sphincter   Puborectalis   Diastasis Recti WNL  (Blank rows = not tested)        TONE: Slight increase in Rt levator  ani  PROLAPSE: WNL  TODAY'S TREATMENT:                                                                                                                              DATE:  09/07/23 Manual: Negative pressure soft tissue mobilization to low back and glutes Neuromuscular re-education: Manual P/ROM and stretch in prone to hip flexors/quad 4 min bil Manual P/ROM and stretch in prone to ER/IR flexors/quad 4 min bil Therapeutic activities: Staggered dead lift with cross body tap/rotation 10 lbs kettle bell 10x bil Standing shoulder extensions with marching green band 2 x 10 Single leg hip hinge with 10 lb row at the bottom 6x bil   08/17/23 Manual: Negative pressure soft tissue mobilization to low back and glutes Exercises: Manual P/ROM and stretch in prone to hip flexors/quad 4 min bil Manual P/ROM and stretch in prone to ER/IR flexors/quad 4 min bil Therapeutic activities: Dead lifts 10x Single leg dead lift 10x bil Bridge with unilateral chest press 10x bil 5 lbs  Overhead bil shoulder flexion in supine 5 lbs hook lying 20x   07/15/23 RE-EVAL Manual: Reassessment of lumbar A/ROM and pelvic stability in single leg stance Soft tissue mobilization to Lt adductors, bil lumbar paraspinals, bil quadratus lumborum, and bil glutes Myofascial/trigger point release to Lt obturator internus   PATIENT EDUCATION:  Education details: See above Person educated: Patient Education method: Programmer, multimedia, Demonstration, Tactile cues, Verbal cues, and Handouts Education comprehension: verbalized understanding  HOME EXERCISE PROGRAM: [id number]  ASSESSMENT:  CLINICAL IMPRESSION: Pt doing much better recently with lower pain levels; this is likely due to increased activity level. She still describes the pain getting worse throughout the day. Due to improvement after last session, we continued manual techniques and stretching today with great tolerance. She is the most restricted in hip flexors;  believe this will help continue to improve her low back pain. She did  well with all strengthening progressions, working with balance to help provide increased challenge. She will continue to benefit from skilled PT intervention in order to improve pain, decrease abdominal and chest scar tissue restriction, and begin/progress functional strengthening program.   OBJECTIVE IMPAIRMENTS: decreased activity tolerance, decreased coordination, decreased endurance, decreased strength, increased fascial restrictions, increased muscle spasms, impaired tone, postural dysfunction, and pain.   ACTIVITY LIMITATIONS: bending, sitting, squatting, and locomotion level  PARTICIPATION LIMITATIONS: community activity  PERSONAL FACTORS: 1 comorbidity: medical history  are also affecting patient's functional outcome.   REHAB POTENTIAL: Good  CLINICAL DECISION MAKING: Stable/uncomplicated  EVALUATION COMPLEXITY: Low   GOALS: Goals reviewed with patient? Yes  SHORT TERM GOALS: Updated 09/07/23  Pt will be independent with HEP.   Baseline: Goal status: MET 02/02/23  2.  Pt will be independent with diaphragmatic breathing and down training activities in order to improve pelvic floor/abdominal relaxation.  Baseline:  Goal status: MET 02/02/23  3.  Pt will be independent with chest wall scar tissue mobilization techniques and perform at least 2x/week.  Baseline:  Goal status: IN PROGRESS 09/07/23   LONG TERM GOALS: updated 09/07/23  Pt will be independent with advanced HEP.   Baseline:  Goal status: IN PROGRESS 09/07/23  2.  Pt will demonstrate normal pelvic floor muscle tone and A/ROM, able to achieve 4/5 strength with contractions and 10 sec endurance, in order to provide appropriate lumbopelvic support in functional activities.   Baseline: 4/5 strength, still working on endurance Goal status: IN PROGRESS 09/07/23  3.  Pt will report no Lt pelvic pain higher than 3/10.  Baseline: patient is having  higher pain levels over the last several weeks exceeding 3/10 Goal status: MET 04/12/23  4.  Pt will be able to sit for longer than 60 minutes without increase in pelvic pain. Baseline: driving long distances still aggravating and any prolonged sitting Goal status: IN PROGRESS 09/07/23  5.  Pt will be able to perform single leg stance >20 seconds bil without pelvic drop.  Baseline:  Goal status: IN PROGRESS 09/07/23  6.  Pt will increase all impaired lumbar A/ROM by 25% without pain.  Baseline:  Goal status: IN PROGRESS 09/07/23  PLAN:  PT FREQUENCY: 1-2x/week  PT DURATION: 12 weeks  PLANNED INTERVENTIONS: Therapeutic exercises, Therapeutic activity, Neuromuscular re-education, Balance training, Gait training, Patient/Family education, Self Care, Joint mobilization, Dry Needling, Biofeedback, and Manual therapy  PLAN FOR NEXT SESSION: Continue core training; mobility exercises for pelvic floor; possible dry needling/myofascial release to Lt pelvic girdle    Josette Mares, PT, DPT08/05/259:24 AM

## 2023-09-14 DIAGNOSIS — Z9889 Other specified postprocedural states: Secondary | ICD-10-CM | POA: Diagnosis not present

## 2023-09-14 DIAGNOSIS — R52 Pain, unspecified: Secondary | ICD-10-CM | POA: Diagnosis not present

## 2023-09-14 DIAGNOSIS — Q6689 Other  specified congenital deformities of feet: Secondary | ICD-10-CM | POA: Diagnosis not present

## 2023-09-16 DIAGNOSIS — Q6689 Other  specified congenital deformities of feet: Secondary | ICD-10-CM | POA: Diagnosis not present

## 2023-09-20 ENCOUNTER — Other Ambulatory Visit: Payer: Self-pay | Admitting: Rheumatology

## 2023-09-20 NOTE — Telephone Encounter (Signed)
 Last Fill: 06/25/2023  Next Visit: 01/17/2024  Last Visit: 07/13/2023  Dx: Fibromyalgia  Current Dose per office note on 07/13/2023: gabapentin  300 mg p.o. nightly  Okay to refill Gabapentin ?

## 2023-09-21 ENCOUNTER — Other Ambulatory Visit: Payer: Self-pay | Admitting: *Deleted

## 2023-09-21 MED ORDER — ZOLPIDEM TARTRATE 10 MG PO TABS: 10.0000 mg | ORAL_TABLET | Freq: Every day | ORAL | 0 refills | Status: DC

## 2023-09-21 NOTE — Telephone Encounter (Signed)
 Last Fill: 08/23/2023  Next Visit: 01/17/2024  Last Visit: 07/13/2023  Dx: Primary insomnia   Current Dose per office note on 07/13/2023: not discussed  Okay to refill Ambien ?

## 2023-09-21 NOTE — Telephone Encounter (Signed)
 Patient contacted the office to request a medication refill.   1. Name of Medication: Ambien   2. How are you currently taking this medication (dosage and times per day)? 10 nightly   3. What pharmacy would you like for that to be sent to? [facility]

## 2023-10-07 ENCOUNTER — Ambulatory Visit: Attending: Obstetrics & Gynecology

## 2023-10-07 DIAGNOSIS — M62838 Other muscle spasm: Secondary | ICD-10-CM | POA: Diagnosis not present

## 2023-10-07 DIAGNOSIS — M5459 Other low back pain: Secondary | ICD-10-CM | POA: Insufficient documentation

## 2023-10-07 DIAGNOSIS — M6281 Muscle weakness (generalized): Secondary | ICD-10-CM | POA: Insufficient documentation

## 2023-10-07 DIAGNOSIS — R279 Unspecified lack of coordination: Secondary | ICD-10-CM | POA: Insufficient documentation

## 2023-10-07 DIAGNOSIS — R102 Pelvic and perineal pain: Secondary | ICD-10-CM | POA: Diagnosis not present

## 2023-10-07 DIAGNOSIS — M25552 Pain in left hip: Secondary | ICD-10-CM | POA: Insufficient documentation

## 2023-10-07 NOTE — Therapy (Signed)
 OUTPATIENT PHYSICAL THERAPY FEMALE PELVIC TREATMENT    Patient Name: Michelle Gray MRN: [id number] DOB:Mar 15, 1955, 68 y.o., female Today's Date: 10/07/2023  END OF SESSION:  PT End of Session - 10/07/23 0845     Visit Number 31    Authorization Type [employer] - Medicare    Progress Note Due on Visit 40    PT Start Time 0845    PT Stop Time 0926    PT Time Calculation (min) 41 min    Activity Tolerance Patient tolerated treatment well    Behavior During Therapy [facility] for tasks assessed/performed              Past Medical History:  Diagnosis Date   Arthritis    Asthma    Cancer (HCC) 1990   right breast ca-mast with reconstr   Cancer [facility]) 12/2020   left breast DCIS   Eczema    dx by derm per patient   Fibromyalgia    Hyperlipidemia    Hypothyroidism    Neuromuscular disorder (HCC)    Osteoporosis    PONV (postoperative nausea and vomiting)    PVC (premature ventricular contraction)    Thyroid  disease    Past Surgical History:  Procedure Laterality Date   APPENDECTOMY     ARTHRODESIS METATARSALPHALANGEAL JOINT (MTPJ) Left 10/15/2022   Procedure: ARTHRODESIS METATARSALPHALANGEAL JOINT (MTPJ);  Surgeon: Kit Rush, MD;  Location: [facility];  Service: Orthopedics;  Laterality: Left;   BREAST IMPLANT REMOVAL Right 02/11/2021   Procedure: REMOVAL RIGHT BREAST IMPLANT;  Surgeon: Arelia Filippo, MD;  Location: [facility];  Service: Plastics;  Laterality: Right;   BREAST SURGERY Right 1990   Mastectomy   CAPSULECTOMY Right 02/11/2021   Procedure: CAPSULECTOMY;  Surgeon: Arelia Filippo, MD;  Location: [facility];  Service: Plastics;  Laterality: Right;   FOOT SURGERY     LAPROSCOPIC     TOTAL MASTECTOMY Left 02/11/2021   Procedure: LEFT TOTAL MASTECTOMY;  Surgeon: Ebbie Cough, MD;  Location: [facility];  Service: General;  Laterality: Left;   WISDOM TEETH REMOVAL     Patient Active Problem List    Diagnosis Date Noted   Breast cancer, left breast (HCC) 02/11/2021   Cough variant asthma with ? component upper airway cough syndrome 12/23/2018   Primary osteoarthritis of both hands 02/21/2016   Fibromyalgia 02/21/2016   History of hypothyroidism 02/21/2016   Dyslipidemia 02/21/2016   History of breast cancer 02/21/2016   Age-related osteoporosis without current pathological fracture 02/21/2016   Hypothyroidism 08/06/2013   Other and unspecified hyperlipidemia 08/06/2013   Insomnia 08/06/2013   Intrinsic asthma 08/06/2013   Hip pain, left 01/12/2011   Plantar fasciitis 01/12/2011    PCP: Aisha Harvey, MD  REFERRING PROVIDER: Barbette Knock, MD  REFERRING DIAG: R29.898 (ICD-10-CM) - Other symptoms and signs involving the musculoskeletal system  THERAPY DIAG:  Pain in left hip  Unspecified lack of coordination  Other muscle spasm  Pelvic pain  Other low back pain  Muscle weakness (generalized)  Rationale for Evaluation and Treatment: Rehabilitation  ONSET DATE: 6 months   SUBJECTIVE:  SUBJECTIVE STATEMENT: Pt states that she had continued to have Lt foot pain and had procedure done in office to release 2nd toe flexor tendon. She states that she had difficulty ambulating and was told 12 weeks for full recovery. She has tried to do some exercises, but hasn't been able to do anything standing. Low back is slightly tighter than normal due to spending so much time in the car. She feels like any time she cannot get up and move more her low back and pelvic pain feels better. She is using pelvic floor muscle wand about once a month.   PAIN: 10/07/23 Are you having pain? Yes NPRS scale: 3/10 Pain location: pelvic pain, Lt side, wraps around into groin and Lt side of sacrum   Pain type: aching  and burning Pain description: intermittent   Aggravating factors: sitting, later in the day Relieving factors: stretches (pelvic tilts, hamstring stretches), ice  PRECAUTIONS: None  RED FLAGS: None   WEIGHT BEARING RESTRICTIONS: No  FALLS:  Has patient fallen in last 6 months? No  LIVING ENVIRONMENT: Lives with: lives with their family Lives in: House/apartment   OCCUPATION: retired  PLOF: Independent  PATIENT GOALS: decrease Lt hip/pelvic pain  PERTINENT HISTORY:  Appendectomy, double mastectomy/breast cancer  BOWEL MOVEMENT: Pain with bowel movement: No Type of bowel movement:Frequency 1x/day and Strain No Fully empty rectum: Yes: - Leakage: No Pads: No Fiber supplement: No  URINATION: Pain with urination: No Fully empty bladder: Yes: - Stream: Strong Urgency: Yes: occasional Frequency: unsure daily, 1-2x/night Leakage: none - she is now having some stress incontinence since foot surgery.  Pads: No  INTERCOURSE: Pain with intercourse: No pain Ability to have vaginal penetration:  Yes: - Climax: WNL Marinoff Scale: 0/3  PREGNANCY: None  PROLAPSE: none   OBJECTIVE:  10/07/2023: LUMBARAROM/PROM:  A/PROM A/PROM  10/07/23 (% available)  Flexion 75  Extension 100  Right lateral flexion 80  Left lateral flexion 80  Right rotation 75  Left rotation 75   (Blank rows = not tested)  Single leg stance: -Rt: initially pelvic drop; she could feel and was able to correct to some extent, but rotated to the Lt in order to hold more stable  -Lt: Overall stable with light UE support  Palpation:   07/15/23: LUMBARAROM/PROM:  A/PROM A/PROM  07/15/23 (% available)  Flexion 90  Extension 100  Right lateral flexion 90  Left lateral flexion 90  Right rotation 90  Left rotation 90   (Blank rows = not tested)  Single leg stance: -Rt: Overall stable with some UE support -Lt: Overall stable with light UE support   06/01/23 Decreased lumbar lordosis   Significant bil hip flexor restriction  04/12/23 Improving Lt adductor restriction, obturator restriction Still tender at Lt ischial tuberosity and inferior pubic ramus  Notable restriction in Rt obliques/lats/lumbar paraspinals  02/24/23 Significant restriction in Lt obturator internus with tenderness Adductor restriction and fascial restriction over inside of ischial tuberosity and obturator foramen  Trigger points throughout Lt glutes and tender sacral attachments.   12/22/22                             Internal Pelvic Floor burning around superficial pelvic floor layers; pain/tightness in Lt levator ani  Patient confirms identification and approves PT to assess internal pelvic floor and treatment Yes  PELVIC MMT:   MMT eval  Vaginal 4/5, 8 second endurance, 6 repeat contractions with decreasing coordination  (  Blank rows = not tested)        TONE: Increased tone in Lt deep pelvic floor  PROLAPSE: WNL  09/28/22: COGNITION: Overall cognitive status: Within functional limits for tasks assessed     SENSATION: Light touch: Appears intact Proprioception: Appears intact  FUNCTIONAL TESTS:  Squat:Rt valgus knee collapse, anterior weight shift Single leg stance: Lt, pelvic collapse on Rt, 5 seconds; Rt stable pelvis >10 seconds  GAIT: Comments: WNL  POSTURE: rounded shoulders, forward head, decreased lumbar lordosis, decreased thoracic kyphosis, anterior pelvic tilt, and Lt lower thoracolumbar scoliosis  LUMBARAROM/PROM:  A/PROM A/PROM  Eval (% available)  Flexion 75  Extension 100  Right lateral flexion 50  Left lateral flexion 50  Right rotation 50  Left rotation 50   (Blank rows = not tested)  PALPATION:   General  significant Lt chest wall scar tissue that is limiting rib cage mobility; decreased bil lateral rib cage excursion, generalized abdominal tightness likely due to bracing form low back pain and fear of movement                External Perineal Exam  WNL                             Internal Pelvic Floor some burning at posterior fourchette; some mild tenderness and increased tension in Lt levator ani; mild atrophy in Rt levator ani  Patient confirms identification and approves PT to assess internal pelvic floor and treatment Yes  PELVIC MMT:   MMT eval  Vaginal 3/5, 2 second endurance, 6 repeat contractions with decreasing coordination  Internal Anal Sphincter   External Anal Sphincter   Puborectalis   Diastasis Recti WNL  (Blank rows = not tested)        TONE: Slight increase in Rt levator ani  PROLAPSE: WNL  TODAY'S TREATMENT:                                                                                                                              DATE:  10/07/23 DISCHARGE Manual: Lt side lying obturator internal release externally Lt side lying adductor myofascial release Therapeutic activities: Problem solving/pt education on continued obturator pain - due to continued foot pain, her gait is altered that may be continuing to aggravate obturator internus; also having rib cage restriction from scar tissue on chest keeps pelvic floor muscles from fully going through A/ROM, also perpetuating tightness   09/07/23 Manual: Negative pressure soft tissue mobilization to low back and glutes Neuromuscular re-education: Manual P/ROM and stretch in prone to hip flexors/quad 4 min bil Manual P/ROM and stretch in prone to ER/IR flexors/quad 4 min bil Therapeutic activities: Staggered dead lift with cross body tap/rotation 10 lbs kettle bell 10x bil Standing shoulder extensions with marching green band 2 x 10 Single leg hip hinge with 10 lb row at the bottom 6x bil   08/17/23 Manual: Negative  pressure soft tissue mobilization to low back and glutes Exercises: Manual P/ROM and stretch in prone to hip flexors/quad 4 min bil Manual P/ROM and stretch in prone to ER/IR flexors/quad 4 min bil Therapeutic activities: Dead lifts  10x Single leg dead lift 10x bil Bridge with unilateral chest press 10x bil 5 lbs  Overhead bil shoulder flexion in supine 5 lbs hook lying 20x    PATIENT EDUCATION:  Education details: See above Person educated: Patient Education method: Programmer, multimedia, Demonstration, Actor cues, Verbal cues, and Handouts Education comprehension: verbalized understanding  HOME EXERCISE PROGRAM: [id number]  ASSESSMENT:  CLINICAL IMPRESSION: Pt doing well overall but does have exacerbations of pain. She has been able to perform more regular exercises, but when life gets busy and she is not able to consistently perform them she tends to have more pain. Her lumbar A/ROM continues to improve and trigger points throughout Lt hip and pelvis are doing much better, especially in obturator internus. She tolerated manual techniques today well. Due to progress and managing pain well on her own, she is prepared to D/C skilled PT intervention at this time. She was encouraged to call with any questions or concerns.   OBJECTIVE IMPAIRMENTS: decreased activity tolerance, decreased coordination, decreased endurance, decreased strength, increased fascial restrictions, increased muscle spasms, impaired tone, postural dysfunction, and pain.   ACTIVITY LIMITATIONS: bending, sitting, squatting, and locomotion level  PARTICIPATION LIMITATIONS: community activity  PERSONAL FACTORS: 1 comorbidity: medical history  are also affecting patient's functional outcome.   REHAB POTENTIAL: Good  CLINICAL DECISION MAKING: Stable/uncomplicated  EVALUATION COMPLEXITY: Low   GOALS: Goals reviewed with patient? Yes  SHORT TERM GOALS: Updated 10/07/23  Pt will be independent with HEP.   Baseline: Goal status: MET 02/02/23  2.  Pt will be independent with diaphragmatic breathing and down training activities in order to improve pelvic floor/abdominal relaxation.  Baseline:  Goal status: MET 02/02/23  3.  Pt will be independent  with chest wall scar tissue mobilization techniques and perform at least 2x/week.  Baseline:  Goal status: MET 10/07/23   LONG TERM GOALS: updated 10/07/23  Pt will be independent with advanced HEP.   Baseline:  Goal status: IN PROGRESS 10/07/23  2.  Pt will demonstrate normal pelvic floor muscle tone and A/ROM, able to achieve 4/5 strength with contractions and 10 sec endurance, in order to provide appropriate lumbopelvic support in functional activities.   Baseline: 4/5 strength, still working on endurance Goal status: DISCHARGED 10/07/23  3.  Pt will report no Lt pelvic pain higher than 3/10.  Baseline: patient is having higher pain levels over the last several weeks exceeding 3/10 Goal status: MET 04/12/23  4.  Pt will be able to sit for longer than 60 minutes without increase in pelvic pain. Baseline: still having some pain around the hour mark Goal status: DISCHARGED 10/07/23  5.  Pt will be able to perform single leg stance >20 seconds bil without pelvic drop.  Baseline:  Goal status: MET 10/07/23  6.  Pt will increase all impaired lumbar A/ROM by 25% without pain.  Baseline:  Goal status: MET 10/07/23  PLAN:  PT FREQUENCY: -  PT DURATION: -  PLANNED INTERVENTIONS: -  PLAN FOR NEXT SESSION: DC   PHYSICAL THERAPY DISCHARGE SUMMARY  Visits from Start of Care: 31  Current functional level related to goals / functional outcomes: Independent   Remaining deficits: See above   Education / Equipment: HEP   Patient agrees to discharge. Patient goals were partially  met. Patient is being discharged due to being pleased with the current functional level.    Josette Mares, PT, DPT09/04/258:45 AM

## 2023-10-18 ENCOUNTER — Other Ambulatory Visit: Payer: Self-pay | Admitting: Physician Assistant

## 2023-10-18 ENCOUNTER — Other Ambulatory Visit: Payer: Self-pay | Admitting: Rheumatology

## 2023-10-18 DIAGNOSIS — Z23 Encounter for immunization: Secondary | ICD-10-CM | POA: Diagnosis not present

## 2023-10-18 NOTE — Telephone Encounter (Signed)
 Last Fill: 07/05/2023  Labs: 05/20/2023 eGFR 74 RBC 4.12 MCH 33.2 MONO% 15.0 MONO # 0.9  Next Visit: 01/17/2024  Last Visit: 07/13/2023  DX: not mentioned  Current Dose per office note 07/13/2023: not mentioned  Okay to refill Meloxicam ?

## 2023-10-18 NOTE — Telephone Encounter (Signed)
 Last Fill: 09/21/2023  Next Visit: 01/17/2024  Last Visit: 07/13/2023  DX: Primary insomnia   Current Dose per office note on 07/13/2023: not discussed.   Okay to refill ambien ?

## 2023-10-22 DIAGNOSIS — Z23 Encounter for immunization: Secondary | ICD-10-CM | POA: Diagnosis not present

## 2023-10-25 ENCOUNTER — Other Ambulatory Visit: Payer: Self-pay | Admitting: Internal Medicine

## 2023-10-25 NOTE — Telephone Encounter (Unsigned)
 Copied from CRM #[id number]. Topic: Clinical - Medication Refill >> Oct 25, 2023 11:48 AM Joesph PARAS wrote: Medication: budesonide -formoterol  (SYMBICORT ) 160-4.5 MCG/ACT inhaler  Has the patient contacted their pharmacy? Yes - Placxed refille request through pharmacy as well  This is the patient's preferred pharmacy:  [id number] [place], [place] - [address] [place] [address] Phone: [phone] Fax: [phone]  Is this the correct pharmacy for this prescription? Yes If no, delete pharmacy and type the correct one.   Has the prescription been filled recently? No  Is the patient out of the medication? Yes - Patient will be OUT just before appointment on 10/03. Patient requesting one temporary refill to get her to that appointment.  Has the patient been seen for an appointment in the last year OR does the patient have an upcoming appointment? Yes - 10/03  Can we respond through MyChart? Yes  Agent: Please be advised that Rx refills may take up to 3 business days. We ask that you follow-up with your pharmacy.

## 2023-10-27 ENCOUNTER — Encounter: Payer: Self-pay | Admitting: Internal Medicine

## 2023-10-27 MED ORDER — BUDESONIDE-FORMOTEROL FUMARATE 160-4.5 MCG/ACT IN AERO: 2.0000 | INHALATION_SPRAY | Freq: Two times a day (BID) | RESPIRATORY_TRACT | 11 refills | Status: DC

## 2023-11-05 ENCOUNTER — Encounter: Payer: Self-pay | Admitting: Internal Medicine

## 2023-11-05 ENCOUNTER — Ambulatory Visit: Admitting: Internal Medicine

## 2023-11-05 ENCOUNTER — Ambulatory Visit: Payer: Self-pay | Admitting: Internal Medicine

## 2023-11-05 VITALS — BP 112/69 | HR 88 | Temp 97.5°F | Ht 67.0 in | Wt 175.0 lb

## 2023-11-05 DIAGNOSIS — J45991 Cough variant asthma: Secondary | ICD-10-CM

## 2023-11-05 LAB — POCT EXHALED NITRIC OXIDE: FeNO level (ppb): 13

## 2023-11-05 NOTE — Assessment & Plan Note (Addendum)
?   Onset 1990s - initial pulmonary eval 2005 [facility] (paper chart requested) - worse since 2018 rx symb 80 2bid and increased to 160 2bid  Oct 2020 - spacer added 12/22/2018 as has component of uacs > improved 01/19/2019 but not resolved - 01/19/2019  rec titrate gabapentin  to max of 300 qid to see if helps> never needed  - flared off ppi > restart 10/14/2020 > not maint on gerd rx 11/05/2022 but no symptoms > leave off  - FENO  11/05/2023  13 on symbicort  160 so no need to change rx   All goals of chronic asthma control met including optimal function and elimination of symptoms with minimal need for rescue therapy.  Contingencies discussed in full including contacting this office immediately if not controlling the symptoms using the rule of two's.     F/u yearly call sooner if needed          Each maintenance medication was reviewed in detail including emphasizing most importantly the difference between maintenance and prns and under what circumstances the prns are to be triggered using an action plan format where appropriate.  Total time for H and P, chart review, counseling, reviewing hfa  device(s) and generating customized AVS unique to this office visit / same day charting = 25 min

## 2023-11-05 NOTE — Progress Notes (Signed)
 Almarie JAYSON Hammersmith, female    DOB: [birth year],     MRN: [id number]   Brief patient profile:  4 yowf never smoker/NP for [facility]  with onset asthma around 1990 eval here around 2005 on prn saba rarely needed but esp in extremes of heat /cold but  not spring / fall and no assoc cough or need for maint  then around 2016 developed chest tightness and wheezing more of a chronic pattern improved p rx symb 80 2bid improved and rare saba then early 2020 noted more doe so around late Oct 2020 changed symb 160 and some better but still uncomfortable with deep breath so self referred back to pulmonary 12/22/2018 .   History of Present Illness  12/22/2018  Pulmonary/ 1st office eval/Alicha Raspberry  Chief Complaint  Patient presents with   Pulmonary Consult    Self referral- asthma- trouble taking a deep breath and wheezing. She is using her albuterol  inhaler daily.   Dyspnea:  Ex bike not using and limited by fibromyalgia/ plantar fasciitis/ ok with yard work and steps / some chest discomfort midline with deep breath but not with ex Cough: not a lot but finds herself throat clearing x 30 years   just her like her dad no mucus production and only happens while awake  Sleep: no resp problem SABA use: p symb 160 x 2 6 am  Typically feels needs saba w/in a few hours  No nasal symptoms  rec Plan A = Automatic = Always=    symbicort  160 Take 2 puffs first thing in am and then another 2 puffs about 12 hours later.  Work on inhaler technique: Plan B = Backup (to supplement plan A, not to replace it) Only use your albuterol  inhaler as a rescue medication  Pantoprazole  (protonix ) 40 mg   Take  30-60 min before first meal of the day and Pepcid  (famotidine )  20 mg one @  After supper   GERD diet televisit  01/19/19 rec: Add gabapentin  300 mg every morning to see if helps the daytime cough    11/05/2022  f/u ov/Jasira Robinson re: asthma   maint on symbicort  160  as the 80 did not as well  Chief Complaint  Patient presents  with   Follow-up    Doing well.  Dyspnea:  Not limited by breathing from desired activities  / wearing boot  Cough: none  Sleeping: 10-15 degrees electric one pillow no  resp cc  SABA use: none  02: none   Rec No change in medications    11/05/2023  Yearly  f/u ov/Lashandra Arauz re: asthma/uacs  maint on symbicort  160   Chief Complaint  Patient presents with   Asthma    Doing well.  Dyspnea:  limited L foot not breathing  Cough: dry throat daytime  Sleeping: 10-15 degrees s  resp cc  SABA use: rarely and usually when  outside     No obvious day to day or daytime variability or assoc excess/ purulent sputum or mucus plugs or hemoptysis or cp or chest tightness, subjective wheeze or overt sinus or hb symptoms.    Also denies any obvious fluctuation of symptoms with weather or environmental changes or other aggravating or alleviating factors except as outlined above   No unusual exposure hx or h/o childhood pna/ asthma or knowledge of premature birth.  Current Allergies, Complete Past Medical History, Past Surgical History, Family History, and Social History were reviewed in [employer] record.  ROS  The  following are not active complaints unless bolded Hoarseness, sore throat, dysphagia, dental problems, itching, sneezing,  nasal congestion or discharge of excess mucus or purulent secretions, ear ache,   fever, chills, sweats, unintended wt loss or wt gain, classically pleuritic or exertional cp,  orthopnea pnd or arm/hand swelling  or leg swelling, presyncope, palpitations, abdominal pain, anorexia, nausea, vomiting, diarrhea  or change in bowel habits or change in bladder habits, change in stools or change in urine, dysuria, hematuria,  rash, arthralgias, visual complaints, headache, numbness, weakness or ataxia or problems with walking or coordination,  change in mood or  memory.        Current Meds  Medication Sig   5-Hydroxytryptophan 100 MG CAPS 2 capsules every  day by oral route.   Acetaminophen  (TYLENOL  PO) Take 1,000 mg by mouth 2 (two) times daily.   albuterol  (VENTOLIN  HFA) 108 (90 Base) MCG/ACT inhaler Inhale 2 puffs into the lungs every 6 (six) hours as needed for wheezing or shortness of breath.   Biotin 5000 MCG CAPS Take 5,000 mcg by mouth daily.   budesonide -formoterol  (SYMBICORT ) 160-4.5 MCG/ACT inhaler Inhale 2 puffs into the lungs in the morning and at bedtime.   Calcium  Carbonate (CALCIUM  500 PO) Take 500 mg by mouth daily at 6 (six) AM.   Cholecalciferol (VITAMIN D3) 5000 units CAPS Take 7,000 Units by mouth once a week.   Coenzyme Q10 (COQ-10) 100 MG CAPS    diltiazem  (CARDIZEM  CD) 180 MG 24 hr capsule 1 capsule.   estradiol (ESTRACE) 0.1 MG/GM vaginal cream Insert 1 g twice a week by vaginal route as directed for 30 days.   ezetimibe  (ZETIA ) 10 MG tablet TAKE 1 TABLET BY MOUTH DAILY   gabapentin  (NEURONTIN ) 300 MG capsule TAKE 1 CAPSULE BY MOUTH AT BEDTIME   Glucosamine 500 MG TABS Take 1,500 mg by mouth daily.   levothyroxine  (SYNTHROID , LEVOTHROID) 50 MCG tablet 50 mcg 6 days weekly.   levothyroxine  (SYNTHROID , LEVOTHROID) 75 MCG tablet Take 75 mcg by mouth once a week.   LORazepam (ATIVAN) 0.5 MG tablet Take 0.5 mg by mouth every 8 (eight) hours as needed for anxiety.   Magnesium 500 MG CAPS Take 500 mg by mouth daily.   meloxicam  (MOBIC ) 15 MG tablet TAKE 1 TABLET BY MOUTH DAILY   methocarbamol  (ROBAXIN ) 750 MG tablet Take 1 tablet (750 mg total) by mouth at bedtime as needed for muscle spasms.   Misc Natural Products (TART CHERRY ADVANCED PO) Take by mouth.   mometasone  (ELOCON ) 0.1 % cream Apply 1 Application topically daily as needed (As needed).   rosuvastatin  (CRESTOR ) 20 MG tablet TAKE 1 TABLET BY MOUTH DAILY   zolpidem  (AMBIEN ) 10 MG tablet TAKE 1 TABLET BY MOUTH AT BEDTIME        Past Medical History:  Diagnosis Date   Anemia    Arthritis    Asthma    Cancer (HCC)    Hyperlipidemia    Neuromuscular disorder  (HCC)    Osteoporosis    Thyroid  disease         Objective:   Wts  11/05/2023        175   11/05/2022        167   10/20/2021        163  10/14/2020        155   10/03/19 150 lb 6.4 oz (68.2 kg)  09/20/19 151 lb 9.6 oz (68.8 kg)  09/18/19 153 lb 12.8 oz (69.8 kg)  Vital signs reviewed  11/05/2023  - Note at rest 02 sats  95% on RA    General appearance:    pleasant amb wf nad / occ throat clearing   HEENT : Oropharynx  clear      Nasal turbinates nl    NECK :  without  apparent JVD/ palpable Nodes/TM    LUNGS: no acc muscle use,  Nl contour chest which is clear to A and P bilaterally without cough on insp or exp maneuvers   CV:  RRR  no s3 or murmur or increase in P2, and no edema   ABD:  soft and nontender   MS:  Gait nl   ext warm without deformities Or obvious joint restrictions  calf tenderness, cyanosis or clubbing    SKIN: warm and dry without lesions    NEURO:  alert, approp, nl sensorium with  no motor or cerebellar deficits apparent.         Assessment   Assessment & Plan Cough variant asthma ? Onset 1990s - initial pulmonary eval 2005 [facility] (paper chart requested) - worse since 2018 rx symb 80 2bid and increased to 160 2bid  Oct 2020 - spacer added 12/22/2018 as has component of uacs > improved 01/19/2019 but not resolved - 01/19/2019  rec titrate gabapentin  to max of 300 qid to see if helps> never needed  - flared off ppi > restart 10/14/2020 > not maint on gerd rx 11/05/2022 but no symptoms > leave off  - FENO  11/05/2023  13 on symbicort  160 so no need to change rx   All goals of chronic asthma control met including optimal function and elimination of symptoms with minimal need for rescue therapy.  Contingencies discussed in full including contacting this office immediately if not controlling the symptoms using the rule of two's.     F/u yearly call sooner if needed          Each maintenance medication was reviewed in detail including emphasizing  most importantly the difference between maintenance and prns and under what circumstances the prns are to be triggered using an action plan format where appropriate.  Total time for H and P, chart review, counseling, reviewing hfa  device(s) and generating customized AVS unique to this office visit / same day charting = 25 min          AVS  Patient Instructions  No change in medications  Please schedule a follow up visit in  12  months but call sooner if needed    Ozell America, MD 11/05/2023

## 2023-11-05 NOTE — Patient Instructions (Signed)
No change in medications   Please schedule a follow up visit in 12  months but call sooner if needed  

## 2023-11-16 ENCOUNTER — Other Ambulatory Visit: Payer: Self-pay | Admitting: Rheumatology

## 2023-11-16 NOTE — Telephone Encounter (Signed)
 Last Fill: 10/18/2023  Next Visit: 01/17/2024  Last Visit: 07/13/2023  DX: Primary insomnia   Current Dose per office note on 07/13/2023: not discussed.   Okay to refill ambien ?

## 2023-11-17 ENCOUNTER — Other Ambulatory Visit: Payer: Self-pay | Admitting: *Deleted

## 2023-11-17 MED ORDER — MELOXICAM 15 MG PO TABS: 15.0000 mg | ORAL_TABLET | Freq: Every day | ORAL | 0 refills | Status: DC

## 2023-11-17 NOTE — Telephone Encounter (Signed)
 Patient contacted the office requesting a refill on Mobic .  Last Fill: 10/18/2023  Labs: 05/20/2023 RBC 4.12   MCH 33.2   Mono % 15.0   Mono # 0.9   HDLD 96   Chol/HDL Ratio 1.9   Next Visit: 01/17/2024  Last Visit: 07/13/2023  DX: Primary osteoarthritis of both hands   Current Dose per office note 07/13/2023: not discussed  Okay to refill Meloxicam ?

## 2023-11-18 DIAGNOSIS — Z79899 Other long term (current) drug therapy: Secondary | ICD-10-CM | POA: Diagnosis not present

## 2023-11-18 DIAGNOSIS — E039 Hypothyroidism, unspecified: Secondary | ICD-10-CM | POA: Diagnosis not present

## 2023-11-27 ENCOUNTER — Other Ambulatory Visit: Payer: Self-pay | Admitting: Cardiology

## 2023-11-29 DIAGNOSIS — M2040 Other hammer toe(s) (acquired), unspecified foot: Secondary | ICD-10-CM | POA: Diagnosis not present

## 2023-11-29 DIAGNOSIS — Z969 Presence of functional implant, unspecified: Secondary | ICD-10-CM | POA: Diagnosis not present

## 2023-11-30 DIAGNOSIS — M5416 Radiculopathy, lumbar region: Secondary | ICD-10-CM | POA: Diagnosis not present

## 2023-12-01 DIAGNOSIS — M810 Age-related osteoporosis without current pathological fracture: Secondary | ICD-10-CM | POA: Diagnosis not present

## 2023-12-01 DIAGNOSIS — F439 Reaction to severe stress, unspecified: Secondary | ICD-10-CM | POA: Diagnosis not present

## 2023-12-01 DIAGNOSIS — B0229 Other postherpetic nervous system involvement: Secondary | ICD-10-CM | POA: Diagnosis not present

## 2023-12-01 DIAGNOSIS — Z0000 Encounter for general adult medical examination without abnormal findings: Secondary | ICD-10-CM | POA: Diagnosis not present

## 2023-12-01 DIAGNOSIS — Z853 Personal history of malignant neoplasm of breast: Secondary | ICD-10-CM | POA: Diagnosis not present

## 2023-12-01 DIAGNOSIS — E039 Hypothyroidism, unspecified: Secondary | ICD-10-CM | POA: Diagnosis not present

## 2023-12-01 DIAGNOSIS — J45909 Unspecified asthma, uncomplicated: Secondary | ICD-10-CM | POA: Diagnosis not present

## 2023-12-01 DIAGNOSIS — R002 Palpitations: Secondary | ICD-10-CM | POA: Diagnosis not present

## 2023-12-01 DIAGNOSIS — E782 Mixed hyperlipidemia: Secondary | ICD-10-CM | POA: Diagnosis not present

## 2023-12-01 DIAGNOSIS — I2510 Atherosclerotic heart disease of native coronary artery without angina pectoris: Secondary | ICD-10-CM | POA: Diagnosis not present

## 2023-12-01 DIAGNOSIS — E559 Vitamin D deficiency, unspecified: Secondary | ICD-10-CM | POA: Diagnosis not present

## 2023-12-01 DIAGNOSIS — K219 Gastro-esophageal reflux disease without esophagitis: Secondary | ICD-10-CM | POA: Diagnosis not present

## 2023-12-08 DIAGNOSIS — M545 Low back pain, unspecified: Secondary | ICD-10-CM | POA: Diagnosis not present

## 2023-12-13 DIAGNOSIS — L820 Inflamed seborrheic keratosis: Secondary | ICD-10-CM | POA: Diagnosis not present

## 2023-12-13 DIAGNOSIS — D2262 Melanocytic nevi of left upper limb, including shoulder: Secondary | ICD-10-CM | POA: Diagnosis not present

## 2023-12-13 DIAGNOSIS — D1801 Hemangioma of skin and subcutaneous tissue: Secondary | ICD-10-CM | POA: Diagnosis not present

## 2023-12-13 DIAGNOSIS — Z85828 Personal history of other malignant neoplasm of skin: Secondary | ICD-10-CM | POA: Diagnosis not present

## 2023-12-13 DIAGNOSIS — L910 Hypertrophic scar: Secondary | ICD-10-CM | POA: Diagnosis not present

## 2023-12-13 DIAGNOSIS — D225 Melanocytic nevi of trunk: Secondary | ICD-10-CM | POA: Diagnosis not present

## 2023-12-13 DIAGNOSIS — L814 Other melanin hyperpigmentation: Secondary | ICD-10-CM | POA: Diagnosis not present

## 2023-12-13 DIAGNOSIS — D485 Neoplasm of uncertain behavior of skin: Secondary | ICD-10-CM | POA: Diagnosis not present

## 2023-12-13 DIAGNOSIS — L821 Other seborrheic keratosis: Secondary | ICD-10-CM | POA: Diagnosis not present

## 2023-12-15 ENCOUNTER — Other Ambulatory Visit: Payer: Self-pay | Admitting: Rheumatology

## 2023-12-15 DIAGNOSIS — H40013 Open angle with borderline findings, low risk, bilateral: Secondary | ICD-10-CM | POA: Diagnosis not present

## 2023-12-15 NOTE — Telephone Encounter (Signed)
 Last Fill: 11/17/2023 (meloxicam ), 11/16/2023 (ambien ) and 09/20/2023 (gabapentin )   Next Visit: 01/17/2024  Last Visit: 07/13/2023  DX: Fibromyalgia,  Primary insomnia  Current Dose per office note on 07/13/2023: methocarbamol  on as needed basis. She continues to take gabapentin  300 mg p.o. nightly.  Ambien  dose not mentioned.   Okay to refill gabapentin , ambien  and meloxicam ?

## 2023-12-22 DIAGNOSIS — Z85828 Personal history of other malignant neoplasm of skin: Secondary | ICD-10-CM | POA: Diagnosis not present

## 2023-12-22 DIAGNOSIS — L821 Other seborrheic keratosis: Secondary | ICD-10-CM | POA: Diagnosis not present

## 2023-12-22 DIAGNOSIS — D485 Neoplasm of uncertain behavior of skin: Secondary | ICD-10-CM | POA: Diagnosis not present

## 2023-12-22 DIAGNOSIS — L98499 Non-pressure chronic ulcer of skin of other sites with unspecified severity: Secondary | ICD-10-CM | POA: Diagnosis not present

## 2024-01-03 NOTE — Progress Notes (Unsigned)
 Office Visit Note  Patient: Michelle Gray             Date of Birth: [birth year]           MRN: [id number]             PCP: Aisha Harvey, MD Referring: Aisha Harvey, MD Visit Date: 01/17/2024 Occupation: Data Unavailable  Subjective:  No chief complaint on file.   History of Present Illness: Michelle Gray is a 68 y.o. female ***     Activities of Daily Living:  Patient reports morning stiffness for *** {minute/hour:19697}.   Patient {ACTIONS;DENIES/REPORTS:21021675::Denies} nocturnal pain.  Difficulty dressing/grooming: {ACTIONS;DENIES/REPORTS:21021675::Denies} Difficulty climbing stairs: {ACTIONS;DENIES/REPORTS:21021675::Denies} Difficulty getting out of chair: {ACTIONS;DENIES/REPORTS:21021675::Denies} Difficulty using hands for taps, buttons, cutlery, and/or writing: {ACTIONS;DENIES/REPORTS:21021675::Denies}  No Rheumatology ROS completed.   PMFS History:  Patient Active Problem List   Diagnosis Date Noted   Breast cancer, left breast (HCC) 02/11/2021   Cough variant asthma with ? component upper airway cough syndrome 12/23/2018   Primary osteoarthritis of both hands 02/21/2016   Fibromyalgia 02/21/2016   History of hypothyroidism 02/21/2016   Dyslipidemia 02/21/2016   History of breast cancer 02/21/2016   Age-related osteoporosis without current pathological fracture 02/21/2016   Hypothyroidism 08/06/2013   Other and unspecified hyperlipidemia 08/06/2013   Insomnia 08/06/2013   Intrinsic asthma 08/06/2013   Hip pain, left 01/12/2011   Plantar fasciitis 01/12/2011    Past Medical History:  Diagnosis Date   Arthritis    Asthma    Cancer (HCC) 1990   right breast ca-mast with reconstr   Cancer [facility]) 12/2020   left breast DCIS   Eczema    dx by derm per patient   Fibromyalgia    Hyperlipidemia    Hypothyroidism    Neuromuscular disorder (HCC)    Osteoporosis    PONV (postoperative nausea and vomiting)    PVC (premature ventricular  contraction)    Thyroid  disease     Family History  Problem Relation Age of Onset   Cancer Mother    Hyperlipidemia Mother    Hypertension Mother    Stroke Mother    Dementia Mother    Hyperlipidemia Father    Stroke Father    Prostate cancer Father    Cancer Father 48       Prostate   Hypertension Brother    Past Surgical History:  Procedure Laterality Date   APPENDECTOMY     ARTHRODESIS METATARSALPHALANGEAL JOINT (MTPJ) Left 10/15/2022   Procedure: ARTHRODESIS METATARSALPHALANGEAL JOINT (MTPJ);  Surgeon: Kit Rush, MD;  Location: [facility];  Service: Orthopedics;  Laterality: Left;   BREAST IMPLANT REMOVAL Right 02/11/2021   Procedure: REMOVAL RIGHT BREAST IMPLANT;  Surgeon: Arelia Filippo, MD;  Location: [facility];  Service: Plastics;  Laterality: Right;   BREAST SURGERY Right 1990   Mastectomy   CAPSULECTOMY Right 02/11/2021   Procedure: CAPSULECTOMY;  Surgeon: Arelia Filippo, MD;  Location: [facility];  Service: Plastics;  Laterality: Right;   FOOT SURGERY     LAPROSCOPIC     TOTAL MASTECTOMY Left 02/11/2021   Procedure: LEFT TOTAL MASTECTOMY;  Surgeon: Ebbie Cough, MD;  Location: [facility];  Service: General;  Laterality: Left;   WISDOM TEETH REMOVAL     Social History   Tobacco Use   Smoking status: Never    Passive exposure: Past   Smokeless tobacco: Never  Vaping Use   Vaping status: Never Used  Substance Use Topics  Alcohol use: Yes    Comment: social   Drug use: Never   Social History   Social History Narrative   Not on file     Immunization History  Administered Date(s) Administered   Influenza-Unspecified 10/04/2018   Moderna Covid-19 Fall Seasonal Vaccine 59yrs & older 05/11/2022   PFIZER(Purple Top)SARS-COV-2 Vaccination 01/31/2019, 02/21/2019, 11/04/2019, 05/18/2020   Pfizer(Comirnaty)Fall Seasonal Vaccine 12 years and older 12/08/2022, 05/26/2023, 10/18/2023      Objective: Vital Signs: There were no vitals taken for this visit.   Physical Exam   Musculoskeletal Exam: ***  CDAI Exam: CDAI Score: -- Patient Global: --; Provider Global: -- Swollen: --; Tender: -- Joint Exam 01/17/2024   No joint exam has been documented for this visit   There is currently no information documented on the homunculus. Go to the Rheumatology activity and complete the homunculus joint exam.  Investigation: No additional findings.  Imaging: No results found.  Recent Labs: Lab Results  Component Value Date   WBC 16.8 (H) 08/06/2013   HGB 13.4 08/06/2013   PLT 391 08/06/2013    Speciality Comments: No specialty comments available.  Procedures:  No procedures performed Allergies: Erythromycin, Prochlorperazine, Compazine, and Tape   Assessment / Plan:     Visit Diagnoses: No diagnosis found.  Orders: No orders of the defined types were placed in this encounter.  No orders of the defined types were placed in this encounter.   Face-to-face time spent with patient was *** minutes. Greater than 50% of time was spent in counseling and coordination of care.  Follow-Up Instructions: No follow-ups on file.   Daved JAYSON Gavel, CMA  Note - This record has been created using Animal nutritionist.  Chart creation errors have been sought, but may not always  have been located. Such creation errors do not reflect on  the standard of medical care.

## 2024-01-13 ENCOUNTER — Other Ambulatory Visit: Payer: Self-pay | Admitting: Rheumatology

## 2024-01-13 NOTE — Telephone Encounter (Signed)
 Last Fill: 12/15/2023  Next Visit: 01/17/2024  Last Visit: 07/13/2023  Dx: Fibromyalgia,  Primary insomnia   Current Dose per office note on 07/13/2023: not discussed  Okay to refill Ambien  and Mobic ?

## 2024-01-17 ENCOUNTER — Ambulatory Visit: Admitting: Cardiology

## 2024-01-17 ENCOUNTER — Encounter: Payer: Self-pay | Admitting: Rheumatology

## 2024-01-17 ENCOUNTER — Ambulatory Visit: Attending: Rheumatology | Admitting: Rheumatology

## 2024-01-17 VITALS — BP 128/74 | HR 79 | Ht 67.0 in | Wt 174.2 lb

## 2024-01-17 VITALS — BP 117/75 | HR 87 | Temp 97.3°F | Resp 16 | Ht 67.0 in | Wt 172.0 lb

## 2024-01-17 DIAGNOSIS — F5101 Primary insomnia: Secondary | ICD-10-CM

## 2024-01-17 DIAGNOSIS — M19042 Primary osteoarthritis, left hand: Secondary | ICD-10-CM | POA: Diagnosis present

## 2024-01-17 DIAGNOSIS — R002 Palpitations: Secondary | ICD-10-CM

## 2024-01-17 DIAGNOSIS — R5383 Other fatigue: Secondary | ICD-10-CM

## 2024-01-17 DIAGNOSIS — M8589 Other specified disorders of bone density and structure, multiple sites: Secondary | ICD-10-CM

## 2024-01-17 DIAGNOSIS — M47816 Spondylosis without myelopathy or radiculopathy, lumbar region: Secondary | ICD-10-CM | POA: Diagnosis present

## 2024-01-17 DIAGNOSIS — M797 Fibromyalgia: Secondary | ICD-10-CM

## 2024-01-17 DIAGNOSIS — M722 Plantar fascial fibromatosis: Secondary | ICD-10-CM

## 2024-01-17 DIAGNOSIS — M7062 Trochanteric bursitis, left hip: Secondary | ICD-10-CM | POA: Diagnosis not present

## 2024-01-17 DIAGNOSIS — M19041 Primary osteoarthritis, right hand: Secondary | ICD-10-CM

## 2024-01-17 DIAGNOSIS — Z8639 Personal history of other endocrine, nutritional and metabolic disease: Secondary | ICD-10-CM | POA: Diagnosis present

## 2024-01-17 DIAGNOSIS — M19071 Primary osteoarthritis, right ankle and foot: Secondary | ICD-10-CM | POA: Diagnosis present

## 2024-01-17 DIAGNOSIS — M19072 Primary osteoarthritis, left ankle and foot: Secondary | ICD-10-CM

## 2024-01-17 DIAGNOSIS — I2510 Atherosclerotic heart disease of native coronary artery without angina pectoris: Secondary | ICD-10-CM | POA: Diagnosis present

## 2024-01-17 DIAGNOSIS — I7300 Raynaud's syndrome without gangrene: Secondary | ICD-10-CM | POA: Insufficient documentation

## 2024-01-17 DIAGNOSIS — I493 Ventricular premature depolarization: Secondary | ICD-10-CM | POA: Diagnosis present

## 2024-01-17 DIAGNOSIS — Z853 Personal history of malignant neoplasm of breast: Secondary | ICD-10-CM | POA: Diagnosis not present

## 2024-01-17 DIAGNOSIS — E785 Hyperlipidemia, unspecified: Secondary | ICD-10-CM

## 2024-01-17 MED ORDER — DILTIAZEM HCL 30 MG PO TABS: 30.0000 mg | ORAL_TABLET | Freq: Every day | ORAL | 3 refills | Status: AC | PRN

## 2024-01-17 NOTE — Progress Notes (Signed)
 Cardiology Office Note:    Date:  01/17/2024   ID:  Michelle Gray, DOB 08-22-1955, MRN [id number]  PCP:  Aisha Harvey, MD  Cardiologist:  Lonni LITTIE Nanas, MD  Electrophysiologist:  None   Referring MD: Aisha Harvey, MD   Chief Complaint  Patient presents with   Palpitations    History of Present Illness:    Michelle Gray is a 68 y.o. female with a hx of fibromyalgia, asthma, hyperlipidemia, breast cancer, hypothyroidism who returns for follow-up.  She was initially seen on 12/08/2018, she was referred by Dr. Aisha for evaluation of palpitations and chest pain.    TTE was done on 12/20/2018, which showed normal LV systolic function, normal RV function, no significant valvular disease.  Cardiac monitor showed no significant arrhythmias, with the patient triggered events corresponding to sinus rhythm plus or minus PACs.  Coronary CTA was done on 01/14/2019, which showed nonobstructive CAD with calcified plaque in the proximal LAD causing minimal (0-24%) stenosis and noncalcified plaque in the proximal RCA causing minimal (0-24%) stenosis.  Calcium  score was 32 (73rd percentile for age/gender).  Zio patch x 7 days 01/2022 showed 2 episodes of NSVT, longest lasting 5 beats and 45 episodes of SVT with longest lasting 18.5 seconds and occasional PVCs (1.7% of beats).  Echocardiogram 02/20/2022 showed EF 55 to 60%, normal RV function, no significant valvular disease.  Zio patch x 3 days 08/2023 showed 1 episode of SVT lasting 20 beats, occasional PVCs (1.5% of beats).  Since last clinic visit, she reports she is doing okay.  She was taking 180 mg diltiazem  but was having dizziness in the morning and BP could be down to 90s over 50s.  She decreased her diltiazem  dose to 120 mg daily.  Reports improvement in dizziness but having more palpitations.  Does report some dyspnea that she attributes to her asthma.  Denies any chest pain.  She has not been exercising as recently underwent  procedure on her toe.   Past Medical History:  Diagnosis Date   Arthritis    Asthma    Cancer (HCC) 1990   right breast ca-mast with reconstr   Cancer [facility]) 12/2020   left breast DCIS   Eczema    dx by derm per patient   Fibromyalgia    Hyperlipidemia    Hypothyroidism    Neuromuscular disorder (HCC)    Osteoporosis    PONV (postoperative nausea and vomiting)    PVC (premature ventricular contraction)    Thyroid  disease     Past Surgical History:  Procedure Laterality Date   APPENDECTOMY     ARTHRODESIS METATARSALPHALANGEAL JOINT (MTPJ) Left 10/15/2022   Procedure: ARTHRODESIS METATARSALPHALANGEAL JOINT (MTPJ);  Surgeon: Kit Rush, MD;  Location: [facility];  Service: Orthopedics;  Laterality: Left;   BREAST IMPLANT REMOVAL Right 02/11/2021   Procedure: REMOVAL RIGHT BREAST IMPLANT;  Surgeon: Arelia Filippo, MD;  Location: [facility];  Service: Plastics;  Laterality: Right;   BREAST SURGERY Right 1990   Mastectomy   CAPSULECTOMY Right 02/11/2021   Procedure: CAPSULECTOMY;  Surgeon: Arelia Filippo, MD;  Location: [facility];  Service: Plastics;  Laterality: Right;   FOOT SURGERY     LAPROSCOPIC     TOTAL MASTECTOMY Left 02/11/2021   Procedure: LEFT TOTAL MASTECTOMY;  Surgeon: Ebbie Cough, MD;  Location: [facility];  Service: General;  Laterality: Left;   WISDOM TEETH REMOVAL      Current Medications: Current Meds  Medication  Sig   5-Hydroxytryptophan 100 MG CAPS 2 capsules every day by oral route.   Acetaminophen  (TYLENOL  PO) Take 1,000 mg by mouth 2 (two) times daily.   albuterol  (VENTOLIN  HFA) 108 (90 Base) MCG/ACT inhaler Inhale 2 puffs into the lungs every 6 (six) hours as needed for wheezing or shortness of breath.   Biotin 5000 MCG CAPS Take 5,000 mcg by mouth daily.   budesonide -formoterol  (SYMBICORT ) 160-4.5 MCG/ACT inhaler Inhale 2 puffs into the lungs in the morning and at bedtime.    Cholecalciferol (VITAMIN D3) 5000 units CAPS Take 7,000 Units by mouth once a week.   Coenzyme Q10 (COQ-10) 100 MG CAPS    diltiazem  (CARDIZEM  CD) 120 MG 24 hr capsule Take 120 mg by mouth.   diltiazem  (CARDIZEM ) 30 MG tablet Take 1 tablet (30 mg total) by mouth daily as needed (Palpations).   estradiol (ESTRACE) 0.1 MG/GM vaginal cream Insert 1 g twice a week by vaginal route as directed for 30 days.   ezetimibe  (ZETIA ) 10 MG tablet TAKE 1 TABLET BY MOUTH DAILY   gabapentin  (NEURONTIN ) 300 MG capsule TAKE 1 CAPSULE BY MOUTH AT BEDTIME   Glucosamine 500 MG TABS Take 1,500 mg by mouth daily.   levothyroxine  (SYNTHROID , LEVOTHROID) 50 MCG tablet 50 mcg 6 days weekly.   levothyroxine  (SYNTHROID , LEVOTHROID) 75 MCG tablet Take 75 mcg by mouth once a week.   LORazepam (ATIVAN) 0.5 MG tablet Take 0.5 mg by mouth every 8 (eight) hours as needed for anxiety.   Magnesium 500 MG CAPS Take 500 mg by mouth daily.   meloxicam  (MOBIC ) 15 MG tablet TAKE 1 TABLET BY MOUTH DAILY   methocarbamol  (ROBAXIN ) 750 MG tablet Take 1 tablet (750 mg total) by mouth at bedtime as needed for muscle spasms.   Misc Natural Products (TART CHERRY ADVANCED PO) Take by mouth.   mometasone  (ELOCON ) 0.1 % cream Apply 1 Application topically daily as needed (As needed).   rosuvastatin  (CRESTOR ) 20 MG tablet TAKE 1 TABLET BY MOUTH DAILY   zolpidem  (AMBIEN ) 10 MG tablet TAKE 1 TABLET BY MOUTH AT BEDTIME     Allergies:   Erythromycin, Prochlorperazine, Compazine, and Tape   Social History   Socioeconomic History   Marital status: Married    Spouse name: Not on file   Number of children: Not on file   Years of education: Not on file   Highest education level: Not on file  Occupational History   Not on file  Tobacco Use   Smoking status: Never    Passive exposure: Past   Smokeless tobacco: Never  Vaping Use   Vaping status: Never Used  Substance and Sexual Activity   Alcohol use: Yes    Comment: social   Drug use:  Never   Sexual activity: Not on file  Other Topics Concern   Not on file  Social History Narrative   Not on file   Social Drivers of Health   Tobacco Use: Low Risk (01/17/2024)   Patient History    Smoking Tobacco Use: Never    Smokeless Tobacco Use: Never    Passive Exposure: Past  Financial Resource Strain: Low Risk (09/15/2023)   Received from [facility]   Overall Financial Resource Strain (CARDIA)    How hard is it for you to pay for the very basics like food, housing, medical care, and heating?: Not hard at all  Food Insecurity: No Food Insecurity (09/15/2023)   Received from [facility]  Within the past 12 months, you worried that your food would run out before you got the money to buy more.: Never true    Within the past 12 months, the food you bought just didn't last and you didn't have money to get more.: Never true  Transportation Needs: No Transportation Needs (09/15/2023)   Received from [facility]    In the past 12 months, has lack of transportation kept you from medical appointments or from getting medications?: No    In the past 12 months, has lack of transportation kept you from meetings, work, or from getting things needed for daily living?: No  Physical Activity: Inactive (09/15/2023)   Received from [facility]   Exercise Vital Sign    On average, how many days per week do you engage in moderate to strenuous exercise (like a brisk walk)?: 0 days    Minutes of Exercise per Session: Not on file  Stress: No Stress Concern Present (09/15/2023)   Received from [facility] - Occupational Stress Questionnaire    Do you feel stress - tense, restless, nervous, or anxious, or unable to sleep at night because your mind is troubled all the time - these days?: Not at all  Social Connections: Socially Integrated (09/15/2023)   Received from [facility]   Social Network    How would you rate your social  network (family, work, friends)?: Good participation with social networks  Depression (PHQ2-9): Not on file  Alcohol Screen: Not on file  Housing: Low Risk (09/15/2023)   Received from [facility]    In the last 12 months, was there a time when you were not able to pay the mortgage or rent on time?: No    In the past 12 months, how many times have you moved where you were living?: 0    At any time in the past 12 months, were you homeless or living in a shelter (including now)?: No  Utilities: Not At Risk (09/15/2023)   Received from [facility]    In the past 12 months has the electric, gas, oil, or water company threatened to shut off services in your home?: No  Health Literacy: Not on file     Family History: The patient's family history includes Cancer in her mother; Cancer (age of onset: 76) in her father; Dementia in her mother; Hyperlipidemia in her father and mother; Hypertension in her brother and mother; Prostate cancer in her father; Stroke in her father and mother.  ROS:   Please see the history of present illness.    All other systems reviewed and are negative.  EKGs/Labs/Other Studies Reviewed:    The following studies were reviewed today:   EKG:   04/09/21: NSR with first degree AV block, LAD, Q waves in V1-3 (old) 04/06/22: NSR, rate 69, first degree AV block, iRBBB, Q waves in V1-3 (old), LAD 07/28/2023: Normal sinus rhythm with first-degree AV, rate 70, no ST abnormalities, incomplete right bundle branch block   Recent Labs: 05/20/2023: TSH 1.10  Recent Lipid Panel No results found for: CHOL, TRIG, HDL, CHOLHDL, VLDL, LDLCALC, LDLDIRECT   TTE 12/20/18:  1. Left ventricular ejection fraction, by visual estimation, is 60 to  65%. The left ventricle has normal function. There is no left ventricular  hypertrophy. Normal diastolic function.   2. Global right ventricle has normal systolic function.The right  ventricular size is normal.  No  increase in right ventricular wall  thickness.   3. Left atrial size was normal.   4. Right atrial size was normal.   5. Moderate thickening of the mitral valve leaflet(s).   6. The mitral valve is normal in structure. Trace mitral valve  regurgitation.   7. The tricuspid valve is normal in structure. Tricuspid valve  regurgitation is trivial.   8. The aortic valve was not well visualized. Aortic valve regurgitation  is not visualized. No evidence of aortic valve sclerosis or stenosis.   9. The pulmonic valve was not well visualized. Pulmonic valve  regurgitation is not visualized.  10. Mildly elevated pulmonary artery systolic pressure.  11. The inferior vena cava is normal in size with greater than 50%  respiratory variability, suggesting right atrial pressure of 3 mmHg.  12. The tricuspid regurgitant velocity is 2.31 m/s, and with an assumed  right atrial pressure of 10 mmHg, the estimated right ventricular systolic  pressure is mildly elevated at 31.3 mmHg.   CTA 01/14/19: 1. Coronary calcium  score of 32. This was 66 percentile for age and sex matched control. 2.  Normal coronary origin with right dominance. 3. Nonobstructive CAD, with calcified plaque in the proximal LAD causing minimal (0-24%) stenosis and noncalcified plaque in the proximal RCA causing minimal (0-24%) stenosis   CAD-RADS 1. Minimal non-obstructive CAD (0-24%). Consider non-atherosclerotic causes of chest pain. Consider preventive therapy and risk factor modification.  Noncardiac: IMPRESSION: 1.  Aortic Atherosclerosis (ICD10-I70.0).  Cardiac monitor 02/16/19: No significant arrhythmias Patient triggered events corresponded to sinus rhythm +/- PACs   Predominant rhythm is sinus rhythm. Range is 58-142 bpm with average of 78 bpm. No atrial fibrillation, sustained ventricular tachycardia, significant pause, or high degree AV block. Total ectopy <1%. 20 patient triggered events, corresponding to sinus rhythm   +/- PACs  No significant abnormalities.  Physical Exam:    VS:  BP 128/74   Pulse 79   Ht 5' 7 (1.702 m)   Wt 174 lb 3.2 oz (79 kg)   SpO2 97%   BMI 27.28 kg/m     Wt Readings from Last 3 Encounters:  01/17/24 174 lb 3.2 oz (79 kg)  01/17/24 172 lb (78 kg)  11/05/23 175 lb (79.4 kg)     GEN:  Well nourished, well developed in no acute distress HEENT: Normal NECK: No JVD; No carotid bruits CARDIAC: RRR, no murmurs, rubs, gallops RESPIRATORY:  Expiratory wheezing ABDOMEN: Soft, non-tender, non-distended MUSCULOSKELETAL:  No edema; No deformity  SKIN: Warm and dry NEUROLOGIC:  Alert and oriented x 3 PSYCHIATRIC:  Normal affect   ASSESSMENT:    1. Coronary artery disease involving native coronary artery of native heart without angina pectoris   2. Palpitations   3. PVC's (premature ventricular contractions)   4. Hyperlipidemia LDL goal <70      PLAN:    Coronary artery disease: Coronary CTA on 01/14/2019  showed nonobstructive CAD with calcified plaque in the proximal LAD causing minimal (0-24%) stenosis and noncalcified plaque in the proximal RCA causing minimal (0-24%) stenosis.  Calcium  score was 32 (73rd percentile for age/gender) -Rosuvastatin  increased to 40 mg daily, developed myalgias, so was decreased to 20 mg daily and Zetia  10 mg daily was added.  LDL 72 on 05/2023. Continue rosuvastatin  and Zetia .  Palpitations: Zio patch x 7 days 01/2022 showed 2 episodes of NSVT, longest lasting 5 beats and 45 episodes of SVT with longest lasting 18.5 seconds and occasional PVCs (1.7% of beats).  Echocardiogram 02/20/2022 showed EF 55 to 60%, normal RV function, no significant valvular disease.  Normal electrolytes and TSH on labs 03/2022.  Zio patch x 3 days 08/2023 showed 1 episode of SVT lasting 20 beats, occasional PVCs (1.5% of beats). -She had been on Toprol  for her palpitations, with description concerning for symptomatic PVCs.  Symptoms not improving with metoprolol , was  switched to diltiazem .  Currently on diltiazem  120 mg daily and stopped caffeine.  He did not tolerate diltiazem  180 mg daily due to dizziness and reports symptoms less controlled on 120 mg daily.  Will prescribe as needed 30 mg immediate release diltiazem  to help with symptoms  Hyperlipidemia: Continue rosuvastatin  20 mg daily and Zetia  10 mg daily,  LDL 72 on 05/2023  RTC in 6 months  Medication Adjustments/Labs and Tests Ordered: Current medicines are reviewed at length with the patient today.  Concerns regarding medicines are outlined above.  No orders of the defined types were placed in this encounter.  Meds ordered this encounter  Medications   diltiazem  (CARDIZEM ) 30 MG tablet    Sig: Take 1 tablet (30 mg total) by mouth daily as needed (Palpations).    Dispense:  90 tablet    Refill:  3    Patient Instructions  Medication Instructions:  Your physician has recommended you make the following change in your medication:  As needed: Diltiazem  30 mg once daily as needed for palpations  *If you need a refill on your cardiac medications before your next appointment, please call your pharmacy*  Follow-Up: At [facility], you and your health needs are our priority.  As part of our continuing mission to provide you with exceptional heart care, our providers are all part of one team.  This team includes your primary Cardiologist (physician) and Advanced Practice Providers or APPs (Physician Assistants and Nurse Practitioners) who all work together to provide you with the care you need, when you need it.  Your next appointment:   6 month(s)  Provider:   Lonni LITTIE Nanas, MD             Signed, Lonni LITTIE Nanas, MD  01/17/2024 2:49 PM    [facility]

## 2024-01-17 NOTE — Patient Instructions (Signed)
 Medication Instructions:  Your physician has recommended you make the following change in your medication:  As needed: Diltiazem  30 mg once daily as needed for palpations  *If you need a refill on your cardiac medications before your next appointment, please call your pharmacy*  Follow-Up: At [facility], you and your health needs are our priority.  As part of our continuing mission to provide you with exceptional heart care, our providers are all part of one team.  This team includes your primary Cardiologist (physician) and Advanced Practice Providers or APPs (Physician Assistants and Nurse Practitioners) who all work together to provide you with the care you need, when you need it.  Your next appointment:   6 month(s)  Provider:   Lonni LITTIE Nanas, MD

## 2024-02-13 ENCOUNTER — Other Ambulatory Visit: Payer: Self-pay | Admitting: Rheumatology

## 2024-02-14 NOTE — Telephone Encounter (Signed)
 Last Fill: 01/13/2024  Labs: November 18, 2023 CBC WBC 5.2, hemoglobin 13.8, platelets 328, CMP creatinine 0.85, GFR 74, AST 23, ALT 16, TSH 0.95   Next Visit: 07/18/2024  Last Visit: 01/17/2024  DX: Trochanteric bursitis of left hip Primary insomnia   Current Dose per office note 01/17/2024: meloxicam  15 mg p.o. daily. Ambien  10 mg p.o. nightly. Cutting Ambien  back to 5 mg p.o. nightly was discussed.   Okay to refill Meloxicam  and Ambien ?

## 2024-02-14 NOTE — Telephone Encounter (Signed)
 It appears Dr. Dolphus discussed reducing ambien  to 5 mg at bedtime-please change prescription accordingly.

## 2024-02-16 ENCOUNTER — Other Ambulatory Visit: Payer: Self-pay

## 2024-02-16 NOTE — Telephone Encounter (Signed)
 Patient contacted office stating she would like to stay on ambien  at 10 mg for the next 30-60 days due to surgery she just had. Patient stated that after that time she would decrease to 5 mg like her and Dr. Dolphus discussed.

## 2024-02-16 NOTE — Telephone Encounter (Signed)
 Okay to fill per patient's request when the prescription is due.

## 2024-02-17 ENCOUNTER — Encounter: Payer: Self-pay | Admitting: Internal Medicine

## 2024-02-17 DIAGNOSIS — J45991 Cough variant asthma: Secondary | ICD-10-CM

## 2024-02-18 MED ORDER — BUDESONIDE-FORMOTEROL FUMARATE 160-4.5 MCG/ACT IN AERO: 2.0000 | INHALATION_SPRAY | Freq: Two times a day (BID) | RESPIRATORY_TRACT | 3 refills | Status: AC

## 2024-02-29 MED ORDER — ZOLPIDEM TARTRATE 10 MG PO TABS: 10.0000 mg | ORAL_TABLET | Freq: Every day | ORAL | 0 refills | Status: AC

## 2024-02-29 NOTE — Addendum Note (Signed)
 Addended by: Teion Ballin P on: 02/29/2024 11:31 AM   Modules accepted: Orders

## 2024-02-29 NOTE — Telephone Encounter (Signed)
 Patient contacted the office and states she needs a refill of Ambien  to [facility] on [address]. Patient states she is still on Ambien  10 mg.   Last Fill: 02/14/2024 (was filled for 15 tablet for 5 mg daily, patient is still taking 10 mg daily)  Next Visit: 07/18/2024  Last Visit: 01/17/2024  Dx: Primary insomnia   Current Dose per office note on 01/17/2024: Ambien  10 mg p.o. nightly. Cutting Ambien  back to 5 mg p.o. nightly was discussed.   Patient states she is still taking the 10 mg for a couple months, changed prescription to reflect 10 mg daily. Please review and change if needed.   Okay to refill Ambien ?

## 2024-03-08 NOTE — Progress Notes (Unsigned)
 "  Office Visit Note  Patient: Michelle Gray             Date of Birth: Oct 01, 1955           MRN: [id number]             PCP: Aisha Harvey, MD Referring: Aisha Harvey, MD Visit Date: 03/09/2024 Occupation: Data Unavailable  Subjective:  No chief complaint on file.   History of Present Illness: Michelle Gray is a 69 y.o. female ***     Activities of Daily Living:  Patient reports morning stiffness for *** {minute/hour:19697}.   Patient {ACTIONS;DENIES/REPORTS:21021675::Denies} nocturnal pain.  Difficulty dressing/grooming: {ACTIONS;DENIES/REPORTS:21021675::Denies} Difficulty climbing stairs: {ACTIONS;DENIES/REPORTS:21021675::Denies} Difficulty getting out of chair: {ACTIONS;DENIES/REPORTS:21021675::Denies} Difficulty using hands for taps, buttons, cutlery, and/or writing: {ACTIONS;DENIES/REPORTS:21021675::Denies}  No Rheumatology ROS completed.   PMFS History:  Patient Active Problem List   Diagnosis Date Noted   Breast cancer, left breast (HCC) 02/11/2021   Cough variant asthma with ? component upper airway cough syndrome 12/23/2018   Primary osteoarthritis of both hands 02/21/2016   Fibromyalgia 02/21/2016   History of hypothyroidism 02/21/2016   Dyslipidemia 02/21/2016   History of breast cancer 02/21/2016   Age-related osteoporosis without current pathological fracture 02/21/2016   Hypothyroidism 08/06/2013   Other and unspecified hyperlipidemia 08/06/2013   Insomnia 08/06/2013   Intrinsic asthma 08/06/2013   Hip pain, left 01/12/2011   Plantar fasciitis 01/12/2011    Past Medical History:  Diagnosis Date   Arthritis    Asthma    Cancer (HCC) 1990   right breast ca-mast with reconstr   Cancer [facility]) 12/2020   left breast DCIS   Eczema    dx by derm per patient   Fibromyalgia    Hyperlipidemia    Hypothyroidism    Neuromuscular disorder (HCC)    Osteoporosis    PONV (postoperative nausea and vomiting)    PVC (premature ventricular  contraction)    Thyroid  disease     Family History  Problem Relation Age of Onset   Cancer Mother    Hyperlipidemia Mother    Hypertension Mother    Stroke Mother    Dementia Mother    Hyperlipidemia Father    Stroke Father    Prostate cancer Father    Cancer Father 74       Prostate   Hypertension Brother    Past Surgical History:  Procedure Laterality Date   APPENDECTOMY     ARTHRODESIS METATARSALPHALANGEAL JOINT (MTPJ) Left 10/15/2022   Procedure: ARTHRODESIS METATARSALPHALANGEAL JOINT (MTPJ);  Surgeon: Kit Rush, MD;  Location: [facility];  Service: Orthopedics;  Laterality: Left;   BREAST IMPLANT REMOVAL Right 02/11/2021   Procedure: REMOVAL RIGHT BREAST IMPLANT;  Surgeon: Arelia Filippo, MD;  Location: [facility];  Service: Plastics;  Laterality: Right;   BREAST SURGERY Right 1990   Mastectomy   CAPSULECTOMY Right 02/11/2021   Procedure: CAPSULECTOMY;  Surgeon: Arelia Filippo, MD;  Location: [facility];  Service: Plastics;  Laterality: Right;   FOOT SURGERY     LAPROSCOPIC     TOTAL MASTECTOMY Left 02/11/2021   Procedure: LEFT TOTAL MASTECTOMY;  Surgeon: Ebbie Cough, MD;  Location: [facility];  Service: General;  Laterality: Left;   WISDOM TEETH REMOVAL     Social History[1] Social History   Social History Narrative   Not on file     Immunization History  Administered Date(s) Administered   Influenza-Unspecified 10/04/2018   Moderna Covid-19 Fall Seasonal Vaccine  57yrs & older 05/11/2022   PFIZER(Purple Top)SARS-COV-2 Vaccination 01/31/2019, 02/21/2019, 11/04/2019, 05/18/2020   Pfizer(Comirnaty)Fall Seasonal Vaccine 12 years and older 12/08/2022, 05/26/2023, 10/18/2023     Objective: Vital Signs: There were no vitals taken for this visit.   Physical Exam   Musculoskeletal Exam: ***  CDAI Exam: CDAI Score: -- Patient Global: --; Provider Global: -- Swollen: --; Tender: -- Joint  Exam 03/09/2024   No joint exam has been documented for this visit   There is currently no information documented on the homunculus. Go to the Rheumatology activity and complete the homunculus joint exam.  Investigation: No additional findings.  Imaging: No results found.  Recent Labs: Lab Results  Component Value Date   WBC 16.8 (H) 08/06/2013   HGB 13.4 08/06/2013   PLT 391 08/06/2013    Speciality Comments: No specialty comments available.  Procedures:  No procedures performed Allergies: Erythromycin, Prochlorperazine, Compazine, and Tape   Assessment / Plan:     Visit Diagnoses: No diagnosis found.  Orders: No orders of the defined types were placed in this encounter.  No orders of the defined types were placed in this encounter.   Face-to-face time spent with patient was *** minutes. Greater than 50% of time was spent in counseling and coordination of care.  Follow-Up Instructions: No follow-ups on file.   Daved JAYSON Gavel, CMA  Note - This record has been created using Animal nutritionist.  Chart creation errors have been sought, but may not always  have been located. Such creation errors do not reflect on  the standard of medical care.    [1]  Social History Tobacco Use   Smoking status: Never    Passive exposure: Past   Smokeless tobacco: Never  Vaping Use   Vaping status: Never Used  Substance Use Topics   Alcohol use: Yes    Comment: social   Drug use: Never   "

## 2024-03-09 ENCOUNTER — Ambulatory Visit: Admitting: Rheumatology

## 2024-03-09 ENCOUNTER — Encounter: Payer: Self-pay | Admitting: Rheumatology

## 2024-03-09 VITALS — BP 121/75 | HR 73 | Temp 97.9°F | Resp 15 | Ht 66.5 in | Wt 174.8 lb

## 2024-03-09 DIAGNOSIS — M47816 Spondylosis without myelopathy or radiculopathy, lumbar region: Secondary | ICD-10-CM | POA: Diagnosis not present

## 2024-03-09 DIAGNOSIS — F5101 Primary insomnia: Secondary | ICD-10-CM | POA: Diagnosis not present

## 2024-03-09 DIAGNOSIS — M7062 Trochanteric bursitis, left hip: Secondary | ICD-10-CM | POA: Diagnosis not present

## 2024-03-09 DIAGNOSIS — M8589 Other specified disorders of bone density and structure, multiple sites: Secondary | ICD-10-CM

## 2024-03-09 DIAGNOSIS — M797 Fibromyalgia: Secondary | ICD-10-CM | POA: Diagnosis not present

## 2024-03-09 DIAGNOSIS — M19071 Primary osteoarthritis, right ankle and foot: Secondary | ICD-10-CM | POA: Diagnosis not present

## 2024-03-09 DIAGNOSIS — K2950 Unspecified chronic gastritis without bleeding: Secondary | ICD-10-CM

## 2024-03-09 DIAGNOSIS — M722 Plantar fascial fibromatosis: Secondary | ICD-10-CM

## 2024-03-09 DIAGNOSIS — I7300 Raynaud's syndrome without gangrene: Secondary | ICD-10-CM | POA: Diagnosis not present

## 2024-03-09 DIAGNOSIS — R5383 Other fatigue: Secondary | ICD-10-CM | POA: Diagnosis not present

## 2024-03-09 DIAGNOSIS — Z8639 Personal history of other endocrine, nutritional and metabolic disease: Secondary | ICD-10-CM | POA: Diagnosis not present

## 2024-03-09 DIAGNOSIS — Z853 Personal history of malignant neoplasm of breast: Secondary | ICD-10-CM

## 2024-03-09 DIAGNOSIS — M19072 Primary osteoarthritis, left ankle and foot: Secondary | ICD-10-CM

## 2024-03-09 DIAGNOSIS — M19041 Primary osteoarthritis, right hand: Secondary | ICD-10-CM

## 2024-03-09 DIAGNOSIS — M19042 Primary osteoarthritis, left hand: Secondary | ICD-10-CM

## 2024-03-09 MED ORDER — CELECOXIB 100 MG PO CAPS: 100.0000 mg | ORAL_CAPSULE | Freq: Two times a day (BID) | ORAL | 0 refills | Status: AC

## 2024-03-09 NOTE — Patient Instructions (Signed)
 Low Back Sprain or Strain Rehab Ask your health care provider which exercises are safe for you. Do exercises exactly as told by your health care provider and adjust them as directed. It is normal to feel mild stretching, pulling, tightness, or discomfort as you do these exercises. Stop right away if you feel sudden pain or your pain gets worse. Do not begin these exercises until told by your health care provider. Stretching and range-of-motion exercises These exercises warm up your muscles and joints and improve the movement and flexibility of your back. These exercises also help to relieve pain, numbness, and tingling. Lumbar rotation  Lie on your back on a firm bed or the floor with your knees bent. Straighten your arms out to your sides so each arm forms a 90-degree angle (right angle) with a side of your body. Slowly move (rotate) both of your knees to one side of your body until you feel a stretch in your lower back (lumbar). Try not to let your shoulders lift off the floor. Hold this position for __________ seconds. Tense your abdominal muscles and slowly move your knees back to the starting position. Repeat this exercise on the other side of your body. Repeat __________ times. Complete this exercise __________ times a day. Single knee to chest  Lie on your back on a firm bed or the floor with both legs straight. Bend one of your knees. Use your hands to move your knee up toward your chest until you feel a gentle stretch in your lower back and buttock. Hold your leg in this position by holding on to the front of your knee. Keep your other leg as straight as possible. Hold this position for __________ seconds. Slowly return to the starting position. Repeat with your other leg. Repeat __________ times. Complete this exercise __________ times a day. Prone extension on elbows  Lie on your abdomen on a firm bed or the floor (prone position). Prop yourself up on your elbows. Use your arms  to help lift your chest up until you feel a gentle stretch in your abdomen and your lower back. This will place some of your body weight on your elbows. If this is uncomfortable, try stacking pillows under your chest. Your hips should stay down, against the surface that you are lying on. Keep your hip and back muscles relaxed. Hold this position for __________ seconds. Slowly relax your upper body and return to the starting position. Repeat __________ times. Complete this exercise __________ times a day. Strengthening exercises These exercises build strength and endurance in your back. Endurance is the ability to use your muscles for a long time, even after they get tired. Pelvic tilt This exercise strengthens the muscles that lie deep in the abdomen. Lie on your back on a firm bed or the floor with your legs extended. Bend your knees so they are pointing toward the ceiling and your feet are flat on the floor. Tighten your lower abdominal muscles to press your lower back against the floor. This motion will tilt your pelvis so your tailbone points up toward the ceiling instead of pointing to your feet or the floor. To help with this exercise, you may place a small towel under your lower back and try to push your back into the towel. Hold this position for __________ seconds. Let your muscles relax completely before you repeat this exercise. Repeat __________ times. Complete this exercise __________ times a day. Alternating arm and leg raises  Get on your hands  and knees on a firm surface. If you are on a hard floor, you may want to use padding, such as an exercise mat, to cushion your knees. Line up your arms and legs. Your hands should be directly below your shoulders, and your knees should be directly below your hips. Lift your left leg behind you. At the same time, raise your right arm and straighten it in front of you. Do not lift your leg higher than your hip. Do not lift your arm higher  than your shoulder. Keep your abdominal and back muscles tight. Keep your hips facing the ground. Do not arch your back. Keep your balance carefully, and do not hold your breath. Hold this position for __________ seconds. Slowly return to the starting position. Repeat with your right leg and your left arm. Repeat __________ times. Complete this exercise __________ times a day. Abdominal set with straight leg raise  Lie on your back on a firm bed or the floor. Bend one of your knees and keep your other leg straight. Tense your abdominal muscles and lift your straight leg up, 4-6 inches (10-15 cm) off the ground. Keep your abdominal muscles tight and hold this position for __________ seconds. Do not hold your breath. Do not arch your back. Keep it flat against the ground. Keep your abdominal muscles tense as you slowly lower your leg back to the starting position. Repeat with your other leg. Repeat __________ times. Complete this exercise __________ times a day. Single leg lower with bent knees Lie on your back on a firm bed or the floor. Tense your abdominal muscles and lift your feet off the floor, one foot at a time, so your knees and hips are bent in 90-degree angles (right angles). Your knees should be over your hips and your lower legs should be parallel to the floor. Keeping your abdominal muscles tense and your knee bent, slowly lower one of your legs so your toe touches the ground. Lift your leg back up to return to the starting position. Do not hold your breath. Do not let your back arch. Keep your back flat against the ground. Repeat with your other leg. Repeat __________ times. Complete this exercise __________ times a day. Posture and body mechanics Good posture and healthy body mechanics can help to relieve stress in your body's tissues and joints. Body mechanics refers to the movements and positions of your body while you do your daily activities. Posture is part of body  mechanics. Good posture means: Your spine is in its natural S-curve position (neutral). Your shoulders are pulled back slightly. Your head is not tipped forward (neutral). Follow these guidelines to improve your posture and body mechanics in your everyday activities. Standing  When standing, keep your spine neutral and your feet about hip-width apart. Keep a slight bend in your knees. Your ears, shoulders, and hips should line up. When you do a task in which you stand in one place for a long time, place one foot up on a stable object that is 2-4 inches (5-10 cm) high, such as a footstool. This helps keep your spine neutral. Sitting  When sitting, keep your spine neutral and keep your feet flat on the floor. Use a footrest, if necessary, and keep your thighs parallel to the floor. Avoid rounding your shoulders, and avoid tilting your head forward. When working at a desk or a computer, keep your desk at a height where your hands are slightly lower than your elbows. Slide your  chair under your desk so you are close enough to maintain good posture. When working at a computer, place your monitor at a height where you are looking straight ahead and you do not have to tilt your head forward or downward to look at the screen. Resting When lying down and resting, avoid positions that are most painful for you. If you have pain with activities such as sitting, bending, stooping, or squatting, lie in a position in which your body does not bend very much. For example, avoid curling up on your side with your arms and knees near your chest (fetal position). If you have pain with activities such as standing for a long time or reaching with your arms, lie with your spine in a neutral position and bend your knees slightly. Try the following positions: Lying on your side with a pillow between your knees. Lying on your back with a pillow under your knees. Lifting  When lifting objects, keep your feet at least  shoulder-width apart and tighten your abdominal muscles. Bend your knees and hips and keep your spine neutral. It is important to lift using the strength of your legs, not your back. Do not lock your knees straight out. Always ask for help to lift heavy or awkward objects. This information is not intended to replace advice given to you by your health care provider. Make sure you discuss any questions you have with your health care provider. Document Revised: 05/25/2022 Document Reviewed: 04/08/2020 Elsevier Patient Education  2024 [employer].

## 2024-03-10 NOTE — Therapy (Incomplete)
 " OUTPATIENT PHYSICAL THERAPY THORACOLUMBAR EVALUATION   Patient Name: Michelle Gray MRN: [id number] DOB:1955-08-24, 69 y.o., female Today's Date: 03/10/2024  END OF SESSION:   Past Medical History:  Diagnosis Date   Arthritis    Asthma    Cancer (HCC) 1990   right breast ca-mast with reconstr   Cancer [facility]) 12/2020   left breast DCIS   Eczema    dx by derm per patient   Fibromyalgia    Hyperlipidemia    Hypothyroidism    Neuromuscular disorder (HCC)    Osteoporosis    PONV (postoperative nausea and vomiting)    PVC (premature ventricular contraction)    Thyroid  disease    Past Surgical History:  Procedure Laterality Date   APPENDECTOMY     ARTHRODESIS METATARSALPHALANGEAL JOINT (MTPJ) Left 10/15/2022   Procedure: ARTHRODESIS METATARSALPHALANGEAL JOINT (MTPJ);  Surgeon: Kit Rush, MD;  Location: [facility];  Service: Orthopedics;  Laterality: Left;   BREAST IMPLANT REMOVAL Right 02/11/2021   Procedure: REMOVAL RIGHT BREAST IMPLANT;  Surgeon: Arelia Filippo, MD;  Location: [facility];  Service: Plastics;  Laterality: Right;   BREAST SURGERY Right 1990   Mastectomy   CAPSULECTOMY Right 02/11/2021   Procedure: CAPSULECTOMY;  Surgeon: Arelia Filippo, MD;  [facility];  Service: Plastics;  Laterality: Right;   COLONOSCOPY  03/08/2024   FOOT SURGERY     HARDWARE REMOVAL Left 02/09/2024   great toe   LAPROSCOPIC     TOTAL MASTECTOMY Left 02/11/2021   Procedure: LEFT TOTAL MASTECTOMY;  Surgeon: Ebbie Cough, MD;  Location: [facility];  Service: General;  Laterality: Left;   UPPER GI ENDOSCOPY  03/08/2024   WISDOM TEETH REMOVAL     Patient Active Problem List   Diagnosis Date Noted   Breast cancer, left breast (HCC) 02/11/2021   Cough variant asthma with ? component upper airway cough syndrome 12/23/2018   Primary osteoarthritis of both hands 02/21/2016   Fibromyalgia 02/21/2016    History of hypothyroidism 02/21/2016   Dyslipidemia 02/21/2016   History of breast cancer 02/21/2016   Age-related osteoporosis without current pathological fracture 02/21/2016   Hypothyroidism 08/06/2013   Other and unspecified hyperlipidemia 08/06/2013   Insomnia 08/06/2013   Intrinsic asthma 08/06/2013   Hip pain, left 01/12/2011   Plantar fasciitis 01/12/2011    PCP: Aisha Harvey, MD    REFERRING PROVIDER: Dolphus Reiter, MD  REFERRING DIAG: [phone] (ICD-10-CM) - Spondylosis of lumbar spine  Rationale for Evaluation and Treatment: Rehabilitation  THERAPY DIAG:  No diagnosis found.  ONSET DATE: ***  SUBJECTIVE:  SUBJECTIVE STATEMENT: ***  PERTINENT HISTORY:  Hx bilateral breast cancer & mastectomy ; fribromyalgia; Osteoporosis;   PAIN:  Are you having pain? Yes: NPRS scale: *** Pain location: *** Pain description: *** Aggravating factors: *** Relieving factors: ***  PRECAUTIONS: {Therapy precautions:24002}  RED FLAGS: {PT Red Flags:29287}   WEIGHT BEARING RESTRICTIONS: {Yes ***/No:24003}  FALLS:  Has patient fallen in last 6 months? {fallsyesno:27318}  LIVING ENVIRONMENT: Lives with: {[facility] lives with:25569::lives with their family} Lives in: {Lives in:25570} Stairs: {opstairs:27293} Has following equipment at home: {Assistive devices:23999}  OCCUPATION: ***  PLOF: {PLOF:24004}  PATIENT GOALS: ***  NEXT MD VISIT: ***  OBJECTIVE:  Note: Objective measures were completed at Evaluation unless otherwise noted.  DIAGNOSTIC FINDINGS:  07/20/2006 Lumbar MRI IMPRESSION:  1. Negative for metastatic disease.   2. Degenerative disk disease L5-S1 without central nor foraminal stenosis.  3. Facet degenerative changes L4-5, L5-S1.   PATIENT SURVEYS:  {rehab  surveys:24030}  COGNITION: Overall cognitive status: {cognition:24006}     SENSATION: {sensation:27233}  MUSCLE LENGTH: Hamstrings: Right *** deg; Left *** deg Debby test: Right *** deg; Left *** deg  POSTURE: {posture:25561}  PALPATION: ***  LUMBAR ROM:   AROM eval  Flexion   Extension   Right lateral flexion   Left lateral flexion   Right rotation   Left rotation    (Blank rows = not tested)  LOWER EXTREMITY ROM:     {AROM/PROM:27142}  Right eval Left eval  Hip flexion    Hip extension    Hip abduction    Hip adduction    Hip internal rotation    Hip external rotation    Knee flexion    Knee extension    Ankle dorsiflexion    Ankle plantarflexion    Ankle inversion    Ankle eversion     (Blank rows = not tested)  LOWER EXTREMITY MMT:    MMT Right eval Left eval  Hip flexion    Hip extension    Hip abduction    Hip adduction    Hip internal rotation    Hip external rotation    Knee flexion    Knee extension    Ankle dorsiflexion    Ankle plantarflexion    Ankle inversion    Ankle eversion     (Blank rows = not tested)  LUMBAR SPECIAL TESTS:  {lumbar special test:25242}  FUNCTIONAL TESTS:  {Functional tests:24029}  GAIT: Distance walked: *** Assistive device utilized: {Assistive devices:23999} Level of assistance: {Levels of assistance:24026} Comments: ***  TREATMENT DATE: ***                                                                                                                                 PATIENT EDUCATION:  Education details: *** Person educated: {Person educated:25204} Education method: {Education Method:25205} Education comprehension: {Education Comprehension:25206}  HOME EXERCISE PROGRAM: ***  ASSESSMENT:  CLINICAL IMPRESSION: Patient is a *** y.o. *** who was seen  today for physical therapy evaluation and treatment for ***.   OBJECTIVE IMPAIRMENTS: {opptimpairments:25111}.   ACTIVITY LIMITATIONS:  {activitylimitations:27494}  PARTICIPATION LIMITATIONS: {participationrestrictions:25113}  PERSONAL FACTORS: {Personal factors:25162} are also affecting patient's functional outcome.   REHAB POTENTIAL: {rehabpotential:25112}  CLINICAL DECISION MAKING: {clinical decision making:25114}  EVALUATION COMPLEXITY: {Evaluation complexity:25115}   GOALS: Goals reviewed with patient? {yes/no:20286}  SHORT TERM GOALS: Target date: ***  *** Baseline: Goal status: INITIAL  2.  *** Baseline:  Goal status: INITIAL  3.  *** Baseline:  Goal status: INITIAL  4.  *** Baseline:  Goal status: INITIAL  5.  *** Baseline:  Goal status: INITIAL  6.  *** Baseline:  Goal status: INITIAL  LONG TERM GOALS: Target date: ***  *** Baseline:  Goal status: INITIAL  2.  *** Baseline:  Goal status: INITIAL  3.  *** Baseline:  Goal status: INITIAL  4.  *** Baseline:  Goal status: INITIAL  5.  *** Baseline:  Goal status: INITIAL  6.  *** Baseline:  Goal status: INITIAL  PLAN:  PT FREQUENCY: {rehab frequency:25116}  PT DURATION: {rehab duration:25117}  PLANNED INTERVENTIONS: {rehab planned interventions:25118::97110-Therapeutic exercises,97530- Therapeutic [phone]- Neuromuscular re-education,97535- Self Rjmz,02859- Manual therapy,Patient/Family education}.  PLAN FOR NEXT SESSION: PIERRETTE Kristeen Sar, PT 03/10/2024, 9:08 AM  "

## 2024-03-13 ENCOUNTER — Ambulatory Visit: Admitting: Physical Therapy

## 2024-07-18 ENCOUNTER — Ambulatory Visit: Admitting: Rheumatology
